# Patient Record
Sex: Male | Born: 1950 | Race: White | Hispanic: No | Marital: Married | Smoking: Never smoker
Health system: Southern US, Community
[De-identification: ages and names within clinical notes are randomized; demographics above are authoritative.]

## PROBLEM LIST (undated history)

## (undated) DIAGNOSIS — M1990 Unspecified osteoarthritis, unspecified site: Secondary | ICD-10-CM

## (undated) DIAGNOSIS — I10 Essential (primary) hypertension: Secondary | ICD-10-CM

## (undated) DIAGNOSIS — E119 Type 2 diabetes mellitus without complications: Secondary | ICD-10-CM

## (undated) DIAGNOSIS — K219 Gastro-esophageal reflux disease without esophagitis: Secondary | ICD-10-CM

## (undated) DIAGNOSIS — T508X5A Adverse effect of diagnostic agents, initial encounter: Secondary | ICD-10-CM

## (undated) DIAGNOSIS — I2510 Atherosclerotic heart disease of native coronary artery without angina pectoris: Secondary | ICD-10-CM

## (undated) DIAGNOSIS — H9311 Tinnitus, right ear: Secondary | ICD-10-CM

## (undated) DIAGNOSIS — E785 Hyperlipidemia, unspecified: Secondary | ICD-10-CM

## (undated) DIAGNOSIS — I219 Acute myocardial infarction, unspecified: Secondary | ICD-10-CM

## (undated) DIAGNOSIS — F319 Bipolar disorder, unspecified: Secondary | ICD-10-CM

## (undated) DIAGNOSIS — H9190 Unspecified hearing loss, unspecified ear: Secondary | ICD-10-CM

## (undated) HISTORY — DX: Unspecified osteoarthritis, unspecified site: M19.90

## (undated) HISTORY — DX: Essential (primary) hypertension: I10

## (undated) HISTORY — PX: TOTAL KNEE ARTHROPLASTY: SHX125

## (undated) HISTORY — DX: Atherosclerotic heart disease of native coronary artery without angina pectoris: I25.10

## (undated) HISTORY — PX: VASECTOMY: SHX75

## (undated) HISTORY — DX: Hyperlipidemia, unspecified: E78.5

## (undated) HISTORY — DX: Acute myocardial infarction, unspecified: I21.9

## (undated) HISTORY — DX: Adverse effect of diagnostic agents, initial encounter: T50.8X5A

## (undated) HISTORY — PX: JOINT REPLACEMENT: SHX530

---

## 2001-07-08 ENCOUNTER — Emergency Department (HOSPITAL_COMMUNITY): Admission: EM | Admit: 2001-07-08 | Discharge: 2001-07-08 | Payer: Self-pay | Admitting: Emergency Medicine

## 2001-07-08 ENCOUNTER — Encounter: Payer: Self-pay | Admitting: Emergency Medicine

## 2001-08-21 ENCOUNTER — Encounter: Payer: Self-pay | Admitting: Emergency Medicine

## 2001-08-22 ENCOUNTER — Inpatient Hospital Stay (HOSPITAL_COMMUNITY): Admission: EM | Admit: 2001-08-22 | Discharge: 2001-08-23 | Payer: Self-pay | Admitting: Emergency Medicine

## 2004-08-08 ENCOUNTER — Ambulatory Visit: Payer: Self-pay | Admitting: Internal Medicine

## 2005-08-17 ENCOUNTER — Ambulatory Visit (HOSPITAL_COMMUNITY): Admission: RE | Admit: 2005-08-17 | Discharge: 2005-08-17 | Payer: Self-pay | Admitting: Orthopaedic Surgery

## 2007-03-31 ENCOUNTER — Inpatient Hospital Stay (HOSPITAL_COMMUNITY): Admission: EM | Admit: 2007-03-31 | Discharge: 2007-04-03 | Payer: Self-pay | Admitting: Interventional Cardiology

## 2007-03-31 ENCOUNTER — Encounter: Payer: Self-pay | Admitting: Cardiology

## 2007-03-31 ENCOUNTER — Ambulatory Visit: Payer: Self-pay | Admitting: Cardiology

## 2007-04-03 ENCOUNTER — Encounter: Payer: Self-pay | Admitting: Cardiology

## 2008-03-16 ENCOUNTER — Inpatient Hospital Stay (HOSPITAL_COMMUNITY): Admission: RE | Admit: 2008-03-16 | Discharge: 2008-03-22 | Payer: Self-pay | Admitting: Orthopedic Surgery

## 2008-03-20 ENCOUNTER — Encounter (INDEPENDENT_AMBULATORY_CARE_PROVIDER_SITE_OTHER): Payer: Self-pay | Admitting: Orthopedic Surgery

## 2008-03-20 ENCOUNTER — Encounter: Payer: Self-pay | Admitting: Cardiology

## 2008-03-20 ENCOUNTER — Ambulatory Visit: Payer: Self-pay | Admitting: Vascular Surgery

## 2008-07-19 ENCOUNTER — Encounter: Payer: Self-pay | Admitting: Cardiology

## 2010-05-20 ENCOUNTER — Ambulatory Visit: Payer: Self-pay | Admitting: Internal Medicine

## 2010-05-29 ENCOUNTER — Encounter: Payer: Self-pay | Admitting: Cardiology

## 2010-05-29 ENCOUNTER — Emergency Department (HOSPITAL_COMMUNITY)
Admission: EM | Admit: 2010-05-29 | Discharge: 2010-05-29 | Payer: Self-pay | Source: Home / Self Care | Admitting: Emergency Medicine

## 2010-06-17 ENCOUNTER — Ambulatory Visit
Admission: RE | Admit: 2010-06-17 | Discharge: 2010-06-17 | Payer: Self-pay | Source: Home / Self Care | Attending: Internal Medicine | Admitting: Internal Medicine

## 2010-06-22 ENCOUNTER — Encounter: Payer: Self-pay | Admitting: Cardiology

## 2010-06-23 ENCOUNTER — Encounter: Payer: Self-pay | Admitting: Cardiology

## 2010-06-24 ENCOUNTER — Telehealth: Payer: Self-pay | Admitting: Cardiology

## 2010-07-03 ENCOUNTER — Telehealth (INDEPENDENT_AMBULATORY_CARE_PROVIDER_SITE_OTHER): Payer: Self-pay | Admitting: *Deleted

## 2010-07-03 NOTE — Cardiovascular Report (Signed)
Summary: Cardiac Catheterization  Cardiac Catheterization   Imported By: Dorise Hiss 06/24/2010 14:18:36  _____________________________________________________________________  External Attachment:    Type:   Image     Comment:   External Document

## 2010-07-03 NOTE — Letter (Signed)
Summary: [facility] H[facility]D/C DR. Beatrix Fetters [facility]  [facility]C DR. Beatrix Fetters [facility]   Imported By: Zachary George 06/24/2010 13:44:44  _____________________________________________________________________  External Attachment:    Type:   Image     Comment:   External Document

## 2010-07-03 NOTE — Letter (Signed)
Summary: Discharge Summary  Discharge Summary   Imported By: Dorise Hiss 06/24/2010 14:21:35  _____________________________________________________________________  External Attachment:    Type:   Image     Comment:   External Document

## 2010-07-03 NOTE — Consult Note (Signed)
Summary: Consultation Report/ CARDIOLOGY  Consultation Report/ CARDIOLOGY   Imported By: Dorise Hiss 06/24/2010 10:24:28  _____________________________________________________________________  External Attachment:    Type:   Image     Comment:   External Document

## 2010-07-03 NOTE — Progress Notes (Signed)
Summary: ASA question  Phone Note Call from Patient Call back at [facility] Phone [phone]   Summary of Call: Pt recently d/c'd from [facility]. He wanted to know if isosorbide was taking the place of Aspirin. Pt notified this would not take the place of Aspirin. He states he was on Aspirin 81mg  prior to going into the hospital. He states he was not told to continue this at d/c. He didn't know if he should take it or not since they don't know what's going on with him yet.   Records from [facility] will be scanned in for furthe review. Initial call taken by: Cyril Loosen, RN, BSN,  June 24, 2010 10:15 AM  Follow-up for Phone Call        Yes he should continue his prior dose of aspirin. He has known coronary artery disease. Follow-up by: Loreli Slot, MD, [facility],  June 24, 2010 10:29 AM     Appended Document: ASA question Pt notified and verbalized understanding.

## 2010-07-08 ENCOUNTER — Encounter: Payer: Self-pay | Admitting: Cardiology

## 2010-07-08 ENCOUNTER — Encounter (INDEPENDENT_AMBULATORY_CARE_PROVIDER_SITE_OTHER): Payer: Medicare Other | Admitting: Cardiology

## 2010-07-08 DIAGNOSIS — I10 Essential (primary) hypertension: Secondary | ICD-10-CM

## 2010-07-08 DIAGNOSIS — I2510 Atherosclerotic heart disease of native coronary artery without angina pectoris: Secondary | ICD-10-CM

## 2010-07-08 DIAGNOSIS — E782 Mixed hyperlipidemia: Secondary | ICD-10-CM

## 2010-07-09 NOTE — Progress Notes (Signed)
Summary: Med Question  Phone Note Call from Patient Call back at Home Phone [phone]   Summary of Call: Pt states he is going to have some [facility] and he would like to know if isosorbide is a blood thinner. Pt notified his Aspirin would be a blood thinner that the dentist may want him to hold. Pt verbalized understanding.  Initial call taken by: Cyril Loosen, RN, BSN,  July 03, 2010 2:43 PM   New Allergies: PCN * IVP DYE New/Updated Medications: TRAZODONE HCL 100 MG TABS (TRAZODONE HCL) Take 1 tablet by mouth at bedtime SINGULAIR 10 MG TABS (MONTELUKAST SODIUM) Take 1 tablet by mouth once a day. METOPROLOL TARTRATE 50 MG TABS (METOPROLOL TARTRATE) Take one tablet by mouth once daily. SIMVASTATIN 40 MG TABS (SIMVASTATIN) Take one tablet by mouth daily at bedtime XYZAL 5 MG TABS (LEVOCETIRIZINE DIHYDROCHLORIDE) Take 1 tab by mouth at bedtime ISOSORBIDE MONONITRATE CR 30 MG XR24H-TAB (ISOSORBIDE MONONITRATE) Take one tablet by mouth daily New Allergies: PCN * IVP DYE

## 2010-07-17 NOTE — Assessment & Plan Note (Signed)
Summary: eph d/c [facility] 1-24 chest pains   Visit Type:  Follow-up Primary Provider:  Dr. [name]   History of Present Illness: 60 year old male recently seen in consultation at [facility] in late January. He presented with chest pain and ruled out for myocardial infarction. Subsequent Cardiolite was performed, outlined below, and he was managed medically. He is establishing with our practice, previously followed by [facility] cardiology.  He is here with his wife today. He indicates no progressive chest pain. Reports compliance with his medications including Imdur. We reviewed his stress test results.  Today we discussed activity, diet and weight loss, also medication compliance.  Preventive Screening-Counseling & Management  Alcohol-Tobacco     Smoking Status: never  Current Medications (verified): 1)  Trazodone Hcl 100 Mg Tabs (Trazodone Hcl) .... Take 1 Tablet By Mouth At Bedtime 2)  Metoprolol Tartrate 50 Mg Tabs (Metoprolol Tartrate) .... Take One Tablet By Mouth Once Daily. 3)  Simvastatin 40 Mg Tabs (Simvastatin) .... Take One Tablet By Mouth Daily At Bedtime 4)  Xyzal 5 Mg Tabs (Levocetirizine Dihydrochloride) .... Take 1 Tab By Mouth At Bedtime 5)  Isosorbide Mononitrate Cr 30 Mg Xr24h-Tab (Isosorbide Mononitrate) .... Take One Tablet By Mouth Daily 6)  Aspir-Low 81 Mg Tbec (Aspirin) .... Take 1 Tablet By Mouth Once A Day 7)  Dexilant 60 Mg Cpdr (Dexlansoprazole) .... Take 1 Tablet By Mouth Once A Day 8)  Hydrocodone-Acetaminophen 5-325 Mg Tabs (Hydrocodone-Acetaminophen) .... Take 1 Tablet By Mouth Two Times A Day As Needed  Allergies (verified): 1)  Pcn 2)  * Ivp Dye  Comments:  Nurse/Medical Assistant: The patient's medication bottles and allergies were reviewed with the patient and were updated in the Medication and Allergy Lists.  Past History:  Past Medical History: Last updated: 06/24/2010 Hyperlipidemia Hypertension Osteoarthritis Myocardial Infarction - IMI  2008 CAD - BMS to RCA 2008, [facility], Dr. Katrinka Blazing Contrast dye allergy  Past Surgical History: Last updated: 06/24/2010 Vasectomy Left TKR  Family History: Last updated: 06/24/2010 Cardiovascular disease noted in both parents  Social History: Last updated: 06/24/2010 Disabled - arthritis Married  Tobacco Use - No.   Review of Systems       The patient complains of dyspnea on exertion.  The patient denies anorexia, fever, weight loss, chest pain, syncope, peripheral edema, prolonged cough, hemoptysis, melena, and hematochezia.         Reports some early morning "phlegm" production, no orthopnea or PND. Chronic problems with knee pain related to arthritis. Otherwise reviewed and negative except as outlined.  Vital Signs:  Patient profile:   60 year old male Height:      71 inches Weight:      254 pounds BMI:     35.55 Pulse rate:   83 / minute BP sitting:   133 / 79  (left arm) Cuff size:   large  Vitals Entered By: Carlye Grippe (July 08, 2010 11:03 AM)  Nutrition Counseling: Patient's BMI is greater than 25 and therefore counseled on weight management options.  Physical Exam  Additional Exam:  Morbidly obese male in no acute distress. HEENT: Conjunctiva and lids normal, oropharynx with poor dentition. Neck: Supple, no elevated JVP or bruits. Lungs: Clear to auscultation, diminished breath sounds, nonlabored. Cardiac: Regular rate aythm, soft S4, no significant murmur. Abdomen: Obese, nontender, bowel sounds present. Skin: Warm and dry. Extremities: Trace ankle edema, distal pulses one plus. Musculoskeletal: No kyphosis. Neuropsychiatric: Alert and oriented x3, affect appropriate.   Prior Report Reviewed for  Nuclear Study:  Findings: 06/23/2010 Lexiscan Cardiolite, no chest pain, no diagnostic ST segment changes, hypertensive response. Fixed inferobasal defect most consistent with diaphragmatic attenuation, LVEF 51%, t.i.d. ratio 1.1, no clear  evidence of ischemia.  Impression & Recommendations:  Problem # 1:  CORONARY ATHEROSCLEROSIS NATIVE CORONARY ARTERY (ICD-414.01)  Symptomatically stable at this point on medical therapy. Recent cardiac testing reviewed. Continue symptom observation, diet and weight loss recommended. Activity as tolerated with arthritic limitations. Followup in 6 months.  His updated medication list for this problem includes:    Metoprolol Tartrate 50 Mg Tabs (Metoprolol tartrate) .Marland Kitchen... Take one tablet by mouth once daily.    Isosorbide Mononitrate Cr 30 Mg Xr24h-tab (Isosorbide mononitrate) .Marland Kitchen... Take one tablet by mouth daily    Aspir-low 81 Mg Tbec (Aspirin) .Marland Kitchen... Take 1 tablet by mouth once a day  Problem # 2:  MORBID OBESITY (ICD-278.01)  Diet, exercise, and weight loss discussed.  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 Sherryll Burger. Recommend aggressive LDL control around 70.  His updated medication list for this problem includes:    Simvastatin 40 Mg Tabs (Simvastatin) .Marland Kitchen... Take one tablet by mouth daily at bedtime  Problem # 4:  ESSENTIAL HYPERTENSION, BENIGN (ICD-401.1)  No changes made in present medical regimen.  His updated medication list for this problem includes:    Metoprolol Tartrate 50 Mg Tabs (Metoprolol tartrate) .Marland Kitchen... Take one tablet by mouth once daily.    Aspir-low 81 Mg Tbec (Aspirin) .Marland Kitchen... Take 1 tablet by mouth once a day  Patient Instructions: 1)  Your physician recommends that you continue on your current medications as directed. Please refer to the Current Medication list given to you today. 2)  Follow up in  6 months

## 2010-08-11 LAB — COMPREHENSIVE METABOLIC PANEL
AST: 45 U/L — ABNORMAL HIGH (ref 0–37)
GFR calc Af Amer: >60 mL/min (ref >60)
Glucose, Bld: 104 mg/dL — ABNORMAL HIGH (ref 70–99)
Sodium: 141 mEq/L (ref 135–145)
Total Bilirubin: 0.6 mg/dL (ref 0.3–1.2)
Total Protein: 6.9 g/dL (ref 6.0–8.3)

## 2010-08-11 LAB — LIPASE, BLOOD: Lipase: 24 U/L (ref 11–59)

## 2010-08-11 LAB — CBC
HCT: 42.3 % (ref 39.0–52.0)
Hemoglobin: 14.8 g/dL (ref 13.0–17.0)
MCHC: 35 g/dL (ref 30.0–36.0)
MCV: 77.2 fL — ABNORMAL LOW (ref 78.0–100.0)
Platelets: 162 10*3/uL (ref 150–400)
RBC: 5.48 MIL/uL (ref 4.22–5.81)
RDW: 13.6 % (ref 11.5–15.5)

## 2010-08-11 LAB — DIFFERENTIAL
Eosinophils Absolute: 0 10*3/uL (ref 0.0–0.7)
Lymphs Abs: 1.7 10*3/uL (ref 0.7–4.0)
Monocytes Relative: 8 % (ref 3–12)

## 2010-10-14 NOTE — H&P (Signed)
NAME:  Michael Rose, Michael Rose NO.:  [id number]   MEDICAL RECORD NO.:  [id number]          PATIENT TYPE:  OBV   LOCATION:  2853                         FACILITY:  [facility]   PHYSICIAN:  Lyn Records, M.D.   DATE OF BIRTH:  May 03, 1951   DATE OF ADMISSION:  03/31/2007  DATE OF DISCHARGE:                              HISTORY & PHYSICAL   The patient is FULL CODE.   Patient was the historian.  He was a good historian.   CHIEF COMPLAINT:  He is being transferred from [facility] ER with chest  pain, possibly inferior MI.   HISTORY OF PRESENT ILLNESS:  Michael Rose is a 60 year old man with a  history of hypertension and gastroesophageal reflux disease with no  known coronary artery disease who noticed severe chest pain near 10/10  approximately 6:30 p.m. associated with shortness of breath, nausea and  vomiting, and radiation to the left arm, and he also had some  diaphoresis.  The patient thought it was reflux and did not seek medical  attention initially.  The chest pain continued, however, and his wife  took him to the ER in [place] apparently around 9 p.m.  On arrival,  patient had an EKG consistent possibly with an acute myocardial inferior  ST elevation and was given aspirin 325, Lopressor 5 IV x3, nitro drip,  and heparin bolus 5000 and a drip at 1000 units per hour.  The patient  had persistent chest pain in the ER with chest pain decreasing only to  the range of 6-7/10.  Patient was urgently transferred to [facility].  The patient notes persistent shortness of breath as well. He notes 7/10  chest pain at time of interview.   PAST MEDICAL HISTORY:  The patient has never had a prior cardiac  catheterization, unsure of the ejection fraction, never had a coronary  artery bypass grafting.  He has a history of:  1. Depression, not otherwise specified.  2. History of anxiety.  3. History of seasonal allergies.  4. History of hypertension.  5. A history of degenerative  joint disease with bilateral knee pain      resulting in disability.  6. A history of gastroesophageal reflux disease.  7. He is status post vasectomy.   MEDICATIONS:  He notes he is on pain medicines, but he is unsure of the  other medicines that he is on, and he is unsure of the pain medicine  type.   ALLERGIES:  He has  CONTRAST ALLERGY with a delayed rash.  PENICILLIN  causes a rash.   SOCIAL HISTORY:  The patient is married, currently disabled, a history  of tobacco abuse none currently.  He was employed at [employer].   FAMILY HISTORY:  Positive for coronary artery disease in the mother and  the father.   REVIEW OF SYSTEMS:  14-point review of systems negative unless it is  stated above.   PHYSICAL EXAMINATION:  VITAL SIGNS:  Blood pressure is 125/84 with a  pulse of 73, respiratory rate of 18, temperature 97.4 with saturations  100% on 2 L nasal  cannula.  GENERAL:  He is a male in mild acute distress.  HEENT:  Normocephalic, atraumatic.  His pupils are equal, round, and  reacted to light with extraocular movements intact.  His sclerae is  clear.  The oropharynx shows no posterior oropharyngeal lesions.  NECK:  Supple with no lymphadenopathy or thyromegaly.  No jugular venous  distention or bruits.  No anterior lymphadenopathy.  ABDOMEN:  Soft, nontender, nondistended with no hepatosplenomegaly.  LUNGS:  Clear to auscultation bilaterally with no wheezes or rhonchi.  There is poor inspiratory effort, however.  SKIN:  No rashes or lesions.  NEUROLOGIC EXAM:  He is alert and oriented x4.  Cranial nerves II  through XII grossly intact.  Strength and sensation grossly intact.  EXTREMITIES:  No clubbing, cyanosis, or edema.  Dorsalis pedis on  vascular exam are 2+ bilaterally.   LABORATORY DATA:  The outside hospital showed a white count of 12.9 with  a hemoglobin of 60,1, hematocrit of 46.7, platelets 262.  Chest x-ray  showed cephalization and poor inspiratory effort.   His EKG showed on 2  occasions, one on 2107 and 1922, heart rates of 65 and 74, respectively,  and normal sinus rhythm at normal axis, PR interval in the range of  140s, QRS in the 90s, and QT corrected in the 400s in the range of 410.  The patient had 3 to 4-mm ST elevations in II, III, and aVF with  reciprocal changes in I and aVL and also reciprocal changes in V1 to V2.  The patient's other labs are pending.   ASSESSMENT AND PLAN:  This is a patient with a history of tobacco abuse  and coronary artery disease family history who presented with an  inferior acute ST elevations with no signs of infarct as of yet with  persistent chest pain.  He is being currently set up for emergent  cardiac catheterization with possible coronary intervention.  He would  likely be on a 2b3a inhibitor, statin therapy, and beta blocker therapy,  and he will have an ejection fraction assessment with possible need for  an angiotensin-converting enzyme inhibitor.  The patient has  discontinued tobacco use and does have a history of tobacco use in the  past.  The patient will be monitored in the coronary care unit.      Darryl D. Prime, MD   Electronically Signed     ______________________________  Lyn Records, M.D.    DDP/MEDQ  D:  03/31/2007  T:  04/01/2007  Job:  [id number]

## 2010-10-14 NOTE — Cardiovascular Report (Signed)
NAME:  Michael Rose, Michael Rose NO.:  [id number]   MEDICAL RECORD NO.:  [id number]          PATIENT TYPE:  INP   LOCATION:  2910                         FACILITY:  [facility]   PHYSICIAN:  Lyn Records, M.D.   DATE OF BIRTH:  12-31-1950   DATE OF PROCEDURE:  03/31/2007  DATE OF DISCHARGE:                            CARDIAC CATHETERIZATION   This is a cardiac catheterization on emergency percutaneous intervention  procedure note.   INDICATIONS:  Acute inferior STEMI starting at 6:30 p.m. on the date of  admission.   PROCEDURES PERFORMED:  1. Left heart cath.  2. Selective coronary angio.  3. Left ventriculography.  4. PCI with a bare-metal stent mid right coronary.   DESCRIPTION:  After informed consent, the patient was brought directly  from the ambulance dock to the cath lab.  He had been transported from  [place].  He started having chest pain at 6:30 p.m.  In the cath lab, a  quick history was obtained.  It was identified that he had skin eruption  with contrast.  He was premedicated with IV Solu-Medrol 100 mg, 50 mg of  IV Benadryl, and 20 mg of IV Pepcid.   We then gave the patient instruction concern the procedure and he  consented to proceed.  After consent, we gave the patient some Versed  and fentanyl for pain control.   We then used 1% Xylocaine in the right femoral to obtain access in the  artery using the modified Seldinger technique.  A 6-French sheath was  placed.  A 6-French A2 multipurpose catheter was then used for  hemodynamic recordings, left ventriculography by hand injection,  selective left and right coronary angiography.  We identified the right  coronary as the acute culprit vessel.   Angiomax bolus and infusion was then begun.  A bolus was 0.75 mg/kg IV  over 2 minutes followed by the standard infusion of Angiomax.  The  patient was loaded with 600 mg of oral Plavix.  The patient already had  been given aspirin.   We then used a 6-French  right coronary guide catheter.  We crossed the  lesion with the wire quite easily.  We then used the Fetch catheter in  an attempt to perform mechanical thrombectomy.  Flow was established  after the thrombectomy catheter was advanced into the lesion and  removed.  One run was made.   We then ballooned the lesion with a 2.5 x 15 Maverick balloon after  documenting the ACT was greater than 200.  We then placed an 18 x 3.0  Vision chromium-cobalt stent and deployed to 15 atmospheres.  We  postdilated with a 3.5 x 15 Quantum to 14 atmospheres.  Two balloon  inflations were performed.  A nice angiographic result was obtained.  TIMI grade 3 flow was noted.   Immediately upon opening the vessel with the thrombectomy catheter, the  patient did develop Bezold-Jarisch reflex, for which 1 mg of IV atropine  was administered and IV fluid boluses were given to return this pressure  to around 100 systolic.   The case was  terminated.  Manual pressure will be used to achieve  hemostasis later this evening.  We will continue Angiomax for an  additional hour and half postprocedure while we await Plavix absorption  to give [place] reasonable antiplatelet activity.   RESULTS:  1. Hemodynamic data:      a.     The left ventricular pressure is 128/816.      b.     Aortic pressure 126/82.  2. Left ventriculography:  The left ventricular cavity is mildly      dilated.  Moderate inferior hypokinesis is noted.  EF is 55%.  3. Coronary angiography.      a.     Left main coronary:  Widely patent with minimal luminal       irregularities.      b.     Left anterior descending coronary:  This is a large vessel       that is ectatic and wraps around left ventricular apex.  It gives       origin to first diagonal that contains irregularities and a larger       second diagonal that supplies much of the anterolateral wall.       Minimal luminal irregularities are noted in the proximal and mid       LAD and diagonals  but no high-grade obstruction.      c.     Circumflex artery:  Gives origin to obtuse marginals.  The       second obtuse marginal is large and bifurcates.  Irregularities       are noted in the first and second marginals but no stenoses       greater than 50%.      d.     Right coronary:  The right coronary is large vessel that is       totally occluded in the mid vessel with TIMI grade 0 flow beyond       the total occlusion.  4. PCI report:  The right coronaries reduced from 100% to 0% with TIMI      grade 3 flow after placing a bare-metal Vision chromium-cobalt      stent and dilating it to 3.5 mm.  No complications occurred.  Post      procedure ACT was greater than 300 seconds.   CONCLUSION:  1. Acute inferior wall STEMI due to right coronary total occlusion.  2. Successful recanalization of the right coronary with reduction of      stenosis from 100% to 0%.  3. Luminal irregularities of the left coronary system.  4. Inferior wall moderate hypokinesis.   PLAN:  Aspirin, Plavix, statin, ACE inhibitor therapy will be continued,  potassium replacement will be given.      Lyn Records, M.D.  Electronically Signed     HWS/MEDQ  D:  03/31/2007  T:  04/01/2007  Job:  [id number]

## 2010-10-14 NOTE — Op Note (Signed)
NAME:  Michael Rose, Michael Rose NO.:  [id number]   MEDICAL RECORD NO.:  [id number]          PATIENT TYPE:  INP   LOCATION:  5029                         FACILITY:  [facility]   PHYSICIAN:  Dyke Brackett, M.D.    DATE OF BIRTH:  18-Jun-1950   DATE OF PROCEDURE:  03/16/2008  DATE OF DISCHARGE:                               OPERATIVE REPORT   INDICATIONS:  This is a 60 year old with severe varus arthrosis of the  left knee, thought to be amenable to total knee replacement.   PREOPERATIVE DIAGNOSIS:  Osteoarthritis, left knee.   POSTOPERATIVE DIAGNOSIS:  Osteoarthritis, left knee.   OPERATION:  Left total knee replacement (LCS large patella, large femur,  three-peg patella, size 4 tibia with 10-mm bearing.   SURGEON:  Dyke Brackett, MD   ASSISTANT:  Sharol Given, PA   TOURNIQUET TIME:  1 hour 30 minutes.   PROCEDURE:  Sterile prep and drape, exsanguination of leg and inflation  to 375, straight skin incision with medial parapatellar approach to the  knee.  Stripping of the medial compartment and medial side of the knee  was carried out due to a varus knee with a flexion contracture.  Tibia  was cut about 2 mm below the most diseased medial compartment followed  by an anterior-posterior femoral cut with flexion gap at 10 mm, distal  femoral 4-degree valgus cut followed by chamfer cuts in the keel hole  for the prosthesis.  Attention was next directed at the tibia.  The  flexion gap did match the extension gap at 10.  The tibia was sized to  be a size 4 tibia.  Small osteophytes were removed from the medial side  of the knee.  Carolin Guern[place] was cut for the tibia followed by placement of the  trial, tibia and femur.  The patella was cut leaving about 16 mm of  native patella with a three-peg trial.  Trial of the 10 tracked nicely.  There was extensive stripping of the medial side of the knee due to the  varus deformity, but with the medial sleeve intact with 2 towel clips.  There was  full extension, good stability, and flexion and no tendency  for bearings thin out.  Trial components were removed.  Final components  were inserted with exception of the bearing.  Trial bearing was placed  after the final components were inserted tibia femur and patella.  Excess cement was removed.  Trial bearing was removed.  Tourniquet was  released.  No excess bleeding was noted in the back of the knee.  No  excess cement was noted.  The final bearing was inserted 10 mm bearing.  Again, all parameters were deemed to be  acceptable was stability and range of motion, and the closure was  effected with #1 Ethilon 2-0 Vicryl and skin clips.  Hemovac drain was  placed exiting superolaterally, placed in lateral gutter and the patient  also had a preoperative femoral nerve block.      Dyke Brackett, M.D.  Electronically Signed     WDC/MEDQ  D:  03/16/2008  T:  03/16/2008  Job:  [id number]

## 2010-10-14 NOTE — Discharge Summary (Signed)
NAME:  Michael Rose, Michael Rose NO.:  [id number]   MEDICAL RECORD NO.:  [id number]          PATIENT TYPE:  INP   LOCATION:  5029                         FACILITY:  [facility]   PHYSICIAN:  Dyke Brackett, M.D.    DATE OF BIRTH:  12/17/1950   DATE OF ADMISSION:  03/16/2008  DATE OF DISCHARGE:                               DISCHARGE SUMMARY   ADMISSION DIAGNOSIS:  Left knee osteoarthritis.   FINAL DISCHARGE DIAGNOSES:  Left knee osteoarthritis status post left  total knee replacement.   PROCEDURE:  The patient was admitted to the hospital on March 16, 2008  with left knee osteoarthritis and taken to the operating room for a left  total knee replacement.  The procedure went well with no complications.  Estimated blood loss about 300 mL.  The patient was then transferred to  the PACU in stable condition and transferred up to the 5004.   HOSPITAL COURSE:  Postop day #1 was on March 17, 2008.  The patient's  pain controlled.  Temperature max of 100.  Vital signs were stable.  Patient doing well following left total knee replacement.  Postop day #2  was on March 18, 2008.  The patient's pain was still controlled.  Afebrile.  Vital signs were stable.  Hemoglobin was at 11.  Drain was  pulled.  The PCA pump was discharged.  The patient continued with  physical therapy.  Postoperative day #3 was on March 19, 2008.  The  patient's vital signs were stable and he was afebrile.  Hemoglobin 10.7,  hematocrit 31.2, white blood cell  8.3, platelets 150.  A BMP was within  normal limits.  The patient's plan is to go to skilled nursing facility,  working on that.  He is having some difficulty urinating, therefore we  ordered a urinalysis which did come back as negative.  Otherwise,  continued in CPM 0-90 at 6-8 hours per day.  Postoperative day #4 was on  March 20, 2008.  It was noted the patient had temperature max of 100.  Ordered a chest x-ray which came back negative for any signs  of a  pneumonia.  The patient was not having any cough. It is noted on exam  the patient having a fair amount of bruising and swelling of the left  lower extremity.  Therefore, we looked to get a left lower extremity  venous Doppler ultrasound to help out any other potential source of  fever.  Reviewed his knee incision on the left side and it was noted  there was some mild redness of a suture reaction.  No purulent drainage  and no signs of erythema or cellulitis.  Hemoglobin was 10.6, hematocrit  31.6, white count was 8.1 stable with 8.3 yesterday, platelets 182.  BMP  within normal limits, except for 122 for blood sugar.  Blood pressure  was 148/67.  Oxygen saturation 97% on room air.  Heart and lungs were  clear and regular rate and rhythm.  Lower extremity exam, neurovascular  intact.   Plan for discharge to skilled nursing facility pending negative Doppler  ultrasound.  Plan for discharge either March 20, 2008 if bed available  after negative Doppler ultrasound versus March 21, 2008.   ASSESSMENT/PLAN:  The patient is status post left total knee  replacement, postoperative day #4.  Plan for postoperative day #14  removal of staples.  The patient will stay at 50% weightbearing on that  left lower extremity at skilled nursing facility.  Plan to stay on  Lovenox for a total of 14 days postoperative.  He should be getting 40  mg subcutaneous at 8 a.m. each day.  The patient is in stable condition  on discharge.  The patient should have a follow up 2 weeks from now  outpatient in the office with Dyke Brackett, M.D.  The number is [phone] to make an appointment.   DISCHARGE MEDICATIONS AS FOLLOWS:  1. Percocet 5/325 mg one to two tablets every 4-6 hours p.r.n. pain.  2. Lovenox 40 mg subcutaneous at 8 a.m. each day with a plan for      needing another 11 doses from today's date, March 20, 2008.  3. Robaxin 500 mg one tab every 6-8 hours p.r.n. pain, muscle spasm.  4.  Metoprolol 50 mg one tab b.i.d.  5. Pravastatin 40 mg two tabs daily.  6. Trazodone 50 mg daily.  7. Aciphex 20 mg b.i.d.  8. Sublingual nitroglycerin p.r.n.   FINAL DIAGNOSES:  Left knee osteoarthritis status post left total knee  replacement.   Pending negative Doppler ultrasound plan discharge to skilled nursing  facility today of March 20, 2008.      Sharol Given, PA      Dyke Brackett, M.D.  Electronically Signed    JBS/MEDQ  D:  03/20/2008  T:  03/20/2008  Job:  [id number]

## 2010-10-17 NOTE — Consult Note (Signed)
[facility]  Patient:    Michael Rose, Michael Rose Visit Number: [id number] MRN: [id number]          Service Type: OBV Location: 3A [id number] 01 Attending Physician:  Michael Rose Dictated by:   Michael Rose, M.D. Proc. Date: 08/21/01 Admit Date:  08/21/2001   CC:         Michael Rose, Michael Rose, Michael Rose, Michael Rose.  Michael Rose, M.D.   Consultation Report  REASON FOR CONSULTATION:  Nausea, vomiting and diarrhea.  HISTORY OF PRESENT ILLNESS:  The patient is a pleasant 60 year old gentleman followed primarily by Michael Rose, [facility] Rose, Michael Rose, Michael Rose, who I have seen in consultation recently for gastroesophageal reflux disease. He was really doing very well until about 3 days prior to admission when he developed diffuse abdominal pain, nausea, vomiting, and diarrhea.  He has had numerous episodes of nonbloody, watery, diarrhea since August 19, 2001.  He has felt feverish but no frank chills or fever documented.  He says he felt terrible for the past 2 days.  He called me earlier this morning.  I recommended he go the [facility] emergency department for evaluation.  Because of his insurance status he felt it would be  better to go the [facility] emergency department.  He was evaluated by [facility], and called me for further evaluation.  In the emergency department earlier this morning he was noted to have diffuse abdominal pain and having multiple episodes of watery, nonbloody, diarrhea. Acute abdominal series demonstrated some dilated loops of small bowel with a nonspecific appearance.  [facility] was impressed with this diffuse abdominal pain and we decided to get an abdominal CT which revealed some thickening of the jejunal segment of small bowel, otherwise no significant abnormality.  The patient has not travelled anywhere recently.  No one in his family is ill. They went out to eat earlier in the week but he does not recall any  details, no unusual food intake.  [place] are on a city water [place].  He has never had a similar illness previously.  He generally moves his bowels daily or every other day.  LABORATORY EVALUATION:  Sodium 136, potassium 3.8, chloride 101, CO2 25, carbon dioxide 25, glucose 125, BUN 14, creatinine 0.9, calcium 9.0, total protein 6.9, albumin 3.5.  GGT and GGTP 18 and 19 respectively.  Alk. phos. 56, and total bilirubin 0.7.  Michael count 6.0, ______ 14.8 and 40.8.  MCV 76.5, platelet 233,000, 61% neutrophils, no eosinophils, 17% monocytes, slightly elevated.  Lipase 26.  Amylase 51.  In the emergency department he has been stable.  He received some Levsin sublingual 12.5 mg and Phenergan IV which was associated with marked improvement in his symptoms.  A fresh stool sample has been submitted to the lab for microbiology studies.  Again, the patient was seen by me earlier this year for "regurgitation" which was felt to be gastroesophageal reflux disease.  His symptoms dramatically improved on a course of aciphex 20 mg orally daily.  He underwent an EGD and screening colonoscopy on August 04, 2001.  Both of these examinations were normal.  Prior CT on July 25, 2001, demonstrated mild cardiomegaly, pulmonary vascular prominence, slight nodularity of the gallbladder which could be volume averaging (this study was done at [facility] on July 25, 2001).  PAST MEDICAL HISTORY 1. Significant for depression and anxiety and neurosis. 2. Seasonal allergies. 3. Hypertension.  PAST SURGICAL HISTORY:  Vasectomy.  CURRENT MEDICATIONS: 1. Aciphex 20  mg orally daily. 2. Paxil 20 mg 1/2 tablet daily. 3. Lisinopril 10 mg daily.  ALLERGIES: 1. PENICILLIN, rash. 2. ISO-B 300, HIVES/URTICARIA.  FAMILY HISTORY:  Father is in his 46s and he is hospitalized with heart problems, currently at [facility].  Mother has a history of heart disease.  No history of chronic GI illness, or  colorectal neoplasia.  SOCIAL HISTORY:  The patient has been married for 9 years. There are no children.  Employed with [employer].  No tobacco, no alcohol.  REVIEW OF SYSTEMS:  No chest pain, no dyspnea, no fever, no chills, no recent weight loss.  PHYSICAL EXAMINATION:  GENERAL:  Reveals a washed out appearing 60 year old gentleman somewhat disheveled, accompanied by his wife.  VITAL SIGNS:  Temperature 98.2, respiratory rate 20, blood pressure 107/73  SKIN:  Warm and dry and there is no jaundice.  Turgor is fair.  HEENT:  No scleral icterus.  Conjunctivae are pink.  Oral cavity:  No lesions. JVD is not prominent.  CHEST:  Lungs clear to auscultation.  CARDIAC:  Regular rate and rhythm without murmur, gallop, or rub.  ABDOMEN:  Full.  He has bowel sounds.  He has really minimal tenderness to palpation.  No appreciable mass or organomegaly.  EXTREMITIES:  No edema.  CVA TENDERNESS:  None.  IMPRESSION:  The patient is a pleasant 60 year old gentleman with an acute illness characterized by a 3 day history of nausea, vomiting, diarrhea, diffuse abdominal cramping pain.  His course thus far in the emergency department has been quite minimal and one of stability and improvement.  After receiving Levsin and Phenergan he is feeling much better.  However, I do feel that he is dehydrated and needs further Rose management of his nausea, vomiting and diarrhea.  RECOMMENDATIONS: 1. A 24-hour observation on my service. 2. IV fluid therapy. 3. IM antiemetics and sublingual Levsin as needed. 4. Clear liquid diet. 5. Follow up on stool studies. 6. Will get him IV protonix and recheck his electrolytes tomorrow morning.  The etiology of his symptoms are likely secondary to a food borne bacterial illness, although a viral illness is not excluded.   The scenario appears to be much less likely some other process such as mesenteric ischemia given his marked improvement with  sublingual Levsin and Phenergan.  Further recommendations to follow. Dictated by:   Michael Rose, M.D. Attending Physician:  Michael Rose DD:  08/21/01 TD:  08/22/01 Job: [id number]

## 2010-10-17 NOTE — Discharge Summary (Signed)
NAMEJAYVON, [name] NO.:  [id number]   MEDICAL RECORD NO.:  [id number]          PATIENT TYPE:  INP   LOCATION:  3701                         FACILITY:  [facility]   PHYSICIAN:  Lyn Records, M.D.   DATE OF BIRTH:  1950/12/01   DATE OF ADMISSION:  03/31/2007  DATE OF DISCHARGE:  04/03/2007                               DISCHARGE SUMMARY   DISCHARGE DIAGNOSES:  1. 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  2. Hypertension, treated.  3. Severe bilateral knee degenerative joint disease.  4. Gastroesophageal reflux disease.  5. History of depression.  6. History of anxiety.   HOSPITAL COURSE:  Michael Rose is a 60 year old male patient who was  transferred from [facility] emergency room with an acute inferior  myocardial infarction.  He developed chest pain at around 6:30 p.m. on  the evening of admission and did not go to the emergency room right  away.  He apparently waited 3 hours before he sought medical help.  He  was found to have ST-segment elevation in the inferior leads and was  brought to the cardiac catheterization lab emergently.  Through  documentation, it appears that he arrived at [facility] around 10:00  p.m. and was taken to the cath lab emergently.  He was found to have the  following:  Left main normal, LAD no obstructive disease, circumflex  nonobstructive, RCA with obstructed in the midvessel with no distal  flow.  Bare metal stent was placed to this lesion, resulting in TIMI III  flow.  He was watched in the hospital for the next several days and by  April 03, 2007, he was felt to be ready for discharge to home.  During  his hospitalization, lab work showed white count 12.1, which was felt to  be secondary to his myocardial infarction, hemoglobin 14.6, hematocrit  44.3, platelets 205, maximum CK 831 with an MB fraction of 143.2,  troponin of 11.70.  Total cholesterol 249,  triglycerides 120, HDL 54,  LDL 171.  The chart through my review does not show another 12-lead EKG,  but I do see the one from [facility] and this clearly shows ST-  segment elevation in the inferior leads, 3 mm.   DISCHARGE MEDICATIONS:  1. Plavix 75 mg a day.  2. Enteric-coated aspirin 325 mg a day.  3. Lisinopril 5 mg a day.  4. Metoprolol ER 25 mg a day.  5. Simvastatin 80 mg a day.  6. Sublingual nitroglycerin p.r.n. chest pain.   Remain on a low-sodium heart-healthy diet.  Increase activity slowly.  Cardiac rehabilitation instructions were provided to the patient.  The  patient is to follow up with Dr. Katrinka Blazing on April 15, 2007 at 10:00  a.m.  He is to call for any questions or concerns.      Michael Rose, P.A.      Lyn Records, M.D.  Electronically Signed    LB/MEDQ  D:  05/05/2007  T:  05/05/2007  Job:  [id number]   cc:  Francisca December, M.D.  Lyn Records, M.D.

## 2010-10-17 NOTE — Discharge Summary (Signed)
[facility]  Patient:    Michael Rose, Michael Rose Visit Number: [id number] MRN: [id number]          Service Type: MED Location: 3A A339 01 Attending Physician:  Jonathon Bellows Dictated by:   Tana Coast, P.A. Admit Date:  08/21/2001 Disc. Date: 08/23/01   CC:         Dr. Eldridge Dace, M.D.   Discharge Summary  ADMITTING DIAGNOSIS:  Nausea, vomiting and diarrhea.  DISCHARGE DIAGNOSES: 1. Acute gastroenteritis, possibly food born illness versus viral, resolved    with symptomatic therapy. 2. Low mean corpuscular volume in setting of normal hemoglobin and hematocrit.    Labs pending to rule out iron deficiency.  HISTORY OF PRESENT ILLNESS:  The patient is a 60 year old gentleman followed primarily by Dr. Clelia Croft at [facility] in [place], [place].  He was recently seen by Dr. [name] in the office for gastroesophageal reflux disease.  He was doing very well until three days prior to admission when he developed diffuse abdominal pain, nausea, vomiting and diarrhea.  He had numerous episodes of nonbloody, watery diarrhea for two days prior to admission.  He felt feverish, but there were no frank chills or fever documented.  He underwent an EGD and screening colonoscopy on August 04, 2001. Both these examinations were normal.  A CT on July 25, 2001, demonstrated mild cardiomegaly, pulmonary vascular prominence, slight nodularity of the gallbladder which could be volume averaging (this study was done at [facility]).  The patient was begun on Aciphex 20 mg daily which helped dramatically with his gastroesophageal reflux disease.  In the emergency department, he was noted to have diffuse abdominal pain and multiple episodes of water, nonbloody diarrhea.  Acute abdominal series demonstrated some dilated loops of small bowel with nonspecific appearance. Abdominal CT was also obtained which revealed some thickening of the  jejunal segment of the small bowel, but otherwise no significant abnormality.  The patient has not travelled anywhere recently.  No one in his family is ill.  He had eaten out earlier in the week, however, could not recall any details.  He is on city water system.  He has never had any similar illness previously.  He generally moves his bowels every other day.  HOSPITAL COURSE:  In the emergency department, he had acute abdominal series and CT as described above.  Laboratory data revealed a white count of 6.0, hemoglobin 14.8, hematocrit 40.8, MCV 76.5, platelets 233,00.  Sodium was 136, potassium 3.8, chloride 101, glucose 125, BUN 14, creatinine 0.9.  Total bilirubin 0.7, albumin 3.5, SGOT 18, SGPT 19, Alk phos 56, amylase 51, lipase 26.  Urinalysis was hazy and revealed trace ketones and trace protein with a few bacteria.  In the ED, he received some Levsin sublingual and Phenergan IV with marked improvement of his symptoms.  Stools were collected for studies. It was felt that he had some dehydration and needed further hospital management of his symptoms, therefore, he was initially placed on 24-hour observation.  He was maintained on Levsin sublingual, Phenergan 25 mg IM q.6h. p.r.n., IV normal saline, 20 mEq of Kay Ciel per liter at 150 cc per hour, Protonix 40 mg IV q.24h., Imodium 2 mg p.o. q.i.d. p.r.n., Claritin 10 mg p.o. q.d. and lisinopril 10 mg p.o. q.d.  The following morning, he had began to feel better, however had continued to have watery diarrhea through the early morning hours.  Nausea and vomiting had resolved.  He was  tolerating a diet, but felt that he needed to remain in the hospital at least for another day. He was therefore made a full admission.  He was suspected to have acute gastroenteritis secondary to food born versus viral illness.  His diet was advanced and his IV fluids were decreased.  On day #2 of hospitalization, the patient was feeling much better  except for mild morning nausea.  He was tolerating a full diet with no vomiting.  He had had no bowel movement for more than 36 hours.  He was feeling well and ready to go home.  Stool for C. difficile, O&P and WBC was negative.  Stool cultures were being reincubated for better growth.  Because of a low MCV, a fasting serum ferritin and transferrins were ordered, however, results were not available at the time of discharge.  DISCHARGE PHYSICAL EXAMINATION:  VITAL SIGNS:  Temperature 97.9, pulse 63, respiratory rate 20, blood pressure 112/74.  SKIN:  Warm and dry with no jaundice.  CHEST:  Lungs clear to auscultation.  CARDIAC:  Regular rate and rhythm with normal S1, S2 with no murmurs, rubs or gallops.  ABDOMEN:  Positive bowel sounds, soft, nontender, nondistended.  DISCHARGE LABORATORY DATA AND X-RAY FINDINGS:  As mentioned above, serum ferritin and transferrin are pending at the time of discharge.  CONDITION ON DISCHARGE:  Good.  DISPOSITION:  Discharged to home.  DISCHARGE MEDICATIONS: 1. Zyrtec 10 mg p.o. q.d. 2. Paxil 10 mg p.o. q.d. 3. Lisinopril 10 mg p.o. q.d. 4. Aciphex 20 mg p.o. 30 minutes before breakfast.  He has samples to hold him    until his next office visit. 5. Levsin sublingual q.a.c. and q.h.s. p.r.n. abdominal cramps.  Prescription    written for #120 with two refills.  DIET:  Resume low fat diet.  FOLLOWUP:  He will follow up with Dr. [name] as scheduled at the beginning of April.  SPECIAL INSTRUCTIONS:  He was advised to notify Dr. Clelia Croft or Dr. [name] if he has recurrent nausea, vomiting, diarrhea or abdominal pain. Dictated by:   Tana Coast, P.A. Attending Physician:  Jonathon Bellows DD:  08/23/01 TD:  08/24/01 Job: [id number]

## 2011-01-16 ENCOUNTER — Other Ambulatory Visit: Payer: Self-pay | Admitting: Physician Assistant

## 2011-01-29 ENCOUNTER — Encounter: Payer: Self-pay | Admitting: Cardiology

## 2011-01-30 ENCOUNTER — Telehealth: Payer: Self-pay | Admitting: *Deleted

## 2011-01-30 ENCOUNTER — Ambulatory Visit: Payer: Medicare Other | Admitting: Cardiology

## 2011-01-30 NOTE — Telephone Encounter (Signed)
Pt states he had an appt today but he didn't have any transportation.  He states he's been taking Imdur 30 mg daily. He states he has noticed his eyes are watering and he gets cold. He states this happens when he takes his Imdur and other medications at the same time. If he waits and takes the Imdur around noon, he doesn't have this problem.   Pt states he takes allergy shots. He states his allergist and pharmacist told him that they did not feel his medications were causing this. Pt feels it is caused by some combination of taking the Imdur w/his other medications.  Pt will try taking Imdur at 12 and other meds at 7 am to determine if he continues to have problems.   Today's appt was r/s to first available appt w/Dr. Diona Browner for Oct 26th.

## 2011-01-30 NOTE — Telephone Encounter (Signed)
Noted  

## 2011-02-20 NOTE — Telephone Encounter (Signed)
Pt states he feels like he needs to stop Imdur. He thinks this is causing him to have pain on his (R) side of his chest and neck and swelling in his legs. He states he didn't take it today or yesterday and feels like a totally different person today. Pt notified to stop Imdur if he chooses to do so. He is adamant that it is causing his symptoms. He stated several times during conversation that his allergy doctor told him his allergy shots were not causing his symptoms so he feels it has to be his Imdur. Pt states he will take NTG as needed and go to the ER if he has "real" chest pain.

## 2011-02-24 NOTE — Telephone Encounter (Signed)
Pt left message on voicemail asking for return call. Pt states he has been taking Imdur at night. He states he doesn't have pain at night but does in the mornings. He states he took the trash out this morning and had pain. He wants to know if he can be seen sooner than 10/26. He does state he did not use NTG for this episode b/c it only lasts about 15-20 mins and he doesn't feel it is bad enough to go to ER. Pt instructed to use NTG for chest pain. His 10/26 OV was r/s to 9/26 d/t a cancellation opening.

## 2011-02-25 ENCOUNTER — Encounter: Payer: Self-pay | Admitting: Cardiology

## 2011-02-25 ENCOUNTER — Ambulatory Visit (INDEPENDENT_AMBULATORY_CARE_PROVIDER_SITE_OTHER): Payer: Medicare Other | Admitting: Cardiology

## 2011-02-25 ENCOUNTER — Encounter: Payer: Self-pay | Admitting: *Deleted

## 2011-02-25 VITALS — BP 136/83 | HR 85 | Resp 18 | Ht 71.0 in | Wt 243.0 lb

## 2011-02-25 DIAGNOSIS — I2510 Atherosclerotic heart disease of native coronary artery without angina pectoris: Secondary | ICD-10-CM

## 2011-02-25 DIAGNOSIS — I10 Essential (primary) hypertension: Secondary | ICD-10-CM

## 2011-02-25 DIAGNOSIS — E782 Mixed hyperlipidemia: Secondary | ICD-10-CM

## 2011-02-25 DIAGNOSIS — Z79899 Other long term (current) drug therapy: Secondary | ICD-10-CM

## 2011-02-25 MED ORDER — ISOSORBIDE MONONITRATE ER 30 MG PO TB24: 30.0000 mg | ORAL_TABLET | Freq: Two times a day (BID) | ORAL | Status: DC

## 2011-02-25 NOTE — Assessment & Plan Note (Signed)
Continue present regimen with modifications noted above.

## 2011-02-25 NOTE — Assessment & Plan Note (Signed)
Patient is reporting chest pain symptoms that have typical and atypical features, almost seem more gastrointestinal in etiology based on his description. He is to continue on proton pump inhibitor. Concurrently I have asked him to take his Lopressor twice daily and will increase Imdur 30 mg p.o. B.i.d. Office follow up will be arranged over the next month, and if his symptoms do not abate, we may need to consider a cardiac catheterization. If symptoms worsen acutely, I have recommended that he present to the ER.

## 2011-02-25 NOTE — Assessment & Plan Note (Signed)
Due for followup fasting lipid profile and liver function tests. 

## 2011-02-25 NOTE — Patient Instructions (Signed)
Your physician recommends that you schedule a follow-up appointment on October 26th 2012 @2 :40pm with Dr. Diona Browner.  Your physician has recommended you make the following change in your medication: INCREASE ISOSORBIDE 30MG  TO TWICE DAILY. START TAKING YOUR METOPROLOL TWICE DAILY ALSO. A new prescription was sent to [facility] for your isosorbide.  Your physician recommends that you have FASTING BLOOD WORK AT THE [facility]. Make sure you don't eat or drink for 8 hours.

## 2011-02-25 NOTE — Progress Notes (Signed)
Clinical Summary Michael Rose is a 60 y.o.male presenting for followup. He was seen back in February, called in to be seen prior to his October visit. He reports recent episodes of recurrent chest pain, has not taken any nitroglycerin however. Symptoms have typical and atypical features, almost sound more gastrointestinal in etiology. He is however on a proton pump inhibitor, also Imdur for possible angina.  His Cardiolite from earlier in the year is noted below, overall low risk. He does have underlying CAD status post previous intervention as reviewed.  He has been taking his Lopressor only once daily. We discussed this as well as other medication adjustments with close office followup planned. We may need to pursue a cardiac catheterization if his symptoms do not abate.   Allergies  Allergen Reactions  . Penicillins     Medication list reviewed.  Past Medical History  Diagnosis Date  . Hyperlipidemia   . Hypertension   . Osteoarthritis   . Myocardial infarction     IMI 2008  . CAD (coronary artery disease)     BMS to RCA 2008, [facility] cardiology, Dr. Katrinka Blazing  . Allergic reaction to contrast dye     Past Surgical History  Procedure Date  . Vasectomy   . Total knee arthroplasty     Left    Family History  Problem Relation Age of Onset  . Heart disease Mother   . Heart disease Father     Social History [name] reports that he has never smoked. He does not have any smokeless tobacco history on file. [name] has no alcohol history on file.  Review of Systems As outlined above. Reports other symptoms including watery eyes, occasional sweats, sometimes feeling of increased sputum. Concerned about possible seasonal allergies. Otherwise reviewed and negative.  Physical Examination Filed Vitals:   02/25/11 1423  BP: 136/83  Pulse: 85  Resp: 18  Morbidly obese male in no acute distress.  HEENT: Conjunctiva and lids normal, oropharynx with poor dentition.  Neck:  Supple, no elevated JVP or bruits.  Lungs: Clear to auscultation, diminished breath sounds, nonlabored.  Cardiac: Regular rate aythm, soft S4, no significant murmur.  Abdomen: Obese, nontender, bowel sounds present.  Skin: Warm and dry.  Extremities: Trace ankle edema, distal pulses one plus.  Musculoskeletal: No kyphosis.  Neuropsychiatric: Alert and oriented x3, affect appropriate.    ECG Normal sinus rhythm with nonspecific T-wave changes.  Studies Lexiscan Cardiolite 06/23/2010: No chest pain, no diagnostic ST segment changes, hypertensive response. Fixed inferobasal defect consistent with diaphragmatic attenuation, LVEF 51%, TID ratio 1.1 without ischemia.   Problem List and Plan

## 2011-02-25 NOTE — Assessment & Plan Note (Signed)
Weight loss is indicated. 

## 2011-03-02 LAB — URINALYSIS, ROUTINE W REFLEX MICROSCOPIC
Bilirubin Urine: NEGATIVE
Bilirubin Urine: NEGATIVE
Glucose, UA: 100 — AB
Hgb urine dipstick: NEGATIVE
Hgb urine dipstick: NEGATIVE
Nitrite: NEGATIVE
Protein, ur: NEGATIVE
Specific Gravity, Urine: 1.015
Urobilinogen, UA: 1

## 2011-03-02 LAB — BASIC METABOLIC PANEL
BUN: 10
BUN: 17
CO2: 26
CO2: 27
CO2: 28
CO2: 28
Calcium: 8 — ABNORMAL LOW
Calcium: 8.1 — ABNORMAL LOW
Calcium: 8.3 — ABNORMAL LOW
Calcium: 8.3 — ABNORMAL LOW
Calcium: 8.4
Chloride: 104
Creatinine, Ser: 0.91
Creatinine, Ser: 0.97
Creatinine, Ser: 1.08
GFR calc Af Amer: >60
GFR calc Af Amer: >60
GFR calc Af Amer: >60
GFR calc Af Amer: >60
GFR calc Af Amer: >60
GFR calc non Af Amer: >60
GFR calc non Af Amer: >60
GFR calc non Af Amer: >60
GFR calc non Af Amer: >60
Glucose, Bld: 109 — ABNORMAL HIGH
Glucose, Bld: 110 — ABNORMAL HIGH
Glucose, Bld: 120 — ABNORMAL HIGH
Glucose, Bld: 122 — ABNORMAL HIGH
Glucose, Bld: 94
Potassium: 3.8
Potassium: 3.9
Sodium: 136
Sodium: 137
Sodium: 138
Sodium: 139

## 2011-03-02 LAB — DIFFERENTIAL
Basophils Absolute: 0
Basophils Relative: 1
Eosinophils Absolute: 0.1
Eosinophils Relative: 1
Lymphocytes Relative: 28
Lymphs Abs: 2
Monocytes Absolute: 0.7
Neutro Abs: 4.1
Neutrophils Relative %: 60

## 2011-03-02 LAB — CBC
HCT: 31.2 — ABNORMAL LOW
HCT: 31.6 — ABNORMAL LOW
Hemoglobin: 10.5 — ABNORMAL LOW
Hemoglobin: 10.6 — ABNORMAL LOW
Hemoglobin: 10.7 — ABNORMAL LOW
Hemoglobin: 10.8 — ABNORMAL LOW
Hemoglobin: 11.6 — ABNORMAL LOW
Hemoglobin: 15.2
MCHC: 33.3
MCHC: 33.6
MCHC: 33.6
MCHC: 34.3
MCV: 82.1
MCV: 82.2
Platelets: 171
Platelets: 213
RBC: 3.85 — ABNORMAL LOW
RBC: 3.85 — ABNORMAL LOW
RBC: 4.2 — ABNORMAL LOW
RBC: 4.3
RBC: 5.56
RDW: 14.4
RDW: 14.4
RDW: 14.5
RDW: 14.6
RDW: 14.7
RDW: 14.7
WBC: 7
WBC: 9.6

## 2011-03-02 LAB — PROTIME-INR
INR: 1
Prothrombin Time: 13.1

## 2011-03-02 LAB — URINE CULTURE

## 2011-03-02 LAB — COMPREHENSIVE METABOLIC PANEL
AST: 27
Albumin: 3.8
Alkaline Phosphatase: 60
Calcium: 9
GFR calc Af Amer: >60
Glucose, Bld: 110 — ABNORMAL HIGH
Potassium: 4
Total Bilirubin: 0.7

## 2011-03-02 LAB — URINE MICROSCOPIC-ADD ON

## 2011-03-02 LAB — APTT: aPTT: 27

## 2011-03-09 ENCOUNTER — Telehealth: Payer: Self-pay | Admitting: *Deleted

## 2011-03-09 ENCOUNTER — Other Ambulatory Visit: Payer: Self-pay | Admitting: *Deleted

## 2011-03-09 MED ORDER — ATORVASTATIN CALCIUM 40 MG PO TABS: 40.0000 mg | ORAL_TABLET | Freq: Every day | ORAL | Status: DC

## 2011-03-09 NOTE — Telephone Encounter (Signed)
RX sent in due to  Change from blood work. Pt notified.

## 2011-03-09 NOTE — Telephone Encounter (Signed)
Pt aware of lipid results RX sent into pharmacy.

## 2011-03-10 LAB — CBC
HCT: 44.3
Hemoglobin: 14.6
MCHC: 33
MCV: 82.7
Platelets: 205
RBC: 5.36
RDW: 13.5
WBC: 12.1 — ABNORMAL HIGH

## 2011-03-10 LAB — CK TOTAL AND CKMB (NOT AT ARMC)
CK, MB: 56.9 — ABNORMAL HIGH
Relative Index: 11.5 — ABNORMAL HIGH
Total CK: 494 — ABNORMAL HIGH

## 2011-03-10 LAB — BASIC METABOLIC PANEL
BUN: 19
CO2: 27
Calcium: 9
Chloride: 105
Creatinine, Ser: 1.02
GFR calc Af Amer: >60
GFR calc non Af Amer: >60
Glucose, Bld: 98
Potassium: 4
Sodium: 138

## 2011-03-11 LAB — CARDIAC PANEL(CRET KIN+CKTOT+MB+TROPI)
CK, MB: 143.2 — ABNORMAL HIGH
Relative Index: 17.2 — ABNORMAL HIGH
Total CK: 831 — ABNORMAL HIGH
Troponin I: 11.7

## 2011-03-11 LAB — CBC
HCT: 43.9
Hemoglobin: 14.6
MCHC: 33.3
MCV: 81.3
Platelets: 216
RBC: 5.4
RDW: 13.8
WBC: 7.7

## 2011-03-11 LAB — LIPID PANEL
Cholesterol: 249 — ABNORMAL HIGH
HDL: 54
LDL Cholesterol: 171 — ABNORMAL HIGH
Total CHOL/HDL Ratio: 4.6
Triglycerides: 120
VLDL: 24

## 2011-03-11 LAB — BASIC METABOLIC PANEL
BUN: 14
CO2: 20
Calcium: 8.8
Chloride: 107
Creatinine, Ser: 0.98
GFR calc Af Amer: >60
GFR calc non Af Amer: >60
Glucose, Bld: 146 — ABNORMAL HIGH
Potassium: 4.4
Sodium: 136

## 2011-03-11 LAB — CK TOTAL AND CKMB (NOT AT ARMC)
CK, MB: 101.8 — ABNORMAL HIGH
CK, MB: 91.4 — ABNORMAL HIGH
Relative Index: 13.6 — ABNORMAL HIGH
Relative Index: 16.3 — ABNORMAL HIGH
Total CK: 626 — ABNORMAL HIGH
Total CK: 671 — ABNORMAL HIGH

## 2011-03-20 ENCOUNTER — Encounter: Payer: Self-pay | Admitting: Cardiology

## 2011-03-27 ENCOUNTER — Ambulatory Visit: Payer: Medicare Other | Admitting: Cardiology

## 2011-03-27 ENCOUNTER — Encounter: Payer: Self-pay | Admitting: Cardiology

## 2011-03-27 ENCOUNTER — Ambulatory Visit (INDEPENDENT_AMBULATORY_CARE_PROVIDER_SITE_OTHER): Payer: Medicare Other | Admitting: Cardiology

## 2011-03-27 VITALS — BP 115/74 | HR 80 | Ht 71.0 in | Wt 239.0 lb

## 2011-03-27 DIAGNOSIS — I2510 Atherosclerotic heart disease of native coronary artery without angina pectoris: Secondary | ICD-10-CM

## 2011-03-27 DIAGNOSIS — E782 Mixed hyperlipidemia: Secondary | ICD-10-CM

## 2011-03-27 DIAGNOSIS — I10 Essential (primary) hypertension: Secondary | ICD-10-CM

## 2011-03-27 NOTE — Assessment & Plan Note (Signed)
Symptomatically stable, improved in fact, on medical therapy. Continue observation for now. Followup arranged.

## 2011-03-27 NOTE — Patient Instructions (Signed)
Your physician wants you to follow-up in: 3 months. You will receive a reminder letter in the mail one-two months in advance. If you don't receive a letter, please call our office to schedule the follow-up appointment. Your physician recommends that you continue on your current medications as directed. Please refer to the Current Medication list given to you today. Your physician recommends that you go to the [facility] for a FASTING lipid profile and liver function labs. Do not eat or drink after midnight. DO LABS IN 3 MONTHS BEFORE OFFICE VISIT.

## 2011-03-27 NOTE — Progress Notes (Signed)
Clinical Summary Michael Rose is a 60 y.o.male presenting for followup. He was seen in late September. Medication adjustments were made at that visit related to chest pain symptoms.  He reports significant improvement since that time. Overall he is pleased with his progress. Reports fewer problems with allergies, cough and congestion as well.  We discussed continuing medical therapy and observation at this time, reserving cardiac catheterization for progressive symptoms on optimal medical therapy.   Allergies  Allergen Reactions  . Iodinated Diagnostic Agents   . Penicillins     Medication list reviewed.  Past Medical History  Diagnosis Date  . Hyperlipidemia   . Hypertension   . Osteoarthritis   . Myocardial infarction     IMI 2008  . CAD (coronary artery disease)     BMS to RCA 2008, [facility] cardiology, Dr. Katrinka Blazing  . Allergic reaction to contrast dye     Past Surgical History  Procedure Date  . Vasectomy   . Total knee arthroplasty     Left    Family History  Problem Relation Age of Onset  . Heart disease Mother   . Heart disease Father     Social History Michael Rose reports that he has never smoked. He has never used smokeless tobacco. Michael Rose has no alcohol history on file.  Review of Systems Otherwise reviewed and negative.  Physical Examination Filed Vitals:   03/27/11 1430  BP: 115/74  Pulse: 80    Morbidly obese male in no acute distress.  HEENT: Conjunctiva and lids normal, oropharynx with poor dentition.  Neck: Supple, no elevated JVP or bruits.  Lungs: Clear to auscultation, diminished breath sounds, nonlabored.  Cardiac: Regular rate aythm, soft S4, no significant murmur.  Abdomen: Obese, nontender, bowel sounds present.  Skin: Warm and dry.  Extremities: Trace ankle edema, distal pulses one plus.  Musculoskeletal: No kyphosis.  Neuropsychiatric: Alert and oriented x3, affect appropriate.    Problem List and Plan

## 2011-03-27 NOTE — Assessment & Plan Note (Signed)
Blood pressure well controlled

## 2011-03-27 NOTE — Assessment & Plan Note (Signed)
Followup fasting lipid profile and liver function tests for next visit. 

## 2011-04-27 ENCOUNTER — Telehealth: Payer: Self-pay | Admitting: *Deleted

## 2011-04-27 NOTE — Telephone Encounter (Signed)
Isosorbide mononitrate was increased and patient has started having a lot of mucous and congestion in the mornings, patient wanted to know if this could be causing this problem. Nurse advised patient that increased mucous wasn't a usual side effect of imdur and MD would be informed.

## 2011-04-28 ENCOUNTER — Telehealth: Payer: Self-pay | Admitting: *Deleted

## 2011-04-28 NOTE — Telephone Encounter (Signed)
Patient denies having dizziness,sob or lightheadedness when BP is running lower. Patient stated that he takes his BP about 2 hours after taking his medications,in which he takes all at the same time in the mornings. Patient said it doesn't always run 90'/60's, that it happened 1-2 times and he didn't have any symptoms with it. Patient stated that yesterday at 4pm his BP was 107/66 left arm and 101/67 in right arm. Patient said this morning his BP was 124/85 before taking medications. Nurse advised patient that if he feels symptoms like dizziness,lightheadedness during those times, then he needed to contact our office.

## 2011-04-28 NOTE — Telephone Encounter (Signed)
Patient informed and nurse told him to see his allergist or PCP about his spitting up mucous problem. Patient verbalized understanding of plan.

## 2011-04-28 NOTE — Telephone Encounter (Signed)
Patient called to say that his BP sometimes run 90's/60's and wanted to know when to be concerned that it's too low. Nurse informed him that below these numbers are getting too low. Patient wanted to know if the metoprolol would cause this problem. Please advise.

## 2011-04-28 NOTE — Telephone Encounter (Signed)
This would not be a typical side effect of Imdur.

## 2011-04-28 NOTE — Telephone Encounter (Signed)
If he is asymptomatic with systolic blood pressure in the 90s, then generally this is not of major concern. The main question is how often this happens. Metoprolol can certainly lower blood pressure, and his could be reduced from 50 mg twice daily to 25 mg twice daily if needed. He is also on Imdur, which can cause some reduction in blood pressure as well. If he only notices systolic blood pressure in the 90s on occasion, then I would probably not make any major changes now, unless he is symptomatic.

## 2011-05-01 ENCOUNTER — Telehealth: Payer: Self-pay | Admitting: *Deleted

## 2011-05-01 NOTE — Telephone Encounter (Signed)
MD informed and say's its okay for patient to take mucinex with his medications. Patient informed.

## 2011-06-02 HISTORY — PX: CHOLECYSTECTOMY: SHX55

## 2011-06-05 ENCOUNTER — Telehealth: Payer: Self-pay | Admitting: *Deleted

## 2011-06-05 NOTE — Telephone Encounter (Signed)
Pt walked into the office stating that his Lipitor is causing stomach pains. He is seeing Dr. Sherryll Burger at 4 pm today.   Spoke w/pt's wife. He is having stomach pains since starting Lipitor. She wants to know if he can try something else or go back to what he was taking. He was previously on simvastatin that was changed to the Lipitor in Oct.

## 2011-06-05 NOTE — Telephone Encounter (Signed)
Pt states Dr. Sherryll Burger is admitting the pt to the hospital to determine why he is having the stomach pain. He is aware he can make change back to Zocor at d/c if he feels this is necessary depending on what they find out during hospitalization. Pt verbalized understanding.

## 2011-06-05 NOTE — Telephone Encounter (Signed)
Yes. Can discontinue Lipitor and go back to prior dose of Zocor.

## 2011-06-07 DIAGNOSIS — I252 Old myocardial infarction: Secondary | ICD-10-CM

## 2011-06-07 DIAGNOSIS — I2510 Atherosclerotic heart disease of native coronary artery without angina pectoris: Secondary | ICD-10-CM

## 2011-06-07 DIAGNOSIS — R079 Chest pain, unspecified: Secondary | ICD-10-CM

## 2011-06-22 ENCOUNTER — Encounter: Payer: Self-pay | Admitting: Cardiology

## 2011-06-22 ENCOUNTER — Ambulatory Visit (INDEPENDENT_AMBULATORY_CARE_PROVIDER_SITE_OTHER): Payer: Medicare Other | Admitting: Cardiology

## 2011-06-22 VITALS — BP 142/81 | HR 82 | Resp 16 | Ht 69.0 in | Wt 222.0 lb

## 2011-06-22 DIAGNOSIS — Z01818 Encounter for other preprocedural examination: Secondary | ICD-10-CM

## 2011-06-22 DIAGNOSIS — E782 Mixed hyperlipidemia: Secondary | ICD-10-CM

## 2011-06-22 DIAGNOSIS — I10 Essential (primary) hypertension: Secondary | ICD-10-CM

## 2011-06-22 DIAGNOSIS — I2510 Atherosclerotic heart disease of native coronary artery without angina pectoris: Secondary | ICD-10-CM

## 2011-06-22 NOTE — Progress Notes (Signed)
   Clinical Summary Michael Rose is a 61 y.o.male presenting for followup. He was seen in October 2012. He is here with his wife today, reports recent problems with abdominal and thoracic discomfort, mainly postprandial. He states he has been diagnosed with gallbladder disease following recent HIDA scan at [facility], and is scheduled to undergo a laparoscopic cholecystectomy with Dr. Cristy Folks on Wednesday.  He denies any predictable, exertional chest pain. Reports compliance with his medications which are outlined below. He underwent ischemic evaluation last January, noted below. No obvious CHF symptoms. No palpitations or syncope.   Allergies  Allergen Reactions  . Iodinated Diagnostic Agents   . Penicillins     Current Outpatient Prescriptions  Medication Sig Dispense Refill  . aspirin 81 MG tablet Take 81 mg by mouth daily.       Marland Kitchen atorvastatin (LIPITOR) 40 MG tablet Take 1 tablet (40 mg total) by mouth daily.  30 tablet  6  . dexlansoprazole (DEXILANT) 60 MG capsule Take 60 mg by mouth daily.        Marland Kitchen HYDROcodone-acetaminophen (NORCO) 5-325 MG per tablet Take 1 tablet by mouth 2 (two) times daily as needed.        . isosorbide mononitrate (IMDUR) 30 MG 24 hr tablet Take 30 mg by mouth daily. DOSE INCREASE      . metoprolol succinate (TOPROL-XL) 50 MG 24 hr tablet Take 50 mg by mouth daily. Take with or immediately following a meal.      . zolpidem (AMBIEN) 10 MG tablet Take 10 mg by mouth at bedtime as needed.          Past Medical History  Diagnosis Date  . Hyperlipidemia   . Hypertension   . Osteoarthritis   . Myocardial infarction     IMI 2008  . CAD (coronary artery disease)     BMS to RCA 2008, [facility] cardiology, Dr. Katrinka Blazing  . Allergic reaction to contrast dye     Past Surgical History  Procedure Date  . Vasectomy   . Total knee arthroplasty     Left    Family History  Problem Relation Age of Onset  . Heart disease Mother   . Heart disease Father     Social  History [name] reports that he has never smoked. He has never used smokeless tobacco. [name] has no alcohol history on file.  Review of Systems Loose stools recently. No fevers or chills. No orthopnea or PND. No leg swelling. Otherwise reviewed and negative.  Physical Examination Filed Vitals:   06/22/11 1512  BP: 142/81  Pulse: 82  Resp: 16   Morbidly obese male in no acute distress.  HEENT: Conjunctiva and lids normal, oropharynx with poor dentition.  Neck: Supple, no elevated JVP or bruits.  Lungs: Clear to auscultation, diminished breath sounds, nonlabored.  Cardiac: Regular rate aythm, soft S4, no significant murmur.  Abdomen: Obese, nontender, bowel sounds present.  Skin: Warm and dry.  Extremities: Trace ankle edema, distal pulses one plus.  Musculoskeletal: No kyphosis.  Neuropsychiatric: Alert and oriented x3, affect appropriate.  ECG Recent tracing from January reviewed showing sinus rhythm with nonspecific T-wave changes, small, nondiagnostic inferior Q waves.  Studies Lexiscan Cardiolite 06/23/2010:  No chest pain, no diagnostic ST segment changes, hypertensive response. Fixed inferobasal defect consistent with diaphragmatic attenuation, LVEF 51%, TID ratio 1.1 without ischemia.   Problem List and Plan

## 2011-06-22 NOTE — Patient Instructions (Signed)
Your physician recommends that you return for lab work in: in 2 months (we will mail lab orders to you).  Your physician recommends that you continue on your current medications as directed. Please refer to the Current Medication list given to you today.  Your physician recommends that you schedule a follow-up appointment in: 2 months.

## 2011-06-22 NOTE — Assessment & Plan Note (Signed)
Patient has been clinically stable from a cardiac perspective, with low risk Cardiolite from last January, on medical therapy. Recent ECG reviewed. At this point would anticipate that he can proceed with an elective laparoscopic cholecystectomy at an acceptable perioperative cardiac risk. He has in fact already held his aspirin in anticipation of surgery on Wednesday with Dr. Cristy Folks. We can certainly see him in consultation if the need arises, otherwise will schedule a followup visit in the office.

## 2011-06-22 NOTE — Assessment & Plan Note (Signed)
History outlined above. Followup arranged.

## 2011-06-22 NOTE — Assessment & Plan Note (Signed)
Blood pressure mildly elevated today. No changes to medical regimen as yet.

## 2011-06-22 NOTE — Assessment & Plan Note (Signed)
Will obtain fasting lipid profile and liver function tests prior to his next visit. He continues on Lipitor.

## 2011-06-23 ENCOUNTER — Telehealth: Payer: Self-pay | Admitting: Cardiology

## 2011-06-23 ENCOUNTER — Encounter: Payer: Self-pay | Admitting: Cardiology

## 2011-06-23 NOTE — Telephone Encounter (Signed)
Patient at [facility] for Gallbladder surgery, Michael Rose needs surgical clearance sent.  Please fax to [phone].

## 2011-07-01 ENCOUNTER — Other Ambulatory Visit: Payer: Self-pay | Admitting: *Deleted

## 2011-07-01 MED ORDER — METOPROLOL SUCCINATE ER 50 MG PO TB24: 50.0000 mg | ORAL_TABLET | Freq: Every day | ORAL | Status: DC

## 2011-07-16 ENCOUNTER — Telehealth: Payer: Self-pay | Admitting: *Deleted

## 2011-07-16 NOTE — Telephone Encounter (Signed)
Pt states he had his gallbladder taken off on the 23rd. On 2/6, Dr. Sherryll Burger stopped his Imdur. He states on 2/12 Dr. Sherryll Burger stopped ASA d/t ringing in his ears. He doesn't know why his Imdur was stopped.  He is having a little chest pain again. He wonders if he should resume the Imdur. Pt notified to d/w Dr. Sherryll Burger as he is the provider who d/c'd Imdur and we do not know why he made this decision. Pt verbalized understanding.

## 2011-07-16 NOTE — Telephone Encounter (Signed)
Patient walked into office c/o of active chest pain 2/3.  States he called Dr. Sherril Croon office & secretary told him to go to ED per Dr. Sherril Croon.  Again, advised him to go to ED for evaluation.

## 2011-07-16 NOTE — Telephone Encounter (Signed)
Pt left message on voicemail asking for return call.  

## 2011-07-23 ENCOUNTER — Other Ambulatory Visit: Payer: Self-pay | Admitting: *Deleted

## 2011-07-23 ENCOUNTER — Telehealth: Payer: Self-pay | Admitting: *Deleted

## 2011-07-23 DIAGNOSIS — E782 Mixed hyperlipidemia: Secondary | ICD-10-CM

## 2011-07-23 NOTE — Telephone Encounter (Signed)
Message left on nurse's voicemail to call r/e lipitor. Call returned and nurse left message on patient's voicemail to call our office back.

## 2011-07-29 ENCOUNTER — Other Ambulatory Visit: Payer: Self-pay | Admitting: *Deleted

## 2011-07-29 ENCOUNTER — Telehealth: Payer: Self-pay | Admitting: *Deleted

## 2011-07-29 DIAGNOSIS — Z79899 Other long term (current) drug therapy: Secondary | ICD-10-CM

## 2011-07-29 DIAGNOSIS — E785 Hyperlipidemia, unspecified: Secondary | ICD-10-CM

## 2011-07-29 NOTE — Telephone Encounter (Signed)
Patient can come by office to pick up orders.

## 2011-08-21 ENCOUNTER — Encounter: Payer: Self-pay | Admitting: Cardiology

## 2011-08-21 ENCOUNTER — Ambulatory Visit (INDEPENDENT_AMBULATORY_CARE_PROVIDER_SITE_OTHER): Payer: Medicare Other | Admitting: Cardiology

## 2011-08-21 VITALS — BP 130/75 | HR 82 | Ht 69.0 in | Wt 212.0 lb

## 2011-08-21 DIAGNOSIS — I2510 Atherosclerotic heart disease of native coronary artery without angina pectoris: Secondary | ICD-10-CM

## 2011-08-21 DIAGNOSIS — I10 Essential (primary) hypertension: Secondary | ICD-10-CM

## 2011-08-21 DIAGNOSIS — E782 Mixed hyperlipidemia: Secondary | ICD-10-CM

## 2011-08-21 MED ORDER — ISOSORBIDE MONONITRATE ER 30 MG PO TB24: 30.0000 mg | ORAL_TABLET | Freq: Every evening | ORAL | Status: DC

## 2011-08-21 MED ORDER — ASPIRIN EC 81 MG PO TBEC: 81.0000 mg | DELAYED_RELEASE_TABLET | Freq: Every day | ORAL | Status: AC

## 2011-08-21 NOTE — Assessment & Plan Note (Signed)
Patient is doing a nice job with diet and weight loss. Since last year he has come down from 254 pounds to 212 pounds.

## 2011-08-21 NOTE — Progress Notes (Signed)
   Clinical Summary Mr. Clasby is a 61 y.o.male presenting for followup. He was seen in January. He underwent laparoscopic cholecystectomy since last visit, did well perioperatively from a cardiac perspective.  He has been watching his diet, continues to lose weight, looks better in general. States that he has felt well with the exception of some recent abdominal pain after eating out, some loose stools. Also showed some recent elevation in his blood pressure on checks at home. He reports compliance with his medications.  Recent labwork showed AST 19, ALT 22, cholesterol 122, triglycerides 129, HDL 45, LDL 51. We discussed this today.  He continues to undergo evaluation for tinnitus, now on gabapentin, followed by ENT. He was taken off aspirin temporarily, although this did not improve his tinnitus. He was also also taken off Imdur due to low blood pressures, although trend has increased.   Allergies  Allergen Reactions  . Iodinated Diagnostic Agents   . Penicillins     Current Outpatient Prescriptions  Medication Sig Dispense Refill  . ALPRAZolam (XANAX) 0.5 MG tablet Take 0.25 mg by mouth Twice daily.       Marland Kitchen atorvastatin (LIPITOR) 40 MG tablet Take 1 tablet (40 mg total) by mouth daily.  30 tablet  6  . dexlansoprazole (DEXILANT) 60 MG capsule Take 60 mg by mouth daily.        Marland Kitchen gabapentin (NEURONTIN) 300 MG capsule Take 1 capsule by mouth Three times a day.      . metoprolol succinate (TOPROL-XL) 50 MG 24 hr tablet Take 1 tablet (50 mg total) by mouth daily. Take with or immediately following a meal.  30 tablet  6  . zolpidem (AMBIEN) 10 MG tablet Take 10 mg by mouth at bedtime.       Marland Kitchen aspirin EC 81 MG tablet Take 1 tablet (81 mg total) by mouth daily.      . isosorbide mononitrate (IMDUR) 30 MG 24 hr tablet Take 1 tablet (30 mg total) by mouth every evening.  30 tablet  6    Past Medical History  Diagnosis Date  . Hyperlipidemia   . Hypertension   . Osteoarthritis   .  Myocardial infarction     IMI 2008  . CAD (coronary artery disease)     BMS to RCA 2008, [facility] cardiology, Dr. Katrinka Blazing  . Allergic reaction to contrast dye     Social History Mr. Biss reports that he has never smoked. He has never used smokeless tobacco. Mr. Gripp has no alcohol history on file.  Review of Systems No palpitations. Some anxiety, uses low-dose Xanax. No orthopnea or PND. No reported bleeding problems. Otherwise negative.  Physical Examination Filed Vitals:   08/21/11 0822  BP: 130/75  Pulse: 82   Overweight male in no acute distress.  HEENT: Conjunctiva and lids normal, oropharynx with poor dentition.  Neck: Supple, no elevated JVP or bruits.  Lungs: Clear to auscultation, diminished breath sounds, nonlabored.  Cardiac: Regular rate aythm, soft S4, no significant murmur.  Abdomen: Well-healed keyhole and umbilical incisions. nontender, bowel sounds present.  Skin: Warm and dry.  Extremities: No edema, distal pulses one plus.  Musculoskeletal: No kyphosis.  Neuropsychiatric: Alert and oriented x3, affect appropriate.    Problem List and Plan

## 2011-08-21 NOTE — Assessment & Plan Note (Signed)
No other changes to present medical regimen except as outlined.

## 2011-08-21 NOTE — Assessment & Plan Note (Signed)
Recent lipids reviewed and looked good overall.

## 2011-08-21 NOTE — Patient Instructions (Signed)
Your physician wants you to follow-up in: 6 months. You will receive a reminder letter in the mail one-two months in advance. If you don't receive a letter, please call our office to schedule the follow-up appointment. Take Aspirin 81 mg every morning and Imdur (isosorbide) 30 mg every evening.

## 2011-08-21 NOTE — Assessment & Plan Note (Signed)
Has been relatively stable symptomatically on medical therapy. Would recommend resuming aspirin 81 mg daily in the morning, and also try go back on Imdur 30 mg in the evening as this was controlling prior angina fairly well. Continue to watch blood pressures. Followup arranged.

## 2012-02-23 ENCOUNTER — Ambulatory Visit: Payer: Medicare Other | Admitting: Cardiology

## 2012-03-31 ENCOUNTER — Ambulatory Visit (INDEPENDENT_AMBULATORY_CARE_PROVIDER_SITE_OTHER): Payer: Medicare Other | Admitting: Cardiology

## 2012-03-31 ENCOUNTER — Encounter: Payer: Self-pay | Admitting: Cardiology

## 2012-03-31 VITALS — BP 119/76 | HR 89 | Ht 70.0 in | Wt 234.4 lb

## 2012-03-31 DIAGNOSIS — I10 Essential (primary) hypertension: Secondary | ICD-10-CM

## 2012-03-31 DIAGNOSIS — E782 Mixed hyperlipidemia: Secondary | ICD-10-CM

## 2012-03-31 DIAGNOSIS — I2510 Atherosclerotic heart disease of native coronary artery without angina pectoris: Secondary | ICD-10-CM

## 2012-03-31 NOTE — Assessment & Plan Note (Signed)
Lipids have been well controlled over time, on statin therapy. Keep followup with Dr. Sherryll Burger.

## 2012-03-31 NOTE — Assessment & Plan Note (Signed)
Blood pressure is normal today. 

## 2012-03-31 NOTE — Assessment & Plan Note (Signed)
Continues to remain symptomatically stable on current medical regimen. No cardiac hospitalizations. Continue observation for now. I encouraged him to work on weight loss which he had been somewhat successful with at the last visit, also consider a basic walking regimen.

## 2012-03-31 NOTE — Progress Notes (Signed)
   Clinical Summary Michael Rose is a 61 y.o.male presenting for followup. He was seen back in March. He reports no angina symptoms or progressive shortness of breath. Has not been exercising as regularly, and his weight has increased.  He has had interval behavioral health evaluation, and is on the medications outlined below. He follows regularly at [facility].  He reports problems with erectile dysfunction. We reviewed his medications including long-acting nitrates. He did not want to change his current cardiac regimen since he has been doing so well, and I would have to take him off nitroglycerin if he were to be able to try something like Viagra.   Allergies  Allergen Reactions  . Iodinated Diagnostic Agents   . Penicillins     Current Outpatient Prescriptions  Medication Sig Dispense Refill  . ALPRAZolam (XANAX) 0.5 MG tablet Take 0.25 mg by mouth Twice daily.       Marland Kitchen aspirin EC 81 MG tablet Take 1 tablet (81 mg total) by mouth daily.      Marland Kitchen atorvastatin (LIPITOR) 40 MG tablet Take 1 tablet (40 mg total) by mouth daily.  30 tablet  6  . dexlansoprazole (DEXILANT) 60 MG capsule Take 60 mg by mouth daily.        . fluvoxaMINE (LUVOX) 100 MG tablet Take 100 mg by mouth daily.      . isosorbide mononitrate (IMDUR) 30 MG 24 hr tablet Take 1 tablet (30 mg total) by mouth every evening.  30 tablet  6  . metoprolol succinate (TOPROL-XL) 50 MG 24 hr tablet Take 1 tablet (50 mg total) by mouth daily. Take with or immediately following a meal.  30 tablet  6  . mirtazapine (REMERON) 30 MG tablet Take 30 mg by mouth at bedtime.        Past Medical History  Diagnosis Date  . Hyperlipidemia   . Hypertension   . Osteoarthritis   . Myocardial infarction     IMI 2008  . CAD (coronary artery disease)     BMS to RCA 2008, [facility] cardiology, Dr. Katrinka Blazing  . Allergic reaction to contrast dye     Social History Michael Rose reports that he has never smoked. He has never used smokeless tobacco. Mr.  Rose has no alcohol history on file.  Review of Systems No palpitations or syncope. Reports knee pain, arthritis. Insomnia. Appetite is stable. Otherwise negative.  Physical Examination Filed Vitals:   03/31/12 0841  BP: 119/76  Pulse: 89   Filed Weights   03/31/12 0841  Weight: 234 lb 6.4 oz (106.323 kg)   Overweight male in no acute distress.  HEENT: Conjunctiva and lids normal, oropharynx with poor dentition.  Neck: Supple, no elevated JVP or bruits.  Lungs: Clear to auscultation, diminished breath sounds, nonlabored.  Cardiac: Regular rate aythm, soft S4, no significant murmur.  Abdomen: Nontender, bowel sounds present.  Skin: Warm and dry.  Extremities: No edema, distal pulses one plus.    Problem List and Plan   CORONARY ATHEROSCLEROSIS NATIVE CORONARY ARTERY Continues to remain symptomatically stable on current medical regimen. No cardiac hospitalizations. Continue observation for now. I encouraged him to work on weight loss which he had been somewhat successful with at the last visit, also consider a basic walking regimen.  ESSENTIAL HYPERTENSION, BENIGN Blood pressure is normal today.  MIXED HYPERLIPIDEMIA Lipids have been well controlled over time, on statin therapy. Keep followup with Dr. Sherryll Burger.    Jonelle Sidle, M.D., F.A.C.C.

## 2012-03-31 NOTE — Patient Instructions (Addendum)

## 2012-09-01 ENCOUNTER — Encounter: Payer: Self-pay | Admitting: *Deleted

## 2012-09-01 ENCOUNTER — Encounter: Payer: Self-pay | Admitting: Physician Assistant

## 2012-09-01 ENCOUNTER — Ambulatory Visit (INDEPENDENT_AMBULATORY_CARE_PROVIDER_SITE_OTHER): Payer: Medicare Other | Admitting: Physician Assistant

## 2012-09-01 VITALS — BP 125/76 | HR 82 | Ht 71.0 in | Wt 255.4 lb

## 2012-09-01 DIAGNOSIS — I2510 Atherosclerotic heart disease of native coronary artery without angina pectoris: Secondary | ICD-10-CM

## 2012-09-01 DIAGNOSIS — E782 Mixed hyperlipidemia: Secondary | ICD-10-CM

## 2012-09-01 DIAGNOSIS — I10 Essential (primary) hypertension: Secondary | ICD-10-CM

## 2012-09-01 NOTE — Assessment & Plan Note (Signed)
Well-controlled on current medication regimen 

## 2012-09-01 NOTE — Progress Notes (Signed)
Primary Cardiologist: Simona Huh, MD   HPI: Patient presents for preoperative clearance, last seen here in October 2013, by Dr. Diona Browner.  He returns reporting no interim development of exertional chest pain. His mobility is, however, somewhat compromised by the pain in his right knee, for which he is awaiting clearance to undergo TKR. He is status post left TKR in 2010.   Recent 12-lead EKG, March 10, indicated NSR 89 bpm; question old IMI; nonspecific ST changes.  Allergies  Allergen Reactions  . Iodinated Diagnostic Agents   . Penicillins     Current Outpatient Prescriptions  Medication Sig Dispense Refill  . ALPRAZolam (XANAX) 0.5 MG tablet Take 0.25 mg by mouth Twice daily.       Marland Kitchen aspirin 81 MG tablet Take 81 mg by mouth daily.      Marland Kitchen atorvastatin (LIPITOR) 40 MG tablet Take 40 mg by mouth every evening.      Marland Kitchen dexlansoprazole (DEXILANT) 60 MG capsule Take 60 mg by mouth daily.        . fluvoxaMINE (LUVOX) 100 MG tablet Take 100 mg by mouth daily.      . isosorbide mononitrate (IMDUR) 30 MG 24 hr tablet Take 30 mg by mouth daily.      . metoprolol succinate (TOPROL-XL) 50 MG 24 hr tablet Take 1 tablet (50 mg total) by mouth daily. Take with or immediately following a meal.  30 tablet  6  . mirtazapine (REMERON) 30 MG tablet Take 30 mg by mouth at bedtime.      . isosorbide mononitrate (IMDUR) 30 MG 24 hr tablet Take 1 tablet (30 mg total) by mouth every evening.  30 tablet  6   No current facility-administered medications for this visit.    Past Medical History  Diagnosis Date  . Hyperlipidemia   . Hypertension   . Osteoarthritis   . Myocardial infarction     IMI 2008  . CAD (coronary artery disease)     BMS to RCA 2008, [facility] cardiology, Dr. Katrinka Blazing  . Allergic reaction to contrast dye     Past Surgical History  Procedure Laterality Date  . Vasectomy    . Total knee arthroplasty      Left    History   Social History  . Marital Status: Married    Spouse  Name: N/A    Number of Children: N/A  . Years of Education: N/A   Occupational History  . disabled due to arthritis    Social History Main Topics  . Smoking status: Never Smoker   . Smokeless tobacco: Never Used  . Alcohol Use: Not on file  . Drug Use: Not on file  . Sexually Active: Not on file   Other Topics Concern  . Not on file   Social History Narrative   Married    Family History  Problem Relation Age of Onset  . Heart disease Mother   . Heart disease Father     ROS: no nausea, vomiting; no fever, chills; no melena, hematochezia; no claudication  PHYSICAL EXAM: BP 125/76  Pulse 82  Ht 5\' 11"  (1.803 m)  Wt 255 lb 6.4 oz (115.849 kg)  BMI 35.64 kg/m2  SpO2 97% GENERAL: 62 year old male; NAD HEENT: NCAT, PERRLA, EOMI; sclera clear; no xanthelasma NECK: palpable bilateral carotid pulses, no bruits; no JVD; no TM LUNGS: CTA bilaterally CARDIAC: RRR (S1, S2); no significant murmurs; no rubs or gallops ABDOMEN: soft, protuberant EXTREMETIES: trace peripheral edema SKIN: warm/dry; no obvious rash/lesions MUSCULOSKELETAL:  no joint deformity NEURO: no focal deficit; NL affect   EKG: reviewed and available in Electronic Records   ASSESSMENT & PLAN:  CORONARY ATHEROSCLEROSIS NATIVE CORONARY ARTERY Recommendation is to schedule a pharmacologic nuclear imaging study for risk stratification. If negative for ischemia, patient is deemed to be at an acceptable risk to proceed with surgery. ASA to be held prior to surgery, as advised, and then resumed once he is cleared to do so. We also recommend that he remain on beta blocker, Imdur, and statin, post procedure.  Essential hypertension, benign Well-controlled on current medication regimen  Mixed hyperlipidemia Followed by primary M.D. Continue current dose statin.    Gene Idabelle Mcpeters, PAC

## 2012-09-01 NOTE — Assessment & Plan Note (Signed)
Recommendation is to schedule a pharmacologic nuclear imaging study for risk stratification. If negative for ischemia, patient is deemed to be at an acceptable risk to proceed with surgery. ASA to be held prior to surgery, as advised, and then resumed once he is cleared to do so. We also recommend that he remain on beta blocker, Imdur, and statin, post procedure.

## 2012-09-01 NOTE — Patient Instructions (Signed)
   Lexiscan cardiolite stress test  Office will contact with results Continue all current medications. Your physician wants you to follow up in: 6 months.  You will receive a reminder letter in the mail one-two months in advance.  If you don't receive a letter, please call our office to schedule the follow up appointment

## 2012-09-01 NOTE — Assessment & Plan Note (Signed)
Followed by primary M.D. Continue current dose statin.

## 2012-09-06 DIAGNOSIS — I2510 Atherosclerotic heart disease of native coronary artery without angina pectoris: Secondary | ICD-10-CM

## 2012-09-09 ENCOUNTER — Telehealth: Payer: Self-pay | Admitting: *Deleted

## 2012-09-09 NOTE — Telephone Encounter (Signed)
Message copied by Lesle Chris on Fri Sep 09, 2012 11:30 AM ------      Message from: Prescott Parma C      Created: Thu Sep 08, 2012 10:44 AM       Abnormal stress test with mild peri infarct ischemia. Recommend pt return to clinic to review results and discuss possible cath, prior to undergoing knee surgery. ------

## 2012-09-09 NOTE — Telephone Encounter (Signed)
Notes Recorded by Lesle Chris, LPN on 01/28/5620 at 11:30 AM Patient notified. OV scheduled for 4/17 with Gene. (SM here this day) ------  Notes Recorded by Lesle Chris, LPN on 08/06/6576 at 11:01 AM Left message to return call.

## 2012-09-15 ENCOUNTER — Ambulatory Visit (INDEPENDENT_AMBULATORY_CARE_PROVIDER_SITE_OTHER): Payer: Medicare Other | Admitting: Physician Assistant

## 2012-09-15 ENCOUNTER — Encounter: Payer: Self-pay | Admitting: Physician Assistant

## 2012-09-15 VITALS — BP 134/76 | HR 89 | Ht 71.0 in | Wt 252.0 lb

## 2012-09-15 DIAGNOSIS — I2510 Atherosclerotic heart disease of native coronary artery without angina pectoris: Secondary | ICD-10-CM

## 2012-09-15 MED ORDER — NITROGLYCERIN 0.4 MG SL SUBL: 0.4000 mg | SUBLINGUAL_TABLET | SUBLINGUAL | Status: DC | PRN

## 2012-09-15 NOTE — Patient Instructions (Signed)
   Nitroglycerin as needed for severe chest pain only Continue all other current medications. Your physician wants you to follow up in: 6 months.  You will receive a reminder letter in the mail one-two months in advance.  If you don't receive a letter, please call our office to schedule the follow up appointment

## 2012-09-15 NOTE — Progress Notes (Signed)
Primary Cardiologist: Simona Huh, MD   HPI: Patient returns for early followup and review of an abnormal pharmacologic nuclear imaging study, ordered during recent visit for preoperative clearance. Patient is awaiting clearance to undergo right TKR. He presented with no interim development of exertional CP; his mobility is compromised, however, by severe right knee pain.  Stress test was reviewed by Dr. Diona Browner, with results as follows:   - Lexiscan Cardiolite, April 8: Small inferolateral defect consistent with scar/minor peri-infarct ischemia; EF 52%  Patient denies any interim development of exertional CP, since recent OV. We reviewed the results of his recent stress test. He continues to be plagued by severe bilateral knee pain when walking, worse on the right.   12-lead EKG today, reviewed by me, indicates NSR 89 bpm; no ischemic changes  Allergies  Allergen Reactions  . Iodinated Diagnostic Agents   . Penicillins     Current Outpatient Prescriptions  Medication Sig Dispense Refill  . ALPRAZolam (XANAX) 0.5 MG tablet Take 0.25 mg by mouth at bedtime.       Marland Kitchen aspirin 81 MG tablet Take 81 mg by mouth daily.      Marland Kitchen atorvastatin (LIPITOR) 40 MG tablet Take 40 mg by mouth every evening.      Marland Kitchen dexlansoprazole (DEXILANT) 60 MG capsule Take 60 mg by mouth daily.        . fluvoxaMINE (LUVOX) 100 MG tablet Take 50 mg by mouth daily.       Marland Kitchen HYDROcodone-acetaminophen (NORCO/VICODIN) 5-325 MG per tablet Take 1 tablet by mouth every 8 (eight) hours as needed.       . isosorbide mononitrate (IMDUR) 30 MG 24 hr tablet Take 30 mg by mouth daily.      . metoprolol succinate (TOPROL-XL) 50 MG 24 hr tablet Take 1 tablet (50 mg total) by mouth daily. Take with or immediately following a meal.  30 tablet  6  . mirtazapine (REMERON) 30 MG tablet Take 30 mg by mouth at bedtime.       No current facility-administered medications for this visit.    Past Medical History  Diagnosis Date  .  Hyperlipidemia   . Hypertension   . Osteoarthritis   . Myocardial infarction     IMI 2008  . CAD (coronary artery disease)     BMS to RCA 2008, [facility] cardiology, Dr. Katrinka Blazing  . Allergic reaction to contrast dye     Past Surgical History  Procedure Laterality Date  . Vasectomy    . Total knee arthroplasty      Left    History   Social History  . Marital Status: Married    Spouse Name: N/A    Number of Children: N/A  . Years of Education: N/A   Occupational History  . disabled due to arthritis    Social History Main Topics  . Smoking status: Never Smoker   . Smokeless tobacco: Never Used  . Alcohol Use: Not on file  . Drug Use: Not on file  . Sexually Active: Not on file   Other Topics Concern  . Not on file   Social History Narrative   Married    Family History  Problem Relation Age of Onset  . Heart disease Mother   . Heart disease Father     ROS: no nausea, vomiting; no fever, chills; no melena, hematochezia; no claudication  PHYSICAL EXAM: BP 134/76  Pulse 89  Ht 5\' 11"  (1.803 m)  Wt 252 lb (114.306 kg)  BMI  35.16 kg/m2 GENERAL: 62 year old male; NAD  HEENT: NCAT, PERRLA, EOMI; sclera clear; no xanthelasma  NECK: palpable bilateral carotid pulses, no bruits; no JVD; no TM  LUNGS: CTA bilaterally  CARDIAC: RRR (S1, S2); no significant murmurs; no rubs or gallops  ABDOMEN: soft, protuberant  EXTREMETIES: trace peripheral edema  SKIN: warm/dry; no obvious rash/lesions  MUSCULOSKELETAL: no joint deformity  NEURO: no focal deficit; NL affect    EKG:    ASSESSMENT & PLAN:  CORONARY ATHEROSCLEROSIS NATIVE CORONARY ARTERY The results of the recent abnormal stress test were reviewed with both the patient and his wife, in addition with Dr. Diona Browner. In the absence of any exertional symptoms of CP, and given the relatively overall low risk assessment of his recent stress test, by Dr. Diona Browner, we recommend that he proceed with knee surgery, as planned.  He is deemed to be at an acceptable risk from a cardiac standpoint, and we recommend that he resume all of his cardiac medications, following surgery. Prescription for NTG will be renewed.    Gene Mirai Greenwood, PAC

## 2012-09-15 NOTE — Assessment & Plan Note (Signed)
The results of the recent abnormal stress test were reviewed with both the patient and his wife, in addition with Dr. Diona Browner. In the absence of any exertional symptoms of CP, and given the relatively overall low risk assessment of his recent stress test, by Dr. Diona Browner, we recommend that he proceed with knee surgery, as planned. He is deemed to be at an acceptable risk from a cardiac standpoint, and we recommend that he resume all of his cardiac medications, following surgery. Prescription for NTG will be renewed.

## 2012-09-16 ENCOUNTER — Other Ambulatory Visit: Payer: Self-pay | Admitting: Cardiology

## 2012-10-12 ENCOUNTER — Encounter (HOSPITAL_COMMUNITY): Payer: Self-pay | Admitting: Pharmacy Technician

## 2012-10-13 ENCOUNTER — Encounter (HOSPITAL_COMMUNITY)
Admission: RE | Admit: 2012-10-13 | Discharge: 2012-10-13 | Disposition: A | Discharge status: Home / Self Care | Payer: Medicare Other | Source: Ambulatory Visit | Attending: Orthopedic Surgery | Admitting: Orthopedic Surgery

## 2012-10-13 ENCOUNTER — Encounter (HOSPITAL_COMMUNITY): Payer: Self-pay

## 2012-10-13 ENCOUNTER — Ambulatory Visit (HOSPITAL_COMMUNITY)
Admission: RE | Admit: 2012-10-13 | Discharge: 2012-10-13 | Disposition: A | Discharge status: Home / Self Care | Payer: Medicare Other | Source: Ambulatory Visit | Attending: Physician Assistant | Admitting: Physician Assistant

## 2012-10-13 ENCOUNTER — Other Ambulatory Visit: Payer: Self-pay | Admitting: Physician Assistant

## 2012-10-13 DIAGNOSIS — J9819 Other pulmonary collapse: Secondary | ICD-10-CM | POA: Insufficient documentation

## 2012-10-13 DIAGNOSIS — Z01812 Encounter for preprocedural laboratory examination: Secondary | ICD-10-CM | POA: Insufficient documentation

## 2012-10-13 DIAGNOSIS — Z01818 Encounter for other preprocedural examination: Secondary | ICD-10-CM | POA: Insufficient documentation

## 2012-10-13 DIAGNOSIS — M1710 Unilateral primary osteoarthritis, unspecified knee: Secondary | ICD-10-CM | POA: Insufficient documentation

## 2012-10-13 HISTORY — DX: Tinnitus, right ear: H93.11

## 2012-10-13 HISTORY — DX: Gastro-esophageal reflux disease without esophagitis: K21.9

## 2012-10-13 HISTORY — DX: Bipolar disorder, unspecified: F31.9

## 2012-10-13 HISTORY — DX: Unspecified hearing loss, unspecified ear: H91.90

## 2012-10-13 LAB — PROTIME-INR: INR: 1.03 (ref 0.00–1.49)

## 2012-10-13 LAB — COMPREHENSIVE METABOLIC PANEL
AST: 22 U/L (ref 0–37)
Albumin: 3.9 g/dL (ref 3.5–5.2)
Calcium: 9.1 mg/dL (ref 8.4–10.5)
Creatinine, Ser: 1.11 mg/dL (ref 0.50–1.35)
Total Protein: 7 g/dL (ref 6.0–8.3)

## 2012-10-13 LAB — CBC WITH DIFFERENTIAL/PLATELET
Basophils Absolute: 0 10*3/uL (ref 0.0–0.1)
Basophils Relative: 0 % (ref 0–1)
Eosinophils Relative: 3 % (ref 0–5)
HCT: 44.2 % (ref 39.0–52.0)
MCHC: 33.7 g/dL (ref 30.0–36.0)
MCV: 78.1 fL (ref 78.0–100.0)
Monocytes Absolute: 0.8 10*3/uL (ref 0.1–1.0)
Platelets: 173 10*3/uL (ref 150–400)
RDW: 13.9 % (ref 11.5–15.5)

## 2012-10-13 LAB — TYPE AND SCREEN
ABO/RH(D): O POS
Antibody Screen: NEGATIVE

## 2012-10-13 LAB — SURGICAL PCR SCREEN
MRSA, PCR: NEGATIVE
Staphylococcus aureus: POSITIVE — AB

## 2012-10-13 LAB — URINALYSIS, ROUTINE W REFLEX MICROSCOPIC
Glucose, UA: NEGATIVE mg/dL
Ketones, ur: NEGATIVE mg/dL
Leukocytes, UA: NEGATIVE
Protein, ur: NEGATIVE mg/dL
Urobilinogen, UA: 1 mg/dL (ref 0.0–1.0)

## 2012-10-13 LAB — APTT: aPTT: 31 seconds (ref 24–37)

## 2012-10-13 NOTE — Progress Notes (Signed)
STOP-Bang score 6; note faxed to PCP, Dr Clelia Croft in [place], [place]

## 2012-10-13 NOTE — Pre-Procedure Instructions (Signed)
CULLY LUCKOW  10/13/2012   Your procedure is scheduled on:  Friday, May 23  Report to [name] at 0530 AM.  Call this number if you have problems the morning of surgery: [phone]   Remember:   Do not eat food or drink liquids after midnight.Thursday night   Take these medicines the morning of surgery with A SIP OF WATER: Dexilant, Isosorbide (Imdur),Metoprolol,Hydrocodone, If needed,Luvox   Do not wear jewelry, make-up or nail polish.  Do not wear lotions, powders, perfumes or deodorant.  Do not shave 48 hours prior to surgery. Men may shave face and neck.  Do not bring valuables to the hospital.  Contacts, dentures or bridgework may not be worn into surgery.  Leave suitcase in the car. After surgery it may be brought to your room.  For patients admitted to the hospital, checkout time is 11:00 AM the day of  discharge.      Special Instructions: Shower using CHG 2 nights before surgery and the night before surgery.  If you shower the day of surgery use CHG.  Use special wash - you have one bottle of CHG for all showers.  You should use approximately 1/3 of the bottle for each shower.   Please read over the following fact sheets that you were given: Pain Booklet, Coughing and Deep Breathing, Blood Transfusion Information, Total Joint Packet and Surgical Site Infection Prevention

## 2012-10-14 LAB — URINE CULTURE: Colony Count: NO GROWTH

## 2012-10-20 MED ORDER — ACETAMINOPHEN 10 MG/ML IV SOLN
1000.0000 mg | Freq: Once | INTRAVENOUS | Status: AC
  Administered 2012-10-21: 1000 mg via INTRAVENOUS
  Filled 2012-10-20: qty 100

## 2012-10-20 MED ORDER — CHLORHEXIDINE GLUCONATE 4 % EX LIQD: 60.0000 mL | Freq: Once | CUTANEOUS | Status: DC

## 2012-10-20 MED ORDER — SODIUM CHLORIDE 0.9 % IV SOLN: INTRAVENOUS | Status: DC

## 2012-10-20 MED ORDER — BUPIVACAINE-EPINEPHRINE PF 0.25-1:200000 % IJ SOLN
INTRAMUSCULAR | Status: AC
  Filled 2012-10-20: qty 30

## 2012-10-20 MED ORDER — CLINDAMYCIN PHOSPHATE 900 MG/50ML IV SOLN
900.0000 mg | INTRAVENOUS | Status: AC
  Administered 2012-10-21: 900 mg via INTRAVENOUS
  Filled 2012-10-20: qty 50

## 2012-10-20 NOTE — H&P (Signed)
TOTAL KNEE ADMISSION H&P  Patient is being admitted for right total knee arthroplasty.  Subjective:  Chief Complaint:right knee pain.  HPI: Michael Rose, 62 y.o. male, has a history of pain and functional disability in the right knee due to arthritis and has failed non-surgical conservative treatments for greater than 12 weeks to includeNSAID's and/or analgesics, corticosteriod injections, use of assistive devices and activity modification.  Onset of symptoms was gradual, starting 5 years ago with gradually worsening course since that time. The patient noted prior procedures on the knee to include  arthroscopy and menisectomy on the right knee(s).  Patient currently rates pain in the right knee(s) at 8 out of 10 with activity. Patient has night pain, worsening of pain with activity and weight bearing, pain that interferes with activities of daily living, pain with passive range of motion, crepitus and joint swelling.  Patient has evidence of periarticular osteophytes and joint space narrowing by imaging studies.There is no active infection.  Patient Active Problem List   Diagnosis Date Noted  . Mixed hyperlipidemia 07/08/2010  . MORBID OBESITY 07/08/2010  . Essential hypertension, benign 07/08/2010  . CORONARY ATHEROSCLEROSIS NATIVE CORONARY ARTERY 07/08/2010   Past Medical History  Diagnosis Date  . Hyperlipidemia   . Hypertension   . Osteoarthritis   . Myocardial infarction     IMI 2008  . CAD (coronary artery disease)     BMS to RCA 2008, [facility] cardiology, Dr. Katrinka Blazing  . Allergic reaction to contrast dye   . Bipolar affective disorder     on meds  . History of nuclear stress test 08/2012  . GERD (gastroesophageal reflux disease)   . Ringing in right ear   . Hard of hearing     right ear    Past Surgical History  Procedure Laterality Date  . Vasectomy    . Total knee arthroplasty      Left  . Joint replacement    . Coronary angioplasty Right 2008    Dr Verdis Prime  .  Cholecystectomy  2013     (Not in a hospital admission) Allergies  Allergen Reactions  . Iodinated Diagnostic Agents Hives and Itching  . Penicillins Other (See Comments)    chillhood allergy     History  Substance Use Topics  . Smoking status: Never Smoker   . Smokeless tobacco: Never Used  . Alcohol Use: No    Family History  Problem Relation Age of Onset  . Heart disease Mother   . Heart disease Father      Review of Systems  Constitutional: Negative.   HENT: Positive for hearing loss and tinnitus. Negative for ear pain, nosebleeds, sore throat and ear discharge.   Eyes: Negative.   Respiratory: Negative.   Cardiovascular: Positive for chest pain. Negative for palpitations, orthopnea, claudication and leg swelling.  Gastrointestinal: Negative.   Genitourinary: Negative.   Musculoskeletal: Positive for back pain and joint pain. Negative for falls.  Skin: Negative.   Neurological: Negative.  Negative for headaches.  Endo/Heme/Allergies: Negative.   Psychiatric/Behavioral: Negative for suicidal ideas and substance abuse.    Objective:  Physical Exam  Constitutional: He is oriented to person, place, and time. He appears well-developed and well-nourished. No distress.  HENT:  Head: Normocephalic and atraumatic.  Nose: Nose normal.  Eyes: Conjunctivae and EOM are normal. Pupils are equal, round, and reactive to light.  Neck: Normal range of motion. Neck supple.  Cardiovascular: Normal rate, regular rhythm, normal heart sounds and intact distal pulses.  Respiratory: Effort normal and breath sounds normal. No respiratory distress. He has no wheezes.  GI: Soft. Bowel sounds are normal. He exhibits no distension. There is no tenderness.  Musculoskeletal:       Right knee: He exhibits decreased range of motion and swelling. Tenderness found. Medial joint line and lateral joint line tenderness noted.  Lymphadenopathy:    He has no cervical adenopathy.  Neurological: He is  alert and oriented to person, place, and time. No cranial nerve deficit.  Skin: Skin is warm and dry. No rash noted. No erythema.  Psychiatric: He has a normal mood and affect. His behavior is normal.    Vital signs in last 24 hours: @VSRANGES @  Labs:   Estimated body mass index is 35.66 kg/(m^2) as calculated from the following:   Height as of 09/15/12: 5\' 11"  (1.803 m).   Weight as of an earlier encounter on 10/13/12: 115.939 kg (255 lb 9.6 oz).   Imaging Review Plain radiographs demonstrate severe degenerative joint disease of the right knee(s). The overall alignment ismild varus. The bone quality appears to be good for age and reported activity level.  Assessment/Plan:  End stage arthritis, right knee   The patient history, physical examination, clinical judgment of the provider and imaging studies are consistent with end stage degenerative joint disease of the right knee(s) and total knee arthroplasty is deemed medically necessary. The treatment options including medical management, injection therapy arthroscopy and arthroplasty were discussed at length. The risks and benefits of total knee arthroplasty were presented and reviewed. The risks due to aseptic loosening, infection, stiffness, patella tracking problems, thromboembolic complications and other imponderables were discussed. The patient acknowledged the explanation, agreed to proceed with the plan and consent was signed. Patient is being admitted for inpatient treatment for surgery, pain control, PT, OT, prophylactic antibiotics, VTE prophylaxis, progressive ambulation and ADL's and discharge planning. The patient is planning to be discharged to skilled nursing facility [facility].

## 2012-10-21 ENCOUNTER — Inpatient Hospital Stay (HOSPITAL_COMMUNITY): Payer: Medicare Other | Admitting: Certified Registered"

## 2012-10-21 ENCOUNTER — Encounter (HOSPITAL_COMMUNITY)
Admission: RE | Disposition: A | Discharge status: Skilled Nursing Facility | Payer: Self-pay | Source: Ambulatory Visit | Attending: Orthopedic Surgery

## 2012-10-21 ENCOUNTER — Encounter (HOSPITAL_COMMUNITY): Payer: Self-pay | Admitting: Certified Registered"

## 2012-10-21 ENCOUNTER — Inpatient Hospital Stay (HOSPITAL_COMMUNITY)
Admission: RE | Admit: 2012-10-21 | Discharge: 2012-10-25 | DRG: 470 | Disposition: A | Discharge status: Skilled Nursing Facility | Payer: Medicare Other | Source: Ambulatory Visit | Attending: Orthopedic Surgery | Admitting: Orthopedic Surgery

## 2012-10-21 ENCOUNTER — Encounter (HOSPITAL_COMMUNITY): Payer: Self-pay | Admitting: *Deleted

## 2012-10-21 DIAGNOSIS — Z8249 Family history of ischemic heart disease and other diseases of the circulatory system: Secondary | ICD-10-CM

## 2012-10-21 DIAGNOSIS — Z7901 Long term (current) use of anticoagulants: Secondary | ICD-10-CM

## 2012-10-21 DIAGNOSIS — H9190 Unspecified hearing loss, unspecified ear: Secondary | ICD-10-CM | POA: Diagnosis present

## 2012-10-21 DIAGNOSIS — Z9852 Vasectomy status: Secondary | ICD-10-CM

## 2012-10-21 DIAGNOSIS — Z79899 Other long term (current) drug therapy: Secondary | ICD-10-CM

## 2012-10-21 DIAGNOSIS — I10 Essential (primary) hypertension: Secondary | ICD-10-CM | POA: Diagnosis present

## 2012-10-21 DIAGNOSIS — M1710 Unilateral primary osteoarthritis, unspecified knee: Principal | ICD-10-CM | POA: Diagnosis present

## 2012-10-21 DIAGNOSIS — Z91041 Radiographic dye allergy status: Secondary | ICD-10-CM

## 2012-10-21 DIAGNOSIS — E782 Mixed hyperlipidemia: Secondary | ICD-10-CM | POA: Diagnosis present

## 2012-10-21 DIAGNOSIS — F319 Bipolar disorder, unspecified: Secondary | ICD-10-CM | POA: Diagnosis present

## 2012-10-21 DIAGNOSIS — Z9861 Coronary angioplasty status: Secondary | ICD-10-CM

## 2012-10-21 DIAGNOSIS — I252 Old myocardial infarction: Secondary | ICD-10-CM

## 2012-10-21 DIAGNOSIS — K219 Gastro-esophageal reflux disease without esophagitis: Secondary | ICD-10-CM | POA: Diagnosis present

## 2012-10-21 DIAGNOSIS — I2510 Atherosclerotic heart disease of native coronary artery without angina pectoris: Secondary | ICD-10-CM | POA: Diagnosis present

## 2012-10-21 DIAGNOSIS — Z96659 Presence of unspecified artificial knee joint: Secondary | ICD-10-CM

## 2012-10-21 DIAGNOSIS — Z6835 Body mass index (BMI) 35.0-35.9, adult: Secondary | ICD-10-CM

## 2012-10-21 HISTORY — PX: TOTAL KNEE ARTHROPLASTY: SHX125

## 2012-10-21 LAB — CREATININE, SERUM
Creatinine, Ser: 1.22 mg/dL (ref 0.50–1.35)
GFR calc non Af Amer: 62 mL/min — ABNORMAL LOW (ref >90)

## 2012-10-21 LAB — CBC
Hemoglobin: 13.4 g/dL (ref 13.0–17.0)
RBC: 5.01 MIL/uL (ref 4.22–5.81)

## 2012-10-21 SURGERY — ARTHROPLASTY, KNEE, TOTAL
Anesthesia: General | Site: Knee | Laterality: Right | Wound class: Clean

## 2012-10-21 MED ORDER — SENNOSIDES-DOCUSATE SODIUM 8.6-50 MG PO TABS: 1.0000 | ORAL_TABLET | Freq: Every evening | ORAL | Status: DC | PRN

## 2012-10-21 MED ORDER — ONDANSETRON HCL 4 MG/2ML IJ SOLN
4.0000 mg | Freq: Four times a day (QID) | INTRAMUSCULAR | Status: DC | PRN
  Administered 2012-10-21 – 2012-10-22 (×2): 4 mg via INTRAVENOUS
  Filled 2012-10-21 (×2): qty 2

## 2012-10-21 MED ORDER — HYDROMORPHONE HCL PF 1 MG/ML IJ SOLN: 1.0000 mg | INTRAMUSCULAR | Status: DC | PRN

## 2012-10-21 MED ORDER — MIRTAZAPINE 30 MG PO TABS
30.0000 mg | ORAL_TABLET | Freq: Every day | ORAL | Status: DC
  Administered 2012-10-21 – 2012-10-24 (×4): 30 mg via ORAL
  Filled 2012-10-21 (×5): qty 1

## 2012-10-21 MED ORDER — HYDROMORPHONE HCL PF 1 MG/ML IJ SOLN
INTRAMUSCULAR | Status: AC
  Filled 2012-10-21: qty 1

## 2012-10-21 MED ORDER — PANTOPRAZOLE SODIUM 40 MG PO TBEC
40.0000 mg | DELAYED_RELEASE_TABLET | Freq: Every day | ORAL | Status: DC
  Administered 2012-10-22 – 2012-10-25 (×4): 40 mg via ORAL
  Filled 2012-10-21 (×4): qty 1

## 2012-10-21 MED ORDER — HYDROMORPHONE HCL PF 1 MG/ML IJ SOLN
1.0000 mg | INTRAMUSCULAR | Status: DC | PRN
  Administered 2012-10-22: 1 mg via INTRAVENOUS
  Filled 2012-10-21: qty 1

## 2012-10-21 MED ORDER — CLINDAMYCIN PHOSPHATE 600 MG/50ML IV SOLN
600.0000 mg | Freq: Four times a day (QID) | INTRAVENOUS | Status: AC
  Administered 2012-10-21 (×2): 600 mg via INTRAVENOUS
  Filled 2012-10-21 (×2): qty 50

## 2012-10-21 MED ORDER — FLUVOXAMINE MALEATE 100 MG PO TABS
100.0000 mg | ORAL_TABLET | Freq: Every day | ORAL | Status: DC
  Administered 2012-10-22 – 2012-10-25 (×4): 100 mg via ORAL
  Filled 2012-10-21 (×6): qty 1

## 2012-10-21 MED ORDER — MIDAZOLAM HCL 5 MG/5ML IJ SOLN
INTRAMUSCULAR | Status: DC | PRN
  Administered 2012-10-21: 2 mg via INTRAVENOUS

## 2012-10-21 MED ORDER — ONDANSETRON HCL 4 MG PO TABS
4.0000 mg | ORAL_TABLET | Freq: Four times a day (QID) | ORAL | Status: DC | PRN
  Administered 2012-10-25: 4 mg via ORAL
  Filled 2012-10-21: qty 1

## 2012-10-21 MED ORDER — SODIUM CHLORIDE 0.9 % IV SOLN
INTRAVENOUS | Status: DC
  Administered 2012-10-21 – 2012-10-22 (×2): via INTRAVENOUS

## 2012-10-21 MED ORDER — ENOXAPARIN SODIUM 30 MG/0.3ML ~~LOC~~ SOLN
30.0000 mg | Freq: Two times a day (BID) | SUBCUTANEOUS | Status: DC
Start: 2012-10-22 — End: 2012-10-25
  Administered 2012-10-22 – 2012-10-25 (×7): 30 mg via SUBCUTANEOUS
  Filled 2012-10-21 (×9): qty 0.3

## 2012-10-21 MED ORDER — ACETAMINOPHEN 325 MG PO TABS
650.0000 mg | ORAL_TABLET | Freq: Four times a day (QID) | ORAL | Status: DC | PRN
  Administered 2012-10-23: 650 mg via ORAL
  Filled 2012-10-21: qty 2

## 2012-10-21 MED ORDER — OXYCODONE HCL 5 MG PO TABS
ORAL_TABLET | ORAL | Status: AC
  Administered 2012-10-21: 10 mg via ORAL
  Filled 2012-10-21: qty 2

## 2012-10-21 MED ORDER — METOCLOPRAMIDE HCL 10 MG PO TABS: 5.0000 mg | ORAL_TABLET | Freq: Three times a day (TID) | ORAL | Status: DC | PRN

## 2012-10-21 MED ORDER — OXYCODONE-ACETAMINOPHEN 5-325 MG PO TABS: ORAL_TABLET | ORAL | Status: DC

## 2012-10-21 MED ORDER — ROCURONIUM BROMIDE 100 MG/10ML IV SOLN
INTRAVENOUS | Status: DC | PRN
  Administered 2012-10-21: 50 mg via INTRAVENOUS

## 2012-10-21 MED ORDER — PROPOFOL 10 MG/ML IV BOLUS
INTRAVENOUS | Status: DC | PRN
  Administered 2012-10-21: 200 mg via INTRAVENOUS

## 2012-10-21 MED ORDER — SODIUM CHLORIDE 0.9 % IR SOLN
Status: DC | PRN
  Administered 2012-10-21: 3000 mL

## 2012-10-21 MED ORDER — LACTATED RINGERS IV SOLN
INTRAVENOUS | Status: DC | PRN
  Administered 2012-10-21 (×2): via INTRAVENOUS

## 2012-10-21 MED ORDER — PHENOL 1.4 % MT LIQD: 1.0000 | OROMUCOSAL | Status: DC | PRN

## 2012-10-21 MED ORDER — DOCUSATE SODIUM 100 MG PO CAPS
100.0000 mg | ORAL_CAPSULE | Freq: Two times a day (BID) | ORAL | Status: DC
  Administered 2012-10-21 – 2012-10-25 (×8): 100 mg via ORAL
  Filled 2012-10-21 (×8): qty 1

## 2012-10-21 MED ORDER — ACETAMINOPHEN 650 MG RE SUPP: 650.0000 mg | Freq: Four times a day (QID) | RECTAL | Status: DC | PRN

## 2012-10-21 MED ORDER — FLEET ENEMA 7-19 GM/118ML RE ENEM: 1.0000 | ENEMA | Freq: Once | RECTAL | Status: AC | PRN

## 2012-10-21 MED ORDER — ACETAMINOPHEN 10 MG/ML IV SOLN
INTRAVENOUS | Status: AC
  Filled 2012-10-21: qty 100

## 2012-10-21 MED ORDER — DIPHENHYDRAMINE HCL 12.5 MG/5ML PO ELIX
12.5000 mg | ORAL_SOLUTION | ORAL | Status: DC | PRN
  Administered 2012-10-23: 25 mg via ORAL
  Filled 2012-10-21: qty 10

## 2012-10-21 MED ORDER — HYDROMORPHONE HCL PF 1 MG/ML IJ SOLN: 0.2500 mg | INTRAMUSCULAR | Status: DC | PRN

## 2012-10-21 MED ORDER — BISACODYL 10 MG RE SUPP
10.0000 mg | Freq: Every day | RECTAL | Status: DC | PRN
  Administered 2012-10-23 – 2012-10-25 (×2): 10 mg via RECTAL
  Filled 2012-10-21 (×2): qty 1

## 2012-10-21 MED ORDER — PHENYLEPHRINE HCL 10 MG/ML IJ SOLN
INTRAMUSCULAR | Status: DC | PRN
  Administered 2012-10-21 (×2): 80 ug via INTRAVENOUS
  Administered 2012-10-21: 120 ug via INTRAVENOUS
  Administered 2012-10-21: 40 ug via INTRAVENOUS
  Administered 2012-10-21: 80 ug via INTRAVENOUS

## 2012-10-21 MED ORDER — METHOCARBAMOL 500 MG PO TABS
500.0000 mg | ORAL_TABLET | Freq: Four times a day (QID) | ORAL | Status: DC | PRN
  Administered 2012-10-22 – 2012-10-25 (×9): 500 mg via ORAL
  Filled 2012-10-21 (×9): qty 1

## 2012-10-21 MED ORDER — ALPRAZOLAM 0.25 MG PO TABS
0.2500 mg | ORAL_TABLET | Freq: Every day | ORAL | Status: DC
  Administered 2012-10-21 – 2012-10-24 (×4): 0.25 mg via ORAL
  Filled 2012-10-21 (×4): qty 1

## 2012-10-21 MED ORDER — SUFENTANIL CITRATE 50 MCG/ML IV SOLN
INTRAVENOUS | Status: DC | PRN
  Administered 2012-10-21: 20 ug via INTRAVENOUS
  Administered 2012-10-21: 10 ug via INTRAVENOUS

## 2012-10-21 MED ORDER — SODIUM CHLORIDE 0.9 % IV SOLN
10.0000 mg | INTRAVENOUS | Status: DC | PRN
  Administered 2012-10-21: 50 ug/min via INTRAVENOUS

## 2012-10-21 MED ORDER — METOPROLOL SUCCINATE ER 50 MG PO TB24
50.0000 mg | ORAL_TABLET | Freq: Every day | ORAL | Status: DC
  Administered 2012-10-22 – 2012-10-25 (×4): 50 mg via ORAL
  Filled 2012-10-21 (×4): qty 1

## 2012-10-21 MED ORDER — METHOCARBAMOL 500 MG PO TABS
ORAL_TABLET | ORAL | Status: AC
  Administered 2012-10-21: 500 mg via ORAL
  Filled 2012-10-21: qty 1

## 2012-10-21 MED ORDER — LIDOCAINE HCL (CARDIAC) 20 MG/ML IV SOLN
INTRAVENOUS | Status: DC | PRN
  Administered 2012-10-21: 70 mg via INTRAVENOUS

## 2012-10-21 MED ORDER — METHOCARBAMOL 100 MG/ML IJ SOLN
500.0000 mg | Freq: Four times a day (QID) | INTRAVENOUS | Status: DC | PRN
  Filled 2012-10-21: qty 5

## 2012-10-21 MED ORDER — ENOXAPARIN SODIUM 30 MG/0.3ML ~~LOC~~ SOLN: 30.0000 mg | Freq: Two times a day (BID) | SUBCUTANEOUS | Status: DC

## 2012-10-21 MED ORDER — METOCLOPRAMIDE HCL 5 MG/ML IJ SOLN
5.0000 mg | Freq: Three times a day (TID) | INTRAMUSCULAR | Status: DC | PRN
  Administered 2012-10-21 – 2012-10-23 (×2): 10 mg via INTRAVENOUS
  Filled 2012-10-21 (×2): qty 2

## 2012-10-21 MED ORDER — MENTHOL 3 MG MT LOZG: 1.0000 | LOZENGE | OROMUCOSAL | Status: DC | PRN

## 2012-10-21 MED ORDER — ATORVASTATIN CALCIUM 40 MG PO TABS
40.0000 mg | ORAL_TABLET | Freq: Every day | ORAL | Status: DC
  Administered 2012-10-22 – 2012-10-24 (×3): 40 mg via ORAL
  Filled 2012-10-21 (×5): qty 1

## 2012-10-21 MED ORDER — HYDROMORPHONE HCL PF 1 MG/ML IJ SOLN
1.0000 mg | INTRAMUSCULAR | Status: DC | PRN
  Administered 2012-10-21: 1 mg via INTRAVENOUS

## 2012-10-21 MED ORDER — NITROGLYCERIN 0.4 MG SL SUBL
0.4000 mg | SUBLINGUAL_TABLET | SUBLINGUAL | Status: DC | PRN
Start: 2012-10-21 — End: 2012-10-25

## 2012-10-21 MED ORDER — HYDROMORPHONE HCL PF 1 MG/ML IJ SOLN
0.2500 mg | INTRAMUSCULAR | Status: DC | PRN
  Administered 2012-10-21 (×2): 0.5 mg via INTRAVENOUS

## 2012-10-21 MED ORDER — OXYCODONE HCL 5 MG PO TABS
5.0000 mg | ORAL_TABLET | ORAL | Status: DC | PRN
  Administered 2012-10-22 – 2012-10-25 (×19): 10 mg via ORAL
  Filled 2012-10-21 (×19): qty 2

## 2012-10-21 MED ORDER — ISOSORBIDE MONONITRATE ER 30 MG PO TB24
30.0000 mg | ORAL_TABLET | Freq: Every day | ORAL | Status: DC
  Administered 2012-10-22 – 2012-10-25 (×4): 30 mg via ORAL
  Filled 2012-10-21 (×5): qty 1

## 2012-10-21 SURGICAL SUPPLY — 62 items
BANDAGE ELASTIC 4 VELCRO ST LF (GAUZE/BANDAGES/DRESSINGS) ×2 IMPLANT
BANDAGE ELASTIC 6 VELCRO ST LF (GAUZE/BANDAGES/DRESSINGS) ×2 IMPLANT
BANDAGE ESMARK 6X9 LF (GAUZE/BANDAGES/DRESSINGS) ×1 IMPLANT
BLADE SAGITTAL 25.0X1.19X90 (BLADE) ×2 IMPLANT
BLADE SAW SAG 90X13X1.27 (BLADE) ×2 IMPLANT
BNDG ESMARK 6X9 LF (GAUZE/BANDAGES/DRESSINGS) ×2
BOWL SMART MIX CTS (DISPOSABLE) ×2 IMPLANT
CEMENT HV SMART SET (Cement) ×4 IMPLANT
CLOTH BEACON ORANGE TIMEOUT ST (SAFETY) ×2 IMPLANT
COVER SURGICAL LIGHT HANDLE (MISCELLANEOUS) ×2 IMPLANT
CUFF TOURNIQUET SINGLE 34IN LL (TOURNIQUET CUFF) ×2 IMPLANT
CUFF TOURNIQUET SINGLE 44IN (TOURNIQUET CUFF) IMPLANT
DRAPE INCISE IOBAN 66X45 STRL (DRAPES) IMPLANT
DRAPE ORTHO SPLIT 77X108 STRL (DRAPES) ×2
DRAPE SURG ORHT 6 SPLT 77X108 (DRAPES) ×2 IMPLANT
DRAPE U-SHAPE 47X51 STRL (DRAPES) ×2 IMPLANT
DRSG ADAPTIC 3X8 NADH LF (GAUZE/BANDAGES/DRESSINGS) ×2 IMPLANT
DRSG PAD ABDOMINAL 8X10 ST (GAUZE/BANDAGES/DRESSINGS) ×2 IMPLANT
DURAPREP 26ML APPLICATOR (WOUND CARE) ×2 IMPLANT
ELECT REM PT RETURN 9FT ADLT (ELECTROSURGICAL) ×2
ELECTRODE REM PT RTRN 9FT ADLT (ELECTROSURGICAL) ×1 IMPLANT
EVACUATOR 1/8 PVC DRAIN (DRAIN) ×2 IMPLANT
FACESHIELD LNG OPTICON STERILE (SAFETY) ×4 IMPLANT
FLOSEAL 10ML (HEMOSTASIS) IMPLANT
GLOVE BIO SURGEON STRL SZ 6.5 (GLOVE) ×4 IMPLANT
GLOVE BIOGEL PI IND STRL 6.5 (GLOVE) ×5 IMPLANT
GLOVE BIOGEL PI IND STRL 8 (GLOVE) ×4 IMPLANT
GLOVE BIOGEL PI INDICATOR 6.5 (GLOVE) ×5
GLOVE BIOGEL PI INDICATOR 8 (GLOVE) ×4
GLOVE ORTHO TXT STRL SZ7.5 (GLOVE) ×6 IMPLANT
GLOVE SURG ORTHO 8.0 STRL STRW (GLOVE) ×12 IMPLANT
GLOVE SURG SS PI 6.5 STRL IVOR (GLOVE) ×10 IMPLANT
GOWN PREVENTION PLUS XLARGE (GOWN DISPOSABLE) ×2 IMPLANT
GOWN PREVENTION PLUS XXLARGE (GOWN DISPOSABLE) ×2 IMPLANT
GOWN STRL NON-REIN LRG LVL3 (GOWN DISPOSABLE) ×4 IMPLANT
HANDPIECE INTERPULSE COAX TIP (DISPOSABLE) ×1
HOOD PEEL AWAY FACE SHEILD DIS (HOOD) ×2 IMPLANT
IMMOBILIZER KNEE 22 UNIV (SOFTGOODS) ×2 IMPLANT
KIT BASIN OR (CUSTOM PROCEDURE TRAY) ×2 IMPLANT
KIT ROOM TURNOVER OR (KITS) ×2 IMPLANT
MANIFOLD NEPTUNE II (INSTRUMENTS) ×2 IMPLANT
NEEDLE 22X1 1/2 (OR ONLY) (NEEDLE) IMPLANT
NS IRRIG 1000ML POUR BTL (IV SOLUTION) ×2 IMPLANT
PACK TOTAL JOINT (CUSTOM PROCEDURE TRAY) ×2 IMPLANT
PAD ARMBOARD 7.5X6 YLW CONV (MISCELLANEOUS) ×4 IMPLANT
PAD CAST 4YDX4 CTTN HI CHSV (CAST SUPPLIES) ×1 IMPLANT
PADDING CAST COTTON 4X4 STRL (CAST SUPPLIES) ×1
PADDING CAST COTTON 6X4 STRL (CAST SUPPLIES) ×2 IMPLANT
SET HNDPC FAN SPRY TIP SCT (DISPOSABLE) ×1 IMPLANT
SPONGE GAUZE 4X4 12PLY (GAUZE/BANDAGES/DRESSINGS) ×2 IMPLANT
STAPLER VISISTAT 35W (STAPLE) ×2 IMPLANT
SUCTION FRAZIER TIP 10 FR DISP (SUCTIONS) ×2 IMPLANT
SUT ETHIBOND NAB CT1 #1 30IN (SUTURE) ×6 IMPLANT
SUT VIC AB 0 CT1 27 (SUTURE) ×1
SUT VIC AB 0 CT1 27XBRD ANBCTR (SUTURE) ×1 IMPLANT
SUT VIC AB 2-0 CT1 27 (SUTURE) ×2
SUT VIC AB 2-0 CT1 TAPERPNT 27 (SUTURE) ×2 IMPLANT
SYR CONTROL 10ML LL (SYRINGE) IMPLANT
TOWEL OR 17X24 6PK STRL BLUE (TOWEL DISPOSABLE) ×2 IMPLANT
TOWEL OR 17X26 10 PK STRL BLUE (TOWEL DISPOSABLE) ×2 IMPLANT
TRAY FOLEY CATH 14FR (SET/KITS/TRAYS/PACK) ×2 IMPLANT
WATER STERILE IRR 1000ML POUR (IV SOLUTION) ×4 IMPLANT

## 2012-10-21 NOTE — Progress Notes (Signed)
Utilization Review Completed.   Zoejane Gaulin, RN, BSN Nurse Case Manager  [phone]  

## 2012-10-21 NOTE — Anesthesia Preprocedure Evaluation (Addendum)
Anesthesia Evaluation  Patient identified by MRN, date of birth, ID band Patient awake    Reviewed: Allergy & Precautions, H&P , NPO status , Patient's Chart, lab work & pertinent test results  Airway Mallampati: II      Dental   Pulmonary neg pulmonary ROS,  breath sounds clear to auscultation        Cardiovascular hypertension, + CAD and + Past MI Rhythm:Regular Rate:Normal     Neuro/Psych Anxiety    GI/Hepatic Neg liver ROS, GERD-  ,  Endo/Other  negative endocrine ROS  Renal/GU negative Renal ROS     Musculoskeletal   Abdominal   Peds  Hematology   Anesthesia Other Findings   Reproductive/Obstetrics                         Anesthesia Physical Anesthesia Plan  ASA: III  Anesthesia Plan: General   Post-op Pain Management: MAC Combined w/ Regional for Post-op pain   Induction: Intravenous  Airway Management Planned: Oral ETT  Additional Equipment:   Intra-op Plan:   Post-operative Plan: Extubation in OR  Informed Consent:   Dental advisory given  Plan Discussed with: CRNA, Anesthesiologist and Surgeon  Anesthesia Plan Comments:        Anesthesia Quick Evaluation

## 2012-10-21 NOTE — Anesthesia Postprocedure Evaluation (Signed)
  Anesthesia Post-op Note  Patient: Michael Rose  Procedure(s) Performed: Procedure(s): TOTAL KNEE ARTHROPLASTY (Right)  Patient Location: PACU  Anesthesia Type:General  Level of Consciousness: awake  Airway and Oxygen Therapy: Patient Spontanous Breathing  Post-op Pain: mild  Post-op Assessment: Post-op Vital signs reviewed  Post-op Vital Signs: Reviewed  Complications: No apparent anesthesia complications

## 2012-10-21 NOTE — Interval H&P Note (Signed)
History and Physical Interval Note:  10/21/2012 7:34 AM  Michael Rose  has presented today for surgery, with the diagnosis of OA R knee  The various methods of treatment have been discussed with the patient and family. After consideration of risks, benefits and other options for treatment, the patient has consented to  Procedure(s): TOTAL KNEE ARTHROPLASTY (Right) as a surgical intervention .  The patient's history has been reviewed, patient examined, no change in status, stable for surgery.  I have reviewed the patient's chart and labs.  Questions were answered to the patient's satisfaction.     Shawntel Farnworth JR,W D

## 2012-10-21 NOTE — Preoperative (Signed)
Beta Blockers   Reason not to administer Beta Blockers:Metoprolol taken at 0415 hrs on 10/21/12

## 2012-10-21 NOTE — Anesthesia Procedure Notes (Addendum)
Anesthesia Regional Block:  Femoral nerve block  Pre-Anesthetic Checklist: ,, timeout performed, Correct Patient, Correct Site, Correct Laterality, Correct Procedure, Correct Position, site marked, Risks and benefits discussed,  Surgical consent,  Pre-op evaluation,  At surgeon's request and post-op pain management  Laterality: Right     Needles:   Needle Type: Stimulator Needle - 80          Additional Needles:  Procedures: Doppler guided and nerve stimulator Femoral nerve block Narrative:  Start time: 10/21/2012 7:05 AM End time: 10/21/2012 7:20 AM Injection made incrementally with aspirations every 5 mL.  Performed by: Personally  Anesthesiologist: Dr. Randa Evens   Procedure Name: Intubation Date/Time: 10/21/2012 7:47 AM Performed by: Charm Barges, Jaise Moser R Pre-anesthesia Checklist: Patient identified, Emergency Drugs available, Suction available, Patient being monitored and Timeout performed Patient Re-evaluated:Patient Re-evaluated prior to inductionOxygen Delivery Method: Circle system utilized Preoxygenation: Pre-oxygenation with 100% oxygen Intubation Type: IV induction Ventilation: Mask ventilation without difficulty Laryngoscope Size: Mac and 4 Grade View: Grade III Tube type: Oral Tube size: 8.0 mm Number of attempts: 1 Airway Equipment and Method: Stylet Placement Confirmation: ETT inserted through vocal cords under direct vision,  positive ETCO2 and breath sounds checked- equal and bilateral Secured at: 23 cm Tube secured with: Tape Dental Injury: Teeth and Oropharynx as per pre-operative assessment  Difficulty Due To: Difficulty was unanticipated, Difficult Airway- due to anterior larynx and Difficult Airway- due to large tongue Comments: Recommend use of video laryngoscope in the future.

## 2012-10-21 NOTE — Care Management Note (Signed)
CARE MANAGEMENT NOTE 10/21/2012  Patient:  Michael Rose, Michael Rose   Account Number:  [id number]  Date Initiated:  10/21/2012  Documentation initiated by:  Vance Peper  Subjective/Objective Assessment:   62 yr old male s/p right total knee arthroplasty.     Action/Plan:   CM spoke with patient and wife. She states he will be going to [facility] for shortterm rehab. Social Worker is aware.   Anticipated DC Date:  10/25/2012   Anticipated DC Plan:  SKILLED NURSING FACILITY  In-house referral  Clinical Social Worker      DC Planning Services  CM consult      Choice offered to / List presented to:             Status of service:  Completed, signed off Medicare Important Message given?   (If response is "NO", the following Medicare IM given date fields will be blank) Date Medicare IM given:   Date Additional Medicare IM given:    Discharge Disposition:  SKILLED NURSING FACILITY  Per UR Regulation:    If discussed at Long Length of Stay Meetings, dates discussed:    Comments:

## 2012-10-21 NOTE — Progress Notes (Signed)
Orthopedic Tech Progress Note Patient Details:  Michael Rose 03/13/1951 [id number]  CPM Right Knee CPM Right Knee: On Right Knee Flexion (Degrees): 60 Right Knee Extension (Degrees): 0   Cammer, Mickie Bail 10/21/2012, 12:37 PM

## 2012-10-21 NOTE — Brief Op Note (Signed)
10/21/2012  10:16 AM  PATIENT:  [name]  62 y.o. male  PRE-OPERATIVE DIAGNOSIS:  OA R knee  POST-OPERATIVE DIAGNOSIS:  OA R knee  PROCEDURE:  Procedure(s): TOTAL KNEE ARTHROPLASTY (Right)  SURGEON:  Surgeon(s) and Role:    * W D Carloyn Manner., MD - Primary  PHYSICIAN ASSISTANT:   ASSISTANTS: Margart Sickles, PA-C   ANESTHESIA:   regional and general  EBL:  Total I/O In: 1400 [I.V.:1400] Out: 325 [Urine:250; Blood:75]  BLOOD ADMINISTERED:none  DRAINS: hemovac drain right knee self suction  LOCAL MEDICATIONS USED:  NONE  SPECIMEN:  No Specimen  DISPOSITION OF SPECIMEN:  N/A  COUNTS:  YES  TOURNIQUET:   Total Tourniquet Time Documented: Thigh (Right) - 79 minutes Total: Thigh (Right) - 79 minutes   DICTATION: .Other Dictation: Dictation Number unknown  PLAN OF CARE: Admit to inpatient   PATIENT DISPOSITION:  PACU - hemodynamically stable.   Delay start of Pharmacological VTE agent (>24hrs) due to surgical blood loss or risk of bleeding: yes

## 2012-10-21 NOTE — Evaluation (Signed)
Physical Therapy Evaluation Patient Details Name: Michael Rose MRN: [id number] DOB: [birth year] Today's Date: 10/21/2012 Time: 4098-1191 PT Time Calculation (min): 22 min  PT Assessment / Plan / Recommendation Clinical Impression  Pt is a 62 y.o. male s/p R TKA POD#0. Limited in therapy today due to dizziness and nausea; unable to attempt amb. Presents with mobility, ROM and R knee strength deficits. Will benefit from skilled PT to maximize functional mobility. Plans to D/C to SNF.    PT Assessment  Patient needs continued PT services    Follow Up Recommendations  SNF    Does the patient have the potential to tolerate intense rehabilitation      Barriers to Discharge        Equipment Recommendations  None recommended by PT    Recommendations for Other Services     Frequency 7X/week    Precautions / Restrictions Precautions Precautions: Fall;Knee Precaution Booklet Issued: Yes (comment) Precaution Comments: had  L TKA  4 years ago Required Braces or Orthoses: Knee Immobilizer - Right Knee Immobilizer - Right: On when out of bed or walking Restrictions Weight Bearing Restrictions: Yes RLE Weight Bearing: Weight bearing as tolerated Educated to not have pillow under knee.   Pertinent Vitals/Pain 8/10; pt premedicated.        Mobility  Bed Mobility Bed Mobility: Supine to Sit;Sitting - Scoot to Edge of Bed;Sit to Supine Supine to Sit: 4: Min guard;HOB elevated;With rails Sitting - Scoot to Edge of Bed: 5: Supervision Sit to Supine: 4: Min guard;HOB elevated;With rail Details for Bed Mobility Assistance: required verbal and tactile cues to advance R LE; increased time needed due to pain and R LE feeling "heavy"; pt became nauseous and dizzy sitting on EOB; deferred attempting transfer or amb at this time  Transfers Transfers: Not assessed Ambulation/Gait Ambulation/Gait Assistance: Not tested (comment) Stairs: No Wheelchair Mobility Wheelchair Mobility: No     Exercises Total Joint Exercises Ankle Circles/Pumps: AROM;Both;10 reps;Supine   PT Diagnosis: Difficulty walking;Acute pain  PT Problem List: Decreased strength;Decreased range of motion;Decreased balance;Decreased mobility;Decreased knowledge of use of DME;Decreased safety awareness;Decreased skin integrity PT Treatment Interventions: DME instruction;Gait training;Stair training;Functional mobility training;Therapeutic activities;Therapeutic exercise;Balance training;Neuromuscular re-education;Patient/family education   PT Goals Acute Rehab PT Goals PT Goal Formulation: With patient Time For Goal Achievement: 10/28/12 Potential to Achieve Goals: Good Pt will go Supine/Side to Sit: with modified independence PT Goal: Supine/Side to Sit - Progress: Goal set today Pt will go Sit to Supine/Side: with modified independence PT Goal: Sit to Supine/Side - Progress: Goal set today Pt will go Sit to Stand: with modified independence PT Goal: Sit to Stand - Progress: Goal set today Pt will go Stand to Sit: with modified independence PT Goal: Stand to Sit - Progress: Goal set today Pt will Ambulate: >150 feet;with modified independence;with rolling walker PT Goal: Ambulate - Progress: Goal set today Pt will Go Up / Down Stairs: Flight;with supervision;with rail(s);with least restrictive assistive device PT Goal: Up/Down Stairs - Progress: Goal set today  Visit Information  Last PT Received On: 10/21/12 Assistance Needed: +1    Subjective Data  Subjective: Lying supine; with wife present; "I am pretty drugged up but ill try to do what"  Patient Stated Goal: to rehab then home   Prior Functioning  Home Living Lives With: Spouse Available Help at Discharge: Available PRN/intermittently;Family Type of Home: House (Townhouse) Home Access: Level entry Home Layout: Two level;Bed/bath upstairs Alternate Level Stairs-Number of Steps: 14 Alternate Level Stairs-Rails: Right Bathroom  Shower/Tub:  Associate Professor: Yes How Accessible: Accessible via walker Prior Function Level of Independence: Independent Able to Take Stairs?: Yes Driving: Yes Vocation: On disability Communication Communication: No difficulties    Cognition  Cognition Arousal/Alertness: Awake/alert (Drowsy but easy to arouse ) Behavior During Therapy: WFL for tasks assessed/performed Overall Cognitive Status: Within Functional Limits for tasks assessed    Extremity/Trunk Assessment Right Lower Extremity Assessment RLE ROM/Strength/Tone: Deficits;Unable to fully assess;Due to pain RLE ROM/Strength/Tone Deficits: DF/PF WFL; unable to assess due to pain; unable to perform SLR and LAQ RLE Sensation: WFL - Light Touch Left Lower Extremity Assessment LLE ROM/Strength/Tone: WFL for tasks assessed LLE Sensation: WFL - Light Touch Trunk Assessment Trunk Assessment: Normal   Balance Balance Balance Assessed: Yes Static Sitting Balance Static Sitting - Balance Support: No upper extremity supported;Feet unsupported Static Sitting - Level of Assistance: 5: Stand by assistance Static Sitting - Comment/# of Minutes: tolerated sitting EOB ~7 min; became nauseous and began dry heaving; c/o dizziness and "loopy" feeling  End of Session PT - End of Session Equipment Utilized During Treatment: Right knee immobilizer Activity Tolerance: Patient limited by fatigue;Other (comment);Treatment limited secondary to medication (c/o nausea and dizziniess with activity ) Patient left: in bed;with call bell/phone within reach;with bed alarm set;with family/visitor present Nurse Communication: Mobility status CPM Right Knee CPM Right Knee: Off Right Knee Flexion (Degrees): 60 Right Knee Extension (Degrees): 0  GP     Michael Rose, [place] [phone] 10/21/2012, 3:32 PM

## 2012-10-21 NOTE — H&P (View-Only) (Signed)
TOTAL KNEE ADMISSION H&P  Patient is being admitted for right total knee arthroplasty.  Subjective:  Chief Complaint:right knee pain.  HPI: Michael Rose, 62 y.o. male, has a history of pain and functional disability in the right knee due to arthritis and has failed non-surgical conservative treatments for greater than 12 weeks to includeNSAID's and/or analgesics, corticosteriod injections, use of assistive devices and activity modification.  Onset of symptoms was gradual, starting 5 years ago with gradually worsening course since that time. The patient noted prior procedures on the knee to include  arthroscopy and menisectomy on the right knee(s).  Patient currently rates pain in the right knee(s) at 8 out of 10 with activity. Patient has night pain, worsening of pain with activity and weight bearing, pain that interferes with activities of daily living, pain with passive range of motion, crepitus and joint swelling.  Patient has evidence of periarticular osteophytes and joint space narrowing by imaging studies.There is no active infection.  Patient Active Problem List   Diagnosis Date Noted  . Mixed hyperlipidemia 07/08/2010  . MORBID OBESITY 07/08/2010  . Essential hypertension, benign 07/08/2010  . CORONARY ATHEROSCLEROSIS NATIVE CORONARY ARTERY 07/08/2010   Past Medical History  Diagnosis Date  . Hyperlipidemia   . Hypertension   . Osteoarthritis   . Myocardial infarction     IMI 2008  . CAD (coronary artery disease)     BMS to RCA 2008, [facility], Dr. Smith  . Allergic reaction to contrast dye   . Bipolar affective disorder     on meds  . History of nuclear stress test 08/2012  . GERD (gastroesophageal reflux disease)   . Ringing in right ear   . Hard of hearing     right ear    Past Surgical History  Procedure Laterality Date  . Vasectomy    . Total knee arthroplasty      Left  . Joint replacement    . Coronary angioplasty Right 2008    Dr Henry Smith  .  Cholecystectomy  2013     (Not in a hospital admission) Allergies  Allergen Reactions  . Iodinated Diagnostic Agents Hives and Itching  . Penicillins Other (See Comments)    chillhood allergy     History  Substance Use Topics  . Smoking status: Never Smoker   . Smokeless tobacco: Never Used  . Alcohol Use: No    Family History  Problem Relation Age of Onset  . Heart disease Mother   . Heart disease Father      Review of Systems  Constitutional: Negative.   HENT: Positive for hearing loss and tinnitus. Negative for ear pain, nosebleeds, sore throat and ear discharge.   Eyes: Negative.   Respiratory: Negative.   Cardiovascular: Positive for chest pain. Negative for palpitations, orthopnea, claudication and leg swelling.  Gastrointestinal: Negative.   Genitourinary: Negative.   Musculoskeletal: Positive for back pain and joint pain. Negative for falls.  Skin: Negative.   Neurological: Negative.  Negative for headaches.  Endo/Heme/Allergies: Negative.   Psychiatric/Behavioral: Negative for suicidal ideas and substance abuse.    Objective:  Physical Exam  Constitutional: He is oriented to person, place, and time. He appears well-developed and well-nourished. No distress.  HENT:  Head: Normocephalic and atraumatic.  Nose: Nose normal.  Eyes: Conjunctivae and EOM are normal. Pupils are equal, round, and reactive to light.  Neck: Normal range of motion. Neck supple.  Cardiovascular: Normal rate, regular rhythm, normal heart sounds and intact distal pulses.     Respiratory: Effort normal and breath sounds normal. No respiratory distress. He has no wheezes.  GI: Soft. Bowel sounds are normal. He exhibits no distension. There is no tenderness.  Musculoskeletal:       Right knee: He exhibits decreased range of motion and swelling. Tenderness found. Medial joint line and lateral joint line tenderness noted.  Lymphadenopathy:    He has no cervical adenopathy.  Neurological: He is  alert and oriented to person, place, and time. No cranial nerve deficit.  Skin: Skin is warm and dry. No rash noted. No erythema.  Psychiatric: He has a normal mood and affect. His behavior is normal.    Vital signs in last 24 hours: @VSRANGES@  Labs:   Estimated body mass index is 35.66 kg/(m^2) as calculated from the following:   Height as of 09/15/12: 5' 11" (1.803 m).   Weight as of an earlier encounter on 10/13/12: 115.939 kg (255 lb 9.6 oz).   Imaging Review Plain radiographs demonstrate severe degenerative joint disease of the right knee(s). The overall alignment ismild varus. The bone quality appears to be good for age and reported activity level.  Assessment/Plan:  End stage arthritis, right knee   The patient history, physical examination, clinical judgment of the provider and imaging studies are consistent with end stage degenerative joint disease of the right knee(s) and total knee arthroplasty is deemed medically necessary. The treatment options including medical management, injection therapy arthroscopy and arthroplasty were discussed at length. The risks and benefits of total knee arthroplasty were presented and reviewed. The risks due to aseptic loosening, infection, stiffness, patella tracking problems, thromboembolic complications and other imponderables were discussed. The patient acknowledged the explanation, agreed to proceed with the plan and consent was signed. Patient is being admitted for inpatient treatment for surgery, pain control, PT, OT, prophylactic antibiotics, VTE prophylaxis, progressive ambulation and ADL's and discharge planning. The patient is planning to be discharged to skilled nursing facility [facility].     

## 2012-10-21 NOTE — Transfer of Care (Signed)
Immediate Anesthesia Transfer of Care Note  Patient: Michael Rose  Procedure(s) Performed: Procedure(s): TOTAL KNEE ARTHROPLASTY (Right)  Patient Location: PACU  Anesthesia Type:GA combined with regional for post-op pain  Level of Consciousness: awake, alert  and oriented  Airway & Oxygen Therapy: Patient Spontanous Breathing and Patient connected to nasal cannula oxygen  Post-op Assessment: Report given to PACU RN, Post -op Vital signs reviewed and stable and Patient moving all extremities  Post vital signs: Reviewed and stable  Complications: No apparent anesthesia complications

## 2012-10-22 LAB — BASIC METABOLIC PANEL
BUN: 11 mg/dL (ref 6–23)
Creatinine, Ser: 1.11 mg/dL (ref 0.50–1.35)
GFR calc Af Amer: 80 mL/min — ABNORMAL LOW (ref >90)
GFR calc non Af Amer: 69 mL/min — ABNORMAL LOW (ref >90)
Potassium: 4.4 mEq/L (ref 3.5–5.1)

## 2012-10-22 LAB — CBC
HCT: 36.6 % — ABNORMAL LOW (ref 39.0–52.0)
MCHC: 33.1 g/dL (ref 30.0–36.0)
Platelets: 155 10*3/uL (ref 150–400)
RDW: 14.2 % (ref 11.5–15.5)

## 2012-10-22 NOTE — Progress Notes (Signed)
Subjective: 1 Day Post-Op Procedure(s) (LRB): TOTAL KNEE ARTHROPLASTY (Right) Patient reports pain as 7 on 0-10 scale and well contolled ith pain meds.    Objective: Vital signs in last 24 hours: Temp:  [97.1 F (36.2 C)-98.6 F (37 C)] 97.1 F (36.2 C) (05/24 0655) Pulse Rate:  [59-76] 70 (05/24 0655) Resp:  [9-18] 18 (05/24 0655) BP: (118-143)/(55-89) 130/70 mmHg (05/24 0655) SpO2:  [93 %-100 %] 100 % (05/24 0655) Weight:  [115.667 kg (255 lb)] 115.667 kg (255 lb) (05/23 1245)  Intake/Output from previous day: 05/23 0701 - 05/24 0700 In: 2537.5 [P.O.:400; I.V.:2137.5] Out: 1825 [Urine:1050; Drains:700; Blood:75] Intake/Output this shift: Total I/O In: 480 [P.O.:480] Out: 1305 [Urine:1130; Drains:175]   Recent Labs  10/21/12 1420 10/22/12 0425  HGB 13.4 12.1*    Recent Labs  10/21/12 1420 10/22/12 0425  WBC 12.9* 9.5  RBC 5.01 4.62  HCT 40.1 36.6*  PLT 157 155    Recent Labs  10/21/12 1420 10/22/12 0425  NA  --  138  K  --  4.4  CL  --  102  CO2  --  27  BUN  --  11  CREATININE 1.22 1.11  GLUCOSE  --  130*  CALCIUM  --  8.5   No results found for this basename: LABPT, INR,  in the last 72 hours  ABD soft Neurovascular intact Dorsiflexion/Plantar flexion intact Compartment soft dressing dry  Assessment/Plan: 1 Day Post-Op Procedure(s) (LRB): TOTAL KNEE ARTHROPLASTY (Right) Up with therapy Discharge to SNF on Monday 10/24/12 if available  Sama Arauz B 10/22/2012, 10:23 AM

## 2012-10-22 NOTE — Progress Notes (Signed)
Clinical Social Work Department BRIEF PSYCHOSOCIAL ASSESSMENT 10/22/2012  Patient:  Michael Rose, Michael Rose     Account Number:  [id number]     Admit date:  10/21/2012  Clinical Social Worker:  Candie Chroman  Date/Time:  10/22/2012 11:02 AM  Referred by:  Physician  Date Referred:  10/21/2012 Referred for  SNF Placement   Other Referral:   Interview type:  Patient Other interview type:    PSYCHOSOCIAL DATA Living Status:  WIFE Admitted from facility:   Level of care:   Primary support name:  [facility] Primary support relationship to patient:  SPOUSE Degree of support available:   unclear    CURRENT CONCERNS Current Concerns  Post-Acute Placement   Other Concerns:    SOCIAL WORK ASSESSMENT / PLAN Pt is a 62 yr old gentleman living at home prior to hospitalization. CSW met with pt to assist with d/c planning. Pt would like to have ST Rehab at [facility] following hospital d/c. SNF search initiated and [facility] contacted . Message left for admissions to contact csw on Monday. CSW will continue to follow to assist with d/c planning to SNF.   Assessment/plan status:  Psychosocial Support/Ongoing Assessment of Needs Other assessment/ plan:   Information/referral to community resources:   SNF list provided.    PATIENT'S/FAMILY'S RESPONSE TO PLAN OF CARE: Pt feels ST Rehab is needed and would like to go to [place].    Cori Razor LCSW [phone]

## 2012-10-22 NOTE — Progress Notes (Signed)
Physical Therapy Treatment Patient Details Name: Michael Rose MRN: [id number] DOB: 1950-11-13 Today's Date: 10/22/2012 Time: 4098-1191 PT Time Calculation (min): 48 min  PT Assessment / Plan / Recommendation Comments on Treatment Session  Pt. progressing in mobility though is still limited greatly by pain, reporting his pain level at "20".  Needs ongoing PT at SNF once DC'd from acute.  P.t and wife had extensive questions regarding expected progress and course of therapy.  I answered all their questions.    Follow Up Recommendations  SNF     Does the patient have the potential to tolerate intense rehabilitation     Barriers to Discharge        Equipment Recommendations  None recommended by PT    Recommendations for Other Services    Frequency 7X/week   Plan Discharge plan remains appropriate;Frequency remains appropriate    Precautions / Restrictions Precautions Precautions: Fall;Knee Required Braces or Orthoses: Knee Immobilizer - Right Knee Immobilizer - Right: On when out of bed or walking Restrictions Weight Bearing Restrictions: Yes RLE Weight Bearing: Weight bearing as tolerated   Pertinent Vitals/Pain See vitals tab      Mobility  Bed Mobility Bed Mobility: Sit to Supine (pt. in recliner chair) Sit to Supine: 1: +2 Total assist Sit to Supine: Patient Percentage: 60% Details for Bed Mobility Assistance: assist primarily to control descent and to manage R LE Transfers Transfers: Sit to Stand;Stand to Sit Sit to Stand: 1: +2 Total assist;With upper extremity assist;With armrests;From chair/3-in-1 Sit to Stand: Patient Percentage: 70% Stand to Sit: 1: +2 Total assist;To chair/3-in-1;With upper extremity assist;With armrests Stand to Sit: Patient Percentage: 70% Details for Transfer Assistance: cues for hand and foot placement Ambulation/Gait Ambulation/Gait Assistance: 1: +2 Total assist Ambulation/Gait: Patient Percentage: 70% Ambulation Distance (Feet): 40  Feet Assistive device: Rolling walker Ambulation/Gait Assistance Details: pt. needed cues for RW distance and step length as well as sequencing.  Ambulated mostly as min assist level  with second person for recliner and IV.   Gait Pattern: Step-through pattern;Decreased step length - right;Decreased step length - left;Decreased stance time - left;Decreased stride length Gait velocity: decreased (needs increased time for short distances)    Exercises Total Joint Exercises Ankle Circles/Pumps: AROM;Both;10 reps;Supine Quad Sets: AROM;Right;15 reps Short Arc QuadBarbaraann Boys;Right;15 reps Knee Flexion: AAROM;10 reps;Seated Goniometric ROM: 0-65   PT Diagnosis:    PT Problem List:   PT Treatment Interventions:     PT Goals Acute Rehab PT Goals Pt will go Sit to Supine/Side: with modified independence PT Goal: Sit to Supine/Side - Progress: Progressing toward goal Pt will go Sit to Stand: with modified independence PT Goal: Sit to Stand - Progress: Progressing toward goal Pt will go Stand to Sit: with modified independence PT Goal: Stand to Sit - Progress: Progressing toward goal Pt will Ambulate: >150 feet;with modified independence;with rolling walker PT Goal: Ambulate - Progress: Progressing toward goal  Visit Information  Last PT Received On: 10/22/12 Assistance Needed: +2    Subjective Data  Subjective: Pt. reports pain is "20"   Cognition  Cognition Arousal/Alertness: Awake/alert Behavior During Therapy: WFL for tasks assessed/performed Overall Cognitive Status: Within Functional Limits for tasks assessed    Balance     End of Session PT - End of Session Equipment Utilized During Treatment: Right knee immobilizer Activity Tolerance: Patient limited by fatigue;Patient limited by pain;Other (comment) (no dizziness today) Patient left: in bed;in CPM;with call bell/phone within reach;with family/visitor present Nurse Communication: Mobility status  GP     Ferman Hamming 10/22/2012, 3:17 PM Weldon Picking PT Acute Rehab Services [phone] Bee[place] [phone]

## 2012-10-22 NOTE — Progress Notes (Signed)
Orthopedic Tech Progress Note Patient Details:  Michael Rose [birth year] [id number]  Patient ID: [name], male   DOB: 1950/06/08, 62 y.o.   MRN: [id number] Trapeze bar patient helper  [name] 10/22/2012, 6:56 PM

## 2012-10-22 NOTE — Progress Notes (Signed)
Contacted Teresa RN at [phone] in reference to IV restart for Mr. Laforge.

## 2012-10-22 NOTE — Progress Notes (Signed)
Orthopedic Tech Progress Note Patient Details:  Michael Rose 02-11-1951 [id number]  Patient ID: [name], male   DOB: Oct 01, 1950, 62 y.o.   MRN: [id number] Viewed order from doctor's order list   [name] 10/22/2012, 6:56 PM

## 2012-10-22 NOTE — Progress Notes (Addendum)
Clinical Social Work Department CLINICAL SOCIAL WORK PLACEMENT NOTE 10/22/2012  Patient:  SLATER, MCMANAMAN  Account Number:  [id number] Admit date:  10/21/2012  Clinical Social Worker:  Cori Razor, LCSW  Date/time:  10/22/2012 11:31 AM  Clinical Social Work is seeking post-discharge placement for this patient at the following level of care:   SKILLED NURSING   (*CSW will update this form in Epic as items are completed)   10/22/2012  Patient/family provided with [name] Department of Clinical Social Work's list of facilities offering this level of care within the geographic area requested by the patient (or if unable, by the patient's family).  10/22/2012  Patient/family informed of their freedom to choose among providers that offer the needed level of care, that participate in Medicare, Medicaid or managed care program needed by the patient, have an available bed and are willing to accept the patient.  10/22/2012  Patient/family informed of [facility]' ownership interest in [facility], as well as of the fact that they are under no obligation to receive care at this facility.  PASARR submitted to EDS on  PASARR number received from EDS on 10/24/2012- [phone] [id number]   FL2 transmitted to all facilities in geographic area requested by pt/family on  10/22/2012 FL2 transmitted to all facilities within larger geographic area on   Patient informed that his/her managed care company has contracts with or will negotiate with  certain facilities, including the following:     Patient/family informed of bed offers received:  10/25/12 Dorma Russell) Patient chooses bed at [facility]) Physician recommends and patient chooses bed at  SNF (JB)  Patient to be transferred to  on  10/25/12 (JB) Patient to be transferred to facility by Darnell Level)  The following physician request were entered in Epic:   Additional Comments:  Cori Razor LCSW [phone]

## 2012-10-22 NOTE — Op Note (Signed)
NAME:  Michael Rose, Michael Rose NO.:  [id number]  MEDICAL RECORD NO.:  [id number]  LOCATION:  [facility]                         FACILITY:  [facility]  PHYSICIAN:  Dyke Brackett, M.D.    DATE OF BIRTH:  1950/08/20  DATE OF PROCEDURE:  10/21/2012 DATE OF DISCHARGE:                              OPERATIVE REPORT   INDICATIONS:  This is a 62 year old male with severe varus deformity of knee, osteoarthritis of right knee, thought to be amenable to hospitalization for total knee replacement.  POSTOPERATIVE DIAGNOSIS:  Osteoarthritis, right knee.  OPERATION:  Right total knee replacement (Sigma size 5 femur, tibia 12.5 mm __________ all poly patella).  SURGEON:  Dyke Brackett, MD.  ASSISTANT:  Margart Sickles, PA-C.  BLOOD LOSS:  Minimal.  TOURNIQUET TIME:  1 hour 3 minutes.  DESCRIPTION OF PROCEDURE:  Sterile prep and drape, exsanguination of leg, placed tourniquet 350, straight skin incision medial parapatellar approach to the knee made, cut 11 mm off the distal femur in line with the flexion contracture and also cut about 2 mm below the most diseased medial compartment and actually even with this minimal cut, we cut slightly below the level of the fibula, extension gap was measured at 12.5 mm.  We then prepared the distal femur with 2 pins off the stem to set the external rotation for the anterior, posterior, and chamfer cuts. Guide to 12.62mm.  These cuts were made.  Attention was next directed at the tibia.  Keel hole was cut for the tibia, size 5 tibia was deemed to be appropriate for the patient.  The trial was placed on the tibia followed by box cut on the femur for the femoral trial, femoral trial was placed.  Obtained nearly full extension, good balancing medially and laterally was noted, patella was cut leaving about 17 mm of native patella and all poly patella with trial placed.  Again, good correction of the varus deformity was noted.  The closure was affected after  the final components were inserted, cement in a doughy state.  Prior to this, the trial bearing was removed.  The tourniquet was released prior to the placement of the final bearing, 2.5 mm bearing was needed to be acceptable.  Copious irrigation was carried out.  Hemovac drain was placed exiting superolaterally and the closure was affected with #1 Ethibond 2-0 Vicryl, and skin clips.  Lightly pressure sterile dress applied.  Taken to recovery room in stable condition.    Dyke Brackett, M.D.    WDC/MEDQ  D:  10/21/2012  T:  10/21/2012  Job:  [id number]

## 2012-10-22 NOTE — Progress Notes (Signed)
OT Cancellation Note  Patient Details Name: Michael Rose MRN: [id number] DOB: 06-22-1950   Cancelled Treatment:    Reason Eval/Treat Not Completed:  Pt to d/c to SNF.  Will defer OT to SNF.  Evern Bio 10/22/2012, 3:29 PM

## 2012-10-23 LAB — CBC
Hemoglobin: 10.4 g/dL — ABNORMAL LOW (ref 13.0–17.0)
MCHC: 33.3 g/dL (ref 30.0–36.0)
RDW: 14.1 % (ref 11.5–15.5)
WBC: 10.2 10*3/uL (ref 4.0–10.5)

## 2012-10-23 NOTE — Progress Notes (Signed)
Physical Therapy Treatment Patient Details Name: Michael Rose MRN: [id number] DOB: 12/26/1950 Today's Date: 10/23/2012 Time: 4098-1191 PT Time Calculation (min): 27 min  PT Assessment / Plan / Recommendation Comments on Treatment Session  pt slowly progressing. cont to be limited by dizziness and pain. pt cont to need PT to maximize functional mobility. Cont to recommend SNF upon acute D/C.     Follow Up Recommendations  SNF     Does the patient have the potential to tolerate intense rehabilitation     Barriers to Discharge        Equipment Recommendations  None recommended by PT    Recommendations for Other Services    Frequency 7X/week   Plan Discharge plan remains appropriate;Frequency remains appropriate    Precautions / Restrictions Precautions Precautions: Fall;Knee Required Braces or Orthoses: Knee Immobilizer - Right Knee Immobilizer - Right: On when out of bed or walking Restrictions Weight Bearing Restrictions: Yes RLE Weight Bearing: Weight bearing as tolerated   Pertinent Vitals/Pain 7/10; primarily c/o dizziness and "watery eyes" pt premedicate and RN present     Mobility  Bed Mobility Bed Mobility: Supine to Sit Supine to Sit: 4: Min assist;HOB elevated Details for Bed Mobility Assistance: (A) to advance R LE off EOB and control LE to ground Transfers Transfers: Sit to Stand;Stand to Sit Sit to Stand: 1: +2 Total assist;From elevated surface;From bed Sit to Stand: Patient Percentage: 70% Stand to Sit: To chair/3-in-1;With upper extremity assist;3: Mod assist;Other (comment) (2nd person for safety recommended) Details for Transfer Assistance: verbal cues for safety and hand placement with RW, tends to flop into chair for sitting due to pain; pt encouraged to control descent to chair with bil UEs Ambulation/Gait Ambulation/Gait Assistance: 3: Mod assist;Other (comment) (2nd person for safety and to follow with chair ) Ambulation Distance (Feet): 20  Feet Assistive device: Rolling walker Ambulation/Gait Assistance Details: vc's for gt sequencing and requires assistance at times to facilate movement of R LE due to pain; 2nd person is helpful for safety, pt c/o dizziniess with increased amb requires sitting immediately for safety Gait Pattern: Step-through pattern;Decreased step length - right;Decreased step length - left;Decreased stance time - left;Decreased stride length Gait velocity: decreased due to pain Stairs: No Wheelchair Mobility Wheelchair Mobility: No    Exercises Total Joint Exercises Ankle Circles/Pumps: AROM;Both;10 reps;Seated Quad Sets: AROM;Right;15 reps Heel Slides: AAROM;Right;10 reps;Seated Hip ABduction/ADduction: AAROM;Right;10 reps;Seated Knee Flexion: AAROM;10 reps;Seated Goniometric ROM: 2 to 60; AROM; in seated position lmitied by pain   PT Diagnosis:    PT Problem List:   PT Treatment Interventions:     PT Goals Acute Rehab PT Goals PT Goal Formulation: With patient Time For Goal Achievement: 10/28/12 Potential to Achieve Goals: Good PT Goal: Supine/Side to Sit - Progress: Progressing toward goal PT Goal: Sit to Stand - Progress: Progressing toward goal PT Goal: Stand to Sit - Progress: Progressing toward goal PT Goal: Ambulate - Progress: Progressing toward goal  Visit Information  Last PT Received On: 10/23/12 Assistance Needed: +2    Subjective Data  Subjective: Pt sleeping supine; agreeable to therapy; c/o eyes watering due to allergies; wishes to go to the bathroom Patient Stated Goal: to rehab then home   Cognition  Cognition Arousal/Alertness: Awake/alert Behavior During Therapy: WFL for tasks assessed/performed Overall Cognitive Status: Within Functional Limits for tasks assessed    Balance  Balance Balance Assessed: Yes Static Standing Balance Static Standing - Balance Support: Right upper extremity supported;During functional activity Static Standing - Comment/#  of Minutes: pt  stood for ~5 min to use bathroom; required min guard to maintain balance and single UE support; pt c/o dizziniess after bathroom and was unable to icnrease amb   End of Session PT - End of Session Equipment Utilized During Treatment: Right knee immobilizer Activity Tolerance: Patient limited by pain;Other (comment) (c/o dizziniess today) Patient left: in chair;with call bell/phone within reach;with nursing in room Nurse Communication: Mobility status CPM Right Knee CPM Right Knee: [address]   GP     [name] , [place] [phone]  10/23/2012, 10:42 AM

## 2012-10-23 NOTE — Progress Notes (Signed)
Subjective: 2 Days Post-Op Procedure(s) (LRB): TOTAL KNEE ARTHROPLASTY (Right) Patient reports pain as 4 on 0-10 scale. Voiding without difficulty     Objective: Vital signs in last 24 hours: Temp:  [99 F (37.2 C)-100 F (37.8 C)] 99.3 F (37.4 C) (05/25 0611) Pulse Rate:  [91-99] 98 (05/25 0611) Resp:  [15-20] 20 (05/25 0800) BP: (121-136)/(61-72) 136/61 mmHg (05/25 0611) SpO2:  [93 %-100 %] 93 % (05/25 0611)  Intake/Output from previous day: 05/24 0701 - 05/25 0700 In: 840 [P.O.:840] Out: 1530 [Urine:1355; Drains:175] Intake/Output this shift: Total I/O In: 600 [P.O.:600] Out: -    Recent Labs  10/21/12 1420 10/22/12 0425 10/23/12 0552  HGB 13.4 12.1* 10.4*    Recent Labs  10/22/12 0425 10/23/12 0552  WBC 9.5 10.2  RBC 4.62 3.94*  HCT 36.6* 31.2*  PLT 155 128*    Recent Labs  10/21/12 1420 10/22/12 0425  NA  --  138  K  --  4.4  CL  --  102  CO2  --  27  BUN  --  11  CREATININE 1.22 1.11  GLUCOSE  --  130*  CALCIUM  --  8.5   No results found for this basename: LABPT, INR,  in the last 72 hours  Neurovascular intact Dorsiflexion/Plantar flexion intact Compartment soft  Assessment/Plan: 2 Days Post-Op Procedure(s) (LRB): TOTAL KNEE ARTHROPLASTY (Right) Advance diet Up with therapy Discharge to SNF tomorrow if bed available  ROBERTS,JANE B 10/23/2012, 11:31 AM

## 2012-10-24 LAB — CBC
Platelets: 130 10*3/uL — ABNORMAL LOW (ref 150–400)
RBC: 3.97 MIL/uL — ABNORMAL LOW (ref 4.22–5.81)
WBC: 10.1 10*3/uL (ref 4.0–10.5)

## 2012-10-24 NOTE — Progress Notes (Signed)
Physical Therapy Treatment Patient Details Name: Michael Rose MRN: [id number] DOB: [birth year] Today's Date: 10/24/2012 Time: 9629-5284 PT Time Calculation (min): 38 min  PT Assessment / Plan / Recommendation Comments on Treatment Session  Pt progressing slowly with therapy. Can become very frustrated with himself. Requires max encouragement. Pt will benefit from ST-SNF upon d/c to increase indepdence and reduce burden of care on significant other. Pt and significant other were very upset and worried about D/C plan. Discussed with pt transfer techniques. If pt chooses to transport to SNF with wife; they will need assistance for car transfer. Social worker notified.     Follow Up Recommendations  SNF     Does the patient have the potential to tolerate intense rehabilitation     Barriers to Discharge        Equipment Recommendations  None recommended by PT    Recommendations for Other Services    Frequency 7X/week   Plan Discharge plan remains appropriate;Frequency remains appropriate    Precautions / Restrictions Precautions Precautions: Fall;Knee Required Braces or Orthoses: Knee Immobilizer - Right Knee Immobilizer - Right: On when out of bed or walking Restrictions Weight Bearing Restrictions: Yes RLE Weight Bearing: Weight bearing as tolerated   Pertinent Vitals/Pain 2/10 at rest; 8/10 with activity; pt repositioned in bed.     Mobility  Bed Mobility Bed Mobility: Sit to Supine Sit to Supine: 4: Min assist Details for Bed Mobility Assistance: (A) to advance R LE onto bed Transfers Transfers: Sit to Stand;Stand to Sit Sit to Stand: From chair/3-in-1;3: Mod assist Stand to Sit: 4: Min assist;To bed;To elevated surface;With upper extremity assist Details for Transfer Assistance: required assistance to shift weight fwd for sit to stand and steady due to pain weight WB on R LE;  requires cues for hand placement and safety with transfers Ambulation/Gait Ambulation/Gait  Assistance: 4: Min guard Ambulation Distance (Feet): 60 Feet Assistive device: Rolling walker Ambulation/Gait Assistance Details: verbal cues for gt sequencing; demo increased ability to WB on R LE; required 2 standing rest breaks due to fatigue in arms Gait Pattern: Step-through pattern;Decreased step length - left;Decreased stance time - right Gait velocity: decreased due to pain Stairs: No Wheelchair Mobility Wheelchair Mobility: No    Exercises Total Joint Exercises Ankle Circles/Pumps: AROM;Both;10 reps;Seated Quad Sets: AROM;Right;10 reps;Seated Straight Leg Raises: AAROM;Right;5 reps;Supine Long Arc Quad: AAROM;Right;10 reps;Seated Knee Flexion: AAROM;10 reps;Seated Goniometric ROM: 2 to 80 degrees; AROM in sitting   PT Diagnosis:    PT Problem List:   PT Treatment Interventions:     PT Goals Acute Rehab PT Goals PT Goal Formulation: With patient Time For Goal Achievement: 10/28/12 Potential to Achieve Goals: Good PT Goal: Sit to Supine/Side - Progress: Progressing toward goal PT Goal: Sit to Stand - Progress: Progressing toward goal PT Goal: Stand to Sit - Progress: Progressing toward goal PT Goal: Ambulate - Progress: Progressing toward goal  Visit Information  Last PT Received On: 10/24/12 Assistance Needed: +1    Subjective Data  Subjective: pt sitting in chair with wife present; both very upset with lack of communication and stiffness in R LE  Patient Stated Goal: rehab    Cognition  Cognition Arousal/Alertness: Awake/alert Behavior During Therapy: WFL for tasks assessed/performed Overall Cognitive Status: Within Functional Limits for tasks assessed    Balance  Balance Balance Assessed: No  End of Session PT - End of Session Equipment Utilized During Treatment: Right knee immobilizer Activity Tolerance: Patient tolerated treatment well Patient left: in bed;with call  bell/phone within reach;in CPM;Other (comment) (CPM set -2 to 70degrees; tolerable  ) Nurse Communication: Mobility status   GP     Donell Sievert, [place] [phone] 10/24/2012, 2:28 PM

## 2012-10-24 NOTE — Progress Notes (Signed)
Subjective: 3 Days Post-Op Procedure(s) (LRB): TOTAL KNEE ARTHROPLASTY (Right) Patient reports pain as 5 on 0-10 scale. No sob or cp  Objective: Vital signs in last 24 hours: Temp:  [99.1 F (37.3 C)-102.7 F (39.3 C)] 99.1 F (37.3 C) (05/26 0538) Pulse Rate:  [95-103] 95 (05/26 0538) Resp:  [18-20] 18 (05/26 0800) BP: (133-142)/(70-82) 142/82 mmHg (05/26 0538) SpO2:  [94 %-98 %] 94 % (05/26 0538)  Intake/Output from previous day: 05/25 0701 - 05/26 0700 In: 1560 [P.O.:1560] Out: 600 [Urine:600] Intake/Output this shift:     Recent Labs  10/21/12 1420 10/22/12 0425 10/23/12 0552 10/24/12 0455  HGB 13.4 12.1* 10.4* 10.5*    Recent Labs  10/23/12 0552 10/24/12 0455  WBC 10.2 10.1  RBC 3.94* 3.97*  HCT 31.2* 31.5*  PLT 128* 130*    Recent Labs  10/21/12 1420 10/22/12 0425  NA  --  138  K  --  4.4  CL  --  102  CO2  --  27  BUN  --  11  CREATININE 1.22 1.11  GLUCOSE  --  130*  CALCIUM  --  8.5   No results found for this basename: LABPT, INR,  in the last 72 hours  Neurovascular intact Incision: dressing C/D/I and scant drainage Small reddened area of skin inner right thigh non tender, along area of ace wrap Calf swollen but non tender, distal pulses strong, no pain to compression Assessment/Plan: 3 Days Post-Op Procedure(s) (LRB): TOTAL KNEE ARTHROPLASTY (Right) Up with therapy Plan for discharge tomorrow, if bed available Monitor red area on inner thigh Encourage incentive spirometer  ROBERTS,JANE B 10/24/2012, 8:46 AM

## 2012-10-25 ENCOUNTER — Encounter (HOSPITAL_COMMUNITY): Payer: Self-pay | Admitting: Orthopedic Surgery

## 2012-10-25 MED ORDER — LORATADINE 10 MG PO TABS
10.0000 mg | ORAL_TABLET | Freq: Every day | ORAL | Status: DC
  Administered 2012-10-25: 10 mg via ORAL
  Filled 2012-10-25: qty 1

## 2012-10-25 MED ORDER — DSS 100 MG PO CAPS: 100.0000 mg | ORAL_CAPSULE | Freq: Two times a day (BID) | ORAL | Status: DC

## 2012-10-25 MED ORDER — ACETAMINOPHEN 325 MG PO TABS: 650.0000 mg | ORAL_TABLET | Freq: Four times a day (QID) | ORAL | Status: DC | PRN

## 2012-10-25 NOTE — Clinical Social Work Note (Signed)
Pt for transfer to [facility] today via [facility]. Pt, pt's wife, and SNF aware of d/c. D/C packet complete with chart copy and signed FL2. CSW signing off as no other CSW needs identified at this time.  Dellie Burns, MSW, [place] [phone] (coverage)

## 2012-10-25 NOTE — Discharge Summary (Signed)
PATIENT ID: Michael Rose        MRN:  [id number]          DOB/AGE: 1950/10/30 / 62 y.o.    DISCHARGE SUMMARY  ADMISSION DATE:    10/21/2012 DISCHARGE DATE:   10/25/2012   ADMISSION DIAGNOSIS: OA R knee    DISCHARGE DIAGNOSIS:  OA R knee    ADDITIONAL DIAGNOSIS: Active Problems:   * No active hospital problems. *  Past Medical History  Diagnosis Date  . Hyperlipidemia   . Hypertension   . Osteoarthritis   . Myocardial infarction     IMI 2008  . CAD (coronary artery disease)     BMS to RCA 2008, [facility] cardiology, Dr. Katrinka Blazing  . Allergic reaction to contrast dye   . Bipolar affective disorder     on meds  . History of nuclear stress test 08/2012  . GERD (gastroesophageal reflux disease)   . Ringing in right ear   . Hard of hearing     right ear  . Complication of anesthesia     difficult waking  . Anginal pain     PROCEDURE: Procedure(s): TOTAL KNEE ARTHROPLASTY Righton 10/21/2012  CONSULTS:PT/OT  HISTORY:  See H&P in chart  HOSPITAL COURSE:  Michael Rose is a 62 y.o. admitted on 10/21/2012 and found to have a diagnosis of OA R knee.  After appropriate laboratory studies were obtained  they were taken to the operating room on 10/21/2012 and underwent  Procedure(s): TOTAL KNEE ARTHROPLASTY Right.   They were given perioperative antibiotics:  Anti-infectives   Start     Dose/Rate Route Frequency Ordered Stop   10/21/12 1400  clindamycin (CLEOCIN) IVPB 600 mg     600 mg 100 mL/hr over 30 Minutes Intravenous Every 6 hours 10/21/12 1257 10/21/12 2014   10/21/12 0600  clindamycin (CLEOCIN) IVPB 900 mg     900 mg 100 mL/hr over 30 Minutes Intravenous On call to O.R. 10/20/12 1408 10/21/12 0734    .  Tolerated the procedure well.  Placed with a foley intraoperatively.  Given Ofirmev at induction   POD #1, allowed out of bed to a chair.  PT for ambulation and exercise program.  Foley D/C'd in morning.  IV saline locked.  O2 discontionued.  POD #2, continued PT and  ambulation.  Hemovac pulled.  POD#3-4 awaiting SNF bed, continued PT  The remainder of the hospital course was dedicated to ambulation and strengthening.   The patient was discharged on 4 Days Post-Op in  Stable condition.  Blood products given:none  DIAGNOSTIC STUDIES: Recent vital signs: Patient Vitals for the past 24 hrs:  BP Temp Pulse Resp SpO2  10/25/12 0731 - - - 18 -  10/25/12 0558 145/75 mmHg 99.2 F (37.3 C) 101 18 95 %  10/24/12 2107 128/75 mmHg 99.1 F (37.3 C) 97 18 94 %  10/24/12 1600 - - - 18 -  10/24/12 1313 158/63 mmHg 99.5 F (37.5 C) 96 18 98 %  10/24/12 1200 - - - 18 -       Recent laboratory studies:  Recent Labs  10/21/12 1420 10/22/12 0425 10/23/12 0552 10/24/12 0455  WBC 12.9* 9.5 10.2 10.1  HGB 13.4 12.1* 10.4* 10.5*  HCT 40.1 36.6* 31.2* 31.5*  PLT 157 155 128* 130*    Recent Labs  10/21/12 1420 10/22/12 0425  NA  --  138  K  --  4.4  CL  --  102  CO2  --  27  BUN  --  11  CREATININE 1.22 1.11  GLUCOSE  --  130*  CALCIUM  --  8.5   Lab Results  Component Value Date   INR 1.03 10/13/2012   INR 1.0 03/12/2008     Recent Radiographic Studies :  Dg Chest 2 View  10/13/2012   *RADIOLOGY REPORT*  Clinical Data: Preop for right knee osteoarthritis  CHEST - 2 VIEW  Comparison: 09/11/2011  Findings: Cardiomediastinal silhouette is stable.  Degenerative changes thoracic spine again noted.  No acute infiltrate or pulmonary edema.  Mild basilar atelectasis.  IMPRESSION: No active disease.  No significant change.   Original Report Authenticated By: Natasha Mead, M.D.    DISCHARGE INSTRUCTIONS:   DISCHARGE MEDICATIONS:     Medication List    STOP taking these medications       aspirin 81 MG tablet     HYDROcodone-acetaminophen 5-325 MG per tablet  Commonly known as:  NORCO/VICODIN      TAKE these medications       acetaminophen 325 MG tablet  Commonly known as:  TYLENOL  Take 2 tablets (650 mg total) by mouth every 6 (six) hours as  needed.     ALPRAZolam 0.5 MG tablet  Commonly known as:  XANAX  Take 0.25 mg by mouth at bedtime.     atorvastatin 40 MG tablet  Commonly known as:  LIPITOR  Take 40 mg by mouth every evening.     DEXILANT 60 MG capsule  Generic drug:  dexlansoprazole  Take 60 mg by mouth daily with breakfast.     DSS 100 MG Caps  Take 100 mg by mouth 2 (two) times daily.     enoxaparin 30 MG/0.3ML injection  Commonly known as:  LOVENOX  Inject 0.3 mLs (30 mg total) into the skin every 12 (twelve) hours.     fluvoxaMINE 100 MG tablet  Commonly known as:  LUVOX  Take 100 mg by mouth daily with breakfast.     isosorbide mononitrate 30 MG 24 hr tablet  Commonly known as:  IMDUR  TAKE ONE TABLET BY MOUTH IN THE EVENING     isosorbide mononitrate 30 MG 24 hr tablet  Commonly known as:  IMDUR  Take 30 mg by mouth daily with breakfast.     metoprolol succinate 50 MG 24 hr tablet  Commonly known as:  TOPROL-XL  Take 1 tablet (50 mg total) by mouth daily. Take with or immediately following a meal.     mirtazapine 30 MG tablet  Commonly known as:  REMERON  Take 30 mg by mouth at bedtime.     nitroGLYCERIN 0.4 MG SL tablet  Commonly known as:  NITROSTAT  Place 1 tablet (0.4 mg total) under the tongue every 5 (five) minutes as needed for chest pain.     oxyCODONE-acetaminophen 5-325 MG per tablet  Commonly known as:  ROXICET  1-2 tabs po q4-6hrs prn pain     ZYRTEC ALLERGY PO  Take 1 tablet by mouth every other day as needed (allergies.).        FOLLOW UP VISIT:       Follow-up Information   Follow up with CAFFREY JR,W D, MD. Schedule an appointment as soon as possible for a visit in 2 weeks.   Contact information:   [address] [place] [address] [phone]       DISPOSITION:   Skilled Nursing Facility/Rehab  CONDITION:  Stable   Margart Sickles 10/25/2012,  8:22 AM

## 2012-10-25 NOTE — Progress Notes (Signed)
Physical Therapy Treatment Patient Details Name: Michael Rose MRN: [id number] DOB: 1950/07/03 Today's Date: 10/25/2012 Time: 4098-1191 PT Time Calculation (min): 38 min  PT Assessment / Plan / Recommendation Comments on Treatment Session  Pt able to increase ambulation distance however continues to be frustrated with himself on the slow progress and needs encouragement.  Pt continues to agree with SNF at d/c to inrease independence prior to d/c home.    Follow Up Recommendations  SNF     Equipment Recommendations  None recommended by PT    Frequency 7X/week   Plan Discharge plan remains appropriate;Frequency remains appropriate    Precautions / Restrictions Precautions Precautions: Fall;Knee Precaution Booklet Issued: Yes (comment) Precaution Comments: had  L TKA  4 years ago Required Braces or Orthoses: Knee Immobilizer - Right Knee Immobilizer - Right: On when out of bed or walking Restrictions Weight Bearing Restrictions: Yes RLE Weight Bearing: Weight bearing as tolerated   Pertinent Vitals/Pain 5/10 right LE     Mobility  Bed Mobility Bed Mobility: Not assessed (pt stated "I can't lay in that bed.") Transfers Transfers: Sit to Stand;Stand to Sit Sit to Stand: 4: Min assist;With armrests;From chair/3-in-1 Stand to Sit: 4: Min assist;With upper extremity assist;To chair/3-in-1 Details for Transfer Assistance: (A) to initaite transfer and slowly descend to recliner with cues for hand placement.  pt continues to have difficulty weight bearing through right LE Ambulation/Gait Ambulation/Gait Assistance: 4: Min guard;4: Min Environmental consultant (Feet): 80 Feet Assistive device: Rolling walker Ambulation/Gait Assistance Details: (A) to manage RW and prevent walker going too close to pt with ambulation. Cues for step sequence. Gait Pattern: Step-through pattern;Decreased step length - left;Decreased stance time - right Gait velocity: decreased due to pain Stairs:  No Wheelchair Mobility Wheelchair Mobility: No    Exercises Total Joint Exercises Ankle Circles/Pumps: AROM;Both;10 reps;Seated Quad Sets: AROM;Right;10 reps;Seated Short Arc Quad: AAROM;Right;15 reps Heel Slides: AAROM;Right;10 reps;Seated Straight Leg Raises: AAROM;Right;5 reps;Supine   PT Diagnosis:    PT Problem List:   PT Treatment Interventions:     PT Goals Acute Rehab PT Goals PT Goal Formulation: With patient Time For Goal Achievement: 10/28/12 Potential to Achieve Goals: Good Pt will go Sit to Stand: with modified independence PT Goal: Sit to Stand - Progress: Progressing toward goal Pt will go Stand to Sit: with modified independence PT Goal: Stand to Sit - Progress: Progressing toward goal Pt will Ambulate: >150 feet;with modified independence;with rolling walker PT Goal: Ambulate - Progress: Progressing toward goal  Visit Information  Last PT Received On: 10/25/12 Assistance Needed: +1    Subjective Data  Subjective: I just get easily frustrated not being able to lift my leg and move it. Patient Stated Goal: rehab    Cognition  Cognition Arousal/Alertness: Awake/alert Behavior During Therapy: WFL for tasks assessed/performed Overall Cognitive Status: Within Functional Limits for tasks assessed    Balance     End of Session PT - End of Session Equipment Utilized During Treatment: Gait belt;Right knee immobilizer Activity Tolerance: Patient tolerated treatment well Patient left: in chair;with call bell/phone within reach Nurse Communication: Mobility status   GP     Michael Rose 10/25/2012, 12:25 PM Michael Rose, PT DPT [phone]

## 2013-02-15 ENCOUNTER — Telehealth: Payer: Self-pay | Admitting: Cardiology

## 2013-02-15 MED ORDER — ATORVASTATIN CALCIUM 40 MG PO TABS: 40.0000 mg | ORAL_TABLET | Freq: Every evening | ORAL | Status: DC

## 2013-02-15 NOTE — Telephone Encounter (Signed)
atorvastatin (LIPITOR) 40 MG tablet reqt refill

## 2013-04-13 ENCOUNTER — Encounter: Payer: Self-pay | Admitting: Cardiology

## 2013-04-13 ENCOUNTER — Ambulatory Visit (INDEPENDENT_AMBULATORY_CARE_PROVIDER_SITE_OTHER): Payer: Medicare Other | Admitting: Cardiology

## 2013-04-13 VITALS — BP 147/77 | HR 98 | Ht 71.0 in | Wt 256.0 lb

## 2013-04-13 DIAGNOSIS — E782 Mixed hyperlipidemia: Secondary | ICD-10-CM

## 2013-04-13 DIAGNOSIS — I10 Essential (primary) hypertension: Secondary | ICD-10-CM

## 2013-04-13 DIAGNOSIS — I2510 Atherosclerotic heart disease of native coronary artery without angina pectoris: Secondary | ICD-10-CM

## 2013-04-13 NOTE — Assessment & Plan Note (Signed)
Symptomatically stable on medical therapy. Ischemic testing reviewed from earlier in the year, overall low risk. Continue observation.

## 2013-04-13 NOTE — Progress Notes (Signed)
Clinical Summary Michael Rose is a 62 y.o.male last seen in April by Michael Rose. He has been stable from a cardiac perspective, no angina symptoms. He is undergoing continued rehabilitation after his knee surgery.  Lexiscan Cardiolite in April showed a small inferolateral defect consistent with scar/minor peri-infarct ischemia; EF 52%. He has been managed medically. He did undergo right knee replacement in May in thsi year without cardiac complication.  No recent lipid panel. He reports compliance with his medications.   Allergies  Allergen Reactions  . Iodinated Diagnostic Agents Hives and Itching  . Penicillins Other (See Comments)    chillhood allergy     Current Outpatient Prescriptions  Medication Sig Dispense Refill  . acetaminophen (TYLENOL) 325 MG tablet Take 2 tablets (650 mg total) by mouth every 6 (six) hours as needed.  60 tablet  0  . ALPRAZolam (XANAX) 0.5 MG tablet Take 0.25 mg by mouth at bedtime.       Marland Kitchen aspirin 81 MG tablet Take 81 mg by mouth daily.      Marland Kitchen atorvastatin (LIPITOR) 40 MG tablet Take 1 tablet (40 mg total) by mouth every evening.  30 tablet  6  . dexlansoprazole (DEXILANT) 60 MG capsule Take 60 mg by mouth daily with breakfast.       . Docusate Sodium (DSS) 100 MG CAPS Take 200 mg by mouth at bedtime.      . fluvoxaMINE (LUVOX) 100 MG tablet Take 100 mg by mouth daily with breakfast.       . HYDROcodone-acetaminophen (NORCO/VICODIN) 5-325 MG per tablet Take 1 tablet by mouth 3 (three) times daily.      Marland Kitchen ibuprofen (ADVIL,MOTRIN) 800 MG tablet Take 800 mg by mouth every 8 (eight) hours as needed.       . isosorbide mononitrate (IMDUR) 30 MG 24 hr tablet TAKE ONE TABLET BY MOUTH IN THE EVENING  30 tablet  6  . metoprolol succinate (TOPROL-XL) 50 MG 24 hr tablet Take 1 tablet (50 mg total) by mouth daily. Take with or immediately following a meal.  30 tablet  6  . mirtazapine (REMERON) 30 MG tablet Take 30 mg by mouth at bedtime.      . nitroGLYCERIN  (NITROSTAT) 0.4 MG SL tablet Place 1 tablet (0.4 mg total) under the tongue every 5 (five) minutes as needed for chest pain.  25 tablet  3   No current facility-administered medications for this visit.    Past Medical History  Diagnosis Date  . Hyperlipidemia   . Hypertension   . Osteoarthritis   . Myocardial infarction     IMI 2008  . Coronary atherosclerosis of native coronary artery     BMS to RCA 2008, [facility] cardiology, Michael Rose  . Allergic reaction to contrast dye   . Bipolar affective disorder   . GERD (gastroesophageal reflux disease)   . Ringing in right ear   . Hard of hearing     Right ear    Social History Michael Rose reports that he has never smoked. He has never used smokeless tobacco. Michael Rose reports that he does not drink alcohol.  Review of Systems Head no palpitations, dizziness, syncope. Improving range of motion in his right knee. Occasional leg cramps. Otherwise negative.  Physical Examination Filed Vitals:   04/13/13 1257  BP: 147/77  Pulse: 98   Filed Weights   04/13/13 1257  Weight: 256 lb (116.121 kg)    Overweight male in no acute distress.  HEENT: Conjunctiva and lids normal, oropharynx with poor dentition.  Neck: Supple, no elevated JVP or bruits.  Lungs: Clear to auscultation, diminished breath sounds, nonlabored.  Cardiac: Regular rate aythm, soft S4, no significant murmur.  Abdomen: Nontender, bowel sounds present.  Skin: Warm and dry.  Extremities: No edema, distal pulses one plus.    Problem List and Plan   CORONARY ATHEROSCLEROSIS NATIVE CORONARY ARTERY Symptomatically stable on medical therapy. Ischemic testing reviewed from earlier in the year, overall low risk. Continue observation.  Mixed hyperlipidemia Followup FLP and LFT, on Lipitor.  Essential hypertension, benign Blood pressure mildly elevated today. Continue present regimen, keep followup with Michael Rose.    Michael Rose, M.D., F.A.C.C.

## 2013-04-13 NOTE — Assessment & Plan Note (Signed)
Blood pressure mildly elevated today. Continue present regimen, keep followup with Dr. Sherryll Burger.

## 2013-04-13 NOTE — Patient Instructions (Signed)
   Labs for FLP/LFT - Reminder:  Nothing to eat or drink after 12 midnight prior to labs. Office will contact with results via phone or letter.   Continue all current medications. Your physician wants you to follow up in: 6 months.  You will receive a reminder letter in the mail one-two months in advance.  If you don't receive a letter, please call our office to schedule the follow up appointment

## 2013-04-13 NOTE — Assessment & Plan Note (Signed)
Followup FLP and LFT, on Lipitor.

## 2013-04-19 ENCOUNTER — Telehealth: Payer: Self-pay | Admitting: *Deleted

## 2013-04-19 NOTE — Telephone Encounter (Signed)
Message copied by Eustace Moore on Wed Apr 19, 2013  1:40 PM ------      Message from: Jonelle Sidle      Created: Tue Apr 18, 2013  9:53 AM       Reviewed. LFTs normal, LDL 63. Continue same treatment. ------

## 2013-04-19 NOTE — Telephone Encounter (Signed)
Patient informed. 

## 2013-06-12 ENCOUNTER — Telehealth: Payer: Self-pay

## 2013-06-12 MED ORDER — ISOSORBIDE MONONITRATE ER 30 MG PO TB24: 30.0000 mg | ORAL_TABLET | Freq: Every day | ORAL | Status: DC

## 2013-06-12 NOTE — Telephone Encounter (Signed)
Refill request

## 2013-08-29 ENCOUNTER — Telehealth: Payer: Self-pay | Admitting: Cardiology

## 2013-08-29 NOTE — Telephone Encounter (Signed)
metoprolol succinate (TOPROL-XL) 50 MG 24 hr tablet DR [facility] changed to Metroprolol 25 mg take 2 times a day  Has questions about this medication. Pharmacy sent his refill request to Manuella Ghazi instead of Domenic Polite and this is what they changed too

## 2013-08-29 NOTE — Telephone Encounter (Addendum)
Per Vicente Males, patient was seen on 05/22/13 at Dr. Trena Platt office and medication profile had metoprolol 25 mg twice daily and rx sent. Per Vicente Males, on August 23, 2013 a new prescription was sent to the pharmacy for metoprolol tartrate 25 mg twice daily.

## 2013-08-29 NOTE — Telephone Encounter (Signed)
Spoke with patient in regards to the medication change. Patient said the request went to Dr. Trena Platt office instead of coming here. Nurse advised patient that this dose of lopressor 25 mg bid was equivalent to the toprol xl 50 mg daily. Patient verbalized understanding.

## 2013-10-16 ENCOUNTER — Encounter: Payer: Self-pay | Admitting: Cardiology

## 2013-10-16 ENCOUNTER — Ambulatory Visit (INDEPENDENT_AMBULATORY_CARE_PROVIDER_SITE_OTHER): Payer: Medicare Other | Admitting: Cardiology

## 2013-10-16 VITALS — BP 131/84 | HR 101 | Ht 71.0 in | Wt 262.8 lb

## 2013-10-16 DIAGNOSIS — E782 Mixed hyperlipidemia: Secondary | ICD-10-CM

## 2013-10-16 DIAGNOSIS — I10 Essential (primary) hypertension: Secondary | ICD-10-CM

## 2013-10-16 DIAGNOSIS — I2510 Atherosclerotic heart disease of native coronary artery without angina pectoris: Secondary | ICD-10-CM

## 2013-10-16 NOTE — Progress Notes (Signed)
Clinical Summary [name] is a 63 y.o.male last seen in November 2014. He reports no angina symptoms, mainly limited by chronic knee pain. We reviewed his medications.  Lab work from November 2014 showed cholesterol 144, triglycerides 139, HDL 53, LDL 63, normal LFTs.  Lexiscan Cardiolite in April 2014 showed a small inferolateral defect consistent with scar/minor peri-infarct ischemia; EF 52%. He has been managed medically. ECG today showed sinus tachycardia with no acute ST segment changes.  He did have an echocardiogram done through [facility] in December 2014 that reported normal LVEF of 58%, diastolic dysfunction, no major valvular abnormalities.   Allergies  Allergen Reactions  . Iodinated Diagnostic Agents Hives and Itching  . Penicillins Other (See Comments)    chillhood allergy     Current Outpatient Prescriptions  Medication Sig Dispense Refill  . acetaminophen (TYLENOL) 325 MG tablet Take 2 tablets (650 mg total) by mouth every 6 (six) hours as needed.  60 tablet  0  . ALPRAZolam (XANAX) 0.5 MG tablet Take 0.25 mg by mouth at bedtime.       Marland Kitchen aspirin 81 MG tablet Take 81 mg by mouth daily.      Marland Kitchen atorvastatin (LIPITOR) 40 MG tablet Take 1 tablet (40 mg total) by mouth every evening.  30 tablet  6  . dexlansoprazole (DEXILANT) 60 MG capsule Take 60 mg by mouth daily with breakfast.       . Docusate Sodium (DSS) 100 MG CAPS Take 200 mg by mouth at bedtime.      . fluvoxaMINE (LUVOX) 100 MG tablet Take 100 mg by mouth daily with breakfast.       . HYDROcodone-acetaminophen (NORCO/VICODIN) 5-325 MG per tablet Take 1 tablet by mouth 3 (three) times daily.      Marland Kitchen ibuprofen (ADVIL,MOTRIN) 800 MG tablet Take 800 mg by mouth every 8 (eight) hours as needed.       . isosorbide mononitrate (IMDUR) 30 MG 24 hr tablet Take 1 tablet (30 mg total) by mouth daily.  30 tablet  6  . magnesium oxide (MAG-OX) 400 MG tablet Take 400 mg by mouth 2 (two) times daily.      .  mirtazapine (REMERON) 30 MG tablet Take 30 mg by mouth at bedtime.      . nitroGLYCERIN (NITROSTAT) 0.4 MG SL tablet Place 1 tablet (0.4 mg total) under the tongue every 5 (five) minutes as needed for chest pain.  25 tablet  3  . metoprolol tartrate (LOPRESSOR) 25 MG tablet Take 1 tablet by mouth 2 (two) times daily.       No current facility-administered medications for this visit.    Past Medical History  Diagnosis Date  . Hyperlipidemia   . Hypertension   . Osteoarthritis   . Myocardial infarction     IMI 2008  . Coronary atherosclerosis of native coronary artery     BMS to RCA 2008, [facility] cardiology, Dr. Tamala Julian  . Allergic reaction to contrast dye   . Bipolar affective disorder   . GERD (gastroesophageal reflux disease)   . Ringing in right ear   . Hard of hearing     Right ear    Social History Michael Rose reports that he has never smoked. He has never used smokeless tobacco. Michael Rose reports that he does not drink alcohol.  Review of Systems Negative except as outlined.  Physical Examination Filed Vitals:   10/16/13 1430  BP: 131/84  Pulse: 101   Filed Weights  10/16/13 1430  Weight: 262 lb 12.8 oz (119.205 kg)    Overweight male in no acute distress.  HEENT: Conjunctiva and lids normal, oropharynx with poor dentition.  Neck: Supple, no elevated JVP or bruits.  Lungs: Clear to auscultation, diminished breath sounds, nonlabored.  Cardiac: Regular rate aythm, soft S4, no significant murmur.  Abdomen: Nontender, bowel sounds present.  Skin: Warm and dry.  Extremities: No edema, distal pulses one plus.    Problem List and Plan   CORONARY ATHEROSCLEROSIS NATIVE CORONARY ARTERY Symptomatically stable on medical therapy. ECG reviewed. Continue observation.  Mixed hyperlipidemia Lipid numbers reviewed, LDL has been at goal on statin therapy.  Essential hypertension, benign No change to current regimen.    Michael Rose, M.D., F.A.C.C.

## 2013-10-16 NOTE — Assessment & Plan Note (Signed)
No change to current regimen. 

## 2013-10-16 NOTE — Patient Instructions (Signed)

## 2013-10-16 NOTE — Assessment & Plan Note (Signed)
Lipid numbers reviewed, LDL has been at goal on statin therapy.

## 2013-10-16 NOTE — Assessment & Plan Note (Signed)
Symptomatically stable on medical therapy. ECG reviewed. Continue observation. 

## 2013-11-27 ENCOUNTER — Other Ambulatory Visit: Payer: Self-pay | Admitting: *Deleted

## 2013-11-27 MED ORDER — ATORVASTATIN CALCIUM 40 MG PO TABS: 40.0000 mg | ORAL_TABLET | Freq: Every evening | ORAL | Status: DC

## 2013-12-14 ENCOUNTER — Telehealth: Payer: Self-pay | Admitting: *Deleted

## 2013-12-14 MED ORDER — METOPROLOL SUCCINATE ER 50 MG PO TB24: 50.0000 mg | ORAL_TABLET | Freq: Every day | ORAL | Status: DC

## 2013-12-14 NOTE — Telephone Encounter (Signed)
Patient is requesting toprol xl 50 mg daily instead of lopressor. Nurse advised patient that his prescription would be sent.

## 2013-12-25 ENCOUNTER — Other Ambulatory Visit: Payer: Self-pay | Admitting: Cardiology

## 2014-01-01 IMAGING — CR DG CHEST 2V
2 series · 2 of 2 positions shown · non-contrast
Comparison: 09/11/2011

CLINICAL DATA: Preop for right knee osteoarthritis

CHEST - 2 VIEW

[view not recorded (1 of 2)]
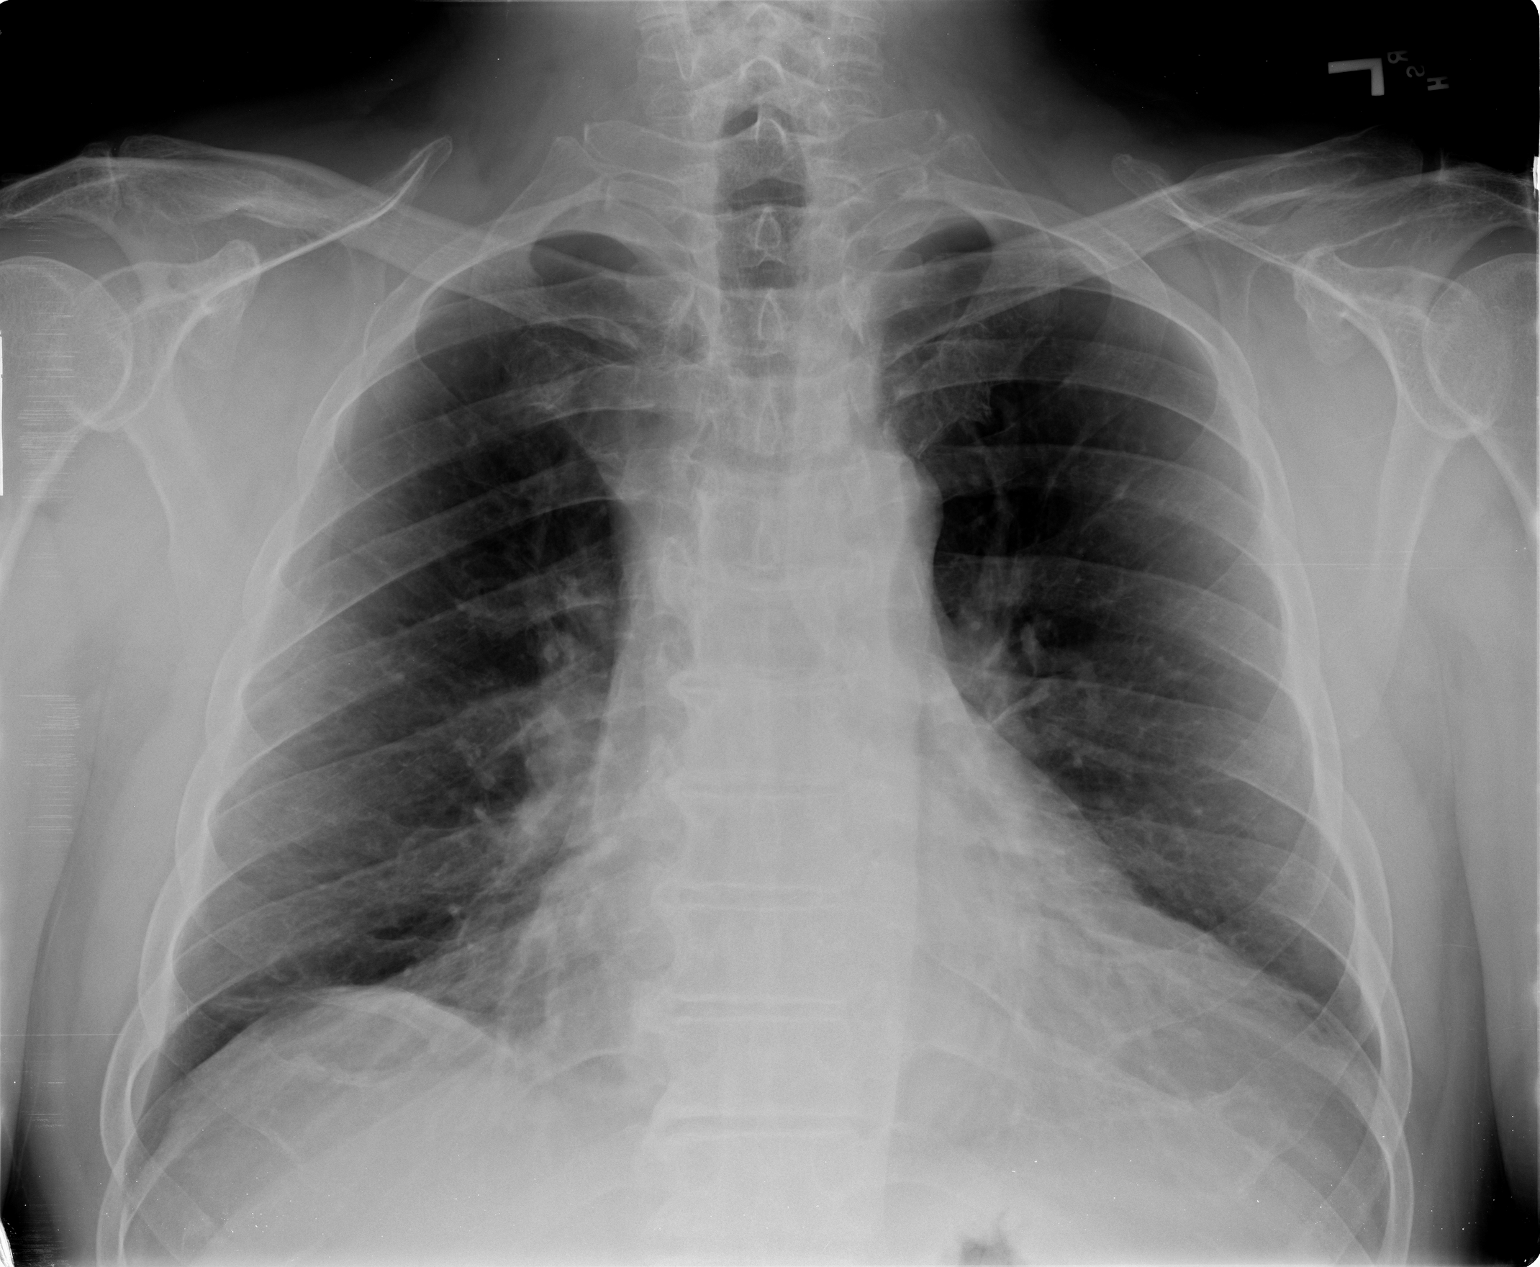

[view not recorded (2 of 2)]
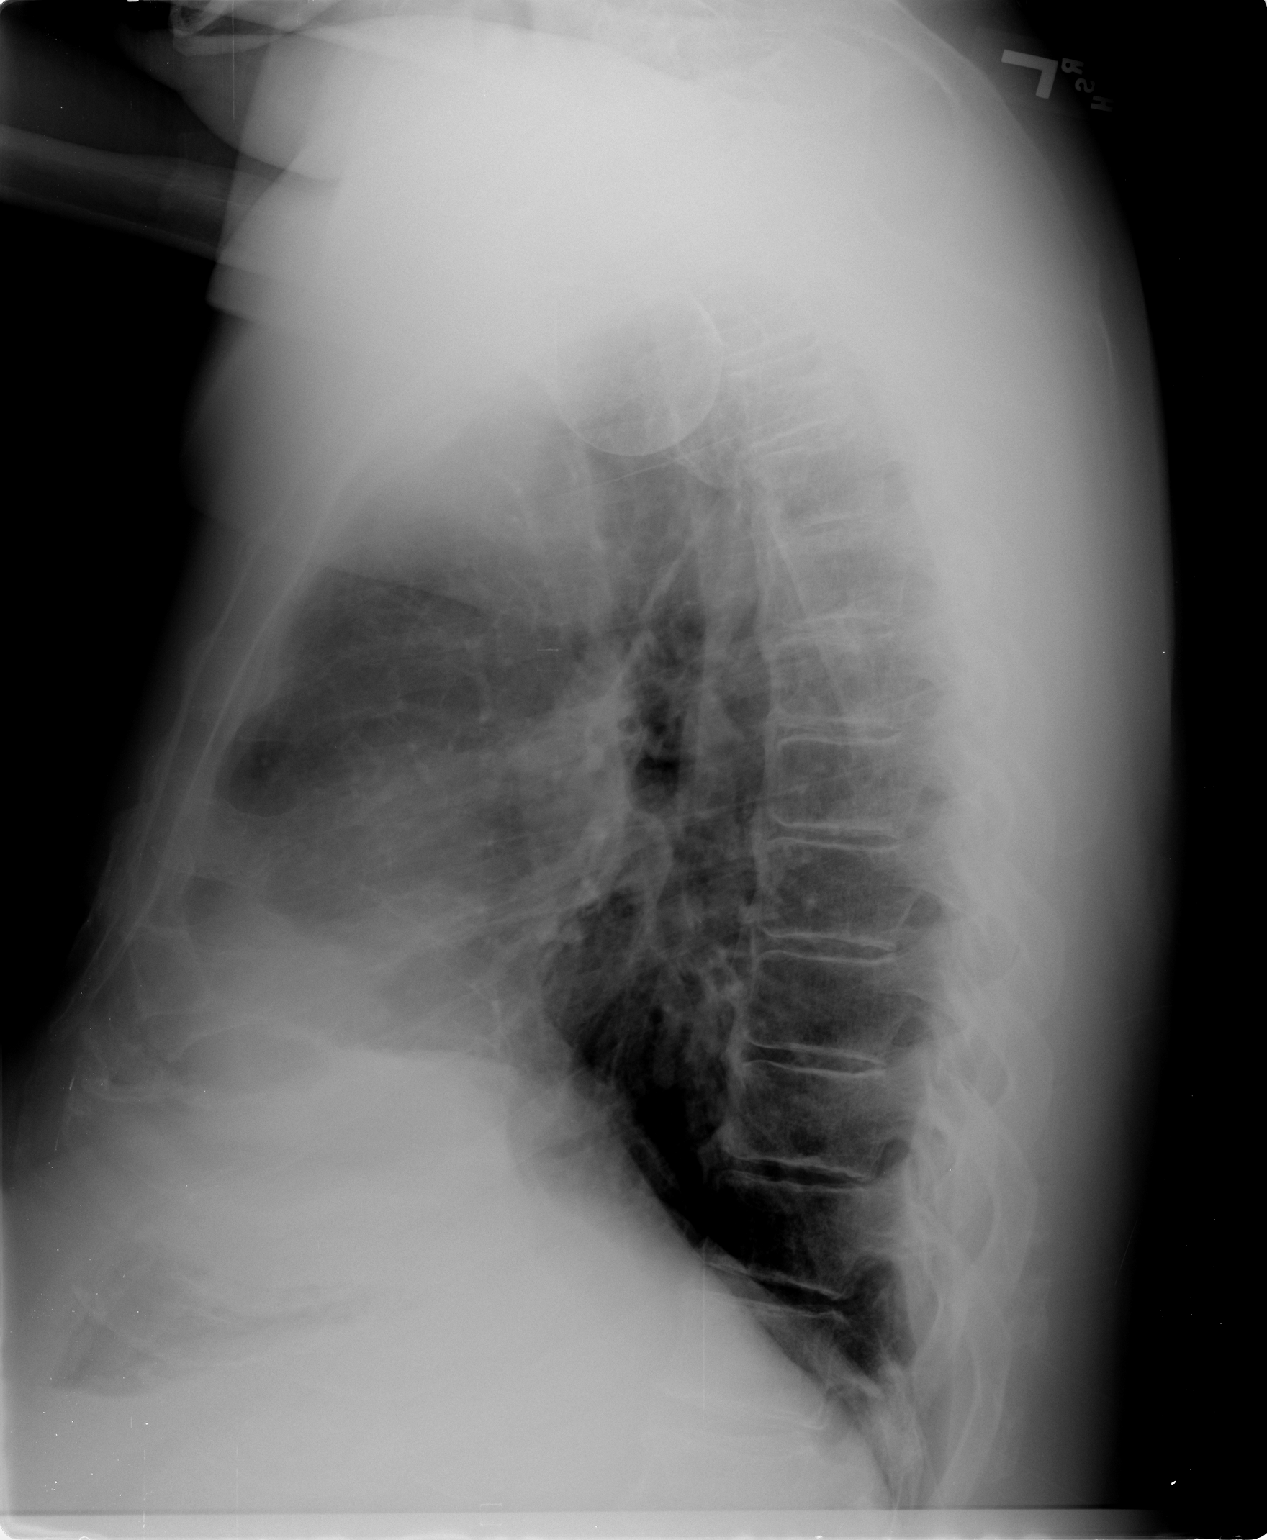

[2 of 2 positions shown; findings below may reference images not displayed]

FINDINGS: Cardiomediastinal silhouette is stable.  Degenerative
changes thoracic spine again noted.  No acute infiltrate or
pulmonary edema.  Mild basilar atelectasis.
IMPRESSION: No active disease.  No significant change.

## 2014-01-23 ENCOUNTER — Telehealth: Payer: Self-pay | Admitting: Cardiology

## 2014-01-23 NOTE — Telephone Encounter (Signed)
[name] is coming into office on Thursday for appointment with Dr. Domenic Polite.

## 2014-01-23 NOTE — Telephone Encounter (Signed)
[name] calls stating that he is having spams in his left side around the rib cage and his legs (calf) .States been going on for awhile but now it is really bad today.  [name] states that a pharmacy put him on CoQ-10 50 mg and Magnesium 400 mg approximately a month.

## 2014-01-24 NOTE — Telephone Encounter (Signed)
Was having same problems in May when he saw Domenic Polite last.  Seems to be more noticeable.  Did see Dr. Manuella Ghazi about this in the past, was put on Robaxin.  Also, states BP & hear rates go up and down.  Advised to bring readings to OV tomorrow.  Can discuss in greater detail with Dr. Domenic Polite.  Patient verbalized understanding.

## 2014-01-25 ENCOUNTER — Ambulatory Visit (INDEPENDENT_AMBULATORY_CARE_PROVIDER_SITE_OTHER): Payer: Medicare Other | Admitting: Cardiology

## 2014-01-25 ENCOUNTER — Encounter: Payer: Self-pay | Admitting: Cardiology

## 2014-01-25 VITALS — BP 115/80 | HR 97 | Wt 258.0 lb

## 2014-01-25 DIAGNOSIS — I2510 Atherosclerotic heart disease of native coronary artery without angina pectoris: Secondary | ICD-10-CM

## 2014-01-25 DIAGNOSIS — I10 Essential (primary) hypertension: Secondary | ICD-10-CM

## 2014-01-25 DIAGNOSIS — R079 Chest pain, unspecified: Secondary | ICD-10-CM

## 2014-01-25 MED ORDER — METOPROLOL SUCCINATE ER 50 MG PO TB24: ORAL_TABLET | ORAL | Status: DC

## 2014-01-25 MED ORDER — MAGNESIUM OXIDE 400 MG PO TABS: 800.0000 mg | ORAL_TABLET | Freq: Every day | ORAL | Status: DC

## 2014-01-25 NOTE — Assessment & Plan Note (Signed)
Thoracic discomfort is very atypical and consistent with cramps/musculoskeletal etiology. ECG shows nonspecific ST-T changes. No ischemic workup being pursued now with reassuring evaluation noted from last year. Plan to continue medical therapy, we will also advance his Toprol XL dose to try and get heart rate down a bit more.

## 2014-01-25 NOTE — Patient Instructions (Signed)
Your physician recommends that you schedule a follow-up appointment in: 6 months. You will receive a reminder letter in the mail in about 4 months reminding you to call and schedule your appointment. If you don't receive this letter, please contact our office. Your physician has recommended you make the following change in your medication:  Increase toprol xl to 75 mg daily. Please take 1&1/2 tablets of your 50 mg tablet daily. Increase your magnesium to 800 mg daily. Continue all other medications the same.

## 2014-01-25 NOTE — Progress Notes (Signed)
Clinical Summary Mr. Mofield is a 63 y.o.male last seen in May. He presents for any early followup visit. States that he has had intense "cramps" in his left and right thoracic region, precipitated by bending over or reaching. This has not been exertional. He also has leg cramps. He has started taking potassium and magnesium supplements. He does not endorse claudication symptoms however.  Lexiscan Cardiolite in April 2014 showed a small inferolateral defect consistent with scar/minor peri-infarct ischemia; EF 52%. He has been managed medically. ECG today shows sinus rhythm with nonspecific ST-T changes. We did go over his home blood pressure and heart rate recordings, blood pressure mildly elevated and heart rates are generally in the 80s to 100s.  He did have an echocardiogram done through [facility] in December 2014 that reported normal LVEF of 46%, diastolic dysfunction, no major valvular abnormalities.   Allergies  Allergen Reactions  . Iodinated Diagnostic Agents Hives and Itching  . Penicillins Other (See Comments)    chillhood allergy     Current Outpatient Prescriptions  Medication Sig Dispense Refill  . aspirin 81 MG tablet Take 81 mg by mouth daily.      Marland Kitchen atorvastatin (LIPITOR) 40 MG tablet Take 1 tablet (40 mg total) by mouth every evening.  30 tablet  6  . cetirizine (ZYRTEC) 10 MG tablet Take 10 mg by mouth daily.      Marland Kitchen co-enzyme Q-10 50 MG capsule Take 50 mg by mouth at bedtime.      Marland Kitchen dexlansoprazole (DEXILANT) 60 MG capsule Take 60 mg by mouth daily with breakfast.       . fluvoxaMINE (LUVOX) 100 MG tablet Take 100 mg by mouth daily with breakfast.       . HYDROcodone-acetaminophen (NORCO/VICODIN) 5-325 MG per tablet Take 1 tablet by mouth as needed.       . isosorbide mononitrate (IMDUR) 30 MG 24 hr tablet TAKE 1 TABLET BY MOUTH EVERY DAY  30 tablet  6  . magnesium oxide (MAG-OX) 400 MG tablet Take 2 tablets (800 mg total) by mouth daily.  60 tablet  6    . metoprolol succinate (TOPROL XL) 50 MG 24 hr tablet Take 1&1/2 tablets by mouth daily. Take with or immediately following a meal.  45 tablet  6  . mirtazapine (REMERON) 30 MG tablet Take 30 mg by mouth at bedtime.      . nitroGLYCERIN (NITROSTAT) 0.4 MG SL tablet Place 1 tablet (0.4 mg total) under the tongue every 5 (five) minutes as needed for chest pain.  25 tablet  3  . Potassium 99 MG TABS Take 99 mg by mouth daily.      . traZODone (DESYREL) 50 MG tablet Take 50 mg by mouth at bedtime.       No current facility-administered medications for this visit.    Past Medical History  Diagnosis Date  . Hyperlipidemia   . Hypertension   . Osteoarthritis   . Myocardial infarction     IMI 2008  . Coronary atherosclerosis of native coronary artery     BMS to RCA 2008, [facility] cardiology, Dr. Tamala Julian  . Allergic reaction to contrast dye   . Bipolar affective disorder   . GERD (gastroesophageal reflux disease)   . Ringing in right ear   . Hard of hearing     Right ear    Social History Mr. Belmontes reports that he has never smoked. He has never used smokeless tobacco. Mr. Cardell reports that  he does not drink alcohol.  Review of Systems Other systems reviewed and negative.  Physical Examination Filed Vitals:   01/25/14 1509  BP: 115/80  Pulse: 97   Filed Weights   01/25/14 1509  Weight: 258 lb (117.028 kg)    Overweight male in no acute distress.  HEENT: Conjunctiva and lids normal, oropharynx with poor dentition.  Neck: Supple, no elevated JVP or bruits.  Lungs: Clear to auscultation, diminished breath sounds, nonlabored.  Cardiac: Regular rate aythm, soft S4, no significant murmur.  Abdomen: Nontender, bowel sounds present.  Skin: Warm and dry.  Extremities: No edema, distal pulses one plus.    Problem List and Plan   CORONARY ATHEROSCLEROSIS NATIVE CORONARY ARTERY Thoracic discomfort is very atypical and consistent with cramps/musculoskeletal etiology. ECG shows  nonspecific ST-T changes. No ischemic workup being pursued now with reassuring evaluation noted from last year. Plan to continue medical therapy, we will also advance his Toprol XL dose to try and get heart rate down a bit more.  Essential hypertension, benign Blood pressure looks good today.    Satira Sark, M.D., F.A.C.C.

## 2014-01-25 NOTE — Assessment & Plan Note (Signed)
Blood pressure looks good today. 

## 2014-04-24 ENCOUNTER — Ambulatory Visit: Payer: Medicare Other | Admitting: Cardiology

## 2014-04-30 ENCOUNTER — Telehealth: Payer: Self-pay | Admitting: Cardiology

## 2014-04-30 MED ORDER — NITROGLYCERIN 0.4 MG SL SUBL: 0.4000 mg | SUBLINGUAL_TABLET | SUBLINGUAL | Status: DC | PRN

## 2014-04-30 NOTE — Telephone Encounter (Signed)
Michael Rose requesting Nitroglycerin -requested to have it called to [facility]

## 2014-05-04 ENCOUNTER — Telehealth: Payer: Self-pay | Admitting: Cardiology

## 2014-05-04 NOTE — Telephone Encounter (Signed)
Question about medication that was called in for cramping.  He was told by [facility] that cholestol medication was changed [id number]

## 2014-05-08 MED ORDER — ATORVASTATIN CALCIUM 40 MG PO TABS: 20.0000 mg | ORAL_TABLET | Freq: Every evening | ORAL | Status: DC

## 2014-05-08 NOTE — Telephone Encounter (Signed)
If he is feeling better having cut Lipitor back to 20 mg daily, we might be able to continue that dose. Alternatively could even go down to Lipitor 10 mg daily before switching to something else.

## 2014-05-08 NOTE — Telephone Encounter (Signed)
Patient informed and verbalized understanding of plan. 

## 2014-05-08 NOTE — Telephone Encounter (Signed)
Called wanting to know what he needs to do about his medication. Michela Pitcher that he has not gotten a reply yet from his message from last week.

## 2014-05-08 NOTE — Addendum Note (Signed)
Addended by: Merlene Laughter on: 05/08/2014 04:31 PM   Modules accepted: Orders

## 2014-05-08 NOTE — Telephone Encounter (Signed)
Stated that he is on Lipitor 40mg , but has been unable to tolerate due to muscle spasms, aching in legs & back.  Has since cut dose in half & is feeling better.  Patient is questioning if MD would change to a totally different medication.  Advised patient to continue as is for now & will send message to provider for advice.

## 2014-07-17 ENCOUNTER — Other Ambulatory Visit: Payer: Self-pay | Admitting: Cardiology

## 2014-07-19 ENCOUNTER — Ambulatory Visit (INDEPENDENT_AMBULATORY_CARE_PROVIDER_SITE_OTHER): Payer: Medicare Other | Admitting: Cardiology

## 2014-07-19 ENCOUNTER — Encounter: Payer: Self-pay | Admitting: Cardiology

## 2014-07-19 VITALS — BP 119/77 | HR 94 | Ht 71.0 in | Wt 246.0 lb

## 2014-07-19 DIAGNOSIS — I2510 Atherosclerotic heart disease of native coronary artery without angina pectoris: Secondary | ICD-10-CM

## 2014-07-19 DIAGNOSIS — E782 Mixed hyperlipidemia: Secondary | ICD-10-CM

## 2014-07-19 DIAGNOSIS — E1159 Type 2 diabetes mellitus with other circulatory complications: Secondary | ICD-10-CM

## 2014-07-19 MED ORDER — ATORVASTATIN CALCIUM 10 MG PO TABS: 10.0000 mg | ORAL_TABLET | Freq: Every day | ORAL | Status: DC

## 2014-07-19 NOTE — Progress Notes (Signed)
Cardiology Office Note  Date: 07/19/2014   ID: DUVALL COMES, DOB 1950-06-24, MRN [id number]  PCP: Monico Blitz, MD  Primary Cardiologist: Rozann Lesches, MD   Chief Complaint  Patient presents with  . Coronary Artery Disease  . Hypertension  . Hyperlipidemia    History of Present Illness: Michael Rose is a 64 y.o. male last seen in August 2015. He presents for a routine visit today. From a cardiac perspective he denies any angina symptoms or increasing shortness of breath. He does tell me that he has been diagnosed with diabetes mellitus since I last saw him, now on oral hypoglycemic agents. He does seem to be more focused on his diet, trying to lose some weight, and increasing his physical activity.  Since prior visit Lipitor dose has been cut back. He was reporting problems with spasms and cramps that seemed to be better on lower dose. We discussed reducing Lipitor down to 10 mg daily at this point.  Last ischemic testing from 2014 is noted below.   Past Medical History  Diagnosis Date  . Hyperlipidemia   . Hypertension   . Osteoarthritis   . Myocardial infarction     IMI 2008  . Coronary atherosclerosis of native coronary artery     BMS to RCA 2008, [facility] cardiology, Dr. Tamala Julian  . Allergic reaction to contrast dye   . Bipolar affective disorder   . GERD (gastroesophageal reflux disease)   . Ringing in right ear   . Hard of hearing     Right ear     Current Outpatient Prescriptions  Medication Sig Dispense Refill  . aspirin 81 MG tablet Take 81 mg by mouth daily.    Marland Kitchen co-enzyme Q-10 50 MG capsule Take 50 mg by mouth at bedtime.    Marland Kitchen dexlansoprazole (DEXILANT) 60 MG capsule Take 60 mg by mouth daily with breakfast.     . fluvoxaMINE (LUVOX) 100 MG tablet Take 100 mg by mouth daily with breakfast.     . isosorbide mononitrate (IMDUR) 30 MG 24 hr tablet TAKE 1 TABLET BY MOUTH EVERY DAY 30 tablet 6  . magnesium oxide (MAG-OX) 400 MG tablet Take 2 tablets (800 mg  total) by mouth daily. 60 tablet 6  . metFORMIN (GLUCOPHAGE) 500 MG tablet Take by mouth every morning.    . metoprolol succinate (TOPROL XL) 50 MG 24 hr tablet Take 1&1/2 tablets by mouth daily. Take with or immediately following a meal. (Patient taking differently: Take one tablet (50mg ) by mouth every morning & 1/2 tab (25mg ) every evening) 45 tablet 6  . mirtazapine (REMERON) 30 MG tablet Take 30 mg by mouth at bedtime.    . nitroGLYCERIN (NITROSTAT) 0.4 MG SL tablet Place 1 tablet (0.4 mg total) under the tongue every 5 (five) minutes as needed for chest pain. 25 tablet 3  . traZODone (DESYREL) 50 MG tablet Take 50 mg by mouth at bedtime.    Marland Kitchen atorvastatin (LIPITOR) 10 MG tablet Take 1 tablet (10 mg total) by mouth daily. 30 tablet 6   No current facility-administered medications for this visit.    Allergies:  Iodinated diagnostic agents and Penicillins   Social History: The patient  reports that he has never smoked. He has never used smokeless tobacco. He reports that he does not drink alcohol or use illicit drugs.   ROS:  Please see the history of present illness. Otherwise, complete review of systems is positive for intermittent leg cramps.  All other systems are  reviewed and negative.    Physical Exam: VS:  BP 119/77 mmHg  Pulse 94  Ht 5\' 11"  (1.803 m)  Wt 246 lb (111.585 kg)  BMI 34.33 kg/m2  SpO2 96%, BMI Body mass index is 34.33 kg/(m^2).  Wt Readings from Last 3 Encounters:  07/19/14 246 lb (111.585 kg)  01/25/14 258 lb (117.028 kg)  10/16/13 262 lb 12.8 oz (119.205 kg)     Overweight male in no acute distress.  HEENT: Conjunctiva and lids normal, oropharynx with poor dentition.  Neck: Supple, no elevated JVP or bruits.  Lungs: Clear to auscultation, diminished breath sounds, nonlabored.  Cardiac: Regular rate aythm, soft S4, no significant murmur.  Abdomen: Nontender, bowel sounds present.  Skin: Warm and dry.  Extremities: No edema, distal pulses one plus.     ECG: ECG is not ordered today.  Other Studies Reviewed Today:  1. Lexiscan Cardiolite in April 2014 showed a small inferolateral defect consistent with scar/minor peri-infarct ischemia; EF 52%.   2. Echocardiogram done through [facility] in December 2014 reported normal LVEF of 70%, diastolic dysfunction, no major valvular abnormalities.  Assessment and Plan:  1. CAD status post BMS to the RCA in 2008. Low risk Cardiolite documented from April 2014. In the absence of significant angina symptoms we will plan to continue medical therapy and observation for now.  2. Reported recent diagnosis of type 2 diabetes mellitus, now on oral hypoglycemic agents. I reinforced is planned for diet with weight loss and exercise plan. Keep follow-up with primary care.  3. Hyperlipidemia, we will reduce Lipitor 10 mg daily to see if he tolerates it better.  Current medicines are reviewed at length with the patient today.  The patient mentioned concerns regarding Lipitor as discussed above, and will be reducing the dose.  Disposition: FU with me in 6 months.   Signed, Satira Sark, MD, [facility] 07/19/2014 2:59 PM    [place] [facility] [place], [place], [place] [address] Phone: [phone]; Fax: [phone]

## 2014-07-19 NOTE — Patient Instructions (Signed)
Your physician recommends that you schedule a follow-up appointment in: 6 months. You will receive a reminder letter in the mail in about 4 months reminding you to call and schedule your appointment. If you don't receive this letter, please contact our office. Your physician has recommended you make the following change in your medication:  Change atorvastatin to 10 mg daily. New prescription sent today. Continue all other medications the same.

## 2014-08-15 ENCOUNTER — Other Ambulatory Visit: Payer: Self-pay | Admitting: *Deleted

## 2014-08-15 MED ORDER — METOPROLOL SUCCINATE ER 50 MG PO TB24: ORAL_TABLET | ORAL | Status: DC

## 2015-01-15 ENCOUNTER — Encounter: Payer: Self-pay | Admitting: Cardiology

## 2015-01-15 ENCOUNTER — Ambulatory Visit (INDEPENDENT_AMBULATORY_CARE_PROVIDER_SITE_OTHER): Payer: Medicare Other | Admitting: Cardiology

## 2015-01-15 VITALS — BP 130/92 | HR 98 | Ht 71.0 in | Wt 243.0 lb

## 2015-01-15 DIAGNOSIS — I10 Essential (primary) hypertension: Secondary | ICD-10-CM | POA: Diagnosis not present

## 2015-01-15 DIAGNOSIS — I2510 Atherosclerotic heart disease of native coronary artery without angina pectoris: Secondary | ICD-10-CM

## 2015-01-15 DIAGNOSIS — E782 Mixed hyperlipidemia: Secondary | ICD-10-CM | POA: Diagnosis not present

## 2015-01-15 NOTE — Progress Notes (Signed)
Cardiology Office Note  Date: 01/15/2015   ID: Michael Rose, DOB Apr 16, 1951, MRN [id number]  PCP: Michael Blitz, MD  Primary Cardiologist: Michael Lesches, MD   Chief Complaint  Patient presents with  . Coronary Artery Disease  . Hyperlipidemia    History of Present Illness: Michael Rose is a 63 y.o. male last seen in February. He presents for a routine follow-up visit. From a cardiac perspective, he reports no problems with angina or increasing shortness of breath. He has been swimming at a local pool this summer. Has not been walking much in the high heat and humidity.  We reviewed his medications which are outlined below. He will be having a physical with his primary care provider within the next 6 months. Follow-up ECG today is normal.  He continues on Lipitor at 10 mg daily without obvious side effects. He will be having lab work with his physical. He does tell me that his blood sugars have been better controlled.   Past Medical History  Diagnosis Date  . Hyperlipidemia   . Hypertension   . Osteoarthritis   . Myocardial infarction     IMI 2008  . Coronary atherosclerosis of native coronary artery     BMS to RCA 2008, [facility] cardiology, Dr. Tamala Rose  . Allergic reaction to contrast dye   . Bipolar affective disorder   . GERD (gastroesophageal reflux disease)   . Ringing in right ear   . Hard of hearing     Right ear    Current Outpatient Prescriptions  Medication Sig Dispense Refill  . aspirin 81 MG tablet Take 81 mg by mouth daily.    Marland Kitchen atorvastatin (LIPITOR) 10 MG tablet Take 1 tablet (10 mg total) by mouth daily. 30 tablet 6  . co-enzyme Q-10 50 MG capsule Take 50 mg by mouth at bedtime.    Marland Kitchen dexlansoprazole (DEXILANT) 60 MG capsule Take 60 mg by mouth daily with breakfast.     . fluvoxaMINE (LUVOX) 100 MG tablet Take 100 mg by mouth daily with breakfast.     . isosorbide mononitrate (IMDUR) 30 MG 24 hr tablet TAKE 1 TABLET BY MOUTH EVERY DAY 30 tablet 6  .  magnesium oxide (MAG-OX) 400 MG tablet Take 2 tablets (800 mg total) by mouth daily. 60 tablet 6  . metFORMIN (GLUCOPHAGE) 500 MG tablet Take by mouth every morning.    . metoprolol succinate (TOPROL XL) 50 MG 24 hr tablet Take one tablet (50mg ) by mouth every morning & 1/2 tab (25mg ) every evening 45 tablet 6  . mirtazapine (REMERON) 30 MG tablet Take 30 mg by mouth at bedtime.    . nitroGLYCERIN (NITROSTAT) 0.4 MG SL tablet Place 1 tablet (0.4 mg total) under the tongue every 5 (five) minutes as needed for chest pain. 25 tablet 3  . traZODone (DESYREL) 50 MG tablet Take 50 mg by mouth at bedtime.     No current facility-administered medications for this visit.    Allergies:  Iodinated diagnostic agents and Penicillins   Social History: The patient  reports that he has never smoked. He has never used smokeless tobacco. He reports that he does not drink alcohol or use illicit drugs.   ROS:  Please see the history of present illness. Otherwise, complete review of systems is positive for none.  All other systems are reviewed and negative.   Physical Exam: VS:  BP 130/92 mmHg  Pulse 98  Ht 5\' 11"  (1.803 m)  Wt 243 lb (110.224  kg)  BMI 33.91 kg/m2  SpO2 95%, BMI Body mass index is 33.91 kg/(m^2).  Wt Readings from Last 3 Encounters:  01/15/15 243 lb (110.224 kg)  07/19/14 246 lb (111.585 kg)  01/25/14 258 lb (117.028 kg)     Overweight male in no acute distress.  HEENT: Conjunctiva and lids normal, oropharynx with poor dentition.  Neck: Supple, no elevated JVP or bruits.  Lungs: Clear to auscultation, diminished breath sounds, nonlabored.  Cardiac: Regular rate aythm, soft S4, no significant murmur.  Abdomen: Nontender, bowel sounds present.  Skin: Warm and dry.  Extremities: No edema, distal pulses one plus.    ECG: ECG is ordered today and shows normal sinus rhythm.  Other Studies Reviewed Today:  1. Lexiscan Cardiolite in April 2014 showed a small inferolateral  defect consistent with scar/minor peri-infarct ischemia; EF 52%.   2. Echocardiogram done through [facility] in December 2014 reported normal LVEF of 56%, diastolic dysfunction, no major valvular abnormalities.  Assessment and Plan:  1. Symptomatically stable CAD status post BMS to the RCA in 2008. Plan is to continue medical therapy and observation. I encouraged regular exercise plan and weight loss.  2. Hyperlipidemia, continues on Lipitor. Keep follow-up with primary care provider for lab work.  Current medicines were reviewed with the patient today.   Orders Placed This Encounter  Procedures  . EKG 12-Lead    Disposition: FU with me in 6 months.   Signed, Michael Sark, MD, [facility] 01/15/2015 1:27 PM    [facility] at [address], [place], [place] [address] Phone: [phone]; Fax: [phone]

## 2015-01-15 NOTE — Patient Instructions (Signed)
Your physician recommends that you continue on your current medications as directed. Please refer to the Current Medication list given to you today. Your physician recommends that you schedule a follow-up appointment in: 6 months. You will receive a reminder letter in the mail in about 4 months reminding you to call and schedule your appointment. If you don't receive this letter, please contact our office. 

## 2015-03-27 ENCOUNTER — Other Ambulatory Visit: Payer: Self-pay | Admitting: Cardiology

## 2015-04-12 ENCOUNTER — Other Ambulatory Visit: Payer: Self-pay | Admitting: Cardiology

## 2015-06-19 DIAGNOSIS — Z6836 Body mass index (BMI) 36.0-36.9, adult: Secondary | ICD-10-CM | POA: Diagnosis not present

## 2015-06-19 DIAGNOSIS — R197 Diarrhea, unspecified: Secondary | ICD-10-CM | POA: Diagnosis not present

## 2015-06-19 DIAGNOSIS — E1165 Type 2 diabetes mellitus with hyperglycemia: Secondary | ICD-10-CM | POA: Diagnosis not present

## 2015-06-19 DIAGNOSIS — Z789 Other specified health status: Secondary | ICD-10-CM | POA: Diagnosis not present

## 2015-06-28 DIAGNOSIS — E1165 Type 2 diabetes mellitus with hyperglycemia: Secondary | ICD-10-CM | POA: Diagnosis not present

## 2015-06-28 DIAGNOSIS — A045 Campylobacter enteritis: Secondary | ICD-10-CM | POA: Diagnosis not present

## 2015-07-03 DIAGNOSIS — H4312 Vitreous hemorrhage, left eye: Secondary | ICD-10-CM | POA: Diagnosis not present

## 2015-07-03 DIAGNOSIS — H3342 Traction detachment of retina, left eye: Secondary | ICD-10-CM | POA: Diagnosis not present

## 2015-07-03 DIAGNOSIS — H43812 Vitreous degeneration, left eye: Secondary | ICD-10-CM | POA: Diagnosis not present

## 2015-07-03 DIAGNOSIS — H33032 Retinal detachment with giant retinal tear, left eye: Secondary | ICD-10-CM | POA: Diagnosis not present

## 2015-07-03 DIAGNOSIS — H33103 Unspecified retinoschisis, bilateral: Secondary | ICD-10-CM | POA: Diagnosis not present

## 2015-07-03 DIAGNOSIS — H33322 Round hole, left eye: Secondary | ICD-10-CM | POA: Diagnosis not present

## 2015-07-16 DIAGNOSIS — H33312 Horseshoe tear of retina without detachment, left eye: Secondary | ICD-10-CM | POA: Diagnosis not present

## 2015-07-18 ENCOUNTER — Ambulatory Visit: Payer: Medicare Other | Admitting: Cardiology

## 2015-07-26 ENCOUNTER — Other Ambulatory Visit: Payer: Self-pay | Admitting: *Deleted

## 2015-07-26 MED ORDER — ATORVASTATIN CALCIUM 10 MG PO TABS: 10.0000 mg | ORAL_TABLET | Freq: Every day | ORAL | Status: DC

## 2015-08-09 ENCOUNTER — Ambulatory Visit (INDEPENDENT_AMBULATORY_CARE_PROVIDER_SITE_OTHER): Payer: Medicare Other | Admitting: Cardiology

## 2015-08-09 ENCOUNTER — Encounter: Payer: Self-pay | Admitting: Cardiology

## 2015-08-09 VITALS — BP 123/73 | HR 90 | Ht 71.0 in | Wt 245.6 lb

## 2015-08-09 DIAGNOSIS — I10 Essential (primary) hypertension: Secondary | ICD-10-CM | POA: Diagnosis not present

## 2015-08-09 DIAGNOSIS — E782 Mixed hyperlipidemia: Secondary | ICD-10-CM | POA: Diagnosis not present

## 2015-08-09 DIAGNOSIS — I2510 Atherosclerotic heart disease of native coronary artery without angina pectoris: Secondary | ICD-10-CM

## 2015-08-09 NOTE — Patient Instructions (Signed)
Your physician recommends that you continue on your current medications as directed. Please refer to the Current Medication list given to you today. Your physician recommends that you schedule a follow-up appointment in: 6 months. You will receive a reminder letter in the mail in about 4 months reminding you to call and schedule your appointment. If you don't receive this letter, please contact our office. 

## 2015-08-09 NOTE — Progress Notes (Signed)
Cardiology Office Note  Date: 08/09/2015   ID: Michael Rose, DOB May 11, 1951, MRN [id number]  PCP: Monico Blitz, MD  Primary Cardiologist: Rozann Lesches, MD   Chief Complaint  Patient presents with  . Coronary Artery Disease    History of Present Illness: Michael Rose is a 65 y.o. male last seen in August 2016. He presents for a routine follow-up visit. No angina symptoms on present medical therapy and NYHA class II dyspnea. He states that he has had some other health concerns over the last several months, had a "stomach infection" treated with antibiotics, and more recently emergency left eye surgery with a "torn retina."  He continues to follow with Dr. Manuella Ghazi, is due for a follow-up physical with lab work.  I reviewed his most recent cardiac testing is outlined below. We continue with strategy of observation at this point.  Past Medical History  Diagnosis Date  . Hyperlipidemia   . Hypertension   . Osteoarthritis   . Myocardial infarction ([place])     IMI 2008  . Coronary atherosclerosis of native coronary artery     BMS to RCA 2008, [facility] cardiology, Dr. Tamala Julian  . Allergic reaction to contrast dye   . Bipolar affective disorder ([place])   . GERD (gastroesophageal reflux disease)   . Ringing in right ear   . Hard of hearing     Right ear    Current Outpatient Prescriptions  Medication Sig Dispense Refill  . aspirin 81 MG tablet Take 81 mg by mouth daily.    Marland Kitchen atorvastatin (LIPITOR) 10 MG tablet Take 1 tablet (10 mg total) by mouth daily. 90 tablet 3  . dexlansoprazole (DEXILANT) 60 MG capsule Take 60 mg by mouth daily with breakfast.     . empagliflozin (JARDIANCE) 25 MG TABS tablet Take 25 mg by mouth daily.    . fluvoxaMINE (LUVOX) 100 MG tablet Take 100 mg by mouth daily with breakfast.     . isosorbide mononitrate (IMDUR) 30 MG 24 hr tablet TAKE 1 TABLET BY MOUTH EVERY DAY 30 tablet 6  . metoprolol succinate (TOPROL-XL) 50 MG 24 hr tablet TAKE ONE TABLET BY MOUTH  EVERY MORNING & 1/2 TABLET BY MOUTH EVERY EVENING 45 tablet 6  . mirtazapine (REMERON) 30 MG tablet Take 30 mg by mouth at bedtime.    . nitroGLYCERIN (NITROSTAT) 0.4 MG SL tablet Place 1 tablet (0.4 mg total) under the tongue every 5 (five) minutes as needed for chest pain. 25 tablet 3  . traZODone (DESYREL) 50 MG tablet Take 50 mg by mouth at bedtime.     No current facility-administered medications for this visit.   Allergies:  Iodinated diagnostic agents and Penicillins   Social History: The patient  reports that he has never smoked. He has never used smokeless tobacco. He reports that he does not drink alcohol or use illicit drugs.   ROS:  Please see the history of present illness. Otherwise, complete review of systems is positive for recent left eye visual changes related to surgery. Peripheral neuropathy symptoms. All other systems are reviewed and negative.   Physical Exam: VS:  BP 123/73 mmHg  Pulse 90  Ht 5\' 11"  (1.803 m)  Wt 245 lb 9.6 oz (111.403 kg)  BMI 34.27 kg/m2  SpO2 95%, BMI Body mass index is 34.27 kg/(m^2).  Wt Readings from Last 3 Encounters:  08/09/15 245 lb 9.6 oz (111.403 kg)  01/15/15 243 lb (110.224 kg)  07/19/14 246 lb (111.585 kg)  Overweight male in no acute distress.  HEENT: Conjunctiva and lids normal, oropharynx with poor dentition.  Neck: Supple, no elevated JVP or bruits.  Lungs: Clear to auscultation, diminished breath sounds, nonlabored.  Cardiac: Regular rate aythm, soft S4, no significant murmur.  Abdomen: Nontender, bowel sounds present.  Skin: Warm and dry.  Extremities: No edema, distal pulses one plus.   ECG: I personally reviewed the prior tracing from 01/15/2015 which showed normal sinus rhythm.  Recent Labwork:  November 2014: Cholesterol 144, triglycerides 139, HDL 53, LDL 63, AST 26, ALT 24  Other Studies Reviewed Today:  Lexiscan Cardiolite April 2014: Small inferolateral defect consistent with scar/minor  peri-infarct ischemia; EF 52%.   Echocardiogram [facility]) December 2014: Normal LVEF of [id number], diastolic dysfunction, no major valvular abnormalities.  Assessment and Plan:  1. Symptomatically stable CAD status post BMS the RCA in 2008. Cardiolite from 2014 is outlined above. He does not report any significant angina on medical therapy and we will continue observation for now.  2. Essential hypertension, blood pressure control is good today.  3. Hyperlipidemia, on Lipitor. Due for follow-up lab work with Dr. Manuella Ghazi.  Current medicines were reviewed with the patient today.  Disposition: FU with me in 6 months.   Signed, Satira Sark, MD, [facility] 08/09/2015 10:55 AM    [place] at [place], [place],  [address] Phone: [phone]; Fax: [phone]

## 2015-08-21 DIAGNOSIS — I10 Essential (primary) hypertension: Secondary | ICD-10-CM | POA: Diagnosis not present

## 2015-08-21 DIAGNOSIS — I2510 Atherosclerotic heart disease of native coronary artery without angina pectoris: Secondary | ICD-10-CM | POA: Diagnosis not present

## 2015-08-21 DIAGNOSIS — E119 Type 2 diabetes mellitus without complications: Secondary | ICD-10-CM | POA: Diagnosis not present

## 2015-08-21 DIAGNOSIS — F329 Major depressive disorder, single episode, unspecified: Secondary | ICD-10-CM | POA: Diagnosis not present

## 2015-10-01 DIAGNOSIS — F428 Other obsessive-compulsive disorder: Secondary | ICD-10-CM | POA: Diagnosis not present

## 2015-11-07 ENCOUNTER — Other Ambulatory Visit: Payer: Self-pay | Admitting: Cardiology

## 2015-11-07 MED ORDER — METOPROLOL SUCCINATE ER 50 MG PO TB24
ORAL_TABLET | ORAL | Status: DC
Start: 2015-11-07 — End: 2016-11-25

## 2015-11-07 NOTE — Telephone Encounter (Signed)
1. Which medications need to be refilled? (please list name of each medication and dose if known) metoprolol succinate (TOPROL-XL) 50 MG 24 hr tablet [id number]   2. Which pharmacy/location (including street and city if local pharmacy) is medication to be sent to? [facility]  3. Do they need a 30 day or 90 day supply? Would like to do 90 if he can

## 2015-11-19 DIAGNOSIS — F329 Major depressive disorder, single episode, unspecified: Secondary | ICD-10-CM | POA: Diagnosis not present

## 2015-11-19 DIAGNOSIS — I10 Essential (primary) hypertension: Secondary | ICD-10-CM | POA: Diagnosis not present

## 2015-11-19 DIAGNOSIS — I2510 Atherosclerotic heart disease of native coronary artery without angina pectoris: Secondary | ICD-10-CM | POA: Diagnosis not present

## 2015-11-19 DIAGNOSIS — E119 Type 2 diabetes mellitus without complications: Secondary | ICD-10-CM | POA: Diagnosis not present

## 2015-11-27 DIAGNOSIS — K529 Noninfective gastroenteritis and colitis, unspecified: Secondary | ICD-10-CM | POA: Diagnosis not present

## 2015-11-27 DIAGNOSIS — E1165 Type 2 diabetes mellitus with hyperglycemia: Secondary | ICD-10-CM | POA: Diagnosis not present

## 2015-11-27 DIAGNOSIS — N9989 Other postprocedural complications and disorders of genitourinary system: Secondary | ICD-10-CM | POA: Diagnosis not present

## 2016-02-17 ENCOUNTER — Encounter: Payer: Self-pay | Admitting: *Deleted

## 2016-02-18 ENCOUNTER — Encounter: Payer: Self-pay | Admitting: Cardiology

## 2016-02-18 ENCOUNTER — Ambulatory Visit (INDEPENDENT_AMBULATORY_CARE_PROVIDER_SITE_OTHER): Payer: Medicare Other | Admitting: Cardiology

## 2016-02-18 VITALS — BP 120/77 | HR 88 | Ht 71.0 in | Wt 243.4 lb

## 2016-02-18 DIAGNOSIS — I10 Essential (primary) hypertension: Secondary | ICD-10-CM

## 2016-02-18 DIAGNOSIS — E782 Mixed hyperlipidemia: Secondary | ICD-10-CM

## 2016-02-18 DIAGNOSIS — I2510 Atherosclerotic heart disease of native coronary artery without angina pectoris: Secondary | ICD-10-CM

## 2016-02-18 NOTE — Progress Notes (Signed)
Cardiology Office Note  Date: 02/18/2016   ID: Michael Rose, DOB 1950-06-28, MRN [id number]  PCP: Michael Blitz, MD  Primary Cardiologist: Michael Lesches, MD   Chief Complaint  Patient presents with  . Coronary Artery Disease    History of Present Illness: Michael Rose is a 65 y.o. male last seen in March. He presents for a routine follow-up visit. Since last evaluation he has not had any significant angina symptoms or required nitroglycerin. 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 He does have limitations related to arthritis pains in his legs, tries to do some walking for exercise.  I reviewed his medications which are outlined below. Cardiac regimen has been stable. He will be having follow-up lab work with physical soon.  ECG today shows sinus rhythm with nonspecific T-wave changes. Last ischemic testing was in 2014.  Past Medical History:  Diagnosis Date  . Allergic reaction to contrast dye   . Bipolar affective disorder (Michael Rose)   . Coronary atherosclerosis of native coronary artery    BMS to RCA 2008, [facility] cardiology, Dr. Tamala Julian  . GERD (gastroesophageal reflux disease)   . Hard of hearing    Right ear  . Hyperlipidemia   . Hypertension   . Myocardial infarction (Michael Rose)    IMI 2008  . Osteoarthritis   . Ringing in right ear     Past Surgical History:  Procedure Laterality Date  . CHOLECYSTECTOMY  2013  . JOINT REPLACEMENT    . TOTAL KNEE ARTHROPLASTY     Left  . TOTAL KNEE ARTHROPLASTY Right 10/21/2012   Dr French Ana  . TOTAL KNEE ARTHROPLASTY Right 10/21/2012   Procedure: TOTAL KNEE ARTHROPLASTY;  Surgeon: Michael Rose., MD;  Location: [facility];  Service: Orthopedics;  Laterality: Right;  Michael Rose VASECTOMY      Current Outpatient Prescriptions  Medication Sig Dispense Refill  . acetaminophen (TYLENOL) 500 MG tablet Take 500 mg by mouth 2 (two) times daily.    Michael Rose aspirin 81 MG tablet Take 81 mg by mouth daily.    Michael Rose atorvastatin  (LIPITOR) 10 MG tablet Take 1 tablet (10 mg total) by mouth daily. 90 tablet 3  . dexlansoprazole (DEXILANT) 60 MG capsule Take 60 mg by mouth daily with breakfast.     . empagliflozin (JARDIANCE) 25 MG TABS tablet Take 25 mg by mouth daily.    . fluvoxaMINE (LUVOX) 100 MG tablet Take 100 mg by mouth daily with breakfast.     . isosorbide mononitrate (IMDUR) 30 MG 24 hr tablet TAKE 1 TABLET BY MOUTH EVERY DAY 30 tablet 6  . metoprolol succinate (TOPROL-XL) 50 MG 24 hr tablet TAKE ONE TABLET BY MOUTH EVERY MORNING & 1/2 TABLET BY MOUTH EVERY EVENING 135 tablet 3  . mirtazapine (REMERON) 30 MG tablet Take 30 mg by mouth at bedtime.    Michael Rose MISC NATURAL PRODUCTS PO Take 1 tablet by mouth every morning. Cholesterol Rx Support    . nitroGLYCERIN (NITROSTAT) 0.4 MG SL tablet Place 1 tablet (0.4 mg total) under the tongue every 5 (five) minutes as needed for chest pain. 25 tablet 3  . traZODone (DESYREL) 50 MG tablet Take 50 mg by mouth at bedtime.     No current facility-administered medications for this visit.    Allergies:  Iodinated diagnostic agents and Penicillins   Social History: The patient  reports that he has never smoked. He has never used smokeless tobacco. He reports that he does  not drink alcohol or use drugs.   ROS:  Please see the history of present illness. Otherwise, complete review of systems is positive for arthritis pains and stiffness.  All other systems are reviewed and negative.   Physical Exam: VS:  BP 120/77   Pulse 88   Ht 5\' 11"  (1.803 m)   Wt 243 lb 6.4 oz (110.4 kg)   SpO2 96%   BMI 33.95 kg/m , BMI Body mass index is 33.95 kg/m.  Wt Readings from Last 3 Encounters:  02/18/16 243 lb 6.4 oz (110.4 kg)  08/09/15 245 lb 9.6 oz (111.4 kg)  01/15/15 243 lb (110.2 kg)    Overweight male in no acute distress.  HEENT: Conjunctiva and lids normal, oropharynx with poor dentition.  Neck: Supple, no elevated JVP or bruits.  Lungs: Clear to auscultation, diminished  breath sounds, nonlabored.  Cardiac: Regular rate aythm, soft S4, no significant murmur.  Abdomen: Nontender, bowel sounds present.  Skin: Warm and dry.  Extremities: No edema, distal pulses one plus.  Neuropsychiatric: Alert and oriented 3, affect appropriate.   ECG: I personally reviewed the tracing from 01/15/2015 which showed normal sinus rhythm.  Other Studies Reviewed Today:  Lexiscan Cardiolite April 2014: Small inferolateral defect consistent with scar/minor peri-infarct ischemia; EF 52%.   Echocardiogram [facility]) December 2014: Normal LVEF of [id number], diastolic dysfunction, no major valvular abnormalities.  Assessment and Plan:  1. CAD status post BMS to the RCA in 2008. He is doing well without active angina symptoms on medical therapy. Last Cardiolite study was in 2014. I reviewed his ECG and we will continue with observation for now.  2. Essential hypertension, blood pressure control is good today.  3. Hyperlipidemia, continues on low-dose Lipitor. Due for follow-up lab work with Dr. Manuella Rose.  Current medicines were reviewed with the patient today.  Disposition: Follow-up with me in 6 months.  Signed, Satira Sark, MD, [facility] 02/18/2016 3:36 PM    [place] at [place], [place], [place] [address] Phone: [phone]; Fax: [phone]

## 2016-02-18 NOTE — Patient Instructions (Signed)
Your physician wants you to follow-up in: 6 months with Dr. McDowell You will receive a reminder letter in the mail two months in advance. If you don't receive a letter, please call our office to schedule the follow-up appointment.  Your physician recommends that you continue on your current medications as directed. Please refer to the Current Medication list given to you today.  Thank you for choosing [facility]!!    

## 2016-03-04 DIAGNOSIS — Z23 Encounter for immunization: Secondary | ICD-10-CM | POA: Diagnosis not present

## 2016-03-09 DIAGNOSIS — E1165 Type 2 diabetes mellitus with hyperglycemia: Secondary | ICD-10-CM | POA: Diagnosis not present

## 2016-03-09 DIAGNOSIS — Z1211 Encounter for screening for malignant neoplasm of colon: Secondary | ICD-10-CM | POA: Diagnosis not present

## 2016-03-09 DIAGNOSIS — Z1389 Encounter for screening for other disorder: Secondary | ICD-10-CM | POA: Diagnosis not present

## 2016-03-09 DIAGNOSIS — Z299 Encounter for prophylactic measures, unspecified: Secondary | ICD-10-CM | POA: Diagnosis not present

## 2016-03-09 DIAGNOSIS — Z7189 Other specified counseling: Secondary | ICD-10-CM | POA: Diagnosis not present

## 2016-03-09 DIAGNOSIS — Z0000 Encounter for general adult medical examination without abnormal findings: Secondary | ICD-10-CM | POA: Diagnosis not present

## 2016-03-10 DIAGNOSIS — F428 Other obsessive-compulsive disorder: Secondary | ICD-10-CM | POA: Diagnosis not present

## 2016-03-11 ENCOUNTER — Encounter (INDEPENDENT_AMBULATORY_CARE_PROVIDER_SITE_OTHER): Payer: Self-pay | Admitting: *Deleted

## 2016-03-11 DIAGNOSIS — Z79899 Other long term (current) drug therapy: Secondary | ICD-10-CM | POA: Diagnosis not present

## 2016-03-11 DIAGNOSIS — Z0000 Encounter for general adult medical examination without abnormal findings: Secondary | ICD-10-CM | POA: Diagnosis not present

## 2016-03-11 DIAGNOSIS — R5383 Other fatigue: Secondary | ICD-10-CM | POA: Diagnosis not present

## 2016-03-11 DIAGNOSIS — Z125 Encounter for screening for malignant neoplasm of prostate: Secondary | ICD-10-CM | POA: Diagnosis not present

## 2016-03-11 DIAGNOSIS — E7800 Pure hypercholesterolemia, unspecified: Secondary | ICD-10-CM | POA: Diagnosis not present

## 2016-03-24 DIAGNOSIS — E1165 Type 2 diabetes mellitus with hyperglycemia: Secondary | ICD-10-CM | POA: Diagnosis not present

## 2016-03-24 DIAGNOSIS — Z299 Encounter for prophylactic measures, unspecified: Secondary | ICD-10-CM | POA: Diagnosis not present

## 2016-03-24 DIAGNOSIS — Z6834 Body mass index (BMI) 34.0-34.9, adult: Secondary | ICD-10-CM | POA: Diagnosis not present

## 2016-03-24 DIAGNOSIS — F319 Bipolar disorder, unspecified: Secondary | ICD-10-CM | POA: Diagnosis not present

## 2016-03-24 DIAGNOSIS — I2510 Atherosclerotic heart disease of native coronary artery without angina pectoris: Secondary | ICD-10-CM | POA: Diagnosis not present

## 2016-03-26 DIAGNOSIS — I10 Essential (primary) hypertension: Secondary | ICD-10-CM | POA: Diagnosis not present

## 2016-03-26 DIAGNOSIS — F329 Major depressive disorder, single episode, unspecified: Secondary | ICD-10-CM | POA: Diagnosis not present

## 2016-03-26 DIAGNOSIS — I2510 Atherosclerotic heart disease of native coronary artery without angina pectoris: Secondary | ICD-10-CM | POA: Diagnosis not present

## 2016-03-26 DIAGNOSIS — E119 Type 2 diabetes mellitus without complications: Secondary | ICD-10-CM | POA: Diagnosis not present

## 2016-04-28 DIAGNOSIS — E1165 Type 2 diabetes mellitus with hyperglycemia: Secondary | ICD-10-CM | POA: Diagnosis not present

## 2016-04-28 DIAGNOSIS — Z299 Encounter for prophylactic measures, unspecified: Secondary | ICD-10-CM | POA: Diagnosis not present

## 2016-04-28 DIAGNOSIS — F319 Bipolar disorder, unspecified: Secondary | ICD-10-CM | POA: Diagnosis not present

## 2016-04-28 DIAGNOSIS — Z6835 Body mass index (BMI) 35.0-35.9, adult: Secondary | ICD-10-CM | POA: Diagnosis not present

## 2016-04-28 DIAGNOSIS — Z713 Dietary counseling and surveillance: Secondary | ICD-10-CM | POA: Diagnosis not present

## 2016-05-28 DIAGNOSIS — I10 Essential (primary) hypertension: Secondary | ICD-10-CM | POA: Diagnosis not present

## 2016-05-28 DIAGNOSIS — Z299 Encounter for prophylactic measures, unspecified: Secondary | ICD-10-CM | POA: Diagnosis not present

## 2016-05-28 DIAGNOSIS — Z789 Other specified health status: Secondary | ICD-10-CM | POA: Diagnosis not present

## 2016-05-28 DIAGNOSIS — Z6835 Body mass index (BMI) 35.0-35.9, adult: Secondary | ICD-10-CM | POA: Diagnosis not present

## 2016-05-28 DIAGNOSIS — E1165 Type 2 diabetes mellitus with hyperglycemia: Secondary | ICD-10-CM | POA: Diagnosis not present

## 2016-05-28 DIAGNOSIS — J069 Acute upper respiratory infection, unspecified: Secondary | ICD-10-CM | POA: Diagnosis not present

## 2016-06-15 DIAGNOSIS — Z789 Other specified health status: Secondary | ICD-10-CM | POA: Diagnosis not present

## 2016-06-15 DIAGNOSIS — I10 Essential (primary) hypertension: Secondary | ICD-10-CM | POA: Diagnosis not present

## 2016-06-15 DIAGNOSIS — Z299 Encounter for prophylactic measures, unspecified: Secondary | ICD-10-CM | POA: Diagnosis not present

## 2016-06-15 DIAGNOSIS — E1165 Type 2 diabetes mellitus with hyperglycemia: Secondary | ICD-10-CM | POA: Diagnosis not present

## 2016-06-15 DIAGNOSIS — R569 Unspecified convulsions: Secondary | ICD-10-CM | POA: Diagnosis not present

## 2016-06-15 DIAGNOSIS — Z713 Dietary counseling and surveillance: Secondary | ICD-10-CM | POA: Diagnosis not present

## 2016-06-15 DIAGNOSIS — F319 Bipolar disorder, unspecified: Secondary | ICD-10-CM | POA: Diagnosis not present

## 2016-06-15 DIAGNOSIS — Z6841 Body Mass Index (BMI) 40.0 and over, adult: Secondary | ICD-10-CM | POA: Diagnosis not present

## 2016-07-01 DIAGNOSIS — F428 Other obsessive-compulsive disorder: Secondary | ICD-10-CM | POA: Diagnosis not present

## 2016-07-18 DIAGNOSIS — R0789 Other chest pain: Secondary | ICD-10-CM | POA: Diagnosis not present

## 2016-07-18 DIAGNOSIS — R079 Chest pain, unspecified: Secondary | ICD-10-CM | POA: Diagnosis not present

## 2016-07-18 DIAGNOSIS — R000 Tachycardia, unspecified: Secondary | ICD-10-CM | POA: Diagnosis not present

## 2016-07-18 DIAGNOSIS — I252 Old myocardial infarction: Secondary | ICD-10-CM | POA: Diagnosis not present

## 2016-07-18 DIAGNOSIS — I10 Essential (primary) hypertension: Secondary | ICD-10-CM | POA: Diagnosis not present

## 2016-07-18 DIAGNOSIS — R1013 Epigastric pain: Secondary | ICD-10-CM | POA: Diagnosis not present

## 2016-07-18 DIAGNOSIS — Z79899 Other long term (current) drug therapy: Secondary | ICD-10-CM | POA: Diagnosis not present

## 2016-07-18 DIAGNOSIS — K219 Gastro-esophageal reflux disease without esophagitis: Secondary | ICD-10-CM | POA: Diagnosis not present

## 2016-07-18 DIAGNOSIS — Z7982 Long term (current) use of aspirin: Secondary | ICD-10-CM | POA: Diagnosis not present

## 2016-07-18 DIAGNOSIS — R110 Nausea: Secondary | ICD-10-CM | POA: Diagnosis not present

## 2016-07-18 DIAGNOSIS — R112 Nausea with vomiting, unspecified: Secondary | ICD-10-CM | POA: Diagnosis not present

## 2016-07-18 DIAGNOSIS — E860 Dehydration: Secondary | ICD-10-CM | POA: Diagnosis not present

## 2016-07-18 DIAGNOSIS — R197 Diarrhea, unspecified: Secondary | ICD-10-CM | POA: Diagnosis not present

## 2016-09-21 DIAGNOSIS — I10 Essential (primary) hypertension: Secondary | ICD-10-CM | POA: Diagnosis not present

## 2016-09-21 DIAGNOSIS — R569 Unspecified convulsions: Secondary | ICD-10-CM | POA: Diagnosis not present

## 2016-09-21 DIAGNOSIS — Z6841 Body Mass Index (BMI) 40.0 and over, adult: Secondary | ICD-10-CM | POA: Diagnosis not present

## 2016-09-21 DIAGNOSIS — F319 Bipolar disorder, unspecified: Secondary | ICD-10-CM | POA: Diagnosis not present

## 2016-09-21 DIAGNOSIS — E1165 Type 2 diabetes mellitus with hyperglycemia: Secondary | ICD-10-CM | POA: Diagnosis not present

## 2016-09-21 DIAGNOSIS — Z299 Encounter for prophylactic measures, unspecified: Secondary | ICD-10-CM | POA: Diagnosis not present

## 2016-09-28 NOTE — Progress Notes (Signed)
Cardiology Office Note  Date: 09/29/2016   ID: ILDEFONSO KEANEY, DOB 10-15-1950, MRN [id number]  PCP: Monico Blitz, MD  Primary Cardiologist: Rozann Lesches, MD   Chief Complaint  Patient presents with  . Coronary Artery Disease    History of Present Illness: Michael Rose is a 66 y.o. male last seen in September 2017. He presents for a routine follow-up visit. Reports no angina symptoms and stable NYHA class II dyspnea. He is having some trouble with the pollen season. Continues to follow with PCP, recent adjustments in diabetes regimen noted.  I reviewed his medications which are outlined below. Current cardiac regimen includes aspirin, Lipitor, Imdur, Toprol-XL, and as needed nitroglycerin. His most recent lab work is being requested from [facility].  Last ischemic testing was in 2014 as outlined below. We have discussed continuing with observation for now.  Past Medical History:  Diagnosis Date  . Allergic reaction to contrast dye   . Bipolar affective disorder ([place])   . Coronary atherosclerosis of native coronary artery    BMS to RCA 2008, [facility] cardiology, Dr. Tamala Julian  . GERD (gastroesophageal reflux disease)   . Hard of hearing    Right ear  . Hyperlipidemia   . Hypertension   . Myocardial infarction ([place])    IMI 2008  . Osteoarthritis   . Ringing in right ear     Past Surgical History:  Procedure Laterality Date  . CHOLECYSTECTOMY  2013  . JOINT REPLACEMENT    . TOTAL KNEE ARTHROPLASTY     Left  . TOTAL KNEE ARTHROPLASTY Right 10/21/2012   Dr French Ana  . TOTAL KNEE ARTHROPLASTY Right 10/21/2012   Procedure: TOTAL KNEE ARTHROPLASTY;  Surgeon: Yvette Rack., MD;  Location: [facility];  Service: Orthopedics;  Laterality: Right;  Marland Kitchen VASECTOMY      Current Outpatient Prescriptions  Medication Sig Dispense Refill  . acetaminophen (TYLENOL) 500 MG tablet Take 500 mg by mouth 2 (two) times daily.    Marland Kitchen aspirin 81 MG tablet Take 81 mg by mouth daily.    Marland Kitchen  atorvastatin (LIPITOR) 10 MG tablet Take 1 tablet (10 mg total) by mouth daily. 90 tablet 3  . dexlansoprazole (DEXILANT) 60 MG capsule Take 60 mg by mouth daily with breakfast.     . empagliflozin (JARDIANCE) 25 MG TABS tablet Take 25 mg by mouth daily.    . fluvoxaMINE (LUVOX) 100 MG tablet Take 100 mg by mouth daily with breakfast.     . glimepiride (AMARYL) 2 MG tablet Take 2 mg by mouth daily with breakfast.    . isosorbide mononitrate (IMDUR) 30 MG 24 hr tablet TAKE 1 TABLET BY MOUTH EVERY DAY 30 tablet 6  . metoprolol succinate (TOPROL-XL) 50 MG 24 hr tablet TAKE ONE TABLET BY MOUTH EVERY MORNING & 1/2 TABLET BY MOUTH EVERY EVENING 135 tablet 3  . mirtazapine (REMERON) 30 MG tablet Take 30 mg by mouth at bedtime.    Marland Kitchen MISC NATURAL PRODUCTS PO Take 1 tablet by mouth every morning. Cholesterol Rx Support    . nitroGLYCERIN (NITROSTAT) 0.4 MG SL tablet Place 1 tablet (0.4 mg total) under the tongue every 5 (five) minutes as needed for chest pain. 25 tablet 3  . traZODone (DESYREL) 50 MG tablet Take 50 mg by mouth at bedtime.     No current facility-administered medications for this visit.    Allergies:  Iodinated diagnostic agents and Penicillins   Social History: The patient  reports that he  has never smoked. He has never used smokeless tobacco. He reports that he does not drink alcohol or use drugs.   ROS:  Please see the history of present illness. Otherwise, complete review of systems is positive for hearing loss.  All other systems are reviewed and negative.   Physical Exam: VS:  BP 130/90   Pulse 89   Ht 5\' 11"  (1.803 m)   Wt 240 lb (108.9 kg)   SpO2 98%   BMI 33.47 kg/m , BMI Body mass index is 33.47 kg/m.  Wt Readings from Last 3 Encounters:  09/29/16 240 lb (108.9 kg)  02/18/16 243 lb 6.4 oz (110.4 kg)  08/09/15 245 lb 9.6 oz (111.4 kg)    Overweight male in no acute distress.  HEENT: Conjunctiva and lids normal, oropharynx with poor dentition.  Neck: Supple, no  elevated JVP or bruits.  Lungs: Clear to auscultation, diminished breath sounds, nonlabored.  Cardiac: Regular rate aythm, soft S4, no significant murmur.  Abdomen: Nontender, bowel sounds present.  Skin: Warm and dry.  Extremities: No edema, distal pulses one plus.  Neuropsychiatric: Alert and oriented 3, affect appropriate.   ECG: I personally reviewed the tracing from 02/18/2016 which showed normal sinus rhythm with nonspecific T-wave changes.  Other Studies Reviewed Today:  Lexiscan Cardiolite April 2014: Small inferolateral defect consistent with scar/minor peri-infarct ischemia; EF 52%.   Echocardiogram [facility]) December 2014: Normal LVEF of 69%, diastolic dysfunction, no major valvular abnormalities.  Assessment and Plan:  1. Symptomatically stable CAD status post BMS to the RCA in 2008. Ischemic testing from 2014 was overall low risk. He does not report any significant angina on medical therapy and we will continue with observation for now.  2. Essential hypertension, systolic blood pressure in the 130s. Continue Toprol-XL.  3. Hyperlipidemia, on Lipitor. Requesting most recent lab work from PCP.  4. Type 2 diabetes mellitus, on Jardiance and Amaryl. Patient states undergoing medication adjustments with suboptimal glucose control. Has follow-up with PCP within the month.  Current medicines were reviewed with the patient today.  Disposition: Follow-up in 6 months.  Signed, Satira Sark, MD, [facility] 09/29/2016 9:52 AM    [place] at [place], [place], [place] [address] Phone: [phone]; Fax: [phone]

## 2016-09-29 ENCOUNTER — Encounter: Payer: Self-pay | Admitting: Cardiology

## 2016-09-29 ENCOUNTER — Ambulatory Visit (INDEPENDENT_AMBULATORY_CARE_PROVIDER_SITE_OTHER): Payer: Medicare Other | Admitting: Cardiology

## 2016-09-29 VITALS — BP 130/90 | HR 89 | Ht 71.0 in | Wt 240.0 lb

## 2016-09-29 DIAGNOSIS — I2510 Atherosclerotic heart disease of native coronary artery without angina pectoris: Secondary | ICD-10-CM | POA: Diagnosis not present

## 2016-09-29 DIAGNOSIS — I10 Essential (primary) hypertension: Secondary | ICD-10-CM | POA: Diagnosis not present

## 2016-09-29 DIAGNOSIS — E782 Mixed hyperlipidemia: Secondary | ICD-10-CM

## 2016-09-29 DIAGNOSIS — E118 Type 2 diabetes mellitus with unspecified complications: Secondary | ICD-10-CM

## 2016-09-29 NOTE — Patient Instructions (Signed)
Medication Instructions:  Your physician recommends that you continue on your current medications as directed. Please refer to the Current Medication list given to you today.  Labwork: WILL REQUEST LABS FROM PRIMARY DOCTOR  Testing/Procedures: NONE  Follow-Up: Your physician wants you to follow-up in: [place]. [place]. You will receive a reminder letter in the mail two months in advance. If you don't receive a letter, please call our office to schedule the follow-up appointment.  Any Other Special Instructions Will Be Listed Below (If Applicable).  If you need a refill on your cardiac medications before your next appointment, please call your pharmacy.

## 2016-10-19 DIAGNOSIS — F428 Other obsessive-compulsive disorder: Secondary | ICD-10-CM | POA: Diagnosis not present

## 2016-10-19 DIAGNOSIS — R569 Unspecified convulsions: Secondary | ICD-10-CM | POA: Diagnosis not present

## 2016-10-19 DIAGNOSIS — I2510 Atherosclerotic heart disease of native coronary artery without angina pectoris: Secondary | ICD-10-CM | POA: Diagnosis not present

## 2016-10-19 DIAGNOSIS — F411 Generalized anxiety disorder: Secondary | ICD-10-CM | POA: Diagnosis not present

## 2016-10-19 DIAGNOSIS — F329 Major depressive disorder, single episode, unspecified: Secondary | ICD-10-CM | POA: Diagnosis not present

## 2016-10-19 DIAGNOSIS — Z299 Encounter for prophylactic measures, unspecified: Secondary | ICD-10-CM | POA: Diagnosis not present

## 2016-10-19 DIAGNOSIS — F319 Bipolar disorder, unspecified: Secondary | ICD-10-CM | POA: Diagnosis not present

## 2016-10-19 DIAGNOSIS — I10 Essential (primary) hypertension: Secondary | ICD-10-CM | POA: Diagnosis not present

## 2016-10-19 DIAGNOSIS — E1165 Type 2 diabetes mellitus with hyperglycemia: Secondary | ICD-10-CM | POA: Diagnosis not present

## 2016-10-19 DIAGNOSIS — E7800 Pure hypercholesterolemia, unspecified: Secondary | ICD-10-CM | POA: Diagnosis not present

## 2016-10-19 DIAGNOSIS — Z6841 Body Mass Index (BMI) 40.0 and over, adult: Secondary | ICD-10-CM | POA: Diagnosis not present

## 2016-11-25 ENCOUNTER — Other Ambulatory Visit: Payer: Self-pay | Admitting: *Deleted

## 2016-11-25 MED ORDER — METOPROLOL SUCCINATE ER 50 MG PO TB24: ORAL_TABLET | ORAL | 1 refills | Status: DC

## 2016-12-29 DIAGNOSIS — I2510 Atherosclerotic heart disease of native coronary artery without angina pectoris: Secondary | ICD-10-CM | POA: Diagnosis not present

## 2016-12-29 DIAGNOSIS — Z6841 Body Mass Index (BMI) 40.0 and over, adult: Secondary | ICD-10-CM | POA: Diagnosis not present

## 2016-12-29 DIAGNOSIS — F329 Major depressive disorder, single episode, unspecified: Secondary | ICD-10-CM | POA: Diagnosis not present

## 2016-12-29 DIAGNOSIS — I10 Essential (primary) hypertension: Secondary | ICD-10-CM | POA: Diagnosis not present

## 2016-12-29 DIAGNOSIS — R569 Unspecified convulsions: Secondary | ICD-10-CM | POA: Diagnosis not present

## 2016-12-29 DIAGNOSIS — F411 Generalized anxiety disorder: Secondary | ICD-10-CM | POA: Diagnosis not present

## 2016-12-29 DIAGNOSIS — E1165 Type 2 diabetes mellitus with hyperglycemia: Secondary | ICD-10-CM | POA: Diagnosis not present

## 2016-12-29 DIAGNOSIS — Z299 Encounter for prophylactic measures, unspecified: Secondary | ICD-10-CM | POA: Diagnosis not present

## 2016-12-29 DIAGNOSIS — E7800 Pure hypercholesterolemia, unspecified: Secondary | ICD-10-CM | POA: Diagnosis not present

## 2016-12-29 DIAGNOSIS — F319 Bipolar disorder, unspecified: Secondary | ICD-10-CM | POA: Diagnosis not present

## 2017-02-25 DIAGNOSIS — Z23 Encounter for immunization: Secondary | ICD-10-CM | POA: Diagnosis not present

## 2017-04-01 ENCOUNTER — Ambulatory Visit (INDEPENDENT_AMBULATORY_CARE_PROVIDER_SITE_OTHER): Payer: Medicare Other | Admitting: Cardiology

## 2017-04-01 ENCOUNTER — Encounter: Payer: Self-pay | Admitting: Cardiology

## 2017-04-01 VITALS — BP 132/72 | HR 87 | Ht 71.0 in | Wt 241.2 lb

## 2017-04-01 DIAGNOSIS — I209 Angina pectoris, unspecified: Secondary | ICD-10-CM

## 2017-04-01 DIAGNOSIS — I25119 Atherosclerotic heart disease of native coronary artery with unspecified angina pectoris: Secondary | ICD-10-CM

## 2017-04-01 DIAGNOSIS — E118 Type 2 diabetes mellitus with unspecified complications: Secondary | ICD-10-CM

## 2017-04-01 DIAGNOSIS — E782 Mixed hyperlipidemia: Secondary | ICD-10-CM

## 2017-04-01 DIAGNOSIS — I10 Essential (primary) hypertension: Secondary | ICD-10-CM

## 2017-04-01 NOTE — Patient Instructions (Signed)

## 2017-04-01 NOTE — Progress Notes (Signed)
Cardiology Office Note  Date: 04/01/2017   ID: LOGON UTTECH, DOB 12-Mar-1951, MRN [id number]  PCP: Monico Blitz, MD  Primary Cardiologist: Rozann Lesches, MD   Chief Complaint  Patient presents with  . Coronary Artery Disease    History of Present Illness: Michael Rose is a 66 y.o. male last seen in May.  He presents for a routine follow-up visit.  He reports no angina symptoms or increasing nitroglycerin requirement.  He has stable NYHA class II dyspnea.  No palpitations or syncope.  I reviewed his current medications.  Cardiac regimen includes aspirin, Lipitor, Imdur, Toprol-XL, and as needed nitroglycerin.  Last ischemic testing was in 2014 as outlined below.  Follow-up ECG today is normal.  He remains comfortable with observation in the absence of recurring angina symptoms.  Past Medical History:  Diagnosis Date  . Allergic reaction to contrast dye   . Bipolar affective disorder ([place])   . Coronary atherosclerosis of native coronary artery    BMS to RCA 2008, [facility] cardiology, Dr. Tamala Julian  . GERD (gastroesophageal reflux disease)   . Hard of hearing    Right ear  . Hyperlipidemia   . Hypertension   . Myocardial infarction ([place])    IMI 2008  . Osteoarthritis   . Ringing in right ear     Past Surgical History:  Procedure Laterality Date  . CHOLECYSTECTOMY  2013  . JOINT REPLACEMENT    . TOTAL KNEE ARTHROPLASTY     Left  . TOTAL KNEE ARTHROPLASTY Right 10/21/2012   Dr French Ana  . TOTAL KNEE ARTHROPLASTY Right 10/21/2012   Procedure: TOTAL KNEE ARTHROPLASTY;  Surgeon: Yvette Rack., MD;  Location: [facility];  Service: Orthopedics;  Laterality: Right;  Marland Kitchen VASECTOMY      Current Outpatient Prescriptions  Medication Sig Dispense Refill  . acetaminophen (TYLENOL) 325 MG tablet Take 650 mg by mouth as needed.    Marland Kitchen aspirin 81 MG tablet Take 81 mg by mouth daily.    Marland Kitchen atorvastatin (LIPITOR) 20 MG tablet Take 10 mg by mouth daily.    Marland Kitchen dexlansoprazole (DEXILANT) 60 MG  capsule Take 60 mg by mouth daily with breakfast.     . empagliflozin (JARDIANCE) 25 MG TABS tablet Take 25 mg by mouth daily.    . fluvoxaMINE (LUVOX) 100 MG tablet Take 100 mg by mouth daily with breakfast.     . glimepiride (AMARYL) 2 MG tablet Take 2 mg by mouth daily with breakfast.    . isosorbide mononitrate (IMDUR) 30 MG 24 hr tablet TAKE 1 TABLET BY MOUTH EVERY DAY 30 tablet 6  . loratadine (CLARITIN) 10 MG tablet Take 10 mg by mouth daily.    . metoprolol succinate (TOPROL-XL) 50 MG 24 hr tablet TAKE ONE TABLET BY MOUTH EVERY MORNING & 1/2 TABLET BY MOUTH EVERY EVENING 135 tablet 1  . mirtazapine (REMERON) 30 MG tablet Take 30 mg by mouth at bedtime.    . nitroGLYCERIN (NITROSTAT) 0.4 MG SL tablet Place 1 tablet (0.4 mg total) under the tongue every 5 (five) minutes as needed for chest pain. 25 tablet 3  . traZODone (DESYREL) 50 MG tablet Take 50 mg by mouth at bedtime.     No current facility-administered medications for this visit.    Allergies:  Iodinated diagnostic agents and Penicillins   Social History: The patient  reports that he has never smoked. He has never used smokeless tobacco. He reports that he does not drink alcohol or use  drugs.   ROS:  Please see the history of present illness. Otherwise, complete review of systems is positive for hearing loss.  All other systems are reviewed and negative.   Physical Exam: VS:  BP 132/72   Pulse 87   Ht 5\' 11"  (1.803 m)   Wt 241 lb 3.2 oz (109.4 kg)   SpO2 95%   BMI 33.64 kg/m , BMI Body mass index is 33.64 kg/m.  Wt Readings from Last 3 Encounters:  04/01/17 241 lb 3.2 oz (109.4 kg)  09/29/16 240 lb (108.9 kg)  02/18/16 243 lb 6.4 oz (110.4 kg)    General: Obese male, appears comfortable at rest. HEENT: Conjunctiva and lids normal, oropharynx clear. Neck: Supple, no elevated JVP or carotid bruits, no thyromegaly. Lungs: Clear to auscultation, nonlabored breathing at rest. Cardiac: Regular rate and rhythm, S4, no  significant systolic murmur, no pericardial rub. Abdomen: Obese, nontender, bowel sounds present, no guarding or rebound. Extremities: No pitting edema, distal pulses 2+. Skin: Warm and dry. Musculoskeletal: No kyphosis. Neuropsychiatric: Alert and oriented x3, affect grossly appropriate.  ECG: I personally reviewed the tracing from 02/18/2016 which showed sinus rhythm with nonspecific T wave changes.  Recent Labwork:  April 2018: Hemoglobin A1c 8.5  Other Studies Reviewed Today:  Lexiscan Cardiolite April 2014: Small inferolateral defect consistent with scar/minor peri-infarct ischemia; EF 52%.   Echocardiogram [facility]) December 2014: Normal LVEF of 14%, diastolic dysfunction, no major valvular abnormalities.  Assessment and Plan:  1.  CAD status post BMS to the RCA in 2000.  He remains stable without angina symptoms on medical therapy and we will continue with observation.  ECG today is normal.  2.  Essential hypertension, blood pressure is reasonably well controlled today.  3.  Type 2 diabetes mellitus, on Jardiance and Amaryl.  Hemoglobin A1c 8.5 in April.  He is due for follow-up with PCP next week.  4.  Hyperlipidemia, continues on Lipitor.  Current medicines were reviewed with the patient today.   Orders Placed This Encounter  Procedures  . EKG 12-Lead    Disposition: Follow-up in 6 months.  Signed, Satira Sark, MD, [facility] 04/01/2017 1:22 PM    [facility], [place], [place] [address] Phone: [phone]; Fax: [phone]

## 2017-04-06 DIAGNOSIS — F319 Bipolar disorder, unspecified: Secondary | ICD-10-CM | POA: Diagnosis not present

## 2017-04-06 DIAGNOSIS — Z299 Encounter for prophylactic measures, unspecified: Secondary | ICD-10-CM | POA: Diagnosis not present

## 2017-04-06 DIAGNOSIS — R569 Unspecified convulsions: Secondary | ICD-10-CM | POA: Diagnosis not present

## 2017-04-06 DIAGNOSIS — E1165 Type 2 diabetes mellitus with hyperglycemia: Secondary | ICD-10-CM | POA: Diagnosis not present

## 2017-04-06 DIAGNOSIS — Z6841 Body Mass Index (BMI) 40.0 and over, adult: Secondary | ICD-10-CM | POA: Diagnosis not present

## 2017-05-05 DIAGNOSIS — F329 Major depressive disorder, single episode, unspecified: Secondary | ICD-10-CM | POA: Diagnosis not present

## 2017-05-05 DIAGNOSIS — I2510 Atherosclerotic heart disease of native coronary artery without angina pectoris: Secondary | ICD-10-CM | POA: Diagnosis not present

## 2017-05-05 DIAGNOSIS — I10 Essential (primary) hypertension: Secondary | ICD-10-CM | POA: Diagnosis not present

## 2017-05-05 DIAGNOSIS — E119 Type 2 diabetes mellitus without complications: Secondary | ICD-10-CM | POA: Diagnosis not present

## 2017-05-10 ENCOUNTER — Other Ambulatory Visit: Payer: Self-pay | Admitting: Cardiology

## 2017-06-04 DIAGNOSIS — E119 Type 2 diabetes mellitus without complications: Secondary | ICD-10-CM | POA: Diagnosis not present

## 2017-06-04 DIAGNOSIS — I10 Essential (primary) hypertension: Secondary | ICD-10-CM | POA: Diagnosis not present

## 2017-06-04 DIAGNOSIS — F329 Major depressive disorder, single episode, unspecified: Secondary | ICD-10-CM | POA: Diagnosis not present

## 2017-06-04 DIAGNOSIS — I2510 Atherosclerotic heart disease of native coronary artery without angina pectoris: Secondary | ICD-10-CM | POA: Diagnosis not present

## 2017-06-18 DIAGNOSIS — F428 Other obsessive-compulsive disorder: Secondary | ICD-10-CM | POA: Diagnosis not present

## 2017-07-15 DIAGNOSIS — I2510 Atherosclerotic heart disease of native coronary artery without angina pectoris: Secondary | ICD-10-CM | POA: Diagnosis not present

## 2017-07-15 DIAGNOSIS — F329 Major depressive disorder, single episode, unspecified: Secondary | ICD-10-CM | POA: Diagnosis not present

## 2017-07-15 DIAGNOSIS — I10 Essential (primary) hypertension: Secondary | ICD-10-CM | POA: Diagnosis not present

## 2017-07-15 DIAGNOSIS — E119 Type 2 diabetes mellitus without complications: Secondary | ICD-10-CM | POA: Diagnosis not present

## 2017-08-13 DIAGNOSIS — Z6841 Body Mass Index (BMI) 40.0 and over, adult: Secondary | ICD-10-CM | POA: Diagnosis not present

## 2017-08-13 DIAGNOSIS — I10 Essential (primary) hypertension: Secondary | ICD-10-CM | POA: Diagnosis not present

## 2017-08-13 DIAGNOSIS — Z299 Encounter for prophylactic measures, unspecified: Secondary | ICD-10-CM | POA: Diagnosis not present

## 2017-08-13 DIAGNOSIS — R569 Unspecified convulsions: Secondary | ICD-10-CM | POA: Diagnosis not present

## 2017-08-13 DIAGNOSIS — E1165 Type 2 diabetes mellitus with hyperglycemia: Secondary | ICD-10-CM | POA: Diagnosis not present

## 2017-08-13 DIAGNOSIS — F319 Bipolar disorder, unspecified: Secondary | ICD-10-CM | POA: Diagnosis not present

## 2017-08-25 DIAGNOSIS — E119 Type 2 diabetes mellitus without complications: Secondary | ICD-10-CM | POA: Diagnosis not present

## 2017-11-19 DIAGNOSIS — Z6841 Body Mass Index (BMI) 40.0 and over, adult: Secondary | ICD-10-CM | POA: Diagnosis not present

## 2017-11-19 DIAGNOSIS — Z299 Encounter for prophylactic measures, unspecified: Secondary | ICD-10-CM | POA: Diagnosis not present

## 2017-11-19 DIAGNOSIS — I10 Essential (primary) hypertension: Secondary | ICD-10-CM | POA: Diagnosis not present

## 2017-11-19 DIAGNOSIS — F319 Bipolar disorder, unspecified: Secondary | ICD-10-CM | POA: Diagnosis not present

## 2017-11-19 DIAGNOSIS — E1165 Type 2 diabetes mellitus with hyperglycemia: Secondary | ICD-10-CM | POA: Diagnosis not present

## 2017-11-19 DIAGNOSIS — R569 Unspecified convulsions: Secondary | ICD-10-CM | POA: Diagnosis not present

## 2017-11-26 DIAGNOSIS — I10 Essential (primary) hypertension: Secondary | ICD-10-CM | POA: Diagnosis not present

## 2017-11-26 DIAGNOSIS — F329 Major depressive disorder, single episode, unspecified: Secondary | ICD-10-CM | POA: Diagnosis not present

## 2017-11-26 DIAGNOSIS — E119 Type 2 diabetes mellitus without complications: Secondary | ICD-10-CM | POA: Diagnosis not present

## 2017-11-26 DIAGNOSIS — I2510 Atherosclerotic heart disease of native coronary artery without angina pectoris: Secondary | ICD-10-CM | POA: Diagnosis not present

## 2018-02-15 DIAGNOSIS — Z789 Other specified health status: Secondary | ICD-10-CM | POA: Diagnosis not present

## 2018-02-15 DIAGNOSIS — Z6841 Body Mass Index (BMI) 40.0 and over, adult: Secondary | ICD-10-CM | POA: Diagnosis not present

## 2018-02-15 DIAGNOSIS — J069 Acute upper respiratory infection, unspecified: Secondary | ICD-10-CM | POA: Diagnosis not present

## 2018-02-15 DIAGNOSIS — M549 Dorsalgia, unspecified: Secondary | ICD-10-CM | POA: Diagnosis not present

## 2018-02-15 DIAGNOSIS — Z299 Encounter for prophylactic measures, unspecified: Secondary | ICD-10-CM | POA: Diagnosis not present

## 2018-02-15 DIAGNOSIS — I10 Essential (primary) hypertension: Secondary | ICD-10-CM | POA: Diagnosis not present

## 2018-02-15 DIAGNOSIS — Z23 Encounter for immunization: Secondary | ICD-10-CM | POA: Diagnosis not present

## 2018-02-20 ENCOUNTER — Other Ambulatory Visit: Payer: Self-pay | Admitting: Cardiology

## 2018-02-24 DIAGNOSIS — M9903 Segmental and somatic dysfunction of lumbar region: Secondary | ICD-10-CM | POA: Diagnosis not present

## 2018-02-24 DIAGNOSIS — M9901 Segmental and somatic dysfunction of cervical region: Secondary | ICD-10-CM | POA: Diagnosis not present

## 2018-02-24 DIAGNOSIS — S233XXA Sprain of ligaments of thoracic spine, initial encounter: Secondary | ICD-10-CM | POA: Diagnosis not present

## 2018-02-24 DIAGNOSIS — S134XXA Sprain of ligaments of cervical spine, initial encounter: Secondary | ICD-10-CM | POA: Diagnosis not present

## 2018-02-24 DIAGNOSIS — M9902 Segmental and somatic dysfunction of thoracic region: Secondary | ICD-10-CM | POA: Diagnosis not present

## 2018-02-24 DIAGNOSIS — S338XXA Sprain of other parts of lumbar spine and pelvis, initial encounter: Secondary | ICD-10-CM | POA: Diagnosis not present

## 2018-02-25 DIAGNOSIS — S233XXA Sprain of ligaments of thoracic spine, initial encounter: Secondary | ICD-10-CM | POA: Diagnosis not present

## 2018-02-25 DIAGNOSIS — M9901 Segmental and somatic dysfunction of cervical region: Secondary | ICD-10-CM | POA: Diagnosis not present

## 2018-02-25 DIAGNOSIS — S338XXA Sprain of other parts of lumbar spine and pelvis, initial encounter: Secondary | ICD-10-CM | POA: Diagnosis not present

## 2018-02-25 DIAGNOSIS — M9903 Segmental and somatic dysfunction of lumbar region: Secondary | ICD-10-CM | POA: Diagnosis not present

## 2018-02-25 DIAGNOSIS — M9902 Segmental and somatic dysfunction of thoracic region: Secondary | ICD-10-CM | POA: Diagnosis not present

## 2018-02-25 DIAGNOSIS — S134XXA Sprain of ligaments of cervical spine, initial encounter: Secondary | ICD-10-CM | POA: Diagnosis not present

## 2018-02-28 DIAGNOSIS — M9902 Segmental and somatic dysfunction of thoracic region: Secondary | ICD-10-CM | POA: Diagnosis not present

## 2018-02-28 DIAGNOSIS — M9901 Segmental and somatic dysfunction of cervical region: Secondary | ICD-10-CM | POA: Diagnosis not present

## 2018-02-28 DIAGNOSIS — S338XXA Sprain of other parts of lumbar spine and pelvis, initial encounter: Secondary | ICD-10-CM | POA: Diagnosis not present

## 2018-02-28 DIAGNOSIS — S134XXA Sprain of ligaments of cervical spine, initial encounter: Secondary | ICD-10-CM | POA: Diagnosis not present

## 2018-02-28 DIAGNOSIS — M9903 Segmental and somatic dysfunction of lumbar region: Secondary | ICD-10-CM | POA: Diagnosis not present

## 2018-02-28 DIAGNOSIS — S233XXA Sprain of ligaments of thoracic spine, initial encounter: Secondary | ICD-10-CM | POA: Diagnosis not present

## 2018-03-24 DIAGNOSIS — E119 Type 2 diabetes mellitus without complications: Secondary | ICD-10-CM | POA: Diagnosis not present

## 2018-03-24 DIAGNOSIS — E1165 Type 2 diabetes mellitus with hyperglycemia: Secondary | ICD-10-CM | POA: Diagnosis not present

## 2018-03-24 DIAGNOSIS — H25813 Combined forms of age-related cataract, bilateral: Secondary | ICD-10-CM | POA: Diagnosis not present

## 2018-03-24 DIAGNOSIS — Z7984 Long term (current) use of oral hypoglycemic drugs: Secondary | ICD-10-CM | POA: Diagnosis not present

## 2018-04-07 DIAGNOSIS — H2513 Age-related nuclear cataract, bilateral: Secondary | ICD-10-CM | POA: Diagnosis not present

## 2018-04-07 DIAGNOSIS — H35352 Cystoid macular degeneration, left eye: Secondary | ICD-10-CM | POA: Diagnosis not present

## 2018-04-15 DIAGNOSIS — H2512 Age-related nuclear cataract, left eye: Secondary | ICD-10-CM | POA: Diagnosis not present

## 2018-04-15 DIAGNOSIS — Z01818 Encounter for other preprocedural examination: Secondary | ICD-10-CM | POA: Diagnosis not present

## 2018-04-15 DIAGNOSIS — H25812 Combined forms of age-related cataract, left eye: Secondary | ICD-10-CM | POA: Diagnosis not present

## 2018-05-30 DIAGNOSIS — I2510 Atherosclerotic heart disease of native coronary artery without angina pectoris: Secondary | ICD-10-CM | POA: Diagnosis not present

## 2018-05-30 DIAGNOSIS — F329 Major depressive disorder, single episode, unspecified: Secondary | ICD-10-CM | POA: Diagnosis not present

## 2018-05-30 DIAGNOSIS — E119 Type 2 diabetes mellitus without complications: Secondary | ICD-10-CM | POA: Diagnosis not present

## 2018-05-30 DIAGNOSIS — I10 Essential (primary) hypertension: Secondary | ICD-10-CM | POA: Diagnosis not present

## 2018-06-09 ENCOUNTER — Other Ambulatory Visit: Payer: Self-pay | Admitting: Cardiology

## 2018-06-15 ENCOUNTER — Telehealth: Payer: Self-pay | Admitting: Cardiology

## 2018-06-15 NOTE — Telephone Encounter (Signed)
Numerous attempts to contact patient with recall letters. Unable to reach by telephone. with no success.   User: Time: Status:    Chanda Busing [[id number]] 04/01/2017 2:50 PM New [10]   [System] 06/01/2017 11:04 PM Notification Sent [20]   Chanda Busing [[id number]] 03/16/2018 8:30 AM Notification Sent [20]   Chanda Busing [[id number]] 04/25/2018 1:01 PM Notification Sent [20]   Chanda Busing [[id number]] 06/15/2018 12:55 PM Notification Sent [20]

## 2018-10-20 ENCOUNTER — Other Ambulatory Visit: Payer: Self-pay | Admitting: Cardiology

## 2019-03-06 ENCOUNTER — Other Ambulatory Visit: Payer: Self-pay | Admitting: Cardiology

## 2019-06-21 DIAGNOSIS — F428 Other obsessive-compulsive disorder: Secondary | ICD-10-CM | POA: Diagnosis not present

## 2019-06-23 DIAGNOSIS — Z299 Encounter for prophylactic measures, unspecified: Secondary | ICD-10-CM | POA: Diagnosis not present

## 2019-06-23 DIAGNOSIS — F319 Bipolar disorder, unspecified: Secondary | ICD-10-CM | POA: Diagnosis not present

## 2019-06-23 DIAGNOSIS — Z6833 Body mass index (BMI) 33.0-33.9, adult: Secondary | ICD-10-CM | POA: Diagnosis not present

## 2019-06-23 DIAGNOSIS — E1165 Type 2 diabetes mellitus with hyperglycemia: Secondary | ICD-10-CM | POA: Diagnosis not present

## 2019-06-23 DIAGNOSIS — I10 Essential (primary) hypertension: Secondary | ICD-10-CM | POA: Diagnosis not present

## 2019-06-27 DIAGNOSIS — E119 Type 2 diabetes mellitus without complications: Secondary | ICD-10-CM | POA: Diagnosis not present

## 2019-07-30 DIAGNOSIS — E119 Type 2 diabetes mellitus without complications: Secondary | ICD-10-CM | POA: Diagnosis not present

## 2019-08-23 DIAGNOSIS — K5900 Constipation, unspecified: Secondary | ICD-10-CM | POA: Diagnosis not present

## 2019-08-23 DIAGNOSIS — R5383 Other fatigue: Secondary | ICD-10-CM | POA: Diagnosis not present

## 2019-08-23 DIAGNOSIS — Z79899 Other long term (current) drug therapy: Secondary | ICD-10-CM | POA: Diagnosis not present

## 2019-08-23 DIAGNOSIS — Z1339 Encounter for screening examination for other mental health and behavioral disorders: Secondary | ICD-10-CM | POA: Diagnosis not present

## 2019-08-23 DIAGNOSIS — Z0000 Encounter for general adult medical examination without abnormal findings: Secondary | ICD-10-CM | POA: Diagnosis not present

## 2019-08-23 DIAGNOSIS — Z1331 Encounter for screening for depression: Secondary | ICD-10-CM | POA: Diagnosis not present

## 2019-08-23 DIAGNOSIS — Z6833 Body mass index (BMI) 33.0-33.9, adult: Secondary | ICD-10-CM | POA: Diagnosis not present

## 2019-08-23 DIAGNOSIS — Z1389 Encounter for screening for other disorder: Secondary | ICD-10-CM | POA: Diagnosis not present

## 2019-08-23 DIAGNOSIS — E1165 Type 2 diabetes mellitus with hyperglycemia: Secondary | ICD-10-CM | POA: Diagnosis not present

## 2019-08-23 DIAGNOSIS — Z125 Encounter for screening for malignant neoplasm of prostate: Secondary | ICD-10-CM | POA: Diagnosis not present

## 2019-08-23 DIAGNOSIS — E7800 Pure hypercholesterolemia, unspecified: Secondary | ICD-10-CM | POA: Diagnosis not present

## 2019-08-23 DIAGNOSIS — Z6831 Body mass index (BMI) 31.0-31.9, adult: Secondary | ICD-10-CM | POA: Diagnosis not present

## 2019-08-23 DIAGNOSIS — Z7189 Other specified counseling: Secondary | ICD-10-CM | POA: Diagnosis not present

## 2019-08-29 DIAGNOSIS — E119 Type 2 diabetes mellitus without complications: Secondary | ICD-10-CM | POA: Diagnosis not present

## 2019-09-29 DIAGNOSIS — E119 Type 2 diabetes mellitus without complications: Secondary | ICD-10-CM | POA: Diagnosis not present

## 2019-10-09 DIAGNOSIS — Z1211 Encounter for screening for malignant neoplasm of colon: Secondary | ICD-10-CM | POA: Diagnosis not present

## 2019-10-29 DIAGNOSIS — E119 Type 2 diabetes mellitus without complications: Secondary | ICD-10-CM | POA: Diagnosis not present

## 2019-11-07 DIAGNOSIS — Z01818 Encounter for other preprocedural examination: Secondary | ICD-10-CM | POA: Diagnosis not present

## 2019-11-09 DIAGNOSIS — Z7984 Long term (current) use of oral hypoglycemic drugs: Secondary | ICD-10-CM | POA: Diagnosis not present

## 2019-11-09 DIAGNOSIS — Z7982 Long term (current) use of aspirin: Secondary | ICD-10-CM | POA: Diagnosis not present

## 2019-11-09 DIAGNOSIS — E119 Type 2 diabetes mellitus without complications: Secondary | ICD-10-CM | POA: Diagnosis not present

## 2019-11-09 DIAGNOSIS — K641 Second degree hemorrhoids: Secondary | ICD-10-CM | POA: Diagnosis not present

## 2019-11-09 DIAGNOSIS — K621 Rectal polyp: Secondary | ICD-10-CM | POA: Diagnosis not present

## 2019-11-09 DIAGNOSIS — I10 Essential (primary) hypertension: Secondary | ICD-10-CM | POA: Diagnosis not present

## 2019-11-09 DIAGNOSIS — K635 Polyp of colon: Secondary | ICD-10-CM | POA: Diagnosis not present

## 2019-11-09 DIAGNOSIS — Z955 Presence of coronary angioplasty implant and graft: Secondary | ICD-10-CM | POA: Diagnosis not present

## 2019-11-09 DIAGNOSIS — D126 Benign neoplasm of colon, unspecified: Secondary | ICD-10-CM | POA: Diagnosis not present

## 2019-11-09 DIAGNOSIS — K648 Other hemorrhoids: Secondary | ICD-10-CM | POA: Diagnosis not present

## 2019-11-09 DIAGNOSIS — I2510 Atherosclerotic heart disease of native coronary artery without angina pectoris: Secondary | ICD-10-CM | POA: Diagnosis not present

## 2019-11-09 DIAGNOSIS — Z1211 Encounter for screening for malignant neoplasm of colon: Secondary | ICD-10-CM | POA: Diagnosis not present

## 2019-11-29 DIAGNOSIS — E119 Type 2 diabetes mellitus without complications: Secondary | ICD-10-CM | POA: Diagnosis not present

## 2019-12-06 DIAGNOSIS — F428 Other obsessive-compulsive disorder: Secondary | ICD-10-CM | POA: Diagnosis not present

## 2019-12-08 DIAGNOSIS — Z6833 Body mass index (BMI) 33.0-33.9, adult: Secondary | ICD-10-CM | POA: Diagnosis not present

## 2019-12-08 DIAGNOSIS — I2510 Atherosclerotic heart disease of native coronary artery without angina pectoris: Secondary | ICD-10-CM | POA: Diagnosis not present

## 2019-12-08 DIAGNOSIS — I10 Essential (primary) hypertension: Secondary | ICD-10-CM | POA: Diagnosis not present

## 2019-12-08 DIAGNOSIS — F319 Bipolar disorder, unspecified: Secondary | ICD-10-CM | POA: Diagnosis not present

## 2019-12-08 DIAGNOSIS — E1165 Type 2 diabetes mellitus with hyperglycemia: Secondary | ICD-10-CM | POA: Diagnosis not present

## 2019-12-08 DIAGNOSIS — Z299 Encounter for prophylactic measures, unspecified: Secondary | ICD-10-CM | POA: Diagnosis not present

## 2019-12-27 DIAGNOSIS — Z1211 Encounter for screening for malignant neoplasm of colon: Secondary | ICD-10-CM | POA: Diagnosis not present

## 2019-12-29 DIAGNOSIS — E119 Type 2 diabetes mellitus without complications: Secondary | ICD-10-CM | POA: Diagnosis not present

## 2020-01-03 DIAGNOSIS — F428 Other obsessive-compulsive disorder: Secondary | ICD-10-CM | POA: Diagnosis not present

## 2020-01-09 DIAGNOSIS — Z6833 Body mass index (BMI) 33.0-33.9, adult: Secondary | ICD-10-CM | POA: Diagnosis not present

## 2020-01-09 DIAGNOSIS — Z299 Encounter for prophylactic measures, unspecified: Secondary | ICD-10-CM | POA: Diagnosis not present

## 2020-01-09 DIAGNOSIS — E1165 Type 2 diabetes mellitus with hyperglycemia: Secondary | ICD-10-CM | POA: Diagnosis not present

## 2020-01-09 DIAGNOSIS — F319 Bipolar disorder, unspecified: Secondary | ICD-10-CM | POA: Diagnosis not present

## 2020-01-09 DIAGNOSIS — I10 Essential (primary) hypertension: Secondary | ICD-10-CM | POA: Diagnosis not present

## 2020-01-09 DIAGNOSIS — I2510 Atherosclerotic heart disease of native coronary artery without angina pectoris: Secondary | ICD-10-CM | POA: Diagnosis not present

## 2020-01-30 DIAGNOSIS — I10 Essential (primary) hypertension: Secondary | ICD-10-CM | POA: Diagnosis not present

## 2020-01-30 DIAGNOSIS — E7849 Other hyperlipidemia: Secondary | ICD-10-CM | POA: Diagnosis not present

## 2020-01-30 DIAGNOSIS — E119 Type 2 diabetes mellitus without complications: Secondary | ICD-10-CM | POA: Diagnosis not present

## 2020-01-30 DIAGNOSIS — E1165 Type 2 diabetes mellitus with hyperglycemia: Secondary | ICD-10-CM | POA: Diagnosis not present

## 2020-01-30 DIAGNOSIS — K219 Gastro-esophageal reflux disease without esophagitis: Secondary | ICD-10-CM | POA: Diagnosis not present

## 2020-02-13 DIAGNOSIS — Z20828 Contact with and (suspected) exposure to other viral communicable diseases: Secondary | ICD-10-CM | POA: Diagnosis not present

## 2020-02-13 DIAGNOSIS — Z20822 Contact with and (suspected) exposure to covid-19: Secondary | ICD-10-CM | POA: Diagnosis not present

## 2020-02-13 DIAGNOSIS — Z299 Encounter for prophylactic measures, unspecified: Secondary | ICD-10-CM | POA: Diagnosis not present

## 2020-02-13 DIAGNOSIS — I10 Essential (primary) hypertension: Secondary | ICD-10-CM | POA: Diagnosis not present

## 2020-02-13 DIAGNOSIS — Z713 Dietary counseling and surveillance: Secondary | ICD-10-CM | POA: Diagnosis not present

## 2020-02-13 DIAGNOSIS — Z6833 Body mass index (BMI) 33.0-33.9, adult: Secondary | ICD-10-CM | POA: Diagnosis not present

## 2020-02-19 DIAGNOSIS — Z6833 Body mass index (BMI) 33.0-33.9, adult: Secondary | ICD-10-CM | POA: Diagnosis not present

## 2020-02-19 DIAGNOSIS — I10 Essential (primary) hypertension: Secondary | ICD-10-CM | POA: Diagnosis not present

## 2020-02-19 DIAGNOSIS — Z299 Encounter for prophylactic measures, unspecified: Secondary | ICD-10-CM | POA: Diagnosis not present

## 2020-02-19 DIAGNOSIS — E1165 Type 2 diabetes mellitus with hyperglycemia: Secondary | ICD-10-CM | POA: Diagnosis not present

## 2020-02-19 DIAGNOSIS — F319 Bipolar disorder, unspecified: Secondary | ICD-10-CM | POA: Diagnosis not present

## 2020-02-29 DIAGNOSIS — E119 Type 2 diabetes mellitus without complications: Secondary | ICD-10-CM | POA: Diagnosis not present

## 2020-03-20 DIAGNOSIS — F428 Other obsessive-compulsive disorder: Secondary | ICD-10-CM | POA: Diagnosis not present

## 2020-03-29 DIAGNOSIS — E7849 Other hyperlipidemia: Secondary | ICD-10-CM | POA: Diagnosis not present

## 2020-03-29 DIAGNOSIS — K219 Gastro-esophageal reflux disease without esophagitis: Secondary | ICD-10-CM | POA: Diagnosis not present

## 2020-03-29 DIAGNOSIS — E1165 Type 2 diabetes mellitus with hyperglycemia: Secondary | ICD-10-CM | POA: Diagnosis not present

## 2020-03-29 DIAGNOSIS — I10 Essential (primary) hypertension: Secondary | ICD-10-CM | POA: Diagnosis not present

## 2020-03-30 DIAGNOSIS — E119 Type 2 diabetes mellitus without complications: Secondary | ICD-10-CM | POA: Diagnosis not present

## 2020-04-30 DIAGNOSIS — E119 Type 2 diabetes mellitus without complications: Secondary | ICD-10-CM | POA: Diagnosis not present

## 2020-05-30 DIAGNOSIS — E119 Type 2 diabetes mellitus without complications: Secondary | ICD-10-CM | POA: Diagnosis not present

## 2020-05-31 DIAGNOSIS — E1165 Type 2 diabetes mellitus with hyperglycemia: Secondary | ICD-10-CM | POA: Diagnosis not present

## 2020-05-31 DIAGNOSIS — E7849 Other hyperlipidemia: Secondary | ICD-10-CM | POA: Diagnosis not present

## 2020-05-31 DIAGNOSIS — I10 Essential (primary) hypertension: Secondary | ICD-10-CM | POA: Diagnosis not present

## 2020-07-01 DIAGNOSIS — I10 Essential (primary) hypertension: Secondary | ICD-10-CM | POA: Diagnosis not present

## 2020-07-01 DIAGNOSIS — E7849 Other hyperlipidemia: Secondary | ICD-10-CM | POA: Diagnosis not present

## 2020-07-01 DIAGNOSIS — E1165 Type 2 diabetes mellitus with hyperglycemia: Secondary | ICD-10-CM | POA: Diagnosis not present

## 2020-07-01 DIAGNOSIS — E119 Type 2 diabetes mellitus without complications: Secondary | ICD-10-CM | POA: Diagnosis not present

## 2020-07-11 DIAGNOSIS — Z789 Other specified health status: Secondary | ICD-10-CM | POA: Diagnosis not present

## 2020-07-11 DIAGNOSIS — I10 Essential (primary) hypertension: Secondary | ICD-10-CM | POA: Diagnosis not present

## 2020-07-11 DIAGNOSIS — E1165 Type 2 diabetes mellitus with hyperglycemia: Secondary | ICD-10-CM | POA: Diagnosis not present

## 2020-07-11 DIAGNOSIS — F319 Bipolar disorder, unspecified: Secondary | ICD-10-CM | POA: Diagnosis not present

## 2020-07-11 DIAGNOSIS — Z6833 Body mass index (BMI) 33.0-33.9, adult: Secondary | ICD-10-CM | POA: Diagnosis not present

## 2020-07-11 DIAGNOSIS — R030 Elevated blood-pressure reading, without diagnosis of hypertension: Secondary | ICD-10-CM | POA: Diagnosis not present

## 2020-07-11 DIAGNOSIS — Z299 Encounter for prophylactic measures, unspecified: Secondary | ICD-10-CM | POA: Diagnosis not present

## 2020-07-11 DIAGNOSIS — E119 Type 2 diabetes mellitus without complications: Secondary | ICD-10-CM | POA: Diagnosis not present

## 2020-07-15 DIAGNOSIS — R0602 Shortness of breath: Secondary | ICD-10-CM | POA: Diagnosis not present

## 2020-07-29 DIAGNOSIS — E119 Type 2 diabetes mellitus without complications: Secondary | ICD-10-CM | POA: Diagnosis not present

## 2020-08-08 DIAGNOSIS — Z6833 Body mass index (BMI) 33.0-33.9, adult: Secondary | ICD-10-CM | POA: Diagnosis not present

## 2020-08-08 DIAGNOSIS — F319 Bipolar disorder, unspecified: Secondary | ICD-10-CM | POA: Diagnosis not present

## 2020-08-08 DIAGNOSIS — Z789 Other specified health status: Secondary | ICD-10-CM | POA: Diagnosis not present

## 2020-08-08 DIAGNOSIS — E1165 Type 2 diabetes mellitus with hyperglycemia: Secondary | ICD-10-CM | POA: Diagnosis not present

## 2020-08-08 DIAGNOSIS — Z299 Encounter for prophylactic measures, unspecified: Secondary | ICD-10-CM | POA: Diagnosis not present

## 2020-08-08 DIAGNOSIS — I10 Essential (primary) hypertension: Secondary | ICD-10-CM | POA: Diagnosis not present

## 2020-08-27 DIAGNOSIS — Z7189 Other specified counseling: Secondary | ICD-10-CM | POA: Diagnosis not present

## 2020-08-27 DIAGNOSIS — Z299 Encounter for prophylactic measures, unspecified: Secondary | ICD-10-CM | POA: Diagnosis not present

## 2020-08-27 DIAGNOSIS — Z0000 Encounter for general adult medical examination without abnormal findings: Secondary | ICD-10-CM | POA: Diagnosis not present

## 2020-08-27 DIAGNOSIS — Z1339 Encounter for screening examination for other mental health and behavioral disorders: Secondary | ICD-10-CM | POA: Diagnosis not present

## 2020-08-27 DIAGNOSIS — Z6833 Body mass index (BMI) 33.0-33.9, adult: Secondary | ICD-10-CM | POA: Diagnosis not present

## 2020-08-27 DIAGNOSIS — E7800 Pure hypercholesterolemia, unspecified: Secondary | ICD-10-CM | POA: Diagnosis not present

## 2020-08-27 DIAGNOSIS — R5383 Other fatigue: Secondary | ICD-10-CM | POA: Diagnosis not present

## 2020-08-27 DIAGNOSIS — Z1331 Encounter for screening for depression: Secondary | ICD-10-CM | POA: Diagnosis not present

## 2020-08-27 DIAGNOSIS — E6609 Other obesity due to excess calories: Secondary | ICD-10-CM | POA: Diagnosis not present

## 2020-08-28 DIAGNOSIS — Z125 Encounter for screening for malignant neoplasm of prostate: Secondary | ICD-10-CM | POA: Diagnosis not present

## 2020-08-28 DIAGNOSIS — E7849 Other hyperlipidemia: Secondary | ICD-10-CM | POA: Diagnosis not present

## 2020-08-28 DIAGNOSIS — I10 Essential (primary) hypertension: Secondary | ICD-10-CM | POA: Diagnosis not present

## 2020-08-28 DIAGNOSIS — M8580 Other specified disorders of bone density and structure, unspecified site: Secondary | ICD-10-CM | POA: Diagnosis not present

## 2020-08-28 DIAGNOSIS — R5383 Other fatigue: Secondary | ICD-10-CM | POA: Diagnosis not present

## 2020-08-28 DIAGNOSIS — Z79899 Other long term (current) drug therapy: Secondary | ICD-10-CM | POA: Diagnosis not present

## 2020-08-28 DIAGNOSIS — E7800 Pure hypercholesterolemia, unspecified: Secondary | ICD-10-CM | POA: Diagnosis not present

## 2020-08-29 DIAGNOSIS — E119 Type 2 diabetes mellitus without complications: Secondary | ICD-10-CM | POA: Diagnosis not present

## 2020-09-10 DIAGNOSIS — E1165 Type 2 diabetes mellitus with hyperglycemia: Secondary | ICD-10-CM | POA: Diagnosis not present

## 2020-09-10 DIAGNOSIS — Z299 Encounter for prophylactic measures, unspecified: Secondary | ICD-10-CM | POA: Diagnosis not present

## 2020-09-10 DIAGNOSIS — R69 Illness, unspecified: Secondary | ICD-10-CM | POA: Diagnosis not present

## 2020-09-10 DIAGNOSIS — Z6833 Body mass index (BMI) 33.0-33.9, adult: Secondary | ICD-10-CM | POA: Diagnosis not present

## 2020-09-10 DIAGNOSIS — I10 Essential (primary) hypertension: Secondary | ICD-10-CM | POA: Diagnosis not present

## 2020-09-28 DIAGNOSIS — E119 Type 2 diabetes mellitus without complications: Secondary | ICD-10-CM | POA: Diagnosis not present

## 2020-10-04 DIAGNOSIS — Z6833 Body mass index (BMI) 33.0-33.9, adult: Secondary | ICD-10-CM | POA: Diagnosis not present

## 2020-10-04 DIAGNOSIS — E1165 Type 2 diabetes mellitus with hyperglycemia: Secondary | ICD-10-CM | POA: Diagnosis not present

## 2020-10-04 DIAGNOSIS — I10 Essential (primary) hypertension: Secondary | ICD-10-CM | POA: Diagnosis not present

## 2020-10-04 DIAGNOSIS — Z299 Encounter for prophylactic measures, unspecified: Secondary | ICD-10-CM | POA: Diagnosis not present

## 2020-10-04 DIAGNOSIS — R69 Illness, unspecified: Secondary | ICD-10-CM | POA: Diagnosis not present

## 2020-10-04 DIAGNOSIS — I2510 Atherosclerotic heart disease of native coronary artery without angina pectoris: Secondary | ICD-10-CM | POA: Diagnosis not present

## 2020-10-15 DIAGNOSIS — R69 Illness, unspecified: Secondary | ICD-10-CM | POA: Diagnosis not present

## 2020-10-16 DIAGNOSIS — R69 Illness, unspecified: Secondary | ICD-10-CM | POA: Diagnosis not present

## 2020-10-18 DIAGNOSIS — Z6833 Body mass index (BMI) 33.0-33.9, adult: Secondary | ICD-10-CM | POA: Diagnosis not present

## 2020-10-18 DIAGNOSIS — Z299 Encounter for prophylactic measures, unspecified: Secondary | ICD-10-CM | POA: Diagnosis not present

## 2020-10-18 DIAGNOSIS — I10 Essential (primary) hypertension: Secondary | ICD-10-CM | POA: Diagnosis not present

## 2020-10-18 DIAGNOSIS — E1165 Type 2 diabetes mellitus with hyperglycemia: Secondary | ICD-10-CM | POA: Diagnosis not present

## 2020-10-18 DIAGNOSIS — M549 Dorsalgia, unspecified: Secondary | ICD-10-CM | POA: Diagnosis not present

## 2020-10-18 DIAGNOSIS — R69 Illness, unspecified: Secondary | ICD-10-CM | POA: Diagnosis not present

## 2020-10-29 DIAGNOSIS — E119 Type 2 diabetes mellitus without complications: Secondary | ICD-10-CM | POA: Diagnosis not present

## 2020-11-22 DIAGNOSIS — J320 Chronic maxillary sinusitis: Secondary | ICD-10-CM | POA: Diagnosis not present

## 2020-11-22 DIAGNOSIS — Z299 Encounter for prophylactic measures, unspecified: Secondary | ICD-10-CM | POA: Diagnosis not present

## 2020-11-25 DIAGNOSIS — E1165 Type 2 diabetes mellitus with hyperglycemia: Secondary | ICD-10-CM | POA: Diagnosis not present

## 2020-11-25 DIAGNOSIS — Z299 Encounter for prophylactic measures, unspecified: Secondary | ICD-10-CM | POA: Diagnosis not present

## 2020-11-25 DIAGNOSIS — Z6833 Body mass index (BMI) 33.0-33.9, adult: Secondary | ICD-10-CM | POA: Diagnosis not present

## 2020-11-25 DIAGNOSIS — Z713 Dietary counseling and surveillance: Secondary | ICD-10-CM | POA: Diagnosis not present

## 2020-11-25 DIAGNOSIS — Z711 Person with feared health complaint in whom no diagnosis is made: Secondary | ICD-10-CM | POA: Diagnosis not present

## 2020-11-27 DIAGNOSIS — R69 Illness, unspecified: Secondary | ICD-10-CM | POA: Diagnosis not present

## 2020-11-28 DIAGNOSIS — E119 Type 2 diabetes mellitus without complications: Secondary | ICD-10-CM | POA: Diagnosis not present

## 2020-12-27 DIAGNOSIS — E119 Type 2 diabetes mellitus without complications: Secondary | ICD-10-CM | POA: Diagnosis not present

## 2020-12-29 DIAGNOSIS — E119 Type 2 diabetes mellitus without complications: Secondary | ICD-10-CM | POA: Diagnosis not present

## 2020-12-31 DIAGNOSIS — H35372 Puckering of macula, left eye: Secondary | ICD-10-CM | POA: Diagnosis not present

## 2020-12-31 DIAGNOSIS — H26492 Other secondary cataract, left eye: Secondary | ICD-10-CM | POA: Diagnosis not present

## 2020-12-31 DIAGNOSIS — H2511 Age-related nuclear cataract, right eye: Secondary | ICD-10-CM | POA: Diagnosis not present

## 2021-01-23 DIAGNOSIS — Z299 Encounter for prophylactic measures, unspecified: Secondary | ICD-10-CM | POA: Diagnosis not present

## 2021-01-23 DIAGNOSIS — I10 Essential (primary) hypertension: Secondary | ICD-10-CM | POA: Diagnosis not present

## 2021-01-23 DIAGNOSIS — Z6834 Body mass index (BMI) 34.0-34.9, adult: Secondary | ICD-10-CM | POA: Diagnosis not present

## 2021-01-23 DIAGNOSIS — E1165 Type 2 diabetes mellitus with hyperglycemia: Secondary | ICD-10-CM | POA: Diagnosis not present

## 2021-01-23 DIAGNOSIS — M1710 Unilateral primary osteoarthritis, unspecified knee: Secondary | ICD-10-CM | POA: Diagnosis not present

## 2021-01-29 DIAGNOSIS — E119 Type 2 diabetes mellitus without complications: Secondary | ICD-10-CM | POA: Diagnosis not present

## 2021-01-29 DIAGNOSIS — I10 Essential (primary) hypertension: Secondary | ICD-10-CM | POA: Diagnosis not present

## 2021-01-29 DIAGNOSIS — K219 Gastro-esophageal reflux disease without esophagitis: Secondary | ICD-10-CM | POA: Diagnosis not present

## 2021-02-27 DIAGNOSIS — Z23 Encounter for immunization: Secondary | ICD-10-CM | POA: Diagnosis not present

## 2021-02-27 DIAGNOSIS — I10 Essential (primary) hypertension: Secondary | ICD-10-CM | POA: Diagnosis not present

## 2021-02-27 DIAGNOSIS — R69 Illness, unspecified: Secondary | ICD-10-CM | POA: Diagnosis not present

## 2021-02-27 DIAGNOSIS — Z299 Encounter for prophylactic measures, unspecified: Secondary | ICD-10-CM | POA: Diagnosis not present

## 2021-02-27 DIAGNOSIS — Z6834 Body mass index (BMI) 34.0-34.9, adult: Secondary | ICD-10-CM | POA: Diagnosis not present

## 2021-02-27 DIAGNOSIS — E1165 Type 2 diabetes mellitus with hyperglycemia: Secondary | ICD-10-CM | POA: Diagnosis not present

## 2021-02-28 DIAGNOSIS — E119 Type 2 diabetes mellitus without complications: Secondary | ICD-10-CM | POA: Diagnosis not present

## 2021-03-20 DIAGNOSIS — Z23 Encounter for immunization: Secondary | ICD-10-CM | POA: Diagnosis not present

## 2021-03-28 DIAGNOSIS — Z6833 Body mass index (BMI) 33.0-33.9, adult: Secondary | ICD-10-CM | POA: Diagnosis not present

## 2021-03-28 DIAGNOSIS — R059 Cough, unspecified: Secondary | ICD-10-CM | POA: Diagnosis not present

## 2021-03-28 DIAGNOSIS — Z299 Encounter for prophylactic measures, unspecified: Secondary | ICD-10-CM | POA: Diagnosis not present

## 2021-03-28 DIAGNOSIS — R062 Wheezing: Secondary | ICD-10-CM | POA: Diagnosis not present

## 2021-03-28 DIAGNOSIS — M1710 Unilateral primary osteoarthritis, unspecified knee: Secondary | ICD-10-CM | POA: Diagnosis not present

## 2021-03-28 DIAGNOSIS — I10 Essential (primary) hypertension: Secondary | ICD-10-CM | POA: Diagnosis not present

## 2021-03-31 DIAGNOSIS — E119 Type 2 diabetes mellitus without complications: Secondary | ICD-10-CM | POA: Diagnosis not present

## 2021-04-30 DIAGNOSIS — E119 Type 2 diabetes mellitus without complications: Secondary | ICD-10-CM | POA: Diagnosis not present

## 2021-05-30 DIAGNOSIS — E119 Type 2 diabetes mellitus without complications: Secondary | ICD-10-CM | POA: Diagnosis not present

## 2021-06-10 DIAGNOSIS — Z0100 Encounter for examination of eyes and vision without abnormal findings: Secondary | ICD-10-CM | POA: Diagnosis not present

## 2021-06-10 DIAGNOSIS — H2511 Age-related nuclear cataract, right eye: Secondary | ICD-10-CM | POA: Diagnosis not present

## 2021-06-10 DIAGNOSIS — H5200 Hypermetropia, unspecified eye: Secondary | ICD-10-CM | POA: Diagnosis not present

## 2021-06-10 DIAGNOSIS — E119 Type 2 diabetes mellitus without complications: Secondary | ICD-10-CM | POA: Diagnosis not present

## 2021-06-29 DIAGNOSIS — E119 Type 2 diabetes mellitus without complications: Secondary | ICD-10-CM | POA: Diagnosis not present

## 2021-07-04 DIAGNOSIS — E1165 Type 2 diabetes mellitus with hyperglycemia: Secondary | ICD-10-CM | POA: Diagnosis not present

## 2021-07-04 DIAGNOSIS — Z6833 Body mass index (BMI) 33.0-33.9, adult: Secondary | ICD-10-CM | POA: Diagnosis not present

## 2021-07-04 DIAGNOSIS — L89309 Pressure ulcer of unspecified buttock, unspecified stage: Secondary | ICD-10-CM | POA: Diagnosis not present

## 2021-07-04 DIAGNOSIS — R69 Illness, unspecified: Secondary | ICD-10-CM | POA: Diagnosis not present

## 2021-07-04 DIAGNOSIS — Z789 Other specified health status: Secondary | ICD-10-CM | POA: Diagnosis not present

## 2021-07-04 DIAGNOSIS — Z299 Encounter for prophylactic measures, unspecified: Secondary | ICD-10-CM | POA: Diagnosis not present

## 2021-07-04 DIAGNOSIS — I10 Essential (primary) hypertension: Secondary | ICD-10-CM | POA: Diagnosis not present

## 2021-07-29 DIAGNOSIS — E119 Type 2 diabetes mellitus without complications: Secondary | ICD-10-CM | POA: Diagnosis not present

## 2021-08-22 DIAGNOSIS — Z6831 Body mass index (BMI) 31.0-31.9, adult: Secondary | ICD-10-CM | POA: Diagnosis not present

## 2021-08-22 DIAGNOSIS — E1165 Type 2 diabetes mellitus with hyperglycemia: Secondary | ICD-10-CM | POA: Diagnosis not present

## 2021-08-22 DIAGNOSIS — Z299 Encounter for prophylactic measures, unspecified: Secondary | ICD-10-CM | POA: Diagnosis not present

## 2021-08-22 DIAGNOSIS — Z789 Other specified health status: Secondary | ICD-10-CM | POA: Diagnosis not present

## 2021-08-22 DIAGNOSIS — I10 Essential (primary) hypertension: Secondary | ICD-10-CM | POA: Diagnosis not present

## 2021-08-28 DIAGNOSIS — Z789 Other specified health status: Secondary | ICD-10-CM | POA: Diagnosis not present

## 2021-08-28 DIAGNOSIS — E119 Type 2 diabetes mellitus without complications: Secondary | ICD-10-CM | POA: Diagnosis not present

## 2021-08-28 DIAGNOSIS — K529 Noninfective gastroenteritis and colitis, unspecified: Secondary | ICD-10-CM | POA: Diagnosis not present

## 2021-08-28 DIAGNOSIS — R6889 Other general symptoms and signs: Secondary | ICD-10-CM | POA: Diagnosis not present

## 2021-08-28 DIAGNOSIS — Z6831 Body mass index (BMI) 31.0-31.9, adult: Secondary | ICD-10-CM | POA: Diagnosis not present

## 2021-08-28 DIAGNOSIS — I10 Essential (primary) hypertension: Secondary | ICD-10-CM | POA: Diagnosis not present

## 2021-08-28 DIAGNOSIS — Z299 Encounter for prophylactic measures, unspecified: Secondary | ICD-10-CM | POA: Diagnosis not present

## 2021-09-26 DIAGNOSIS — E1165 Type 2 diabetes mellitus with hyperglycemia: Secondary | ICD-10-CM | POA: Diagnosis not present

## 2021-09-26 DIAGNOSIS — Z713 Dietary counseling and surveillance: Secondary | ICD-10-CM | POA: Diagnosis not present

## 2021-09-26 DIAGNOSIS — I10 Essential (primary) hypertension: Secondary | ICD-10-CM | POA: Diagnosis not present

## 2021-09-26 DIAGNOSIS — Z299 Encounter for prophylactic measures, unspecified: Secondary | ICD-10-CM | POA: Diagnosis not present

## 2021-09-26 DIAGNOSIS — Z6831 Body mass index (BMI) 31.0-31.9, adult: Secondary | ICD-10-CM | POA: Diagnosis not present

## 2021-09-28 DIAGNOSIS — E119 Type 2 diabetes mellitus without complications: Secondary | ICD-10-CM | POA: Diagnosis not present

## 2021-09-29 DIAGNOSIS — I10 Essential (primary) hypertension: Secondary | ICD-10-CM | POA: Diagnosis not present

## 2021-09-29 DIAGNOSIS — Z794 Long term (current) use of insulin: Secondary | ICD-10-CM | POA: Diagnosis not present

## 2021-09-29 DIAGNOSIS — E669 Obesity, unspecified: Secondary | ICD-10-CM | POA: Diagnosis not present

## 2021-09-29 DIAGNOSIS — K219 Gastro-esophageal reflux disease without esophagitis: Secondary | ICD-10-CM | POA: Diagnosis not present

## 2021-09-29 DIAGNOSIS — E119 Type 2 diabetes mellitus without complications: Secondary | ICD-10-CM | POA: Diagnosis not present

## 2021-09-29 DIAGNOSIS — M1990 Unspecified osteoarthritis, unspecified site: Secondary | ICD-10-CM | POA: Diagnosis not present

## 2021-09-29 DIAGNOSIS — Z6832 Body mass index (BMI) 32.0-32.9, adult: Secondary | ICD-10-CM | POA: Diagnosis not present

## 2021-09-29 DIAGNOSIS — Z7984 Long term (current) use of oral hypoglycemic drugs: Secondary | ICD-10-CM | POA: Diagnosis not present

## 2021-09-29 DIAGNOSIS — R69 Illness, unspecified: Secondary | ICD-10-CM | POA: Diagnosis not present

## 2021-09-29 DIAGNOSIS — Z008 Encounter for other general examination: Secondary | ICD-10-CM | POA: Diagnosis not present

## 2021-09-29 DIAGNOSIS — I2510 Atherosclerotic heart disease of native coronary artery without angina pectoris: Secondary | ICD-10-CM | POA: Diagnosis not present

## 2021-09-29 DIAGNOSIS — E785 Hyperlipidemia, unspecified: Secondary | ICD-10-CM | POA: Diagnosis not present

## 2021-09-29 DIAGNOSIS — G4700 Insomnia, unspecified: Secondary | ICD-10-CM | POA: Diagnosis not present

## 2021-10-28 DIAGNOSIS — E119 Type 2 diabetes mellitus without complications: Secondary | ICD-10-CM | POA: Diagnosis not present

## 2021-10-29 DIAGNOSIS — Z299 Encounter for prophylactic measures, unspecified: Secondary | ICD-10-CM | POA: Diagnosis not present

## 2021-10-29 DIAGNOSIS — R69 Illness, unspecified: Secondary | ICD-10-CM | POA: Diagnosis not present

## 2021-10-29 DIAGNOSIS — I10 Essential (primary) hypertension: Secondary | ICD-10-CM | POA: Diagnosis not present

## 2021-10-29 DIAGNOSIS — K219 Gastro-esophageal reflux disease without esophagitis: Secondary | ICD-10-CM | POA: Diagnosis not present

## 2021-10-29 DIAGNOSIS — E1165 Type 2 diabetes mellitus with hyperglycemia: Secondary | ICD-10-CM | POA: Diagnosis not present

## 2021-10-29 DIAGNOSIS — Z6831 Body mass index (BMI) 31.0-31.9, adult: Secondary | ICD-10-CM | POA: Diagnosis not present

## 2021-10-29 DIAGNOSIS — Z789 Other specified health status: Secondary | ICD-10-CM | POA: Diagnosis not present

## 2021-11-21 DIAGNOSIS — R69 Illness, unspecified: Secondary | ICD-10-CM | POA: Diagnosis not present

## 2021-11-21 DIAGNOSIS — I10 Essential (primary) hypertension: Secondary | ICD-10-CM | POA: Diagnosis not present

## 2021-11-21 DIAGNOSIS — Z299 Encounter for prophylactic measures, unspecified: Secondary | ICD-10-CM | POA: Diagnosis not present

## 2021-11-21 DIAGNOSIS — G4700 Insomnia, unspecified: Secondary | ICD-10-CM | POA: Diagnosis not present

## 2021-11-21 DIAGNOSIS — J302 Other seasonal allergic rhinitis: Secondary | ICD-10-CM | POA: Diagnosis not present

## 2021-11-27 DIAGNOSIS — E119 Type 2 diabetes mellitus without complications: Secondary | ICD-10-CM | POA: Diagnosis not present

## 2021-12-29 DIAGNOSIS — E119 Type 2 diabetes mellitus without complications: Secondary | ICD-10-CM | POA: Diagnosis not present

## 2022-01-02 DIAGNOSIS — M25552 Pain in left hip: Secondary | ICD-10-CM | POA: Diagnosis not present

## 2022-01-02 DIAGNOSIS — Z789 Other specified health status: Secondary | ICD-10-CM | POA: Diagnosis not present

## 2022-01-02 DIAGNOSIS — Z299 Encounter for prophylactic measures, unspecified: Secondary | ICD-10-CM | POA: Diagnosis not present

## 2022-01-02 DIAGNOSIS — E1165 Type 2 diabetes mellitus with hyperglycemia: Secondary | ICD-10-CM | POA: Diagnosis not present

## 2022-01-02 DIAGNOSIS — I10 Essential (primary) hypertension: Secondary | ICD-10-CM | POA: Diagnosis not present

## 2022-01-02 DIAGNOSIS — F319 Bipolar disorder, unspecified: Secondary | ICD-10-CM | POA: Diagnosis not present

## 2022-01-19 ENCOUNTER — Other Ambulatory Visit: Payer: Self-pay | Admitting: *Deleted

## 2022-01-19 ENCOUNTER — Encounter: Payer: Self-pay | Admitting: *Deleted

## 2022-01-19 ENCOUNTER — Telehealth: Payer: Self-pay

## 2022-01-19 DIAGNOSIS — E1169 Type 2 diabetes mellitus with other specified complication: Secondary | ICD-10-CM

## 2022-01-19 NOTE — Patient Outreach (Signed)
  Care Coordination   Initial Visit Note   01/19/2022 Name: Michael Rose MRN: [id number] DOB: 14-Dec-1950  Michael Rose is a 71 y.o. year old male who sees Michael Blitz, MD for primary care. I spoke with  Michael Rose and wife Michael Rose by phone today  What matters to the patients health and wellness today?  They are aware of member's A1C being slightly elevated at 7.4, verbalizes understanding of DM management but unable to afford food.  State they receive $23 in food stamps monthly but this does not help.  Report they don't have anything in the house and has not had anything to eat today.  Inquired if there was family they could call to bring food, wife's brother but she is reluctant to call as they will owe him back next month, making funds short.  Aware that this RNCM will contact community resource care guide as well as CSW to collaborate on options.    Member unsure if he has had his AWV this year yet, encouraged to call to schedule.     Goals Addressed               This Visit's Progress     Ability to pay for food and medications (pt-stated)        Care Coordination Interventions: Reviewed medications with patient and discussed importance of medication adherence Reviewed scheduled/upcoming provider appointments including: need for AWV Referral made to pharmacy team for assistance with cost of Ozempic and Novolog Referral made to community resources care guide team for assistance with food resources Assessed social determinant of health barriers         SDOH assessments and interventions completed:  Yes  SDOH Interventions Today    Flowsheet Row Most Recent Value  SDOH Interventions   Food Insecurity Interventions Other (Comment)  [Referral to care guide]  Financial Strain Interventions Other (Comment)  [Referrals placed to care guide and pharmacy team]  Housing Interventions Intervention Not Indicated  Transportation Interventions Intervention Not Indicated         Care Coordination Interventions Activated:  Yes  Care Coordination Interventions:  Yes, provided   Follow up plan: Referral made to Pharmacy team and community resource guide.      Encounter Outcome:  Pt. Visit Completed   Michael David, RN, MSN, [facility] Care Coordinator [phone]

## 2022-01-19 NOTE — Telephone Encounter (Signed)
   Telephone encounter was:  Successful.  01/19/2022 Name: SHAWNN BOUILLON MRN: [id number] DOB: [birth year]  XZAYVION VAETH is a 71 y.o. year old male who is a primary care patient of Monico Blitz, MD . The community resource team was consulted for assistance with [place] guide performed the following interventions: Spoke to patient's wife [name], spoke with Harle Stanford at [facility] in [place] placed referral.  Shon Hough will contact the patient today and the earliest they can provide an emergency food box will be tomorrow morning.  Verified Betty's email address [email or web address], sent food pantry list. [name] stated that they have transportation to pick it up. Gave my name and number to call if she does not receive the email. Letter is saved in Epic.  Follow Up Plan:  No further follow up planned at this time. The patient has been provided with needed resources.  Phillips Goulette, AAS Paralegal, [facility] [employer]  [address], [place] [address] ??[email or web address]  ?? [phone]   [email or web address]

## 2022-01-28 DIAGNOSIS — E119 Type 2 diabetes mellitus without complications: Secondary | ICD-10-CM | POA: Diagnosis not present

## 2022-02-03 DIAGNOSIS — F319 Bipolar disorder, unspecified: Secondary | ICD-10-CM | POA: Diagnosis not present

## 2022-02-03 DIAGNOSIS — Z6830 Body mass index (BMI) 30.0-30.9, adult: Secondary | ICD-10-CM | POA: Diagnosis not present

## 2022-02-03 DIAGNOSIS — M25562 Pain in left knee: Secondary | ICD-10-CM | POA: Diagnosis not present

## 2022-02-03 DIAGNOSIS — Z299 Encounter for prophylactic measures, unspecified: Secondary | ICD-10-CM | POA: Diagnosis not present

## 2022-02-03 DIAGNOSIS — E1165 Type 2 diabetes mellitus with hyperglycemia: Secondary | ICD-10-CM | POA: Diagnosis not present

## 2022-02-03 DIAGNOSIS — I10 Essential (primary) hypertension: Secondary | ICD-10-CM | POA: Diagnosis not present

## 2022-02-17 DIAGNOSIS — Z6830 Body mass index (BMI) 30.0-30.9, adult: Secondary | ICD-10-CM | POA: Diagnosis not present

## 2022-02-17 DIAGNOSIS — Z789 Other specified health status: Secondary | ICD-10-CM | POA: Diagnosis not present

## 2022-02-17 DIAGNOSIS — E1165 Type 2 diabetes mellitus with hyperglycemia: Secondary | ICD-10-CM | POA: Diagnosis not present

## 2022-02-17 DIAGNOSIS — F319 Bipolar disorder, unspecified: Secondary | ICD-10-CM | POA: Diagnosis not present

## 2022-02-17 DIAGNOSIS — M25562 Pain in left knee: Secondary | ICD-10-CM | POA: Diagnosis not present

## 2022-02-17 DIAGNOSIS — E7800 Pure hypercholesterolemia, unspecified: Secondary | ICD-10-CM | POA: Diagnosis not present

## 2022-02-17 DIAGNOSIS — I10 Essential (primary) hypertension: Secondary | ICD-10-CM | POA: Diagnosis not present

## 2022-02-17 DIAGNOSIS — Z299 Encounter for prophylactic measures, unspecified: Secondary | ICD-10-CM | POA: Diagnosis not present

## 2022-02-27 DIAGNOSIS — E119 Type 2 diabetes mellitus without complications: Secondary | ICD-10-CM | POA: Diagnosis not present

## 2022-03-30 DIAGNOSIS — E119 Type 2 diabetes mellitus without complications: Secondary | ICD-10-CM | POA: Diagnosis not present

## 2022-04-07 DIAGNOSIS — K5900 Constipation, unspecified: Secondary | ICD-10-CM | POA: Diagnosis not present

## 2022-04-07 DIAGNOSIS — R32 Unspecified urinary incontinence: Secondary | ICD-10-CM | POA: Diagnosis not present

## 2022-04-07 DIAGNOSIS — I10 Essential (primary) hypertension: Secondary | ICD-10-CM | POA: Diagnosis not present

## 2022-04-07 DIAGNOSIS — Z299 Encounter for prophylactic measures, unspecified: Secondary | ICD-10-CM | POA: Diagnosis not present

## 2022-04-20 ENCOUNTER — Ambulatory Visit (INDEPENDENT_AMBULATORY_CARE_PROVIDER_SITE_OTHER): Payer: Medicare PPO | Admitting: Urology

## 2022-04-20 VITALS — BP 148/97 | HR 134

## 2022-04-20 DIAGNOSIS — R32 Unspecified urinary incontinence: Secondary | ICD-10-CM

## 2022-04-20 DIAGNOSIS — N401 Enlarged prostate with lower urinary tract symptoms: Secondary | ICD-10-CM | POA: Diagnosis not present

## 2022-04-20 DIAGNOSIS — N138 Other obstructive and reflux uropathy: Secondary | ICD-10-CM | POA: Diagnosis not present

## 2022-04-20 LAB — BLADDER SCAN AMB NON-IMAGING: Scan Result: 34

## 2022-04-20 MED ORDER — TAMSULOSIN HCL 0.4 MG PO CAPS: 0.4000 mg | ORAL_CAPSULE | Freq: Every day | ORAL | 11 refills | Status: DC

## 2022-04-20 NOTE — Progress Notes (Unsigned)
04/20/2022 3:03 PM   Michael Rose Dec 19, 1950 [id number]  Referring provider: Monico Blitz, MD [address] [place],  [place] [address]  No chief complaint on file.   HPI:  New pt -   1) BPH - LUTS- . No meds or surgery. He has urinary hesitancy and straining. He has a weak stream. He has urgency and can have UUI.   AUASS = 8. No NG Risk. PVR = 34 ml. He had vasectomy.    Today, seen for the above.   He retired from Architect work.       PMH: Past Medical History:  Diagnosis Date   Allergic reaction to contrast dye    Bipolar affective disorder ([place])    Coronary atherosclerosis of native coronary artery    BMS to RCA 2008, [facility] cardiology, Dr. Tamala Julian   GERD (gastroesophageal reflux disease)    Hard of hearing    Right ear   Hyperlipidemia    Hypertension    Myocardial infarction [facility])    IMI 2008   Osteoarthritis    Ringing in right ear     Surgical History: Past Surgical History:  Procedure Laterality Date   CHOLECYSTECTOMY  2013   JOINT REPLACEMENT     TOTAL KNEE ARTHROPLASTY     Left   TOTAL KNEE ARTHROPLASTY Right 10/21/2012   Dr French Ana   TOTAL KNEE ARTHROPLASTY Right 10/21/2012   Procedure: TOTAL KNEE ARTHROPLASTY;  Surgeon: Yvette Rack., MD;  Location: [facility];  Service: Orthopedics;  Laterality: Right;   VASECTOMY      Home Medications:  Allergies as of 04/20/2022       Reactions   Iodinated Contrast Media Hives, Itching   Penicillins Other (See Comments)   chillhood allergy         Medication List        Accurate as of April 20, 2022  3:03 PM. If you have any questions, ask your nurse or doctor.          acetaminophen 325 MG tablet Commonly known as: TYLENOL Take 650 mg by mouth as needed.   aspirin 81 MG tablet Take 81 mg by mouth daily.   atorvastatin 20 MG tablet Commonly known as: LIPITOR Take 10 mg by mouth daily.   Dexilant 60 MG capsule Generic drug: dexlansoprazole Take 60 mg by mouth daily with  breakfast.   fluvoxaMINE 100 MG tablet Commonly known as: LUVOX Take 100 mg by mouth daily with breakfast.   glimepiride 2 MG tablet Commonly known as: AMARYL Take 2 mg by mouth daily with breakfast.   isosorbide mononitrate 30 MG 24 hr tablet Commonly known as: IMDUR TAKE 1 TABLET BY MOUTH EVERY DAY   Jardiance 25 MG Tabs tablet Generic drug: empagliflozin Take 25 mg by mouth daily.   loratadine 10 MG tablet Commonly known as: CLARITIN Take 10 mg by mouth daily.   metoprolol succinate 50 MG 24 hr tablet Commonly known as: TOPROL-XL TAKE 1 TABLET BY MOUTH EVERY MORNING AND TAKE 1/2 TABLET EVERY EVENING - NEEDS APPOINTMENT   mirtazapine 30 MG tablet Commonly known as: REMERON Take 30 mg by mouth at bedtime.   nitroGLYCERIN 0.4 MG SL tablet Commonly known as: NITROSTAT Place 1 tablet (0.4 mg total) under the tongue every 5 (five) minutes as needed for chest pain.   traZODone 50 MG tablet Commonly known as: DESYREL Take 50 mg by mouth at bedtime.        Allergies:  Allergies  Allergen  Reactions   Iodinated Contrast Media Hives and Itching   Penicillins Other (See Comments)    chillhood allergy     Family History: Family History  Problem Relation Age of Onset   Heart disease Mother    Heart disease Father     Social History:  reports that he has never smoked. He has never used smokeless tobacco. He reports that he does not drink alcohol and does not use drugs.   Physical Exam: BP (!) 148/97   Pulse (!) 134   Constitutional:  Alert and oriented, No acute distress. HEENT: [place] AT, moist mucus membranes.  Trachea midline, no masses. Cardiovascular: No clubbing, cyanosis, or edema. Respiratory: Normal respiratory effort, no increased work of breathing. GI: Abdomen is soft, nontender, nondistended, no abdominal masses GU: No CVA tenderness Lymph: No cervical or inguinal lymphadenopathy. Skin: No rashes, bruises or suspicious lesions. Neurologic: Grossly  intact, no focal deficits, moving all 4 extremities. Psychiatric: Normal mood and affect. GU: Penis circumcised, normal foreskin, testicles descended bilaterally and palpably normal, bilateral epididymis palpably normal, scrotum normal DRE: Prostate 30 g, smooth without hard area or nodule   Laboratory Data: Lab Results  Component Value Date   WBC 10.1 10/24/2012   HGB 10.5 (L) 10/24/2012   HCT 31.5 (L) 10/24/2012   MCV 79.3 10/24/2012   PLT 130 (L) 10/24/2012    Lab Results  Component Value Date   CREATININE 1.11 10/22/2012    No results found for: "PSA"  No results found for: "TESTOSTERONE"  No results found for: "HGBA1C"  Urinalysis    Component Value Date/Time   COLORURINE YELLOW 10/13/2012 [place] 10/13/2012 1209   LABSPEC 1.017 10/13/2012 1209   PHURINE 6.5 10/13/2012 1209   [place] 10/13/2012 1209   [place] 10/13/2012 [place] 10/13/2012 [place] 10/13/2012 [place] 10/13/2012 1209   UROBILINOGEN 1.0 10/13/2012 1209   NITRITE NEGATIVE 10/13/2012 1209   LEUKOCYTESUR NEGATIVE 10/13/2012 1209    No results found for: "LABMICR", "WBCUA", "RBCUA", "LABEPIT", "MUCUS", "BACTERIA"   Assessment & Plan:    1. Urinary incontinence, unspecified type Disc nature r/b/a to trial of tamsulosin and he will start.  - Urinalysis, Routine w reflex microscopic - BLADDER SCAN AMB NON-IMAGING  2. BPH - as above. PSA was sent. DRE was normal. PVR normal.   No follow-ups on file.  Festus Aloe, MD  [facility]  [address] [place], [place] [address] [phone]

## 2022-04-20 NOTE — Progress Notes (Unsigned)
post void residual=34 ?

## 2022-04-21 LAB — URINALYSIS, ROUTINE W REFLEX MICROSCOPIC
Bilirubin, UA: NEGATIVE
Ketones, UA: NEGATIVE
Leukocytes,UA: NEGATIVE
Nitrite, UA: NEGATIVE
Protein,UA: NEGATIVE
RBC, UA: NEGATIVE
Specific Gravity, UA: 1.015 (ref 1.005–1.030)
Urobilinogen, Ur: 0.2 mg/dL (ref 0.2–1.0)
pH, UA: 5 (ref 5.0–7.5)

## 2022-04-21 LAB — PSA: Prostate Specific Ag, Serum: 3 ng/mL (ref 0.0–4.0)

## 2022-04-29 DIAGNOSIS — E119 Type 2 diabetes mellitus without complications: Secondary | ICD-10-CM | POA: Diagnosis not present

## 2022-05-01 ENCOUNTER — Telehealth: Payer: Self-pay

## 2022-05-01 NOTE — Telephone Encounter (Signed)
Patient called to f/u on his lab results.  Informed pt's wife MD has not reviewed results at this time to call back Monday if he does not hear from our office.

## 2022-05-04 ENCOUNTER — Telehealth: Payer: Self-pay

## 2022-05-04 NOTE — Telephone Encounter (Signed)
Patient called and made aware.

## 2022-05-04 NOTE — Telephone Encounter (Signed)
Patient needing lab results.   Call back:  [phone]  Thanks, Helene Kelp

## 2022-05-08 DIAGNOSIS — Z1339 Encounter for screening examination for other mental health and behavioral disorders: Secondary | ICD-10-CM | POA: Diagnosis not present

## 2022-05-08 DIAGNOSIS — Z6829 Body mass index (BMI) 29.0-29.9, adult: Secondary | ICD-10-CM | POA: Diagnosis not present

## 2022-05-08 DIAGNOSIS — E1165 Type 2 diabetes mellitus with hyperglycemia: Secondary | ICD-10-CM | POA: Diagnosis not present

## 2022-05-08 DIAGNOSIS — I10 Essential (primary) hypertension: Secondary | ICD-10-CM | POA: Diagnosis not present

## 2022-05-08 DIAGNOSIS — Z1331 Encounter for screening for depression: Secondary | ICD-10-CM | POA: Diagnosis not present

## 2022-05-08 DIAGNOSIS — Z299 Encounter for prophylactic measures, unspecified: Secondary | ICD-10-CM | POA: Diagnosis not present

## 2022-05-08 DIAGNOSIS — Z0000 Encounter for general adult medical examination without abnormal findings: Secondary | ICD-10-CM | POA: Diagnosis not present

## 2022-05-08 DIAGNOSIS — E7800 Pure hypercholesterolemia, unspecified: Secondary | ICD-10-CM | POA: Diagnosis not present

## 2022-05-08 DIAGNOSIS — Z7189 Other specified counseling: Secondary | ICD-10-CM | POA: Diagnosis not present

## 2022-05-11 DIAGNOSIS — Z79899 Other long term (current) drug therapy: Secondary | ICD-10-CM | POA: Diagnosis not present

## 2022-05-11 DIAGNOSIS — R5383 Other fatigue: Secondary | ICD-10-CM | POA: Diagnosis not present

## 2022-05-11 DIAGNOSIS — E7800 Pure hypercholesterolemia, unspecified: Secondary | ICD-10-CM | POA: Diagnosis not present

## 2022-05-21 DIAGNOSIS — I10 Essential (primary) hypertension: Secondary | ICD-10-CM | POA: Diagnosis not present

## 2022-05-21 DIAGNOSIS — Z299 Encounter for prophylactic measures, unspecified: Secondary | ICD-10-CM | POA: Diagnosis not present

## 2022-05-21 DIAGNOSIS — E1165 Type 2 diabetes mellitus with hyperglycemia: Secondary | ICD-10-CM | POA: Diagnosis not present

## 2022-05-21 DIAGNOSIS — M25562 Pain in left knee: Secondary | ICD-10-CM | POA: Diagnosis not present

## 2022-05-29 DIAGNOSIS — E119 Type 2 diabetes mellitus without complications: Secondary | ICD-10-CM | POA: Diagnosis not present

## 2022-06-04 DIAGNOSIS — M545 Low back pain, unspecified: Secondary | ICD-10-CM | POA: Diagnosis not present

## 2022-06-04 DIAGNOSIS — Z299 Encounter for prophylactic measures, unspecified: Secondary | ICD-10-CM | POA: Diagnosis not present

## 2022-06-04 DIAGNOSIS — R1031 Right lower quadrant pain: Secondary | ICD-10-CM | POA: Diagnosis not present

## 2022-06-04 DIAGNOSIS — E1165 Type 2 diabetes mellitus with hyperglycemia: Secondary | ICD-10-CM | POA: Diagnosis not present

## 2022-06-04 DIAGNOSIS — R339 Retention of urine, unspecified: Secondary | ICD-10-CM | POA: Diagnosis not present

## 2022-06-11 DIAGNOSIS — Z299 Encounter for prophylactic measures, unspecified: Secondary | ICD-10-CM | POA: Diagnosis not present

## 2022-06-11 DIAGNOSIS — I10 Essential (primary) hypertension: Secondary | ICD-10-CM | POA: Diagnosis not present

## 2022-06-11 DIAGNOSIS — R339 Retention of urine, unspecified: Secondary | ICD-10-CM | POA: Diagnosis not present

## 2022-06-15 ENCOUNTER — Ambulatory Visit: Payer: Medicare Other | Admitting: Urology

## 2022-06-25 DIAGNOSIS — M9902 Segmental and somatic dysfunction of thoracic region: Secondary | ICD-10-CM | POA: Diagnosis not present

## 2022-06-25 DIAGNOSIS — M9901 Segmental and somatic dysfunction of cervical region: Secondary | ICD-10-CM | POA: Diagnosis not present

## 2022-06-25 DIAGNOSIS — S134XXA Sprain of ligaments of cervical spine, initial encounter: Secondary | ICD-10-CM | POA: Diagnosis not present

## 2022-06-25 DIAGNOSIS — M9903 Segmental and somatic dysfunction of lumbar region: Secondary | ICD-10-CM | POA: Diagnosis not present

## 2022-06-25 DIAGNOSIS — S338XXA Sprain of other parts of lumbar spine and pelvis, initial encounter: Secondary | ICD-10-CM | POA: Diagnosis not present

## 2022-06-25 DIAGNOSIS — S233XXA Sprain of ligaments of thoracic spine, initial encounter: Secondary | ICD-10-CM | POA: Diagnosis not present

## 2022-06-29 DIAGNOSIS — M9902 Segmental and somatic dysfunction of thoracic region: Secondary | ICD-10-CM | POA: Diagnosis not present

## 2022-06-29 DIAGNOSIS — E119 Type 2 diabetes mellitus without complications: Secondary | ICD-10-CM | POA: Diagnosis not present

## 2022-06-29 DIAGNOSIS — S233XXA Sprain of ligaments of thoracic spine, initial encounter: Secondary | ICD-10-CM | POA: Diagnosis not present

## 2022-06-29 DIAGNOSIS — S134XXA Sprain of ligaments of cervical spine, initial encounter: Secondary | ICD-10-CM | POA: Diagnosis not present

## 2022-06-29 DIAGNOSIS — M9901 Segmental and somatic dysfunction of cervical region: Secondary | ICD-10-CM | POA: Diagnosis not present

## 2022-06-29 DIAGNOSIS — S338XXA Sprain of other parts of lumbar spine and pelvis, initial encounter: Secondary | ICD-10-CM | POA: Diagnosis not present

## 2022-06-29 DIAGNOSIS — M9903 Segmental and somatic dysfunction of lumbar region: Secondary | ICD-10-CM | POA: Diagnosis not present

## 2022-07-02 DIAGNOSIS — R109 Unspecified abdominal pain: Secondary | ICD-10-CM | POA: Diagnosis not present

## 2022-07-02 DIAGNOSIS — F319 Bipolar disorder, unspecified: Secondary | ICD-10-CM | POA: Diagnosis not present

## 2022-07-02 DIAGNOSIS — M9903 Segmental and somatic dysfunction of lumbar region: Secondary | ICD-10-CM | POA: Diagnosis not present

## 2022-07-02 DIAGNOSIS — S233XXA Sprain of ligaments of thoracic spine, initial encounter: Secondary | ICD-10-CM | POA: Diagnosis not present

## 2022-07-02 DIAGNOSIS — E1165 Type 2 diabetes mellitus with hyperglycemia: Secondary | ICD-10-CM | POA: Diagnosis not present

## 2022-07-02 DIAGNOSIS — Z299 Encounter for prophylactic measures, unspecified: Secondary | ICD-10-CM | POA: Diagnosis not present

## 2022-07-02 DIAGNOSIS — Z6830 Body mass index (BMI) 30.0-30.9, adult: Secondary | ICD-10-CM | POA: Diagnosis not present

## 2022-07-02 DIAGNOSIS — M9901 Segmental and somatic dysfunction of cervical region: Secondary | ICD-10-CM | POA: Diagnosis not present

## 2022-07-02 DIAGNOSIS — M9902 Segmental and somatic dysfunction of thoracic region: Secondary | ICD-10-CM | POA: Diagnosis not present

## 2022-07-02 DIAGNOSIS — S338XXA Sprain of other parts of lumbar spine and pelvis, initial encounter: Secondary | ICD-10-CM | POA: Diagnosis not present

## 2022-07-02 DIAGNOSIS — S134XXA Sprain of ligaments of cervical spine, initial encounter: Secondary | ICD-10-CM | POA: Diagnosis not present

## 2022-07-02 DIAGNOSIS — I10 Essential (primary) hypertension: Secondary | ICD-10-CM | POA: Diagnosis not present

## 2022-07-06 DIAGNOSIS — M9901 Segmental and somatic dysfunction of cervical region: Secondary | ICD-10-CM | POA: Diagnosis not present

## 2022-07-06 DIAGNOSIS — S134XXA Sprain of ligaments of cervical spine, initial encounter: Secondary | ICD-10-CM | POA: Diagnosis not present

## 2022-07-06 DIAGNOSIS — S338XXA Sprain of other parts of lumbar spine and pelvis, initial encounter: Secondary | ICD-10-CM | POA: Diagnosis not present

## 2022-07-06 DIAGNOSIS — M9903 Segmental and somatic dysfunction of lumbar region: Secondary | ICD-10-CM | POA: Diagnosis not present

## 2022-07-06 DIAGNOSIS — M9902 Segmental and somatic dysfunction of thoracic region: Secondary | ICD-10-CM | POA: Diagnosis not present

## 2022-07-06 DIAGNOSIS — S233XXA Sprain of ligaments of thoracic spine, initial encounter: Secondary | ICD-10-CM | POA: Diagnosis not present

## 2022-07-08 DIAGNOSIS — S134XXA Sprain of ligaments of cervical spine, initial encounter: Secondary | ICD-10-CM | POA: Diagnosis not present

## 2022-07-08 DIAGNOSIS — S233XXA Sprain of ligaments of thoracic spine, initial encounter: Secondary | ICD-10-CM | POA: Diagnosis not present

## 2022-07-08 DIAGNOSIS — M9902 Segmental and somatic dysfunction of thoracic region: Secondary | ICD-10-CM | POA: Diagnosis not present

## 2022-07-08 DIAGNOSIS — M9903 Segmental and somatic dysfunction of lumbar region: Secondary | ICD-10-CM | POA: Diagnosis not present

## 2022-07-08 DIAGNOSIS — S338XXA Sprain of other parts of lumbar spine and pelvis, initial encounter: Secondary | ICD-10-CM | POA: Diagnosis not present

## 2022-07-08 DIAGNOSIS — M9901 Segmental and somatic dysfunction of cervical region: Secondary | ICD-10-CM | POA: Diagnosis not present

## 2022-07-10 DIAGNOSIS — M9903 Segmental and somatic dysfunction of lumbar region: Secondary | ICD-10-CM | POA: Diagnosis not present

## 2022-07-10 DIAGNOSIS — S134XXA Sprain of ligaments of cervical spine, initial encounter: Secondary | ICD-10-CM | POA: Diagnosis not present

## 2022-07-10 DIAGNOSIS — M9901 Segmental and somatic dysfunction of cervical region: Secondary | ICD-10-CM | POA: Diagnosis not present

## 2022-07-10 DIAGNOSIS — M9902 Segmental and somatic dysfunction of thoracic region: Secondary | ICD-10-CM | POA: Diagnosis not present

## 2022-07-10 DIAGNOSIS — S338XXA Sprain of other parts of lumbar spine and pelvis, initial encounter: Secondary | ICD-10-CM | POA: Diagnosis not present

## 2022-07-10 DIAGNOSIS — S233XXA Sprain of ligaments of thoracic spine, initial encounter: Secondary | ICD-10-CM | POA: Diagnosis not present

## 2022-07-13 DIAGNOSIS — S134XXA Sprain of ligaments of cervical spine, initial encounter: Secondary | ICD-10-CM | POA: Diagnosis not present

## 2022-07-13 DIAGNOSIS — M9903 Segmental and somatic dysfunction of lumbar region: Secondary | ICD-10-CM | POA: Diagnosis not present

## 2022-07-13 DIAGNOSIS — M9902 Segmental and somatic dysfunction of thoracic region: Secondary | ICD-10-CM | POA: Diagnosis not present

## 2022-07-13 DIAGNOSIS — S233XXA Sprain of ligaments of thoracic spine, initial encounter: Secondary | ICD-10-CM | POA: Diagnosis not present

## 2022-07-13 DIAGNOSIS — M9901 Segmental and somatic dysfunction of cervical region: Secondary | ICD-10-CM | POA: Diagnosis not present

## 2022-07-13 DIAGNOSIS — S338XXA Sprain of other parts of lumbar spine and pelvis, initial encounter: Secondary | ICD-10-CM | POA: Diagnosis not present

## 2022-07-14 DIAGNOSIS — I10 Essential (primary) hypertension: Secondary | ICD-10-CM | POA: Diagnosis not present

## 2022-07-14 DIAGNOSIS — Z299 Encounter for prophylactic measures, unspecified: Secondary | ICD-10-CM | POA: Diagnosis not present

## 2022-07-14 DIAGNOSIS — E1165 Type 2 diabetes mellitus with hyperglycemia: Secondary | ICD-10-CM | POA: Diagnosis not present

## 2022-07-14 DIAGNOSIS — M549 Dorsalgia, unspecified: Secondary | ICD-10-CM | POA: Diagnosis not present

## 2022-07-14 DIAGNOSIS — K529 Noninfective gastroenteritis and colitis, unspecified: Secondary | ICD-10-CM | POA: Diagnosis not present

## 2022-07-15 DIAGNOSIS — S134XXA Sprain of ligaments of cervical spine, initial encounter: Secondary | ICD-10-CM | POA: Diagnosis not present

## 2022-07-15 DIAGNOSIS — S233XXA Sprain of ligaments of thoracic spine, initial encounter: Secondary | ICD-10-CM | POA: Diagnosis not present

## 2022-07-15 DIAGNOSIS — S338XXA Sprain of other parts of lumbar spine and pelvis, initial encounter: Secondary | ICD-10-CM | POA: Diagnosis not present

## 2022-07-15 DIAGNOSIS — M9903 Segmental and somatic dysfunction of lumbar region: Secondary | ICD-10-CM | POA: Diagnosis not present

## 2022-07-15 DIAGNOSIS — M9901 Segmental and somatic dysfunction of cervical region: Secondary | ICD-10-CM | POA: Diagnosis not present

## 2022-07-15 DIAGNOSIS — M9902 Segmental and somatic dysfunction of thoracic region: Secondary | ICD-10-CM | POA: Diagnosis not present

## 2022-07-20 ENCOUNTER — Ambulatory Visit: Payer: Medicare PPO | Admitting: Urology

## 2022-07-20 DIAGNOSIS — I639 Cerebral infarction, unspecified: Secondary | ICD-10-CM | POA: Diagnosis not present

## 2022-07-20 DIAGNOSIS — Z7982 Long term (current) use of aspirin: Secondary | ICD-10-CM | POA: Diagnosis not present

## 2022-07-20 DIAGNOSIS — K219 Gastro-esophageal reflux disease without esophagitis: Secondary | ICD-10-CM | POA: Diagnosis not present

## 2022-07-20 DIAGNOSIS — Z880 Allergy status to penicillin: Secondary | ICD-10-CM | POA: Diagnosis not present

## 2022-07-20 DIAGNOSIS — R29810 Facial weakness: Secondary | ICD-10-CM | POA: Diagnosis not present

## 2022-07-20 DIAGNOSIS — E119 Type 2 diabetes mellitus without complications: Secondary | ICD-10-CM | POA: Diagnosis not present

## 2022-07-20 DIAGNOSIS — R4781 Slurred speech: Secondary | ICD-10-CM | POA: Diagnosis not present

## 2022-07-20 DIAGNOSIS — G510 Bell's palsy: Secondary | ICD-10-CM | POA: Diagnosis not present

## 2022-07-20 DIAGNOSIS — I10 Essential (primary) hypertension: Secondary | ICD-10-CM | POA: Diagnosis not present

## 2022-07-20 DIAGNOSIS — G540 Brachial plexus disorders: Secondary | ICD-10-CM | POA: Diagnosis not present

## 2022-07-20 DIAGNOSIS — Z955 Presence of coronary angioplasty implant and graft: Secondary | ICD-10-CM | POA: Diagnosis not present

## 2022-07-20 DIAGNOSIS — I2510 Atherosclerotic heart disease of native coronary artery without angina pectoris: Secondary | ICD-10-CM | POA: Diagnosis not present

## 2022-07-20 DIAGNOSIS — F319 Bipolar disorder, unspecified: Secondary | ICD-10-CM | POA: Diagnosis not present

## 2022-07-20 DIAGNOSIS — Z8673 Personal history of transient ischemic attack (TIA), and cerebral infarction without residual deficits: Secondary | ICD-10-CM | POA: Diagnosis not present

## 2022-07-21 DIAGNOSIS — E119 Type 2 diabetes mellitus without complications: Secondary | ICD-10-CM | POA: Diagnosis not present

## 2022-07-21 DIAGNOSIS — Z955 Presence of coronary angioplasty implant and graft: Secondary | ICD-10-CM | POA: Diagnosis not present

## 2022-07-21 DIAGNOSIS — G510 Bell's palsy: Secondary | ICD-10-CM | POA: Diagnosis not present

## 2022-07-21 DIAGNOSIS — E782 Mixed hyperlipidemia: Secondary | ICD-10-CM | POA: Diagnosis not present

## 2022-07-21 DIAGNOSIS — I2510 Atherosclerotic heart disease of native coronary artery without angina pectoris: Secondary | ICD-10-CM | POA: Diagnosis not present

## 2022-07-21 DIAGNOSIS — Z7982 Long term (current) use of aspirin: Secondary | ICD-10-CM | POA: Diagnosis not present

## 2022-07-21 DIAGNOSIS — E785 Hyperlipidemia, unspecified: Secondary | ICD-10-CM | POA: Diagnosis not present

## 2022-07-21 DIAGNOSIS — Z7984 Long term (current) use of oral hypoglycemic drugs: Secondary | ICD-10-CM | POA: Diagnosis not present

## 2022-07-21 DIAGNOSIS — Z79899 Other long term (current) drug therapy: Secondary | ICD-10-CM | POA: Diagnosis not present

## 2022-07-21 DIAGNOSIS — I10 Essential (primary) hypertension: Secondary | ICD-10-CM | POA: Diagnosis not present

## 2022-07-21 DIAGNOSIS — F419 Anxiety disorder, unspecified: Secondary | ICD-10-CM | POA: Diagnosis not present

## 2022-07-21 DIAGNOSIS — G8101 Flaccid hemiplegia affecting right dominant side: Secondary | ICD-10-CM | POA: Diagnosis not present

## 2022-07-22 DIAGNOSIS — Z955 Presence of coronary angioplasty implant and graft: Secondary | ICD-10-CM | POA: Diagnosis not present

## 2022-07-22 DIAGNOSIS — I10 Essential (primary) hypertension: Secondary | ICD-10-CM | POA: Diagnosis not present

## 2022-07-22 DIAGNOSIS — E119 Type 2 diabetes mellitus without complications: Secondary | ICD-10-CM | POA: Diagnosis not present

## 2022-07-22 DIAGNOSIS — I2510 Atherosclerotic heart disease of native coronary artery without angina pectoris: Secondary | ICD-10-CM | POA: Diagnosis not present

## 2022-07-22 DIAGNOSIS — Z79899 Other long term (current) drug therapy: Secondary | ICD-10-CM | POA: Diagnosis not present

## 2022-07-22 DIAGNOSIS — E782 Mixed hyperlipidemia: Secondary | ICD-10-CM | POA: Diagnosis not present

## 2022-07-22 DIAGNOSIS — Z7984 Long term (current) use of oral hypoglycemic drugs: Secondary | ICD-10-CM | POA: Diagnosis not present

## 2022-07-22 DIAGNOSIS — F419 Anxiety disorder, unspecified: Secondary | ICD-10-CM | POA: Diagnosis not present

## 2022-07-22 DIAGNOSIS — G8101 Flaccid hemiplegia affecting right dominant side: Secondary | ICD-10-CM | POA: Diagnosis not present

## 2022-07-22 DIAGNOSIS — G510 Bell's palsy: Secondary | ICD-10-CM | POA: Diagnosis not present

## 2022-07-22 DIAGNOSIS — Z794 Long term (current) use of insulin: Secondary | ICD-10-CM | POA: Diagnosis not present

## 2022-07-22 DIAGNOSIS — R0602 Shortness of breath: Secondary | ICD-10-CM | POA: Diagnosis not present

## 2022-07-24 DIAGNOSIS — G510 Bell's palsy: Secondary | ICD-10-CM | POA: Diagnosis not present

## 2022-07-27 DIAGNOSIS — Z299 Encounter for prophylactic measures, unspecified: Secondary | ICD-10-CM | POA: Diagnosis not present

## 2022-07-27 DIAGNOSIS — G510 Bell's palsy: Secondary | ICD-10-CM | POA: Diagnosis not present

## 2022-07-27 DIAGNOSIS — F319 Bipolar disorder, unspecified: Secondary | ICD-10-CM | POA: Diagnosis not present

## 2022-07-27 DIAGNOSIS — I10 Essential (primary) hypertension: Secondary | ICD-10-CM | POA: Diagnosis not present

## 2022-07-28 DIAGNOSIS — Z96653 Presence of artificial knee joint, bilateral: Secondary | ICD-10-CM | POA: Diagnosis not present

## 2022-07-28 DIAGNOSIS — I10 Essential (primary) hypertension: Secondary | ICD-10-CM | POA: Diagnosis not present

## 2022-07-28 DIAGNOSIS — K219 Gastro-esophageal reflux disease without esophagitis: Secondary | ICD-10-CM | POA: Diagnosis not present

## 2022-07-28 DIAGNOSIS — I2510 Atherosclerotic heart disease of native coronary artery without angina pectoris: Secondary | ICD-10-CM | POA: Diagnosis not present

## 2022-07-28 DIAGNOSIS — E119 Type 2 diabetes mellitus without complications: Secondary | ICD-10-CM | POA: Diagnosis not present

## 2022-07-28 DIAGNOSIS — G510 Bell's palsy: Secondary | ICD-10-CM | POA: Diagnosis not present

## 2022-07-28 DIAGNOSIS — F319 Bipolar disorder, unspecified: Secondary | ICD-10-CM | POA: Diagnosis not present

## 2022-07-28 DIAGNOSIS — E782 Mixed hyperlipidemia: Secondary | ICD-10-CM | POA: Diagnosis not present

## 2022-07-28 DIAGNOSIS — I252 Old myocardial infarction: Secondary | ICD-10-CM | POA: Diagnosis not present

## 2022-07-29 DIAGNOSIS — E119 Type 2 diabetes mellitus without complications: Secondary | ICD-10-CM | POA: Diagnosis not present

## 2022-07-31 DIAGNOSIS — I2510 Atherosclerotic heart disease of native coronary artery without angina pectoris: Secondary | ICD-10-CM | POA: Diagnosis not present

## 2022-07-31 DIAGNOSIS — F319 Bipolar disorder, unspecified: Secondary | ICD-10-CM | POA: Diagnosis not present

## 2022-07-31 DIAGNOSIS — Z96653 Presence of artificial knee joint, bilateral: Secondary | ICD-10-CM | POA: Diagnosis not present

## 2022-07-31 DIAGNOSIS — E119 Type 2 diabetes mellitus without complications: Secondary | ICD-10-CM | POA: Diagnosis not present

## 2022-07-31 DIAGNOSIS — K219 Gastro-esophageal reflux disease without esophagitis: Secondary | ICD-10-CM | POA: Diagnosis not present

## 2022-07-31 DIAGNOSIS — I10 Essential (primary) hypertension: Secondary | ICD-10-CM | POA: Diagnosis not present

## 2022-07-31 DIAGNOSIS — E782 Mixed hyperlipidemia: Secondary | ICD-10-CM | POA: Diagnosis not present

## 2022-07-31 DIAGNOSIS — G510 Bell's palsy: Secondary | ICD-10-CM | POA: Diagnosis not present

## 2022-07-31 DIAGNOSIS — I252 Old myocardial infarction: Secondary | ICD-10-CM | POA: Diagnosis not present

## 2022-08-04 DIAGNOSIS — I252 Old myocardial infarction: Secondary | ICD-10-CM | POA: Diagnosis not present

## 2022-08-04 DIAGNOSIS — G510 Bell's palsy: Secondary | ICD-10-CM | POA: Diagnosis not present

## 2022-08-04 DIAGNOSIS — E782 Mixed hyperlipidemia: Secondary | ICD-10-CM | POA: Diagnosis not present

## 2022-08-04 DIAGNOSIS — Z96653 Presence of artificial knee joint, bilateral: Secondary | ICD-10-CM | POA: Diagnosis not present

## 2022-08-04 DIAGNOSIS — K219 Gastro-esophageal reflux disease without esophagitis: Secondary | ICD-10-CM | POA: Diagnosis not present

## 2022-08-04 DIAGNOSIS — I2510 Atherosclerotic heart disease of native coronary artery without angina pectoris: Secondary | ICD-10-CM | POA: Diagnosis not present

## 2022-08-04 DIAGNOSIS — F319 Bipolar disorder, unspecified: Secondary | ICD-10-CM | POA: Diagnosis not present

## 2022-08-04 DIAGNOSIS — I10 Essential (primary) hypertension: Secondary | ICD-10-CM | POA: Diagnosis not present

## 2022-08-04 DIAGNOSIS — E119 Type 2 diabetes mellitus without complications: Secondary | ICD-10-CM | POA: Diagnosis not present

## 2022-08-06 DIAGNOSIS — E782 Mixed hyperlipidemia: Secondary | ICD-10-CM | POA: Diagnosis not present

## 2022-08-06 DIAGNOSIS — F319 Bipolar disorder, unspecified: Secondary | ICD-10-CM | POA: Diagnosis not present

## 2022-08-06 DIAGNOSIS — Z96653 Presence of artificial knee joint, bilateral: Secondary | ICD-10-CM | POA: Diagnosis not present

## 2022-08-06 DIAGNOSIS — K219 Gastro-esophageal reflux disease without esophagitis: Secondary | ICD-10-CM | POA: Diagnosis not present

## 2022-08-06 DIAGNOSIS — I2510 Atherosclerotic heart disease of native coronary artery without angina pectoris: Secondary | ICD-10-CM | POA: Diagnosis not present

## 2022-08-06 DIAGNOSIS — E119 Type 2 diabetes mellitus without complications: Secondary | ICD-10-CM | POA: Diagnosis not present

## 2022-08-06 DIAGNOSIS — G510 Bell's palsy: Secondary | ICD-10-CM | POA: Diagnosis not present

## 2022-08-06 DIAGNOSIS — I252 Old myocardial infarction: Secondary | ICD-10-CM | POA: Diagnosis not present

## 2022-08-06 DIAGNOSIS — I10 Essential (primary) hypertension: Secondary | ICD-10-CM | POA: Diagnosis not present

## 2022-08-10 DIAGNOSIS — Z6829 Body mass index (BMI) 29.0-29.9, adult: Secondary | ICD-10-CM | POA: Diagnosis not present

## 2022-08-10 DIAGNOSIS — G510 Bell's palsy: Secondary | ICD-10-CM | POA: Diagnosis not present

## 2022-08-10 DIAGNOSIS — I10 Essential (primary) hypertension: Secondary | ICD-10-CM | POA: Diagnosis not present

## 2022-08-10 DIAGNOSIS — E1165 Type 2 diabetes mellitus with hyperglycemia: Secondary | ICD-10-CM | POA: Diagnosis not present

## 2022-08-10 DIAGNOSIS — Z299 Encounter for prophylactic measures, unspecified: Secondary | ICD-10-CM | POA: Diagnosis not present

## 2022-08-11 DIAGNOSIS — I252 Old myocardial infarction: Secondary | ICD-10-CM | POA: Diagnosis not present

## 2022-08-11 DIAGNOSIS — G510 Bell's palsy: Secondary | ICD-10-CM | POA: Diagnosis not present

## 2022-08-11 DIAGNOSIS — F319 Bipolar disorder, unspecified: Secondary | ICD-10-CM | POA: Diagnosis not present

## 2022-08-11 DIAGNOSIS — I10 Essential (primary) hypertension: Secondary | ICD-10-CM | POA: Diagnosis not present

## 2022-08-11 DIAGNOSIS — E119 Type 2 diabetes mellitus without complications: Secondary | ICD-10-CM | POA: Diagnosis not present

## 2022-08-11 DIAGNOSIS — I2510 Atherosclerotic heart disease of native coronary artery without angina pectoris: Secondary | ICD-10-CM | POA: Diagnosis not present

## 2022-08-11 DIAGNOSIS — Z96653 Presence of artificial knee joint, bilateral: Secondary | ICD-10-CM | POA: Diagnosis not present

## 2022-08-11 DIAGNOSIS — E782 Mixed hyperlipidemia: Secondary | ICD-10-CM | POA: Diagnosis not present

## 2022-08-11 DIAGNOSIS — K219 Gastro-esophageal reflux disease without esophagitis: Secondary | ICD-10-CM | POA: Diagnosis not present

## 2022-08-12 DIAGNOSIS — E782 Mixed hyperlipidemia: Secondary | ICD-10-CM | POA: Diagnosis not present

## 2022-08-12 DIAGNOSIS — G510 Bell's palsy: Secondary | ICD-10-CM | POA: Diagnosis not present

## 2022-08-12 DIAGNOSIS — Z96653 Presence of artificial knee joint, bilateral: Secondary | ICD-10-CM | POA: Diagnosis not present

## 2022-08-12 DIAGNOSIS — K219 Gastro-esophageal reflux disease without esophagitis: Secondary | ICD-10-CM | POA: Diagnosis not present

## 2022-08-12 DIAGNOSIS — I10 Essential (primary) hypertension: Secondary | ICD-10-CM | POA: Diagnosis not present

## 2022-08-12 DIAGNOSIS — E119 Type 2 diabetes mellitus without complications: Secondary | ICD-10-CM | POA: Diagnosis not present

## 2022-08-12 DIAGNOSIS — I2510 Atherosclerotic heart disease of native coronary artery without angina pectoris: Secondary | ICD-10-CM | POA: Diagnosis not present

## 2022-08-12 DIAGNOSIS — F319 Bipolar disorder, unspecified: Secondary | ICD-10-CM | POA: Diagnosis not present

## 2022-08-12 DIAGNOSIS — I252 Old myocardial infarction: Secondary | ICD-10-CM | POA: Diagnosis not present

## 2022-08-13 DIAGNOSIS — I252 Old myocardial infarction: Secondary | ICD-10-CM | POA: Diagnosis not present

## 2022-08-13 DIAGNOSIS — K219 Gastro-esophageal reflux disease without esophagitis: Secondary | ICD-10-CM | POA: Diagnosis not present

## 2022-08-13 DIAGNOSIS — I10 Essential (primary) hypertension: Secondary | ICD-10-CM | POA: Diagnosis not present

## 2022-08-13 DIAGNOSIS — I2510 Atherosclerotic heart disease of native coronary artery without angina pectoris: Secondary | ICD-10-CM | POA: Diagnosis not present

## 2022-08-13 DIAGNOSIS — F319 Bipolar disorder, unspecified: Secondary | ICD-10-CM | POA: Diagnosis not present

## 2022-08-13 DIAGNOSIS — E782 Mixed hyperlipidemia: Secondary | ICD-10-CM | POA: Diagnosis not present

## 2022-08-13 DIAGNOSIS — E119 Type 2 diabetes mellitus without complications: Secondary | ICD-10-CM | POA: Diagnosis not present

## 2022-08-13 DIAGNOSIS — G510 Bell's palsy: Secondary | ICD-10-CM | POA: Diagnosis not present

## 2022-08-13 DIAGNOSIS — Z96653 Presence of artificial knee joint, bilateral: Secondary | ICD-10-CM | POA: Diagnosis not present

## 2022-08-17 DIAGNOSIS — I252 Old myocardial infarction: Secondary | ICD-10-CM | POA: Diagnosis not present

## 2022-08-17 DIAGNOSIS — F319 Bipolar disorder, unspecified: Secondary | ICD-10-CM | POA: Diagnosis not present

## 2022-08-17 DIAGNOSIS — I2510 Atherosclerotic heart disease of native coronary artery without angina pectoris: Secondary | ICD-10-CM | POA: Diagnosis not present

## 2022-08-17 DIAGNOSIS — E119 Type 2 diabetes mellitus without complications: Secondary | ICD-10-CM | POA: Diagnosis not present

## 2022-08-17 DIAGNOSIS — I10 Essential (primary) hypertension: Secondary | ICD-10-CM | POA: Diagnosis not present

## 2022-08-17 DIAGNOSIS — Z96653 Presence of artificial knee joint, bilateral: Secondary | ICD-10-CM | POA: Diagnosis not present

## 2022-08-17 DIAGNOSIS — E782 Mixed hyperlipidemia: Secondary | ICD-10-CM | POA: Diagnosis not present

## 2022-08-17 DIAGNOSIS — K219 Gastro-esophageal reflux disease without esophagitis: Secondary | ICD-10-CM | POA: Diagnosis not present

## 2022-08-17 DIAGNOSIS — G510 Bell's palsy: Secondary | ICD-10-CM | POA: Diagnosis not present

## 2022-08-19 DIAGNOSIS — I2510 Atherosclerotic heart disease of native coronary artery without angina pectoris: Secondary | ICD-10-CM | POA: Diagnosis not present

## 2022-08-19 DIAGNOSIS — E119 Type 2 diabetes mellitus without complications: Secondary | ICD-10-CM | POA: Diagnosis not present

## 2022-08-19 DIAGNOSIS — G510 Bell's palsy: Secondary | ICD-10-CM | POA: Diagnosis not present

## 2022-08-19 DIAGNOSIS — I10 Essential (primary) hypertension: Secondary | ICD-10-CM | POA: Diagnosis not present

## 2022-08-20 DIAGNOSIS — I2510 Atherosclerotic heart disease of native coronary artery without angina pectoris: Secondary | ICD-10-CM | POA: Diagnosis not present

## 2022-08-20 DIAGNOSIS — F319 Bipolar disorder, unspecified: Secondary | ICD-10-CM | POA: Diagnosis not present

## 2022-08-20 DIAGNOSIS — Z96653 Presence of artificial knee joint, bilateral: Secondary | ICD-10-CM | POA: Diagnosis not present

## 2022-08-20 DIAGNOSIS — K219 Gastro-esophageal reflux disease without esophagitis: Secondary | ICD-10-CM | POA: Diagnosis not present

## 2022-08-20 DIAGNOSIS — E119 Type 2 diabetes mellitus without complications: Secondary | ICD-10-CM | POA: Diagnosis not present

## 2022-08-20 DIAGNOSIS — G510 Bell's palsy: Secondary | ICD-10-CM | POA: Diagnosis not present

## 2022-08-20 DIAGNOSIS — I252 Old myocardial infarction: Secondary | ICD-10-CM | POA: Diagnosis not present

## 2022-08-20 DIAGNOSIS — E782 Mixed hyperlipidemia: Secondary | ICD-10-CM | POA: Diagnosis not present

## 2022-08-20 DIAGNOSIS — I10 Essential (primary) hypertension: Secondary | ICD-10-CM | POA: Diagnosis not present

## 2022-08-26 DIAGNOSIS — E1165 Type 2 diabetes mellitus with hyperglycemia: Secondary | ICD-10-CM | POA: Diagnosis not present

## 2022-08-26 DIAGNOSIS — Z299 Encounter for prophylactic measures, unspecified: Secondary | ICD-10-CM | POA: Diagnosis not present

## 2022-08-26 DIAGNOSIS — I10 Essential (primary) hypertension: Secondary | ICD-10-CM | POA: Diagnosis not present

## 2022-08-27 DIAGNOSIS — I2510 Atherosclerotic heart disease of native coronary artery without angina pectoris: Secondary | ICD-10-CM | POA: Diagnosis not present

## 2022-08-27 DIAGNOSIS — K219 Gastro-esophageal reflux disease without esophagitis: Secondary | ICD-10-CM | POA: Diagnosis not present

## 2022-08-27 DIAGNOSIS — I10 Essential (primary) hypertension: Secondary | ICD-10-CM | POA: Diagnosis not present

## 2022-08-27 DIAGNOSIS — E782 Mixed hyperlipidemia: Secondary | ICD-10-CM | POA: Diagnosis not present

## 2022-08-27 DIAGNOSIS — G510 Bell's palsy: Secondary | ICD-10-CM | POA: Diagnosis not present

## 2022-08-27 DIAGNOSIS — F319 Bipolar disorder, unspecified: Secondary | ICD-10-CM | POA: Diagnosis not present

## 2022-08-27 DIAGNOSIS — Z96653 Presence of artificial knee joint, bilateral: Secondary | ICD-10-CM | POA: Diagnosis not present

## 2022-08-27 DIAGNOSIS — E119 Type 2 diabetes mellitus without complications: Secondary | ICD-10-CM | POA: Diagnosis not present

## 2022-08-27 DIAGNOSIS — I252 Old myocardial infarction: Secondary | ICD-10-CM | POA: Diagnosis not present

## 2022-08-28 DIAGNOSIS — I10 Essential (primary) hypertension: Secondary | ICD-10-CM | POA: Diagnosis not present

## 2022-08-28 DIAGNOSIS — K219 Gastro-esophageal reflux disease without esophagitis: Secondary | ICD-10-CM | POA: Diagnosis not present

## 2022-08-28 DIAGNOSIS — I252 Old myocardial infarction: Secondary | ICD-10-CM | POA: Diagnosis not present

## 2022-08-28 DIAGNOSIS — Z96653 Presence of artificial knee joint, bilateral: Secondary | ICD-10-CM | POA: Diagnosis not present

## 2022-08-28 DIAGNOSIS — G510 Bell's palsy: Secondary | ICD-10-CM | POA: Diagnosis not present

## 2022-08-28 DIAGNOSIS — I2510 Atherosclerotic heart disease of native coronary artery without angina pectoris: Secondary | ICD-10-CM | POA: Diagnosis not present

## 2022-08-28 DIAGNOSIS — F319 Bipolar disorder, unspecified: Secondary | ICD-10-CM | POA: Diagnosis not present

## 2022-08-28 DIAGNOSIS — E119 Type 2 diabetes mellitus without complications: Secondary | ICD-10-CM | POA: Diagnosis not present

## 2022-08-28 DIAGNOSIS — E782 Mixed hyperlipidemia: Secondary | ICD-10-CM | POA: Diagnosis not present

## 2022-08-29 DIAGNOSIS — E119 Type 2 diabetes mellitus without complications: Secondary | ICD-10-CM | POA: Diagnosis not present

## 2022-09-04 DIAGNOSIS — Z96653 Presence of artificial knee joint, bilateral: Secondary | ICD-10-CM | POA: Diagnosis not present

## 2022-09-04 DIAGNOSIS — E782 Mixed hyperlipidemia: Secondary | ICD-10-CM | POA: Diagnosis not present

## 2022-09-04 DIAGNOSIS — M25551 Pain in right hip: Secondary | ICD-10-CM | POA: Diagnosis not present

## 2022-09-04 DIAGNOSIS — I252 Old myocardial infarction: Secondary | ICD-10-CM | POA: Diagnosis not present

## 2022-09-04 DIAGNOSIS — L89309 Pressure ulcer of unspecified buttock, unspecified stage: Secondary | ICD-10-CM | POA: Diagnosis not present

## 2022-09-04 DIAGNOSIS — Z299 Encounter for prophylactic measures, unspecified: Secondary | ICD-10-CM | POA: Diagnosis not present

## 2022-09-04 DIAGNOSIS — F319 Bipolar disorder, unspecified: Secondary | ICD-10-CM | POA: Diagnosis not present

## 2022-09-04 DIAGNOSIS — I10 Essential (primary) hypertension: Secondary | ICD-10-CM | POA: Diagnosis not present

## 2022-09-04 DIAGNOSIS — E119 Type 2 diabetes mellitus without complications: Secondary | ICD-10-CM | POA: Diagnosis not present

## 2022-09-04 DIAGNOSIS — G510 Bell's palsy: Secondary | ICD-10-CM | POA: Diagnosis not present

## 2022-09-04 DIAGNOSIS — K219 Gastro-esophageal reflux disease without esophagitis: Secondary | ICD-10-CM | POA: Diagnosis not present

## 2022-09-04 DIAGNOSIS — I2510 Atherosclerotic heart disease of native coronary artery without angina pectoris: Secondary | ICD-10-CM | POA: Diagnosis not present

## 2022-09-09 DIAGNOSIS — E119 Type 2 diabetes mellitus without complications: Secondary | ICD-10-CM | POA: Diagnosis not present

## 2022-09-09 DIAGNOSIS — G510 Bell's palsy: Secondary | ICD-10-CM | POA: Diagnosis not present

## 2022-09-09 DIAGNOSIS — F319 Bipolar disorder, unspecified: Secondary | ICD-10-CM | POA: Diagnosis not present

## 2022-09-09 DIAGNOSIS — E782 Mixed hyperlipidemia: Secondary | ICD-10-CM | POA: Diagnosis not present

## 2022-09-09 DIAGNOSIS — K219 Gastro-esophageal reflux disease without esophagitis: Secondary | ICD-10-CM | POA: Diagnosis not present

## 2022-09-09 DIAGNOSIS — I2510 Atherosclerotic heart disease of native coronary artery without angina pectoris: Secondary | ICD-10-CM | POA: Diagnosis not present

## 2022-09-09 DIAGNOSIS — Z96653 Presence of artificial knee joint, bilateral: Secondary | ICD-10-CM | POA: Diagnosis not present

## 2022-09-09 DIAGNOSIS — I10 Essential (primary) hypertension: Secondary | ICD-10-CM | POA: Diagnosis not present

## 2022-09-09 DIAGNOSIS — I252 Old myocardial infarction: Secondary | ICD-10-CM | POA: Diagnosis not present

## 2022-09-14 DIAGNOSIS — Z7189 Other specified counseling: Secondary | ICD-10-CM | POA: Diagnosis not present

## 2022-09-14 DIAGNOSIS — I10 Essential (primary) hypertension: Secondary | ICD-10-CM | POA: Diagnosis not present

## 2022-09-14 DIAGNOSIS — Z0000 Encounter for general adult medical examination without abnormal findings: Secondary | ICD-10-CM | POA: Diagnosis not present

## 2022-09-14 DIAGNOSIS — F319 Bipolar disorder, unspecified: Secondary | ICD-10-CM | POA: Diagnosis not present

## 2022-09-14 DIAGNOSIS — Z299 Encounter for prophylactic measures, unspecified: Secondary | ICD-10-CM | POA: Diagnosis not present

## 2022-09-14 DIAGNOSIS — Z1331 Encounter for screening for depression: Secondary | ICD-10-CM | POA: Diagnosis not present

## 2022-09-14 DIAGNOSIS — Z1339 Encounter for screening examination for other mental health and behavioral disorders: Secondary | ICD-10-CM | POA: Diagnosis not present

## 2022-09-14 DIAGNOSIS — F339 Major depressive disorder, recurrent, unspecified: Secondary | ICD-10-CM | POA: Diagnosis not present

## 2022-09-15 DIAGNOSIS — M25561 Pain in right knee: Secondary | ICD-10-CM | POA: Diagnosis not present

## 2022-09-16 DIAGNOSIS — I252 Old myocardial infarction: Secondary | ICD-10-CM | POA: Diagnosis not present

## 2022-09-16 DIAGNOSIS — E782 Mixed hyperlipidemia: Secondary | ICD-10-CM | POA: Diagnosis not present

## 2022-09-16 DIAGNOSIS — E119 Type 2 diabetes mellitus without complications: Secondary | ICD-10-CM | POA: Diagnosis not present

## 2022-09-16 DIAGNOSIS — I2510 Atherosclerotic heart disease of native coronary artery without angina pectoris: Secondary | ICD-10-CM | POA: Diagnosis not present

## 2022-09-16 DIAGNOSIS — G510 Bell's palsy: Secondary | ICD-10-CM | POA: Diagnosis not present

## 2022-09-16 DIAGNOSIS — K219 Gastro-esophageal reflux disease without esophagitis: Secondary | ICD-10-CM | POA: Diagnosis not present

## 2022-09-16 DIAGNOSIS — I10 Essential (primary) hypertension: Secondary | ICD-10-CM | POA: Diagnosis not present

## 2022-09-16 DIAGNOSIS — Z96653 Presence of artificial knee joint, bilateral: Secondary | ICD-10-CM | POA: Diagnosis not present

## 2022-09-16 DIAGNOSIS — F319 Bipolar disorder, unspecified: Secondary | ICD-10-CM | POA: Diagnosis not present

## 2022-09-22 DIAGNOSIS — Z96653 Presence of artificial knee joint, bilateral: Secondary | ICD-10-CM | POA: Diagnosis not present

## 2022-09-22 DIAGNOSIS — I10 Essential (primary) hypertension: Secondary | ICD-10-CM | POA: Diagnosis not present

## 2022-09-22 DIAGNOSIS — E119 Type 2 diabetes mellitus without complications: Secondary | ICD-10-CM | POA: Diagnosis not present

## 2022-09-22 DIAGNOSIS — G510 Bell's palsy: Secondary | ICD-10-CM | POA: Diagnosis not present

## 2022-09-22 DIAGNOSIS — I252 Old myocardial infarction: Secondary | ICD-10-CM | POA: Diagnosis not present

## 2022-09-22 DIAGNOSIS — I2510 Atherosclerotic heart disease of native coronary artery without angina pectoris: Secondary | ICD-10-CM | POA: Diagnosis not present

## 2022-09-22 DIAGNOSIS — E782 Mixed hyperlipidemia: Secondary | ICD-10-CM | POA: Diagnosis not present

## 2022-09-22 DIAGNOSIS — F319 Bipolar disorder, unspecified: Secondary | ICD-10-CM | POA: Diagnosis not present

## 2022-09-22 DIAGNOSIS — K219 Gastro-esophageal reflux disease without esophagitis: Secondary | ICD-10-CM | POA: Diagnosis not present

## 2022-09-29 DIAGNOSIS — E119 Type 2 diabetes mellitus without complications: Secondary | ICD-10-CM | POA: Diagnosis not present

## 2022-10-30 DIAGNOSIS — E119 Type 2 diabetes mellitus without complications: Secondary | ICD-10-CM | POA: Diagnosis not present

## 2022-11-06 DIAGNOSIS — I10 Essential (primary) hypertension: Secondary | ICD-10-CM | POA: Diagnosis not present

## 2022-11-06 DIAGNOSIS — W19XXXA Unspecified fall, initial encounter: Secondary | ICD-10-CM | POA: Diagnosis not present

## 2022-11-06 DIAGNOSIS — M25551 Pain in right hip: Secondary | ICD-10-CM | POA: Diagnosis not present

## 2022-11-06 DIAGNOSIS — Z299 Encounter for prophylactic measures, unspecified: Secondary | ICD-10-CM | POA: Diagnosis not present

## 2022-11-06 DIAGNOSIS — M25561 Pain in right knee: Secondary | ICD-10-CM | POA: Diagnosis not present

## 2022-11-06 DIAGNOSIS — R52 Pain, unspecified: Secondary | ICD-10-CM | POA: Diagnosis not present

## 2022-11-19 ENCOUNTER — Emergency Department (HOSPITAL_COMMUNITY)
Admission: EM | Admit: 2022-11-19 | Discharge: 2022-11-19 | Disposition: A | Discharge status: Home / Self Care | Payer: Medicare PPO | Source: Home / Self Care | Attending: Emergency Medicine | Admitting: Emergency Medicine

## 2022-11-19 ENCOUNTER — Inpatient Hospital Stay (HOSPITAL_COMMUNITY): Payer: Medicare PPO

## 2022-11-19 ENCOUNTER — Emergency Department (HOSPITAL_COMMUNITY): Payer: Medicare PPO

## 2022-11-19 ENCOUNTER — Other Ambulatory Visit: Payer: Self-pay

## 2022-11-19 ENCOUNTER — Encounter (HOSPITAL_COMMUNITY): Payer: Self-pay

## 2022-11-19 ENCOUNTER — Inpatient Hospital Stay (HOSPITAL_COMMUNITY)
Admission: EM | Admit: 2022-11-19 | Discharge: 2022-11-24 | DRG: 481 | Disposition: A | Discharge status: Skilled Nursing Facility | Payer: Medicare PPO | Attending: Internal Medicine | Admitting: Internal Medicine

## 2022-11-19 DIAGNOSIS — F319 Bipolar disorder, unspecified: Secondary | ICD-10-CM | POA: Diagnosis not present

## 2022-11-19 DIAGNOSIS — I110 Hypertensive heart disease with heart failure: Secondary | ICD-10-CM | POA: Diagnosis present

## 2022-11-19 DIAGNOSIS — H918X1 Other specified hearing loss, right ear: Secondary | ICD-10-CM | POA: Diagnosis present

## 2022-11-19 DIAGNOSIS — W19XXXA Unspecified fall, initial encounter: Secondary | ICD-10-CM | POA: Diagnosis not present

## 2022-11-19 DIAGNOSIS — E782 Mixed hyperlipidemia: Secondary | ICD-10-CM | POA: Diagnosis present

## 2022-11-19 DIAGNOSIS — I10 Essential (primary) hypertension: Secondary | ICD-10-CM | POA: Diagnosis present

## 2022-11-19 DIAGNOSIS — Z8249 Family history of ischemic heart disease and other diseases of the circulatory system: Secondary | ICD-10-CM

## 2022-11-19 DIAGNOSIS — E669 Obesity, unspecified: Secondary | ICD-10-CM | POA: Diagnosis present

## 2022-11-19 DIAGNOSIS — D62 Acute posthemorrhagic anemia: Secondary | ICD-10-CM | POA: Diagnosis not present

## 2022-11-19 DIAGNOSIS — R262 Difficulty in walking, not elsewhere classified: Secondary | ICD-10-CM | POA: Diagnosis present

## 2022-11-19 DIAGNOSIS — R000 Tachycardia, unspecified: Secondary | ICD-10-CM | POA: Diagnosis not present

## 2022-11-19 DIAGNOSIS — Z7984 Long term (current) use of oral hypoglycemic drugs: Secondary | ICD-10-CM | POA: Diagnosis not present

## 2022-11-19 DIAGNOSIS — J449 Chronic obstructive pulmonary disease, unspecified: Secondary | ICD-10-CM | POA: Diagnosis present

## 2022-11-19 DIAGNOSIS — E119 Type 2 diabetes mellitus without complications: Secondary | ICD-10-CM | POA: Diagnosis not present

## 2022-11-19 DIAGNOSIS — D72829 Elevated white blood cell count, unspecified: Secondary | ICD-10-CM | POA: Diagnosis present

## 2022-11-19 DIAGNOSIS — Z9049 Acquired absence of other specified parts of digestive tract: Secondary | ICD-10-CM

## 2022-11-19 DIAGNOSIS — Z79890 Hormone replacement therapy (postmenopausal): Secondary | ICD-10-CM

## 2022-11-19 DIAGNOSIS — Z955 Presence of coronary angioplasty implant and graft: Secondary | ICD-10-CM | POA: Diagnosis not present

## 2022-11-19 DIAGNOSIS — N401 Enlarged prostate with lower urinary tract symptoms: Secondary | ICD-10-CM | POA: Diagnosis present

## 2022-11-19 DIAGNOSIS — F419 Anxiety disorder, unspecified: Secondary | ICD-10-CM | POA: Diagnosis present

## 2022-11-19 DIAGNOSIS — S72451A Displaced supracondylar fracture without intracondylar extension of lower end of right femur, initial encounter for closed fracture: Secondary | ICD-10-CM | POA: Diagnosis not present

## 2022-11-19 DIAGNOSIS — M9711XA Periprosthetic fracture around internal prosthetic right knee joint, initial encounter: Secondary | ICD-10-CM | POA: Diagnosis present

## 2022-11-19 DIAGNOSIS — S8002XA Contusion of left knee, initial encounter: Secondary | ICD-10-CM | POA: Diagnosis present

## 2022-11-19 DIAGNOSIS — K219 Gastro-esophageal reflux disease without esophagitis: Secondary | ICD-10-CM | POA: Diagnosis not present

## 2022-11-19 DIAGNOSIS — S7290XA Unspecified fracture of unspecified femur, initial encounter for closed fracture: Secondary | ICD-10-CM | POA: Diagnosis not present

## 2022-11-19 DIAGNOSIS — Z880 Allergy status to penicillin: Secondary | ICD-10-CM

## 2022-11-19 DIAGNOSIS — Z6832 Body mass index (BMI) 32.0-32.9, adult: Secondary | ICD-10-CM

## 2022-11-19 DIAGNOSIS — Z7401 Bed confinement status: Secondary | ICD-10-CM | POA: Diagnosis not present

## 2022-11-19 DIAGNOSIS — I252 Old myocardial infarction: Secondary | ICD-10-CM

## 2022-11-19 DIAGNOSIS — I2510 Atherosclerotic heart disease of native coronary artery without angina pectoris: Secondary | ICD-10-CM | POA: Diagnosis not present

## 2022-11-19 DIAGNOSIS — E559 Vitamin D deficiency, unspecified: Secondary | ICD-10-CM | POA: Diagnosis present

## 2022-11-19 DIAGNOSIS — F32A Depression, unspecified: Secondary | ICD-10-CM | POA: Diagnosis not present

## 2022-11-19 DIAGNOSIS — R9431 Abnormal electrocardiogram [ECG] [EKG]: Secondary | ICD-10-CM | POA: Diagnosis not present

## 2022-11-19 DIAGNOSIS — S72352A Displaced comminuted fracture of shaft of left femur, initial encounter for closed fracture: Secondary | ICD-10-CM | POA: Diagnosis not present

## 2022-11-19 DIAGNOSIS — K5900 Constipation, unspecified: Secondary | ICD-10-CM | POA: Diagnosis present

## 2022-11-19 DIAGNOSIS — J9811 Atelectasis: Secondary | ICD-10-CM | POA: Diagnosis not present

## 2022-11-19 DIAGNOSIS — S72491A Other fracture of lower end of right femur, initial encounter for closed fracture: Secondary | ICD-10-CM | POA: Diagnosis not present

## 2022-11-19 DIAGNOSIS — M25562 Pain in left knee: Secondary | ICD-10-CM | POA: Diagnosis not present

## 2022-11-19 DIAGNOSIS — Z7982 Long term (current) use of aspirin: Secondary | ICD-10-CM

## 2022-11-19 DIAGNOSIS — N400 Enlarged prostate without lower urinary tract symptoms: Secondary | ICD-10-CM | POA: Diagnosis not present

## 2022-11-19 DIAGNOSIS — I5030 Unspecified diastolic (congestive) heart failure: Secondary | ICD-10-CM | POA: Diagnosis not present

## 2022-11-19 DIAGNOSIS — W010XXA Fall on same level from slipping, tripping and stumbling without subsequent striking against object, initial encounter: Secondary | ICD-10-CM

## 2022-11-19 DIAGNOSIS — J90 Pleural effusion, not elsewhere classified: Secondary | ICD-10-CM | POA: Diagnosis not present

## 2022-11-19 DIAGNOSIS — M170 Bilateral primary osteoarthritis of knee: Secondary | ICD-10-CM | POA: Diagnosis present

## 2022-11-19 DIAGNOSIS — I509 Heart failure, unspecified: Secondary | ICD-10-CM | POA: Diagnosis present

## 2022-11-19 DIAGNOSIS — Z01818 Encounter for other preprocedural examination: Secondary | ICD-10-CM | POA: Diagnosis not present

## 2022-11-19 DIAGNOSIS — Z7985 Long-term (current) use of injectable non-insulin antidiabetic drugs: Secondary | ICD-10-CM

## 2022-11-19 DIAGNOSIS — Z96652 Presence of left artificial knee joint: Secondary | ICD-10-CM | POA: Diagnosis present

## 2022-11-19 DIAGNOSIS — E785 Hyperlipidemia, unspecified: Secondary | ICD-10-CM | POA: Diagnosis not present

## 2022-11-19 DIAGNOSIS — S72401A Unspecified fracture of lower end of right femur, initial encounter for closed fracture: Secondary | ICD-10-CM | POA: Diagnosis not present

## 2022-11-19 DIAGNOSIS — R29898 Other symptoms and signs involving the musculoskeletal system: Secondary | ICD-10-CM | POA: Diagnosis not present

## 2022-11-19 DIAGNOSIS — S72351A Displaced comminuted fracture of shaft of right femur, initial encounter for closed fracture: Secondary | ICD-10-CM | POA: Diagnosis not present

## 2022-11-19 DIAGNOSIS — I517 Cardiomegaly: Secondary | ICD-10-CM | POA: Diagnosis not present

## 2022-11-19 DIAGNOSIS — Z91041 Radiographic dye allergy status: Secondary | ICD-10-CM

## 2022-11-19 DIAGNOSIS — I358 Other nonrheumatic aortic valve disorders: Secondary | ICD-10-CM | POA: Diagnosis not present

## 2022-11-19 DIAGNOSIS — M9711XD Periprosthetic fracture around internal prosthetic right knee joint, subsequent encounter: Secondary | ICD-10-CM | POA: Diagnosis not present

## 2022-11-19 DIAGNOSIS — Z79899 Other long term (current) drug therapy: Secondary | ICD-10-CM

## 2022-11-19 HISTORY — DX: Type 2 diabetes mellitus without complications: E11.9

## 2022-11-19 LAB — BASIC METABOLIC PANEL
Anion gap: 13 (ref 5–15)
BUN: 19 mg/dL (ref 8–23)
CO2: 23 mmol/L (ref 22–32)
Calcium: 8.7 mg/dL — ABNORMAL LOW (ref 8.9–10.3)
Chloride: 104 mmol/L (ref 98–111)
Creatinine, Ser: 0.86 mg/dL (ref 0.61–1.24)
GFR, Estimated: >60 mL/min (ref >60)
Glucose, Bld: 113 mg/dL — ABNORMAL HIGH (ref 70–99)
Potassium: 3.6 mmol/L (ref 3.5–5.1)
Sodium: 140 mmol/L (ref 135–145)

## 2022-11-19 LAB — CBC WITH DIFFERENTIAL/PLATELET
Abs Immature Granulocytes: 0.04 10*3/uL (ref 0.00–0.07)
Basophils Absolute: 0 10*3/uL (ref 0.0–0.1)
Basophils Relative: 0 %
Eosinophils Absolute: 0 10*3/uL (ref 0.0–0.5)
Eosinophils Relative: 0 %
HCT: 44.7 % (ref 39.0–52.0)
Hemoglobin: 14.5 g/dL (ref 13.0–17.0)
Immature Granulocytes: 0 %
Lymphocytes Relative: 9 %
Lymphs Abs: 1.1 10*3/uL (ref 0.7–4.0)
MCH: 25.9 pg — ABNORMAL LOW (ref 26.0–34.0)
MCHC: 32.4 g/dL (ref 30.0–36.0)
MCV: 79.8 fL — ABNORMAL LOW (ref 80.0–100.0)
Monocytes Absolute: 1.3 10*3/uL — ABNORMAL HIGH (ref 0.1–1.0)
Monocytes Relative: 10 %
Neutro Abs: 9.6 10*3/uL — ABNORMAL HIGH (ref 1.7–7.7)
Neutrophils Relative %: 81 %
Platelets: 184 10*3/uL (ref 150–400)
RBC: 5.6 MIL/uL (ref 4.22–5.81)
RDW: 14.9 % (ref 11.5–15.5)
WBC: 12.1 10*3/uL — ABNORMAL HIGH (ref 4.0–10.5)
nRBC: 0 % (ref 0.0–0.2)

## 2022-11-19 LAB — GLUCOSE, CAPILLARY: Glucose-Capillary: 91 mg/dL (ref 70–99)

## 2022-11-19 LAB — HEMOGLOBIN A1C
Hgb A1c MFr Bld: 6.8 % — ABNORMAL HIGH (ref 4.8–5.6)
Mean Plasma Glucose: 148.46 mg/dL

## 2022-11-19 MED ORDER — ISOSORBIDE MONONITRATE ER 30 MG PO TB24
30.0000 mg | ORAL_TABLET | Freq: Every day | ORAL | Status: DC
  Administered 2022-11-19 – 2022-11-24 (×6): 30 mg via ORAL
  Filled 2022-11-19 (×6): qty 1

## 2022-11-19 MED ORDER — TRAZODONE HCL 50 MG PO TABS
50.0000 mg | ORAL_TABLET | Freq: Every day | ORAL | Status: DC
  Administered 2022-11-19 – 2022-11-23 (×5): 50 mg via ORAL
  Filled 2022-11-19 (×5): qty 1

## 2022-11-19 MED ORDER — OXYCODONE HCL 5 MG PO TABS
5.0000 mg | ORAL_TABLET | ORAL | Status: DC | PRN
  Administered 2022-11-19 – 2022-11-20 (×3): 10 mg via ORAL
  Administered 2022-11-21 (×2): 5 mg via ORAL
  Administered 2022-11-21: 10 mg via ORAL
  Administered 2022-11-22 – 2022-11-24 (×6): 5 mg via ORAL
  Administered 2022-11-24 (×2): 10 mg via ORAL
  Filled 2022-11-19 (×3): qty 1
  Filled 2022-11-19: qty 2
  Filled 2022-11-19 (×3): qty 1
  Filled 2022-11-19: qty 2
  Filled 2022-11-19: qty 1
  Filled 2022-11-19: qty 2
  Filled 2022-11-19: qty 1
  Filled 2022-11-19 (×3): qty 2
  Filled 2022-11-19: qty 1
  Filled 2022-11-19: qty 2

## 2022-11-19 MED ORDER — BISACODYL 5 MG PO TBEC: 5.0000 mg | DELAYED_RELEASE_TABLET | Freq: Every day | ORAL | Status: DC | PRN

## 2022-11-19 MED ORDER — IBUPROFEN 400 MG PO TABS
400.0000 mg | ORAL_TABLET | Freq: Once | ORAL | Status: AC
  Administered 2022-11-19: 400 mg via ORAL
  Filled 2022-11-19: qty 1

## 2022-11-19 MED ORDER — INSULIN ASPART 100 UNIT/ML IJ SOLN
0.0000 [IU] | Freq: Three times a day (TID) | INTRAMUSCULAR | Status: DC
  Administered 2022-11-20: 5 [IU] via SUBCUTANEOUS
  Administered 2022-11-20: 3 [IU] via SUBCUTANEOUS
  Administered 2022-11-21: 2 [IU] via SUBCUTANEOUS
  Administered 2022-11-21 (×2): 5 [IU] via SUBCUTANEOUS
  Administered 2022-11-22: 2 [IU] via SUBCUTANEOUS
  Administered 2022-11-22: 5 [IU] via SUBCUTANEOUS
  Administered 2022-11-22 – 2022-11-23 (×2): 8 [IU] via SUBCUTANEOUS
  Administered 2022-11-23: 5 [IU] via SUBCUTANEOUS
  Administered 2022-11-23: 2 [IU] via SUBCUTANEOUS
  Administered 2022-11-24: 5 [IU] via SUBCUTANEOUS
  Administered 2022-11-24: 3 [IU] via SUBCUTANEOUS

## 2022-11-19 MED ORDER — PANTOPRAZOLE SODIUM 40 MG PO TBEC
40.0000 mg | DELAYED_RELEASE_TABLET | Freq: Every day | ORAL | Status: DC
  Administered 2022-11-19 – 2022-11-24 (×6): 40 mg via ORAL
  Filled 2022-11-19 (×6): qty 1

## 2022-11-19 MED ORDER — METOPROLOL SUCCINATE ER 25 MG PO TB24
25.0000 mg | ORAL_TABLET | Freq: Every day | ORAL | Status: DC
  Administered 2022-11-19 – 2022-11-23 (×5): 25 mg via ORAL
  Filled 2022-11-19 (×6): qty 1

## 2022-11-19 MED ORDER — MIRTAZAPINE 15 MG PO TABS
30.0000 mg | ORAL_TABLET | Freq: Every day | ORAL | Status: DC
  Administered 2022-11-19 – 2022-11-23 (×5): 30 mg via ORAL
  Filled 2022-11-19 (×5): qty 2

## 2022-11-19 MED ORDER — FLUVOXAMINE MALEATE 100 MG PO TABS
100.0000 mg | ORAL_TABLET | Freq: Every day | ORAL | Status: DC
  Administered 2022-11-21 – 2022-11-24 (×4): 100 mg via ORAL
  Filled 2022-11-19 (×6): qty 1

## 2022-11-19 MED ORDER — ASPIRIN 81 MG PO CHEW
81.0000 mg | CHEWABLE_TABLET | Freq: Every day | ORAL | Status: DC
  Administered 2022-11-19 – 2022-11-24 (×6): 81 mg via ORAL
  Filled 2022-11-19 (×8): qty 1

## 2022-11-19 MED ORDER — ENOXAPARIN SODIUM 40 MG/0.4ML IJ SOSY
40.0000 mg | PREFILLED_SYRINGE | INTRAMUSCULAR | Status: DC
  Administered 2022-11-19: 40 mg via SUBCUTANEOUS
  Filled 2022-11-19: qty 0.4

## 2022-11-19 MED ORDER — ATORVASTATIN CALCIUM 10 MG PO TABS
10.0000 mg | ORAL_TABLET | Freq: Every day | ORAL | Status: DC
  Administered 2022-11-19 – 2022-11-23 (×5): 10 mg via ORAL
  Filled 2022-11-19 (×5): qty 1

## 2022-11-19 MED ORDER — ACETAMINOPHEN 500 MG PO TABS
1000.0000 mg | ORAL_TABLET | Freq: Once | ORAL | Status: AC
  Administered 2022-11-19: 1000 mg via ORAL
  Filled 2022-11-19: qty 2

## 2022-11-19 MED ORDER — HYDROMORPHONE HCL 1 MG/ML IJ SOLN
0.5000 mg | INTRAMUSCULAR | Status: DC | PRN
  Administered 2022-11-19 – 2022-11-22 (×8): 0.5 mg via INTRAVENOUS
  Filled 2022-11-19 (×8): qty 0.5

## 2022-11-19 MED ORDER — IBUPROFEN 400 MG PO TABS
600.0000 mg | ORAL_TABLET | Freq: Once | ORAL | Status: AC
  Administered 2022-11-19: 600 mg via ORAL
  Filled 2022-11-19: qty 2

## 2022-11-19 MED ORDER — INSULIN ASPART 100 UNIT/ML IJ SOLN
0.0000 [IU] | Freq: Every day | INTRAMUSCULAR | Status: DC
  Administered 2022-11-20: 3 [IU] via SUBCUTANEOUS
  Administered 2022-11-21: 2 [IU] via SUBCUTANEOUS

## 2022-11-19 MED ORDER — ACETAMINOPHEN 325 MG PO TABS
650.0000 mg | ORAL_TABLET | ORAL | Status: DC | PRN
  Administered 2022-11-21 – 2022-11-22 (×3): 650 mg via ORAL
  Filled 2022-11-19 (×3): qty 2

## 2022-11-19 MED ORDER — LORATADINE 10 MG PO TABS
10.0000 mg | ORAL_TABLET | Freq: Every day | ORAL | Status: DC
  Administered 2022-11-19 – 2022-11-24 (×6): 10 mg via ORAL
  Filled 2022-11-19 (×6): qty 1

## 2022-11-19 MED ORDER — SENNOSIDES-DOCUSATE SODIUM 8.6-50 MG PO TABS: 1.0000 | ORAL_TABLET | Freq: Every evening | ORAL | Status: DC | PRN

## 2022-11-19 MED ORDER — METOPROLOL SUCCINATE ER 50 MG PO TB24
50.0000 mg | ORAL_TABLET | Freq: Every day | ORAL | Status: DC
  Administered 2022-11-20 – 2022-11-24 (×5): 50 mg via ORAL
  Filled 2022-11-19 (×5): qty 1

## 2022-11-19 MED ORDER — HYDRALAZINE HCL 25 MG PO TABS: 25.0000 mg | ORAL_TABLET | Freq: Four times a day (QID) | ORAL | Status: DC | PRN

## 2022-11-19 MED ORDER — MORPHINE SULFATE (PF) 4 MG/ML IV SOLN
4.0000 mg | Freq: Once | INTRAVENOUS | Status: AC
  Administered 2022-11-19: 4 mg via INTRAVENOUS
  Filled 2022-11-19: qty 1

## 2022-11-19 MED ORDER — TAMSULOSIN HCL 0.4 MG PO CAPS
0.4000 mg | ORAL_CAPSULE | Freq: Every day | ORAL | Status: DC
  Administered 2022-11-19 – 2022-11-23 (×5): 0.4 mg via ORAL
  Filled 2022-11-19 (×5): qty 1

## 2022-11-19 NOTE — ED Triage Notes (Signed)
Pt bib [facility]. Pt stated he was trying to get into the house when his right leg gave out, and hit right knee. Stated his right knee feels numb. Pt was here this morning because he tripped and hurt left knee.

## 2022-11-19 NOTE — ED Notes (Signed)
[facility] has been called for transfer. After 7 they are sending a truck

## 2022-11-19 NOTE — ED Notes (Signed)
Noted minimal swelling to LEFT knee Ice pack given Medicated per MAR No numbness in extremity or abrasions Discharge paperwork reviewed Pt called wife for ride Pt able to stand and pivot to wheelchair, moved to lobby waiting on ride

## 2022-11-19 NOTE — Discharge Instructions (Addendum)
It was our pleasure to provide your ER care today - we hope that you feel better.  Fall precautions.  Take acetaminophen or ibuprofen as need.  Follow up with your doctor/orthopedist in one week if symptoms fail to improve/resolve.  Return to ER if worse, new symptoms, new/severe pain, weak/fainting, or other concern.

## 2022-11-19 NOTE — ED Provider Notes (Signed)
[place] EMERGENCY DEPARTMENT AT [facility] Provider Note   CSN: [id number] Arrival date & time: 11/19/22  1141     History  Chief Complaint  Patient presents with   Knee Injury    Michael Rose is a 72 y.o. male.  Pt states went to door to pick up a UPS package, and fell forward onto left knee. C/o pain to left knee. No faintness or dizziness prior to fall. No loc w fall. No head injury or headache. Denies neck or back pain. Other than left knee pain denies other pain or injury. No hip or ankle pain.  Skin intact. No anticoagulant use.   The history is provided by the patient, medical records and the EMS personnel.       Home Medications Prior to Admission medications   Medication Sig Start Date End Date Taking? Authorizing Provider  acetaminophen (TYLENOL) 325 MG tablet Take 650 mg by mouth as needed.    [provider]  aspirin 81 MG tablet Take 81 mg by mouth daily.    [provider]  atorvastatin (LIPITOR) 20 MG tablet Take 10 mg by mouth daily.    [provider]  dexlansoprazole (DEXILANT) 60 MG capsule Take 60 mg by mouth daily with breakfast.     [provider]  empagliflozin (JARDIANCE) 25 MG TABS tablet Take 25 mg by mouth daily.    [provider]  fluvoxaMINE (LUVOX) 100 MG tablet Take 100 mg by mouth daily with breakfast.     [provider]  glimepiride (AMARYL) 2 MG tablet Take 2 mg by mouth daily with breakfast.    [provider]  isosorbide mononitrate (IMDUR) 30 MG 24 hr tablet TAKE 1 TABLET BY MOUTH EVERY DAY 07/17/14   Jonelle Sidle, MD  loratadine (CLARITIN) 10 MG tablet Take 10 mg by mouth daily.    [provider]  metoprolol succinate (TOPROL-XL) 50 MG 24 hr tablet TAKE 1 TABLET BY MOUTH EVERY MORNING AND TAKE 1/2 TABLET EVERY EVENING - NEEDS APPOINTMENT 03/07/19   Jonelle Sidle, MD  mirtazapine (REMERON) 30 MG tablet Take 30 mg by mouth at bedtime.     [provider]  nitroGLYCERIN (NITROSTAT) 0.4 MG SL tablet Place 1 tablet (0.4 mg total) under the tongue every 5 (five) minutes as needed for chest pain. 04/30/14   Jonelle Sidle, MD  tamsulosin (FLOMAX) 0.4 MG CAPS capsule Take 1 capsule (0.4 mg total) by mouth daily after supper. 04/20/22   Jerilee Field, MD  traZODone (DESYREL) 50 MG tablet Take 50 mg by mouth at bedtime.    [provider]      Allergies    Iodinated contrast media and Penicillins    Review of Systems   Review of Systems  Constitutional:  Negative for fever.  Respiratory:  Negative for shortness of breath.   Cardiovascular:  Negative for chest pain.  Gastrointestinal:  Negative for abdominal pain.  Musculoskeletal:  Negative for back pain and neck stiffness.       Left knee pain  Skin:  Negative for wound.  Neurological:  Negative for weakness, numbness and headaches.    Physical Exam Updated Vital Signs BP 114/82 (BP Location: Right Arm)   Pulse 88   Temp 98.1 F (36.7 C) (Oral)   Resp 18   Ht 1.803 m (5\' 11" )   Wt 104.3 kg   SpO2 90%   BMI 32.08 kg/m  Physical Exam Vitals and nursing note  reviewed.  Constitutional:      Appearance: Normal appearance. He is well-developed.  HENT:     Head: Atraumatic.     Nose: Nose normal.     Mouth/Throat:     Mouth: Mucous membranes are moist.  Eyes:     General: No scleral icterus.    Conjunctiva/sclera: Conjunctivae normal.     Pupils: Pupils are equal, round, and reactive to light.  Neck:     Trachea: No tracheal deviation.  Cardiovascular:     Rate and Rhythm: Normal rate.     Pulses: Normal pulses.     Heart sounds: No murmur heard.    No gallop.  Pulmonary:     Effort: Pulmonary effort is normal. No accessory muscle usage or respiratory distress.  Chest:     Chest wall: No tenderness.  Abdominal:     General: Bowel sounds are normal. There is no distension.     Palpations: Abdomen is soft. There is no mass.      Tenderness: There is no abdominal tenderness.  Musculoskeletal:        General: No swelling.     Cervical back: Normal range of motion and neck supple. No rigidity or tenderness.     Comments: CTLS spine, non tender, aligned, no step off. Tenderness left knee. Left knee grossly stable. No large effusion. No pain w rom at hip or ankle. Distal pulses palp. Otherwise good rom bil extremities without pain or bony tenderness.   Skin:    General: Skin is warm and dry.     Findings: No rash.  Neurological:     Mental Status: He is alert.     Comments: Alert, speech clear. GCS 15. Motor/sens grossly intact bil.   Psychiatric:        Mood and Affect: Mood normal.     ED Results / Procedures / Treatments   Labs (all labs ordered are listed, but only abnormal results are displayed) Labs Reviewed - No data to display  EKG None  Radiology DG Knee Complete 4 Views Left  Result Date: 11/19/2022 CLINICAL DATA:  Fall.  Knee replacement 8 years ago. EXAM: LEFT KNEE - COMPLETE 4+ VIEW COMPARISON:  Radiographs dated March 16, 2008 FINDINGS: Status post left knee arthroplasty with intact hardware. No periprosthetic loosening or fracture. Prominent vascular calcification. Heterotopic ossification about the superior aspect of the patella. IMPRESSION: Status post left knee arthroplasty with intact hardware. No periprosthetic loosening or fracture. Electronically Signed   By: Larose Hires D.O.   On: 11/19/2022 12:12    Procedures Procedures    Medications Ordered in ED Medications  acetaminophen (TYLENOL) tablet 1,000 mg (has no administration in time range)  ibuprofen (ADVIL) tablet 400 mg (has no administration in time range)    ED Course/ Medical Decision Making/ A&P                             Medical Decision Making Problems Addressed: Contusion of left knee, initial encounter: acute illness or injury Fall from slip, trip, or stumble, initial encounter: acute illness or injury with  systemic symptoms that poses a threat to life or bodily functions  Amount and/or Complexity of Data Reviewed Independent Historian: EMS    Details: hx External Data Reviewed: notes. Radiology: ordered and independent interpretation performed. Decision-making details documented in ED Course.  Risk OTC drugs. Prescription drug management.   Imaging ordered.   Reviewed nursing notes and prior charts  for additional history.   Xrays reviewed/interpreted by me - no fx.   Ibuprofen po. Acetaminophen po.  Icepack.  Pt appears stable for d/c.   Rec f/u. Return precautions provided.           Final Clinical Impression(s) / ED Diagnoses Final diagnoses:  Fall from slip, trip, or stumble, initial encounter  Contusion of left knee, initial encounter    Rx / DC Orders ED Discharge Orders     None         Cathren Laine, MD 11/19/22 1222

## 2022-11-19 NOTE — H&P (Signed)
History and Physical    Michael Rose ZOX:[id number] DOB: 12-24-1950 DOA: 11/19/2022  PCP: [name], MD (Confirm with patient/family/NH records and if not entered, this has to be entered at [facility] point of entry) Patient coming from: Home  I have personally briefly reviewed patient's old medical records in [facility]  Chief Complaint: I fell and broke my knees  HPI: Michael Rose is a 72 y.o. male with medical history significant of CAD s/p stenting in 2008, bilateral knee OA status post bilateral TKR with chronic ambulation dysfunction, HTN, IIDM, HLD, anxiety/depression, GERD, presented with fall and bilateral knee pain.  Patient sustained 2 mechanical fall this morning.  First, he bent down to reach some tool on the floor, tipped over and hit his left knee.  He was able to stand up himself however after walking a few steps his left knee gave up and he fell again, but this time he fell on the right knee.  Excruciating pain on the right knee followed and he could not support himself to stand up again.  Family arrived and unable to hold him up due to his body weight.  He denies any loss of consciousness, denies any prodrome of lightheadedness shortness of breath chest pain before or after the fall.  At baseline he has chronic ambulation dysfunction and usually uses a cane to ambulate, he lives in the apartment with 3 steps walking each day and he reported that he can walk 5 to 10 minutes without significant shortness of breath and no chest pains reported.  No recent ear stress test. ED Course: Afebrile, none tachycardia none hypotension.  X-ray showed right knee distal femoral shaft emanated fracture, x-ray on left knee showed no fracture or dislocation.  WBC 12.  Creatinine 0.8.  Orthopedic surgery Dr. [name] contacted and recommend patient transferred to [facility] for ORIF in the morning.  Review of Systems: As per HPI otherwise 14 point review of systems negative.    Past  Medical History:  Diagnosis Date   Allergic reaction to contrast dye    Bipolar affective disorder (HCC)    Coronary atherosclerosis of native coronary artery    BMS to RCA 2008, [facility], Dr. 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 He has never used smokeless tobacco. He reports that he does not drink alcohol and does not use drugs.  Allergies  Allergen Reactions   Iodinated Contrast Media Hives and Itching   Penicillins Other (See Comments)    chillhood allergy     Family History  Problem Relation Age of Onset   Heart disease Mother    Heart disease Father     Prior to Admission medications   Medication Sig Start Date End Date Taking? Authorizing Provider  acetaminophen (TYLENOL) 325 MG tablet Take 650 mg by mouth as needed.    [provider]  aspirin 81 MG tablet Take 81 mg by mouth daily.    [provider]  atorvastatin (LIPITOR) 20 MG tablet Take 10 mg by mouth daily.    [provider]  dexlansoprazole (DEXILANT) 60 MG capsule Take 60 mg by mouth daily with breakfast.     [provider]  empagliflozin (JARDIANCE) 25 MG TABS tablet Take 25 mg by mouth daily.    [provider]  fluvoxaMINE (LUVOX) 100 MG tablet Take 100 mg by mouth daily with breakfast.     [provider]  glimepiride (AMARYL) 2 MG tablet Take 2 mg by mouth daily with  breakfast.    [provider]  isosorbide mononitrate (IMDUR) 30 MG 24 hr tablet TAKE 1 TABLET BY MOUTH EVERY DAY 07/17/14   Jonelle Sidle, MD  loratadine (CLARITIN) 10 MG tablet Take 10 mg by mouth daily.    [provider]  metoprolol succinate (TOPROL-XL) 50 MG 24 hr tablet TAKE 1 TABLET BY MOUTH EVERY MORNING AND TAKE 1/2 TABLET EVERY EVENING - NEEDS APPOINTMENT 03/07/19   Jonelle Sidle, MD  mirtazapine (REMERON) 30 MG tablet Take 30 mg by mouth at bedtime.    [provider]  nitroGLYCERIN (NITROSTAT) 0.4 MG SL tablet Place 1 tablet (0.4 mg total) under the tongue every 5 (five) minutes as needed for chest pain. 04/30/14   Jonelle Sidle, MD  tamsulosin (FLOMAX) 0.4 MG CAPS capsule Take 1 capsule (0.4 mg total) by mouth daily after supper. 04/20/22   Jerilee Field, MD  traZODone (DESYREL) 50 MG tablet Take 50 mg by mouth at bedtime.    [provider]    Physical Exam: Vitals:   11/19/22 1620 11/19/22 1645 11/19/22 1700 11/19/22 1730  BP:  (!) 145/86 (!) 141/92 (!) 161/94  Pulse:  83 83 80  Resp:      Temp:      TempSrc:      SpO2:  94% 91% 95%  Weight: 103.9 kg     Height: 5\' 11"  (1.803 m)       Constitutional: NAD, calm, comfortable Vitals:   11/19/22 1620 11/19/22 1645 11/19/22 1700 11/19/22 1730  BP:  (!) 145/86 (!) 141/92 (!) 161/94  Pulse:  83 83 80  Resp:      Temp:      TempSrc:      SpO2:  94% 91% 95%  Weight: 103.9 kg     Height: 5\' 11"  (1.803 m)      Eyes: PERRL, lids and conjunctivae normal ENMT: Mucous membranes are moist. Posterior pharynx clear of any exudate or lesions.Normal dentition.  Neck: normal, supple, no masses, no thyromegaly Respiratory: clear to auscultation bilaterally, no wheezing, no crackles. Normal respiratory effort. No accessory muscle use.  Cardiovascular: Regular rate and rhythm, no murmurs / rubs / gallops. No extremity edema. 2+ pedal pulses. No carotid bruits.  Abdomen: no  tenderness, no masses palpated. No hepatosplenomegaly. Bowel sounds positive.  Musculoskeletal: Right knee swollen pain and tenderness Skin: no rashes, lesions, ulcers. No induration Neurologic: CN 2-12 grossly intact. Sensation intact, DTR normal. Strength 5/5 in all 4.  Psychiatric: Normal judgment and insight. Alert and oriented x 3. Normal mood.     Labs on Admission: I have personally reviewed following labs and imaging studies  CBC: Recent Labs  Lab 11/19/22 1535  WBC 12.1*  NEUTROABS 9.6*  HGB  14.5  HCT 44.7  MCV 79.8*  PLT 184   Basic Metabolic Panel: Recent Labs  Lab 11/19/22 1535  NA 140  K 3.6  CL 104  CO2 23  GLUCOSE 113*  BUN 19  CREATININE 0.86  CALCIUM 8.7*   GFR: Estimated Creatinine Clearance: 95.2 mL/min (by C-G formula based on SCr of 0.86 mg/dL). Liver Function Tests: No results for input(s): "AST", "ALT", "ALKPHOS", "BILITOT", "PROT", "ALBUMIN" in the last 168 hours. No results for input(s): "LIPASE", "AMYLASE" in the last 168 hours. No results for input(s): "AMMONIA" in the last 168 hours. Coagulation Profile: No results for input(s): "INR", "PROTIME" in the last 168 hours. Cardiac Enzymes: No results for input(s): "CKTOTAL", "CKMB", "CKMBINDEX", "TROPONINI" in the last 168 hours. BNP (last 3 results) No results for input(s): "PROBNP" in the last 8760 hours. HbA1C: No results for input(s): "HGBA1C" in the last 72 hours. CBG: No results for input(s): "GLUCAP" in the last 168 hours. Lipid Profile: No results for input(s): "CHOL", "HDL", "LDLCALC", "TRIG", "CHOLHDL", "LDLDIRECT" in the last 72 hours. Thyroid Function Tests: No results for input(s): "TSH", "T4TOTAL", "FREET4", "T3FREE", "THYROIDAB" in the last 72 hours. Anemia Panel: No results for input(s): "VITAMINB12", "FOLATE", "FERRITIN", "TIBC", "IRON", "RETICCTPCT" in the last 72 hours. Urine analysis:    Component Value Date/Time   COLORURINE YELLOW 10/13/2012 1209   APPEARANCEUR  Clear 04/20/2022 1459   LABSPEC 1.017 10/13/2012 1209   PHURINE 6.5 10/13/2012 1209   GLUCOSEU 3+ (A) 04/20/2022 1459   HGBUR NEGATIVE 10/13/2012 1209   BILIRUBINUR Negative 04/20/2022 1459   KETONESUR NEGATIVE 10/13/2012 1209   PROTEINUR Negative 04/20/2022 1459   PROTEINUR NEGATIVE 10/13/2012 1209   UROBILINOGEN 1.0 10/13/2012 1209   NITRITE Negative 04/20/2022 1459   NITRITE NEGATIVE 10/13/2012 1209   LEUKOCYTESUR Negative 04/20/2022 1459    Radiological Exams on Admission: DG Knee 2 Views Right  Result Date: 11/19/2022 CLINICAL DATA:  Trauma, fall, pain EXAM: RIGHT KNEE - 1-2 VIEW COMPARISON:  None Available. FINDINGS: There is previous right knee arthroplasty. There is severely comminuted fracture in the distal shaft of femur. There is 10 mm offset in alignment of fracture fragments along the lateral aspect. There is overriding of fracture fragments. Arterial calcifications are seen in soft tissues. IMPRESSION: Previous right knee arthroplasty. Recent comminuted displaced fracture is seen in the distal shaft of right femur. Arteriosclerosis. Electronically Signed   By: Ernie Avena M.D.   On: 11/19/2022 16:47   DG Knee Complete 4 Views Left  Result Date: 11/19/2022 CLINICAL DATA:  Fall.  Knee replacement 8 years ago. EXAM: LEFT KNEE - COMPLETE 4+ VIEW COMPARISON:  Radiographs dated March 16, 2008 FINDINGS: Status post left knee arthroplasty with intact hardware. No periprosthetic loosening or fracture. Prominent vascular calcification. Heterotopic ossification about the superior aspect of the patella. IMPRESSION: Status post left knee arthroplasty with intact hardware. No periprosthetic loosening or fracture. Electronically Signed   By: Larose Hires D.O.   On: 11/19/2022 12:12    EKG: Ordered  Assessment/Plan Principal Problem:   Femoral fracture (HCC) Active Problems:   Mixed hyperlipidemia   MORBID OBESITY   Essential hypertension, benign   CORONARY ATHEROSCLEROSIS  NATIVE CORONARY ARTERY   Closed fracture of right distal femur (HCC)  (please populate well all problems here in Problem List. (For example, if patient is on BP meds at home and you resume or decide to hold them, it is a problem that needs to be her. Same for CAD, COPD, HLD and so  on)  Right knee comminuted distal femur fracture -Secondary to mechanical fall -ORIF tomorrow -Preop evaluation, at baseline patient is able to tolerate 4 METS activity, no indication for preop ischemia workup.  Echocardiogram ordered as last echo was 10 years ago.  Calculated 30-day perioperation major cardiac risk 6%, medically cleared for ORIF with general anesthesia with acceptable risk.   -Continue aspirin and beta-blocker and statin  HTN, uncontrolled -Secondary to acute fracture -Continue metoprolol and Imdur -Add as needed hydralazine  Leukocytosis -Secondary to fracture, no symptoms signs of acute infection  IIDM -Hold off Amaryl and Farxiga -Start sliding scale  Hx of CAD -No angina symptoms -Did not any chest pain or shortness of breath -EKG pending, echocardiogram ordered -Continue aspirin, metoprolol, statin, and Imdur.  Anxiety/depression -Trazodone and Remeron  BPH -Stable, continue Flomax    DVT prophylaxis: Lovenox Code Status: Full code Family Communication: Wife at bedside Disposition Plan: Patient sick with right knee fracture requiring ORIF, expect more than 2 midnight hospital stay Consults called: Orthopedic surgery Admission status: MedSurg admission   Emeline General MD [facility] Pager [phone]  11/19/2022, 6:13 PM

## 2022-11-19 NOTE — ED Notes (Signed)
Ice pack applied to right knee

## 2022-11-19 NOTE — ED Provider Notes (Signed)
[place] EMERGENCY DEPARTMENT AT [facility] Provider Note   CSN: [id number] Arrival date & time: 11/19/22  1611     History  Chief Complaint  Patient presents with   Densel Mehrotra is a 72 y.o. male.  History of heart disease, bipolar disorder, CAD, hypertension, hyperlipidemia.  Presents the ER today for right knee pain.  He states he was walking with his cane and his right knee gave out he fell directly onto the knee, having swelling and pain to the distal femur area.  Denies numbness tingling or weakness to the extremity.  He is not on blood thinners.  Denies head injury or loss of consciousness, he did not get dizzy.  Family member at bedside states he has been having trouble with his gait and they were going to be starting physical therapy in 5 days for this.  He was here earlier today after tripping and falling on his other knee.   Fall       Home Medications Prior to Admission medications   Medication Sig Start Date End Date Taking? Authorizing Provider  acetaminophen (TYLENOL) 325 MG tablet Take 650 mg by mouth as needed.    [provider]  aspirin 81 MG tablet Take 81 mg by mouth daily.    [provider]  atorvastatin (LIPITOR) 20 MG tablet Take 10 mg by mouth daily.    [provider]  dexlansoprazole (DEXILANT) 60 MG capsule Take 60 mg by mouth daily with breakfast.     [provider]  empagliflozin (JARDIANCE) 25 MG TABS tablet Take 25 mg by mouth daily.    [provider]  fluvoxaMINE (LUVOX) 100 MG tablet Take 100 mg by mouth daily with breakfast.     [provider]  glimepiride (AMARYL) 2 MG tablet Take 2 mg by mouth daily with breakfast.    [provider]  isosorbide mononitrate (IMDUR) 30 MG 24 hr tablet TAKE 1 TABLET BY MOUTH EVERY DAY 07/17/14   Jonelle Sidle, MD  loratadine (CLARITIN) 10 MG tablet Take 10 mg by mouth daily.    [provider]  metoprolol  succinate (TOPROL-XL) 50 MG 24 hr tablet TAKE 1 TABLET BY MOUTH EVERY MORNING AND TAKE 1/2 TABLET EVERY EVENING - NEEDS APPOINTMENT 03/07/19   Jonelle Sidle, MD  mirtazapine (REMERON) 30 MG tablet Take 30 mg by mouth at bedtime.    [provider]  nitroGLYCERIN (NITROSTAT) 0.4 MG SL tablet Place 1 tablet (0.4 mg total) under the tongue every 5 (five) minutes as needed for chest pain. 04/30/14   Jonelle Sidle, MD  tamsulosin (FLOMAX) 0.4 MG CAPS capsule Take 1 capsule (0.4 mg total) by mouth daily after supper. 04/20/22   Jerilee Field, MD  traZODone (DESYREL) 50 MG tablet Take 50 mg by mouth at bedtime.    [provider]      Allergies    Iodinated contrast media and Penicillins    Review of Systems   Review of Systems  Physical Exam Updated Vital Signs BP (!) 165/93   Pulse 92   Temp 98.4 F (36.9 C) (Oral)   Resp 18   Ht 5\' 11"  (1.803 m)   Wt 103.9 kg   SpO2 93%   BMI 31.94 kg/m  Physical Exam Vitals and nursing note reviewed.  Constitutional:      General: He is not in acute distress.    Appearance: He is well-developed.  HENT:  Head: Normocephalic and atraumatic.     Mouth/Throat:     Mouth: Mucous membranes are dry.  Eyes:     Conjunctiva/sclera: Conjunctivae normal.  Cardiovascular:     Rate and Rhythm: Normal rate and regular rhythm.     Heart sounds: No murmur heard. Pulmonary:     Effort: Pulmonary effort is normal. No respiratory distress.     Breath sounds: Normal breath sounds.  Abdominal:     Palpations: Abdomen is soft.     Tenderness: There is no abdominal tenderness.  Musculoskeletal:        General: No swelling.     Cervical back: Neck supple.     Comments: Tenderness and swelling proximal to the right knee.  Patient keeps knee slightly flexed.  DP and PT pulses intact of the foot, sensation intact throughout the entire right lower extremity.  Patient can flex and extend the right ankle without difficulty, no  tenderness to the right hip.   Mild ecchymosis to the right olecranon with normal range of motion to left elbow with no significant tenderness or crepitus, no swelling.  Skin:    General: Skin is warm and dry.     Capillary Refill: Capillary refill takes less than 2 seconds.  Neurological:     Mental Status: He is alert.  Psychiatric:        Mood and Affect: Mood normal.     ED Results / Procedures / Treatments   Labs (all labs ordered are listed, but only abnormal results are displayed) Labs Reviewed  CBC WITH DIFFERENTIAL/PLATELET - Abnormal; Notable for the following components:      Result Value   WBC 12.1 (*)    MCV 79.8 (*)    MCH 25.9 (*)    Neutro Abs 9.6 (*)    Monocytes Absolute 1.3 (*)    All other components within normal limits  BASIC METABOLIC PANEL - Abnormal; Notable for the following components:   Glucose, Bld 113 (*)    Calcium 8.7 (*)    All other components within normal limits    EKG None  Radiology DG Knee 2 Views Right  Result Date: 11/19/2022 CLINICAL DATA:  Trauma, fall, pain EXAM: RIGHT KNEE - 1-2 VIEW COMPARISON:  None Available. FINDINGS: There is previous right knee arthroplasty. There is severely comminuted fracture in the distal shaft of femur. There is 10 mm offset in alignment of fracture fragments along the lateral aspect. There is overriding of fracture fragments. Arterial calcifications are seen in soft tissues. IMPRESSION: Previous right knee arthroplasty. Recent comminuted displaced fracture is seen in the distal shaft of right femur. Arteriosclerosis. Electronically Signed   By: Ernie Avena M.D.   On: 11/19/2022 16:47   DG Knee Complete 4 Views Left  Result Date: 11/19/2022 CLINICAL DATA:  Fall.  Knee replacement 8 years ago. EXAM: LEFT KNEE - COMPLETE 4+ VIEW COMPARISON:  Radiographs dated March 16, 2008 FINDINGS: Status post left knee arthroplasty with intact hardware. No periprosthetic loosening or fracture. Prominent  vascular calcification. Heterotopic ossification about the superior aspect of the patella. IMPRESSION: Status post left knee arthroplasty with intact hardware. No periprosthetic loosening or fracture. Electronically Signed   By: Larose Hires D.O.   On: 11/19/2022 12:12    Procedures Procedures    Medications Ordered in ED Medications  ibuprofen (ADVIL) tablet 600 mg (600 mg Oral Given 11/19/22 1737)  morphine (PF) 4 MG/ML injection 4 mg (4 mg Intravenous Given 11/19/22 1738)    ED Course/  Medical Decision Making/ A&P                             Medical Decision Making This patient presents to the ED for concern of R knee pain, this involves an extensive number of treatment options, and is a complaint that carries with it a high risk of complications and morbidity.  The differential diagnosis includes, dislocation, other   Co morbidities that complicate the patient evaluation :   Knee arthroplasty   Additional history obtained:  Additional history obtained from EMR External records from outside source obtained and reviewed including notes   Lab Tests:  I Ordered, and personally interpreted labs.  The pertinent results include:  mild leukocytosis likely due to acute fracture    Imaging Studies ordered:  I ordered imaging studies including Xray left knee  I independently visualized and interpreted imaging which showed comminuted fracture distal shaft left femur I agree with the radiologist interpretation      Consultations Obtained:  I requested consultation with the orthopedic specialist Dr. Ennis Forts,  and discussed lab and imaging findings as well as pertinent plan - they recommend: Admission to [name] to hospitalist service, keep pt. NPO after midnight    Problem List / ED Course / Critical interventions / Medication management  R distal femur fx after mechanical fall without any other injuries.  Discussed with orthopedics as above who wanted him admitted to  [facility] for surgery likely to Toradol.  Patient is on blood thinners, no other complaints, basic labs reassuring.  Patient given morphine for pain with improvement of his symptoms. I ordered medication including morphine  for pain  Reevaluation of the patient after these medicines showed that the patient improved I have reviewed the patients home medicines and have made adjustments as needed      Amount and/or Complexity of Data Reviewed Labs: ordered. Radiology: ordered.  Risk Prescription drug management. Decision regarding hospitalization.           Final Clinical Impression(s) / ED Diagnoses Final diagnoses:  Closed fracture of distal end of right femur, unspecified fracture morphology, initial encounter [facility])    Rx / DC Orders ED Discharge Orders     None         Josem Kaufmann 11/19/22 1805    Cathren Laine, MD 11/25/22 1401

## 2022-11-19 NOTE — Progress Notes (Signed)
Plan for ORIF periprosthetic distal femur fracture Friday pending medical optimization and OR availability. Full consult note to follow.

## 2022-11-19 NOTE — ED Triage Notes (Signed)
Pt tripped at home and landed on left knee today with hx of replacement 8 years ago.

## 2022-11-19 NOTE — ED Notes (Signed)
ED TO INPATIENT HANDOFF REPORT  ED Nurse Name and Phone #: [phone] Levander Campion Name/Age/Gender Michael Rose 72 y.o. male Room/Bed: APA04/APA04  Code Status   Code Status: Full Code  Home/SNF/Other Home Patient oriented to: self, place, time, and situation Is this baseline? Yes   Triage Complete: Triage complete  Chief Complaint Femoral fracture (HCC) [S72.90XA]  Triage Note Pt bib [facility]. Pt stated he was trying to get into the house when his right leg gave out, and hit right knee. Stated his right knee feels numb. Pt was here this morning because he tripped and hurt left knee.    Allergies Allergies  Allergen Reactions   Iodinated Contrast Media Hives and Itching   Penicillins Other (See Comments)    chillhood allergy     Level of Care/Admitting Diagnosis ED Disposition     ED Disposition  Admit   Condition  --   Comment  Hospital Area: [facility] [[id number]]  Level of Care: Med-Surg [16]  May admit patient to [name] or [facility] if equivalent level of care is available:: No  Covid Evaluation: Asymptomatic - no recent exposure (last 10 days) testing not required  Diagnosis: Femoral fracture [facility]) [[id number]]  Admitting Physician: Emeline General [[id number]]  Attending Physician: Emeline General [[id number]]  Certification:: I certify this patient will need inpatient services for at least 2 midnights  Estimated Length of Stay: 2          B Medical/Surgery History Past Medical History:  Diagnosis Date   Allergic reaction to contrast dye    Bipolar affective disorder (HCC)    Coronary atherosclerosis of native coronary artery    BMS to RCA 2008, [facility], Dr. 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    Past Surgical History:  Procedure Laterality Date   CHOLECYSTECTOMY   2013   JOINT REPLACEMENT     TOTAL KNEE ARTHROPLASTY     Left   TOTAL KNEE ARTHROPLASTY Right 10/21/2012   Dr Madelon Lips   TOTAL KNEE ARTHROPLASTY Right 10/21/2012   Procedure: TOTAL KNEE ARTHROPLASTY;  Surgeon: Thera Flake., MD;  Location: [facility] OR;  Service: Orthopedics;  Laterality: Right;   VASECTOMY       A IV Location/Drains/Wounds Patient Lines/Drains/Airways Status     Active Line/Drains/Airways     Name Placement date Placement time Site Days   Peripheral IV 11/19/22 20 G 1" Anterior;Right Forearm 11/19/22  1738  Forearm  less than 1            Intake/Output Last 24 hours No intake or output data in the 24 hours ending 11/19/22 1946  Labs/Imaging Results for orders placed or performed during the hospital encounter of 11/19/22 (from the past 48 hour(s))  CBC with Differential     Status: Abnormal   Collection Time: 11/19/22  3:35 PM  Result Value Ref Range   WBC 12.1 (H) 4.0 - 10.5 K/uL   RBC 5.60 4.22 - 5.81 MIL/uL   Hemoglobin 14.5 13.0 - 17.0 g/dL   HCT 57.8 46.9 - 62.9 %   MCV 79.8 (L) 80.0 - 100.0 fL   MCH 25.9 (L) 26.0 - 34.0 pg   MCHC 32.4 30.0 - 36.0 g/dL   RDW 52.8 41.3 -  15.5 %   Platelets 184 150 - 400 K/uL   nRBC 0.0 0.0 - 0.2 %   Neutrophils Relative % 81 %   Neutro Abs 9.6 (H) 1.7 - 7.7 K/uL   Lymphocytes Relative 9 %   Lymphs Abs 1.1 0.7 - 4.0 K/uL   Monocytes Relative 10 %   Monocytes Absolute 1.3 (H) 0.1 - 1.0 K/uL   Eosinophils Relative 0 %   Eosinophils Absolute 0.0 0.0 - 0.5 K/uL   Basophils Relative 0 %   Basophils Absolute 0.0 0.0 - 0.1 K/uL   Immature Granulocytes 0 %   Abs Immature Granulocytes 0.04 0.00 - 0.07 K/uL    Comment: Performed at [facility], [address], [place], [place] [address]  Basic metabolic panel     Status: Abnormal   Collection Time: 11/19/22  3:35 PM  Result Value Ref Range   Sodium 140 135 - 145 mmol/L   Potassium 3.6 3.5 - 5.1 mmol/L   Chloride 104 98 - 111 mmol/L   CO2 23 22 - 32 mmol/L   Glucose,  Bld 113 (H) 70 - 99 mg/dL    Comment: Glucose reference range applies only to samples taken after fasting for at least 8 hours.   BUN 19 8 - 23 mg/dL   Creatinine, Ser 6.04 0.61 - 1.24 mg/dL   Calcium 8.7 (L) 8.9 - 10.3 mg/dL   GFR, Estimated >54 >09 mL/min    Comment: (NOTE) Calculated using the CKD-EPI Creatinine Equation (2021)    Anion gap 13 5 - 15    Comment: Performed at [facility], [address], [place], [place] [address]   DG Knee 2 Views Right  Result Date: 11/19/2022 CLINICAL DATA:  Trauma, fall, pain EXAM: RIGHT KNEE - 1-2 VIEW COMPARISON:  None Available. FINDINGS: There is previous right knee arthroplasty. There is severely comminuted fracture in the distal shaft of femur. There is 10 mm offset in alignment of fracture fragments along the lateral aspect. There is overriding of fracture fragments. Arterial calcifications are seen in soft tissues. IMPRESSION: Previous right knee arthroplasty. Recent comminuted displaced fracture is seen in the distal shaft of right femur. Arteriosclerosis. Electronically Signed   By: Ernie Avena M.D.   On: 11/19/2022 16:47   DG Knee Complete 4 Views Left  Result Date: 11/19/2022 CLINICAL DATA:  Fall.  Knee replacement 8 years ago. EXAM: LEFT KNEE - COMPLETE 4+ VIEW COMPARISON:  Radiographs dated March 16, 2008 FINDINGS: Status post left knee arthroplasty with intact hardware. No periprosthetic loosening or fracture. Prominent vascular calcification. Heterotopic ossification about the superior aspect of the patella. IMPRESSION: Status post left knee arthroplasty with intact hardware. No periprosthetic loosening or fracture. Electronically Signed   By: Larose Hires D.O.   On: 11/19/2022 12:12    Pending Labs Unresulted Labs (From admission, onward)     Start     Ordered   11/20/22 0500  CBC  Tomorrow morning,   R        11/19/22 1812   11/19/22 1815  Hemoglobin A1c  Once,   R       Comments: To assess prior glycemic control     11/19/22 1814            Vitals/Pain Today's Vitals   11/19/22 1730 11/19/22 1915 11/19/22 1930 11/19/22 1931  BP: (!) 161/94 (!) 155/87 (!) 152/105   Pulse: 80 79 80   Resp:  16    Temp:      TempSrc:  SpO2: 95% 95% 96%   Weight:      Height:      PainSc:    10-Worst pain ever    Isolation Precautions No active isolations  Medications Medications  acetaminophen (TYLENOL) tablet 650 mg (has no administration in time range)  aspirin chewable tablet 81 mg (81 mg Oral Given 11/19/22 1851)  atorvastatin (LIPITOR) tablet 10 mg (has no administration in time range)  isosorbide mononitrate (IMDUR) 24 hr tablet 30 mg (30 mg Oral Given 11/19/22 1850)  metoprolol succinate (TOPROL-XL) 24 hr tablet 50 mg (has no administration in time range)  fluvoxaMINE (LUVOX) tablet 100 mg (has no administration in time range)  mirtazapine (REMERON) tablet 30 mg (has no administration in time range)  traZODone (DESYREL) tablet 50 mg (has no administration in time range)  pantoprazole (PROTONIX) EC tablet 40 mg (40 mg Oral Given 11/19/22 1850)  tamsulosin (FLOMAX) capsule 0.4 mg (0.4 mg Oral Given 11/19/22 1851)  loratadine (CLARITIN) tablet 10 mg (10 mg Oral Given 11/19/22 1850)  enoxaparin (LOVENOX) injection 40 mg (40 mg Subcutaneous Given 11/19/22 1851)  HYDROmorphone (DILAUDID) injection 0.5 mg (0.5 mg Intravenous Given 11/19/22 1936)  oxyCODONE (Oxy IR/ROXICODONE) immediate release tablet 5-10 mg (has no administration in time range)  senna-docusate (Senokot-S) tablet 1 tablet (has no administration in time range)  bisacodyl (DULCOLAX) EC tablet 5 mg (has no administration in time range)  insulin aspart (novoLOG) injection 0-15 Units (has no administration in time range)  insulin aspart (novoLOG) injection 0-5 Units (has no administration in time range)  hydrALAZINE (APRESOLINE) tablet 25 mg (has no administration in time range)  metoprolol succinate (TOPROL-XL) 24 hr tablet 25 mg (has no  administration in time range)  ibuprofen (ADVIL) tablet 600 mg (600 mg Oral Given 11/19/22 1737)  morphine (PF) 4 MG/ML injection 4 mg (4 mg Intravenous Given 11/19/22 1738)    Mobility non-ambulatory - limited by femur fx, otherwise ambulatory     Focused Assessments RLE: pedal pulse 2+, sensation intact,  cap refill<2sec   R Recommendations: See Admitting Provider Note  Report given to:   Additional Notes: Fall at home resulting in R femur fx, hard of hearing

## 2022-11-20 ENCOUNTER — Encounter (HOSPITAL_COMMUNITY)
Admission: EM | Disposition: A | Discharge status: Skilled Nursing Facility | Payer: Self-pay | Source: Home / Self Care | Attending: Internal Medicine

## 2022-11-20 ENCOUNTER — Inpatient Hospital Stay (HOSPITAL_COMMUNITY): Payer: Medicare PPO | Admitting: Certified Registered"

## 2022-11-20 ENCOUNTER — Inpatient Hospital Stay (HOSPITAL_COMMUNITY): Payer: Medicare PPO

## 2022-11-20 ENCOUNTER — Encounter (HOSPITAL_COMMUNITY): Payer: Self-pay | Admitting: Internal Medicine

## 2022-11-20 ENCOUNTER — Other Ambulatory Visit: Payer: Self-pay

## 2022-11-20 DIAGNOSIS — E782 Mixed hyperlipidemia: Secondary | ICD-10-CM | POA: Diagnosis not present

## 2022-11-20 DIAGNOSIS — Z01818 Encounter for other preprocedural examination: Secondary | ICD-10-CM | POA: Diagnosis not present

## 2022-11-20 DIAGNOSIS — Z955 Presence of coronary angioplasty implant and graft: Secondary | ICD-10-CM

## 2022-11-20 DIAGNOSIS — E119 Type 2 diabetes mellitus without complications: Secondary | ICD-10-CM

## 2022-11-20 DIAGNOSIS — D72829 Elevated white blood cell count, unspecified: Secondary | ICD-10-CM

## 2022-11-20 DIAGNOSIS — F32A Depression, unspecified: Secondary | ICD-10-CM

## 2022-11-20 DIAGNOSIS — I2510 Atherosclerotic heart disease of native coronary artery without angina pectoris: Secondary | ICD-10-CM | POA: Diagnosis not present

## 2022-11-20 DIAGNOSIS — I10 Essential (primary) hypertension: Secondary | ICD-10-CM

## 2022-11-20 DIAGNOSIS — S72491A Other fracture of lower end of right femur, initial encounter for closed fracture: Secondary | ICD-10-CM | POA: Diagnosis not present

## 2022-11-20 DIAGNOSIS — M9711XA Periprosthetic fracture around internal prosthetic right knee joint, initial encounter: Secondary | ICD-10-CM

## 2022-11-20 DIAGNOSIS — N400 Enlarged prostate without lower urinary tract symptoms: Secondary | ICD-10-CM

## 2022-11-20 DIAGNOSIS — I252 Old myocardial infarction: Secondary | ICD-10-CM | POA: Diagnosis not present

## 2022-11-20 DIAGNOSIS — I517 Cardiomegaly: Secondary | ICD-10-CM

## 2022-11-20 DIAGNOSIS — I5030 Unspecified diastolic (congestive) heart failure: Secondary | ICD-10-CM

## 2022-11-20 DIAGNOSIS — F419 Anxiety disorder, unspecified: Secondary | ICD-10-CM

## 2022-11-20 DIAGNOSIS — I358 Other nonrheumatic aortic valve disorders: Secondary | ICD-10-CM

## 2022-11-20 HISTORY — PX: ORIF FEMUR FRACTURE: SHX2119

## 2022-11-20 LAB — GLUCOSE, CAPILLARY
Glucose-Capillary: 170 mg/dL — ABNORMAL HIGH (ref 70–99)
Glucose-Capillary: 121 mg/dL — ABNORMAL HIGH (ref 70–99)
Glucose-Capillary: 123 mg/dL — ABNORMAL HIGH (ref 70–99)
Glucose-Capillary: 127 mg/dL — ABNORMAL HIGH (ref 70–99)
Glucose-Capillary: 222 mg/dL — ABNORMAL HIGH (ref 70–99)
Glucose-Capillary: 293 mg/dL — ABNORMAL HIGH (ref 70–99)

## 2022-11-20 LAB — CBC
HCT: 39.3 % (ref 39.0–52.0)
Hemoglobin: 12.6 g/dL — ABNORMAL LOW (ref 13.0–17.0)
MCH: 25.7 pg — ABNORMAL LOW (ref 26.0–34.0)
MCHC: 32.1 g/dL (ref 30.0–36.0)
MCV: 80 fL (ref 80.0–100.0)
Platelets: 175 10*3/uL (ref 150–400)
RBC: 4.91 MIL/uL (ref 4.22–5.81)
RDW: 15 % (ref 11.5–15.5)
WBC: 7 10*3/uL (ref 4.0–10.5)
nRBC: 0 % (ref 0.0–0.2)

## 2022-11-20 LAB — ECHOCARDIOGRAM COMPLETE
AR max vel: 2.05 cm2
AV Peak grad: 10.1 mmHg
Ao pk vel: 1.59 m/s
Area-P 1/2: 4.06 cm2
Height: 71 in
S' Lateral: 3.4 cm
Weight: 3664 oz

## 2022-11-20 LAB — VITAMIN D 25 HYDROXY (VIT D DEFICIENCY, FRACTURES): Vit D, 25-Hydroxy: 4.94 ng/mL — ABNORMAL LOW (ref 30–100)

## 2022-11-20 SURGERY — OPEN REDUCTION INTERNAL FIXATION (ORIF) DISTAL FEMUR FRACTURE
Anesthesia: General | Site: Leg Upper | Laterality: Right

## 2022-11-20 MED ORDER — POLYETHYLENE GLYCOL 3350 17 G PO PACK
17.0000 g | PACK | Freq: Every day | ORAL | Status: DC | PRN
  Administered 2022-11-22: 17 g via ORAL
  Filled 2022-11-20: qty 1

## 2022-11-20 MED ORDER — METOCLOPRAMIDE HCL 5 MG/ML IJ SOLN: 5.0000 mg | Freq: Three times a day (TID) | INTRAMUSCULAR | Status: DC | PRN

## 2022-11-20 MED ORDER — CHLORHEXIDINE GLUCONATE 0.12 % MT SOLN
OROMUCOSAL | Status: AC
  Administered 2022-11-20: 15 mL via OROMUCOSAL
  Filled 2022-11-20: qty 15

## 2022-11-20 MED ORDER — METOCLOPRAMIDE HCL 5 MG PO TABS: 5.0000 mg | ORAL_TABLET | Freq: Three times a day (TID) | ORAL | Status: DC | PRN

## 2022-11-20 MED ORDER — TRANEXAMIC ACID-NACL 1000-0.7 MG/100ML-% IV SOLN
INTRAVENOUS | Status: AC
  Filled 2022-11-20: qty 100

## 2022-11-20 MED ORDER — DEXAMETHASONE SODIUM PHOSPHATE 10 MG/ML IJ SOLN
INTRAMUSCULAR | Status: DC | PRN
  Administered 2022-11-20: 10 mg via INTRAVENOUS

## 2022-11-20 MED ORDER — ONDANSETRON HCL 4 MG/2ML IJ SOLN
INTRAMUSCULAR | Status: AC
  Filled 2022-11-20: qty 2

## 2022-11-20 MED ORDER — CEFAZOLIN SODIUM-DEXTROSE 2-4 GM/100ML-% IV SOLN
2.0000 g | INTRAVENOUS | Status: AC
  Administered 2022-11-20: 2 g via INTRAVENOUS

## 2022-11-20 MED ORDER — METHOCARBAMOL 1000 MG/10ML IJ SOLN: 500.0000 mg | Freq: Four times a day (QID) | INTRAVENOUS | Status: DC | PRN

## 2022-11-20 MED ORDER — SUGAMMADEX SODIUM 200 MG/2ML IV SOLN
INTRAVENOUS | Status: DC | PRN
  Administered 2022-11-20: 200 mg via INTRAVENOUS

## 2022-11-20 MED ORDER — CHLORHEXIDINE GLUCONATE 0.12 % MT SOLN: 15.0000 mL | Freq: Once | OROMUCOSAL | Status: AC

## 2022-11-20 MED ORDER — SODIUM CHLORIDE 0.9 % IV SOLN: INTRAVENOUS | Status: AC

## 2022-11-20 MED ORDER — ONDANSETRON HCL 4 MG/2ML IJ SOLN
INTRAMUSCULAR | Status: DC | PRN
  Administered 2022-11-20: 4 mg via INTRAVENOUS

## 2022-11-20 MED ORDER — METHOCARBAMOL 500 MG PO TABS
500.0000 mg | ORAL_TABLET | Freq: Four times a day (QID) | ORAL | Status: DC | PRN
  Administered 2022-11-21 – 2022-11-24 (×5): 500 mg via ORAL
  Filled 2022-11-20 (×5): qty 1

## 2022-11-20 MED ORDER — CEFAZOLIN SODIUM-DEXTROSE 2-4 GM/100ML-% IV SOLN
INTRAVENOUS | Status: AC
  Administered 2022-11-20: 2 g via INTRAVENOUS
  Filled 2022-11-20: qty 100

## 2022-11-20 MED ORDER — FENTANYL CITRATE (PF) 250 MCG/5ML IJ SOLN
INTRAMUSCULAR | Status: DC | PRN
  Administered 2022-11-20 (×2): 50 ug via INTRAVENOUS

## 2022-11-20 MED ORDER — CHLORHEXIDINE GLUCONATE 4 % EX SOLN: 60.0000 mL | Freq: Once | CUTANEOUS | Status: DC

## 2022-11-20 MED ORDER — SENNOSIDES-DOCUSATE SODIUM 8.6-50 MG PO TABS
1.0000 | ORAL_TABLET | Freq: Two times a day (BID) | ORAL | Status: DC
  Administered 2022-11-20 – 2022-11-24 (×9): 1 via ORAL
  Filled 2022-11-20 (×9): qty 1

## 2022-11-20 MED ORDER — ENSURE ENLIVE PO LIQD
237.0000 mL | Freq: Two times a day (BID) | ORAL | Status: DC
  Administered 2022-11-20 – 2022-11-24 (×9): 237 mL via ORAL
  Filled 2022-11-20: qty 237

## 2022-11-20 MED ORDER — LACTATED RINGERS IV SOLN: INTRAVENOUS | Status: DC

## 2022-11-20 MED ORDER — PHENYLEPHRINE 80 MCG/ML (10ML) SYRINGE FOR IV PUSH (FOR BLOOD PRESSURE SUPPORT)
PREFILLED_SYRINGE | INTRAVENOUS | Status: DC | PRN
  Administered 2022-11-20 (×3): 200 ug via INTRAVENOUS
  Administered 2022-11-20: 160 ug via INTRAVENOUS
  Administered 2022-11-20: 200 ug via INTRAVENOUS
  Administered 2022-11-20: 80 ug via INTRAVENOUS
  Administered 2022-11-20: 200 ug via INTRAVENOUS

## 2022-11-20 MED ORDER — CEFAZOLIN SODIUM-DEXTROSE 2-4 GM/100ML-% IV SOLN
2.0000 g | Freq: Three times a day (TID) | INTRAVENOUS | Status: AC
  Administered 2022-11-21 (×2): 2 g via INTRAVENOUS
  Filled 2022-11-20 (×3): qty 100

## 2022-11-20 MED ORDER — ONDANSETRON HCL 4 MG/2ML IJ SOLN: 4.0000 mg | Freq: Four times a day (QID) | INTRAMUSCULAR | Status: DC | PRN

## 2022-11-20 MED ORDER — ENOXAPARIN SODIUM 40 MG/0.4ML IJ SOSY
40.0000 mg | PREFILLED_SYRINGE | INTRAMUSCULAR | Status: DC
  Administered 2022-11-21 – 2022-11-24 (×4): 40 mg via SUBCUTANEOUS
  Filled 2022-11-20 (×4): qty 0.4

## 2022-11-20 MED ORDER — FENTANYL CITRATE (PF) 100 MCG/2ML IJ SOLN
25.0000 ug | INTRAMUSCULAR | Status: DC | PRN
  Administered 2022-11-20 (×2): 50 ug via INTRAVENOUS

## 2022-11-20 MED ORDER — ROCURONIUM BROMIDE 10 MG/ML (PF) SYRINGE
PREFILLED_SYRINGE | INTRAVENOUS | Status: DC | PRN
  Administered 2022-11-20: 60 mg via INTRAVENOUS
  Administered 2022-11-20: 40 mg via INTRAVENOUS

## 2022-11-20 MED ORDER — POVIDONE-IODINE 10 % EX SWAB: 2.0000 | Freq: Once | CUTANEOUS | Status: DC

## 2022-11-20 MED ORDER — KETAMINE HCL 50 MG/5ML IJ SOSY
PREFILLED_SYRINGE | INTRAMUSCULAR | Status: AC
  Filled 2022-11-20: qty 5

## 2022-11-20 MED ORDER — LIDOCAINE 2% (20 MG/ML) 5 ML SYRINGE
INTRAMUSCULAR | Status: DC | PRN
  Administered 2022-11-20: 60 mg via INTRAVENOUS

## 2022-11-20 MED ORDER — MIDAZOLAM HCL 2 MG/2ML IJ SOLN
INTRAMUSCULAR | Status: DC | PRN
  Administered 2022-11-20: 2 mg via INTRAVENOUS

## 2022-11-20 MED ORDER — ADULT MULTIVITAMIN W/MINERALS CH
1.0000 | ORAL_TABLET | Freq: Every day | ORAL | Status: DC
  Administered 2022-11-20 – 2022-11-24 (×5): 1 via ORAL
  Filled 2022-11-20 (×6): qty 1

## 2022-11-20 MED ORDER — AMISULPRIDE (ANTIEMETIC) 5 MG/2ML IV SOLN: 10.0000 mg | Freq: Once | INTRAVENOUS | Status: DC | PRN

## 2022-11-20 MED ORDER — TRANEXAMIC ACID-NACL 1000-0.7 MG/100ML-% IV SOLN
1000.0000 mg | INTRAVENOUS | Status: AC
  Administered 2022-11-20: 1000 mg via INTRAVENOUS

## 2022-11-20 MED ORDER — VANCOMYCIN HCL 1000 MG IV SOLR
INTRAVENOUS | Status: AC
  Filled 2022-11-20: qty 20

## 2022-11-20 MED ORDER — POLYETHYLENE GLYCOL 3350 17 G PO PACK
17.0000 g | PACK | Freq: Every day | ORAL | Status: DC
  Administered 2022-11-21 – 2022-11-22 (×2): 17 g via ORAL
  Filled 2022-11-20 (×2): qty 1

## 2022-11-20 MED ORDER — ORAL CARE MOUTH RINSE: 15.0000 mL | Freq: Once | OROMUCOSAL | Status: AC

## 2022-11-20 MED ORDER — SODIUM CHLORIDE 0.9 % IV SOLN: INTRAVENOUS | Status: DC

## 2022-11-20 MED ORDER — ONDANSETRON HCL 4 MG PO TABS: 4.0000 mg | ORAL_TABLET | Freq: Four times a day (QID) | ORAL | Status: DC | PRN

## 2022-11-20 MED ORDER — MIDAZOLAM HCL 2 MG/2ML IJ SOLN
INTRAMUSCULAR | Status: AC
  Filled 2022-11-20: qty 2

## 2022-11-20 MED ORDER — PROPOFOL 10 MG/ML IV BOLUS
INTRAVENOUS | Status: AC
  Filled 2022-11-20: qty 20

## 2022-11-20 MED ORDER — LIDOCAINE 2% (20 MG/ML) 5 ML SYRINGE
INTRAMUSCULAR | Status: AC
  Filled 2022-11-20: qty 5

## 2022-11-20 MED ORDER — DEXAMETHASONE SODIUM PHOSPHATE 10 MG/ML IJ SOLN
INTRAMUSCULAR | Status: AC
  Filled 2022-11-20: qty 1

## 2022-11-20 MED ORDER — ACETAMINOPHEN 10 MG/ML IV SOLN
INTRAVENOUS | Status: DC | PRN
  Administered 2022-11-20: 1000 mg via INTRAVENOUS

## 2022-11-20 MED ORDER — 0.9 % SODIUM CHLORIDE (POUR BTL) OPTIME
TOPICAL | Status: DC | PRN
  Administered 2022-11-20: 1000 mL

## 2022-11-20 MED ORDER — ROCURONIUM BROMIDE 10 MG/ML (PF) SYRINGE
PREFILLED_SYRINGE | INTRAVENOUS | Status: AC
  Filled 2022-11-20: qty 10

## 2022-11-20 MED ORDER — ACETAMINOPHEN 10 MG/ML IV SOLN
INTRAVENOUS | Status: AC
  Filled 2022-11-20: qty 100

## 2022-11-20 MED ORDER — INSULIN ASPART 100 UNIT/ML IJ SOLN: 0.0000 [IU] | INTRAMUSCULAR | Status: DC | PRN

## 2022-11-20 MED ORDER — VANCOMYCIN HCL 1000 MG IV SOLR
INTRAVENOUS | Status: DC | PRN
  Administered 2022-11-20: 1000 mg via TOPICAL

## 2022-11-20 MED ORDER — PHENYLEPHRINE HCL-NACL 20-0.9 MG/250ML-% IV SOLN
INTRAVENOUS | Status: DC | PRN
  Administered 2022-11-20: 50 ug/min via INTRAVENOUS

## 2022-11-20 MED ORDER — DOCUSATE SODIUM 100 MG PO CAPS
100.0000 mg | ORAL_CAPSULE | Freq: Two times a day (BID) | ORAL | Status: DC
  Administered 2022-11-20 – 2022-11-24 (×9): 100 mg via ORAL
  Filled 2022-11-20 (×9): qty 1

## 2022-11-20 MED ORDER — FENTANYL CITRATE (PF) 250 MCG/5ML IJ SOLN
INTRAMUSCULAR | Status: AC
  Filled 2022-11-20: qty 5

## 2022-11-20 MED ORDER — FENTANYL CITRATE (PF) 100 MCG/2ML IJ SOLN
INTRAMUSCULAR | Status: AC
  Filled 2022-11-20: qty 2

## 2022-11-20 MED ORDER — KETAMINE HCL 10 MG/ML IJ SOLN
INTRAMUSCULAR | Status: DC | PRN
  Administered 2022-11-20: 30 mg via INTRAVENOUS

## 2022-11-20 SURGICAL SUPPLY — 78 items
ADH SKN CLS APL DERMABOND .7 (GAUZE/BANDAGES/DRESSINGS) ×1
ADH SKNCLS APL OCTYL .7 VIOL (GAUZE/BANDAGES/DRESSINGS) ×1
APL PRP STRL LF DISP 70% ISPRP (MISCELLANEOUS) ×1
BAG COUNTER SPONGE SURGICOUNT (BAG) ×1 IMPLANT
BAG SPNG CNTER NS LX DISP (BAG) ×1
BIT DRILL 4.3 (BIT) ×1
BIT DRILL 4.3X300MM (BIT) IMPLANT
BIT DRILL LONG 3.3 (BIT) IMPLANT
BIT DRILL QC 3.3X195 (BIT) IMPLANT
BLADE CLIPPER SURG (BLADE) IMPLANT
BNDG CMPR 5X6 CHSV STRCH STRL (GAUZE/BANDAGES/DRESSINGS)
BNDG CMPR MED 10X6 ELC LF (GAUZE/BANDAGES/DRESSINGS) ×1
BNDG COHESIVE 6X5 TAN ST LF (GAUZE/BANDAGES/DRESSINGS) ×1 IMPLANT
BNDG ELASTIC 6X10 VLCR STRL LF (GAUZE/BANDAGES/DRESSINGS) ×1 IMPLANT
BRUSH SCRUB EZ PLAIN DRY (MISCELLANEOUS) ×2 IMPLANT
CANISTER SUCT 3000ML PPV (MISCELLANEOUS) ×1 IMPLANT
CAP LOCK NCB (Cap) IMPLANT
CHLORAPREP W/TINT 26 (MISCELLANEOUS) ×1 IMPLANT
COVER SURGICAL LIGHT HANDLE (MISCELLANEOUS) ×1 IMPLANT
DERMABOND ADVANCED .7 DNX12 (GAUZE/BANDAGES/DRESSINGS) IMPLANT
DRAPE C-ARM 42X72 X-RAY (DRAPES) ×1 IMPLANT
DRAPE C-ARMOR (DRAPES) ×1 IMPLANT
DRAPE HALF SHEET 40X57 (DRAPES) ×2 IMPLANT
DRAPE ORTHO SPLIT 77X108 STRL (DRAPES) ×2
DRAPE SURG 17X23 STRL (DRAPES) ×1 IMPLANT
DRAPE SURG ORHT 6 SPLT 77X108 (DRAPES) ×2 IMPLANT
DRAPE U-SHAPE 47X51 STRL (DRAPES) ×1 IMPLANT
DRESSING MEPILEX FLEX 4X4 (GAUZE/BANDAGES/DRESSINGS) IMPLANT
DRSG ADAPTIC 3X8 NADH LF (GAUZE/BANDAGES/DRESSINGS) IMPLANT
DRSG MEPILEX FLEX 4X4 (GAUZE/BANDAGES/DRESSINGS)
DRSG MEPILEX POST OP 4X12 (GAUZE/BANDAGES/DRESSINGS) IMPLANT
DRSG MEPILEX POST OP 4X8 (GAUZE/BANDAGES/DRESSINGS) IMPLANT
ELECT REM PT RETURN 9FT ADLT (ELECTROSURGICAL) ×1
ELECTRODE REM PT RTRN 9FT ADLT (ELECTROSURGICAL) ×1 IMPLANT
GAUZE PAD ABD 8X10 STRL (GAUZE/BANDAGES/DRESSINGS) ×3 IMPLANT
GAUZE SPONGE 4X4 12PLY STRL (GAUZE/BANDAGES/DRESSINGS) ×1 IMPLANT
GLOVE BIO SURGEON STRL SZ 6.5 (GLOVE) ×3 IMPLANT
GLOVE BIO SURGEON STRL SZ7.5 (GLOVE) ×4 IMPLANT
GLOVE BIOGEL PI IND STRL 6.5 (GLOVE) ×1 IMPLANT
GLOVE BIOGEL PI IND STRL 7.5 (GLOVE) ×1 IMPLANT
GOWN STRL REUS W/ TWL LRG LVL3 (GOWN DISPOSABLE) ×3 IMPLANT
GOWN STRL REUS W/TWL LRG LVL3 (GOWN DISPOSABLE) ×3
K-WIRE 2.0 (WIRE) ×1
K-WIRE FXSTD 280X2XNS SS (WIRE) ×1
KIT BASIN OR (CUSTOM PROCEDURE TRAY) ×1 IMPLANT
KIT TURNOVER KIT B (KITS) ×1 IMPLANT
KWIRE FXSTD 280X2XNS SS (WIRE) IMPLANT
NS IRRIG 1000ML POUR BTL (IV SOLUTION) ×1 IMPLANT
PACK TOTAL JOINT (CUSTOM PROCEDURE TRAY) ×1 IMPLANT
PAD ARMBOARD 7.5X6 YLW CONV (MISCELLANEOUS) ×2 IMPLANT
PAD CAST 4YDX4 CTTN HI CHSV (CAST SUPPLIES) ×1 IMPLANT
PADDING CAST ABS COTTON 4X4 ST (CAST SUPPLIES) IMPLANT
PADDING CAST COTTON 4X4 STRL (CAST SUPPLIES)
PADDING CAST COTTON 6X4 STRL (CAST SUPPLIES) ×1 IMPLANT
PLATE FEM DIST NCB PP 278MM (Plate) IMPLANT
SCREW 5.0 70MM (Screw) IMPLANT
SCREW 5.0 80MM (Screw) IMPLANT
SCREW CORTICAL NCB 5.0X40 (Screw) IMPLANT
SCREW NCB 3.5X75X5X6.2XST (Screw) IMPLANT
SCREW NCB 4.0MX38M (Screw) IMPLANT
SCREW NCB 5.0X38 (Screw) IMPLANT
SCREW NCB 5.0X75MM (Screw) ×1 IMPLANT
SCREW NCB 5.0X85MM (Screw) IMPLANT
SPONGE T-LAP 18X18 ~~LOC~~+RFID (SPONGE) IMPLANT
STAPLER VISISTAT 35W (STAPLE) ×1 IMPLANT
SUCTION TUBE FRAZIER 10FR DISP (SUCTIONS) ×1 IMPLANT
SUT ETHILON 3 0 PS 1 (SUTURE) ×2 IMPLANT
SUT MNCRL AB 4-0 PS2 18 (SUTURE) IMPLANT
SUT MON AB 2-0 CT1 36 (SUTURE) IMPLANT
SUT VIC AB 0 CT1 27 (SUTURE) ×1
SUT VIC AB 0 CT1 27XBRD ANBCTR (SUTURE) IMPLANT
SUT VIC AB 1 CT1 27 (SUTURE)
SUT VIC AB 1 CT1 27XBRD ANBCTR (SUTURE) IMPLANT
SUT VIC AB 2-0 CT1 27 (SUTURE)
SUT VIC AB 2-0 CT1 TAPERPNT 27 (SUTURE) ×2 IMPLANT
TOWEL GREEN STERILE (TOWEL DISPOSABLE) ×2 IMPLANT
TRAY FOLEY MTR SLVR 16FR STAT (SET/KITS/TRAYS/PACK) IMPLANT
WATER STERILE IRR 1000ML POUR (IV SOLUTION) ×2 IMPLANT

## 2022-11-20 NOTE — Progress Notes (Signed)
Cliffton Asters NP [facility] informed that bladder scan for 760 ml and patient states that he does have hx of urine retention and he is feeling some discomfort

## 2022-11-20 NOTE — Progress Notes (Signed)
Patient wont sit still

## 2022-11-20 NOTE — Anesthesia Procedure Notes (Signed)
Procedure Name: Intubation Date/Time: 11/20/2022 12:58 PM  Performed by: Rosiland Oz, CRNAPre-anesthesia Checklist: Patient identified, Emergency Drugs available, Suction available, Patient being monitored and Timeout performed Patient Re-evaluated:Patient Re-evaluated prior to induction Oxygen Delivery Method: Circle system utilized Preoxygenation: Pre-oxygenation with 100% oxygen Induction Type: IV induction Ventilation: Mask ventilation without difficulty Laryngoscope Size: Glidescope and 4 Grade View: Grade II Tube type: Oral Tube size: 7.5 mm Number of attempts: 1 Airway Equipment and Method: Stylet Placement Confirmation: ETT inserted through vocal cords under direct vision, positive ETCO2 and breath sounds checked- equal and bilateral Secured at: 22 cm Tube secured with: Tape Dental Injury: Teeth and Oropharynx as per pre-operative assessment

## 2022-11-20 NOTE — Consult Note (Signed)
Reason for Consult:Right distal femur fx Referring Physician: Daniel Thompson Time called: 0730 Time at bedside: 0851   Michael Rose is an 72 y.o. male.  HPI: Michael Rose fell twice yesterday morning. The first time he was bending over to pick up a tool, lost his balance, and fell on his left knee. He was able to stand and took a couple of steps then fell again, this time on his right knee. He had immediate pain and could not get up. He was brought to [facility] where x-rays showed a periprosthetic distal femur fx. Orthopedic surgery was consulted and he was transferred to [facility] for definitve care.  Past Medical History:  Diagnosis Date   Allergic reaction to contrast dye    Bipolar affective disorder (HCC)    Coronary atherosclerosis of native coronary artery    BMS to RCA 2008, [facility], Dr. 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 He has never used smokeless tobacco. He reports that he does not drink alcohol and does not use drugs.  Allergies:  Allergies  Allergen Reactions   Iodinated Contrast Media Hives and Itching   Penicillins Other (See Comments)    chillhood allergy     Medications: I have reviewed the patient's current medications.  Results for orders placed or performed during the  hospital encounter of 11/19/22 (from the past 48 hour(s))  CBC with Differential     Status: Abnormal   Collection Time: 11/19/22  3:35 PM  Result Value Ref Range   WBC 12.1 (H) 4.0 - 10.5 K/uL   RBC 5.60 4.22 - 5.81 MIL/uL   Hemoglobin 14.5 13.0 - 17.0 g/dL   HCT 44.7 39.0 - 52.0 %   MCV 79.8 (L) 80.0 - 100.0 fL   MCH 25.9 (L) 26.0 - 34.0 pg   MCHC 32.4 30.0 - 36.0 g/dL   RDW 14.9 11.5 - 15.5 %   Platelets 184 150 - 400 K/uL   nRBC 0.0 0.0 - 0.2 %   Neutrophils Relative % 81 %   Neutro Abs 9.6 (H) 1.7 - 7.7 K/uL   Lymphocytes Relative 9 %   Lymphs Abs 1.1 0.7 - 4.0 K/uL   Monocytes Relative 10 %   Monocytes Absolute 1.3 (H) 0.1 - 1.0 K/uL   Eosinophils Relative 0 %   Eosinophils Absolute 0.0 0.0 - 0.5 K/uL   Basophils Relative 0 %   Basophils Absolute 0.0 0.0 - 0.1 K/uL   Immature Granulocytes 0 %   Abs Immature Granulocytes 0.04 0.00 - 0.07 K/uL    Comment: Performed at [facility], [address] [place], [place] [address]    Basic metabolic panel     Status: Abnormal   Collection Time: 11/19/22  3:35 PM  Result Value Ref Range   Sodium 140 135 - 145 mmol/L   Potassium 3.6 3.5 - 5.1 mmol/L   Chloride 104 98 - 111 mmol/L   CO2 23 22 - 32 mmol/L   Glucose, Bld 113 (H) 70 - 99 mg/dL    Comment: Glucose reference range applies only to samples taken after fasting for at least 8 hours.   BUN 19 8 - 23 mg/dL   Creatinine, Ser 0.86 0.61 - 1.24 mg/dL   Calcium 8.7 (L) 8.9 - 10.3 mg/dL   GFR, Estimated >60 >60 mL/min    Comment: (NOTE) Calculated using the CKD-EPI Creatinine Equation (2021)    Anion gap 13 5 - 15    Comment: Performed at  Hospital, [address] [place], [place] [address]  Hemoglobin A1c     Status: Abnormal   Collection Time: 11/19/22  3:35 PM  Result Value Ref Range   Hgb A1c MFr Bld 6.8 (H) 4.8 - 5.6 %    Comment: (NOTE) Pre diabetes:          5.7%-6.4%  Diabetes:              >6.4%  Glycemic control for   <7.0% adults with diabetes    Mean  Plasma Glucose 148.46 mg/dL    Comment: Performed at [facility], [address] [place], [place] [address]  Glucose, capillary     Status: None   Collection Time: 11/19/22  9:11 PM  Result Value Ref Range   Glucose-Capillary 91 70 - 99 mg/dL    Comment: Glucose reference range applies only to samples taken after fasting for at least 8 hours.  CBC     Status: Abnormal   Collection Time: 11/20/22  6:25 AM  Result Value Ref Range   WBC 7.0 4.0 - 10.5 K/uL   RBC 4.91 4.22 - 5.81 MIL/uL   Hemoglobin 12.6 (L) 13.0 - 17.0 g/dL   HCT 39.3 39.0 - 52.0 %   MCV 80.0 80.0 - 100.0 fL   MCH 25.7 (L) 26.0 - 34.0 pg   MCHC 32.1 30.0 - 36.0 g/dL   RDW 15.0 11.5 - 15.5 %   Platelets 175 150 - 400 K/uL   nRBC 0.0 0.0 - 0.2 %    Comment: Performed at [facility], [address] [place], [place] [address]  Glucose, capillary     Status: Abnormal   Collection Time: 11/20/22  7:30 AM  Result Value Ref Range   Glucose-Capillary 170 (H) 70 - 99 mg/dL    Comment: Glucose reference range applies only to samples taken after fasting for at least 8 hours.    Chest Portable 1 View  Result Date: 11/19/2022 CLINICAL DATA:  Preop for femoral fracture. EXAM: PORTABLE CHEST 1 VIEW COMPARISON:  Oct 13, 2012. FINDINGS: Stable cardiomediastinal silhouette. Left lung is clear. Minimal right basilar subsegmental atelectasis is noted with small right pleural effusion. Bony thorax is unremarkable. IMPRESSION: Minimal right basilar subsegmental atelectasis is noted with small right pleural effusion. Electronically Signed   By: James  Green Jr M.D.   On: 11/19/2022 18:53   DG Knee 2 Views Right  Result Date: 11/19/2022 CLINICAL DATA:  Trauma, fall, pain EXAM: RIGHT KNEE - 1-2 VIEW COMPARISON:  None Available. FINDINGS: There is previous right knee arthroplasty. There is severely comminuted fracture in the distal shaft of femur. There   is 10 mm offset in alignment of fracture fragments along the lateral aspect.  There is overriding of fracture fragments. Arterial calcifications are seen in soft tissues. IMPRESSION: Previous right knee arthroplasty. Recent comminuted displaced fracture is seen in the distal shaft of right femur. Arteriosclerosis. Electronically Signed   By: Palani  Rathinasamy M.D.   On: 11/19/2022 16:47   DG Knee Complete 4 Views Left  Result Date: 11/19/2022 CLINICAL DATA:  Fall.  Knee replacement 8 years ago. EXAM: LEFT KNEE - COMPLETE 4+ VIEW COMPARISON:  Radiographs dated March 16, 2008 FINDINGS: Status post left knee arthroplasty with intact hardware. No periprosthetic loosening or fracture. Prominent vascular calcification. Heterotopic ossification about the superior aspect of the patella. IMPRESSION: Status post left knee arthroplasty with intact hardware. No periprosthetic loosening or fracture. Electronically Signed   By: Imran  Ahmed D.O.   On: 11/19/2022 12:12    Review of Systems  HENT:  Negative for ear discharge, ear pain, hearing loss and tinnitus.   Eyes:  Negative for photophobia and pain.  Respiratory:  Negative for cough and shortness of breath.   Cardiovascular:  Negative for chest pain.  Gastrointestinal:  Negative for abdominal pain, nausea and vomiting.  Genitourinary:  Negative for dysuria, flank pain, frequency and urgency.  Musculoskeletal:  Positive for arthralgias (Right knee). Negative for back pain, myalgias and neck pain.  Neurological:  Negative for dizziness and headaches.  Hematological:  Does not bruise/bleed easily.  Psychiatric/Behavioral:  The patient is not nervous/anxious.    Blood pressure 98/62, pulse 94, temperature 98 F (36.7 C), resp. rate 20, height 5' 11" (1.803 m), weight 103.9 kg, SpO2 92 %. Physical Exam Constitutional:      General: He is not in acute distress.    Appearance: He is well-developed. He is not diaphoretic.  HENT:     Head: Normocephalic and atraumatic.  Eyes:     General: No scleral icterus.       Right eye: No  discharge.        Left eye: No discharge.     Conjunctiva/sclera: Conjunctivae normal.  Cardiovascular:     Rate and Rhythm: Normal rate and regular rhythm.  Pulmonary:     Effort: Pulmonary effort is normal. No respiratory distress.  Musculoskeletal:     Cervical back: Normal range of motion.     Comments: RLE No traumatic wounds, ecchymosis, or rash  Mod TTP knee  Mod knee effusion  Sens DPN, SPN, TN intact  Motor EHL, ext, flex, evers 5/5  DP 1+, PT 0, No significant edema  Skin:    General: Skin is warm and dry.  Neurological:     Mental Status: He is alert.  Psychiatric:        Mood and Affect: Mood normal.        Behavior: Behavior normal.     Assessment/Plan: Right distal femur fx -- Plan ORIF today with Dr. Haddix. Please keep NPO. 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 Cassaundra Rasch, PA-C Orthopedic Surgery [phone] 11/20/2022, 9:00 AM  

## 2022-11-20 NOTE — Progress Notes (Signed)
Initial Nutrition Assessment  DOCUMENTATION CODES:   Not applicable  INTERVENTION:  Ensure Enlive po BID, each supplement provides 350 kcal and 20 grams of protein. MVI with minerals daily Monitor for diet tolerance and nutritional adequacy of oral intake  NUTRITION DIAGNOSIS:   Increased nutrient needs related to hip fracture as evidenced by estimated needs.  GOAL:   Patient will meet greater than or equal to 90% of their needs  MONITOR:   PO intake, Supplement acceptance, Labs, Skin, Weight trends  REASON FOR ASSESSMENT:   Consult Hip fracture protocol  ASSESSMENT:   Pt admitted after a fall leading to periprosthetic R distal femur fracture. PMH significant for CAD s/p stenting (2008), bilateral knee OA s/p bilateral TKR with chronic ambulation dysfunction, HTN, T2DM, HLD, anxiety/depression, GERD   NPO for ORIF today.   Pt being taken off unit at time of visit. Unable to obtain detailed nutrition related history at this time.   Reviewed weight history. Pt's weight noted to have been gradually declining over the last several years. No significant recent weight loss noted.  Current weight: 103.8 kg  Medications: SSI 0-15 units TID, SSI 0-5 units at bedtime, remeron, protonix, miralax, senna, LR @ 47ml/hr  Labs: CBG's 91-170 x24 hours, HgbA1c 6.8%  NUTRITION - FOCUSED PHYSICAL EXAM: Deferred to follow up d/t pt being taken for surgery.   Diet Order:   Diet Order             Diet NPO time specified Except for: Sips with Meds  Diet effective now                   EDUCATION NEEDS:   No education needs have been identified at this time  Skin:  Skin Assessment: Reviewed RN Assessment (R leg closed incision)  Last BM:  PTA  Height:   Ht Readings from Last 1 Encounters:  11/20/22 5\' 11"  (1.803 m)    Weight:   Wt Readings from Last 1 Encounters:  11/20/22 103.8 kg    Ideal Body Weight:  78.2 kg  BMI:  Body mass index is 31.92  kg/m.  Estimated Nutritional Needs:   Kcal:  2000-2200  Protein:  100-115g  Fluid:  >/=2L  Drusilla Kanner, RDN, LDN Clinical Nutrition

## 2022-11-20 NOTE — TOC CAGE-AID Note (Signed)
Transition of Care [facility]) - CAGE-AID Screening   Patient Details  Name: CHESKY HEYER MRN: [id number] Date of Birth: 08/27/1950  Transition of Care [facility]) CM/SW Contact:    Katha Hamming, RN Phone Number: 11/20/2022, 8:21 PM   CAGE-AID Screening:    Have You Ever Felt You Ought to Cut Down on Your Drinking or Drug Use?: No Have People Annoyed You By Critizing Your Drinking Or Drug Use?: No Have You Felt Bad Or Guilty About Your Drinking Or Drug Use?: No Have You Ever Had a Drink or Used Drugs First Thing In The Morning to Steady Your Nerves or to Get Rid of a Hangover?: No CAGE-AID Score: 0  Substance Abuse Education Offered: No (denies drug/alcohol use, no resources indicated)

## 2022-11-20 NOTE — Interval H&P Note (Signed)
History and Physical Interval Note:  11/20/2022 12:01 PM  Michael Rose  has presented today for surgery, with the diagnosis of right peri-prosthetic distal femur fracture.  The various methods of treatment have been discussed with the patient and family. After consideration of risks, benefits and other options for treatment, the patient has consented to  Procedure(s): OPEN REDUCTION INTERNAL FIXATION (ORIF) DISTAL FEMUR FRACTURE (Right) as a surgical intervention.  The patient's history has been reviewed, patient examined, no change in status, stable for surgery.  I have reviewed the patient's chart and labs.  Questions were answered to the patient's satisfaction.     Caryn Bee P Edsel Shives

## 2022-11-20 NOTE — H&P (View-Only) (Signed)
Reason for Consult:Right distal femur fx Referring Physician: Ramiro Harvest Time called: 0730 Time at bedside: 0851   Michael Rose is an 72 y.o. male.  HPI: [place] fell twice yesterday morning. The first time he was bending over to pick up a tool, lost his balance, and fell on his left knee. He was able to stand and took a couple of steps then fell again, this time on his right knee. He had immediate pain and could not get up. He was brought to [facility] where x-rays showed a periprosthetic distal femur fx. Orthopedic surgery was consulted and he was transferred to [facility] for definitve care.  Past Medical History:  Diagnosis Date   Allergic reaction to contrast dye    Bipolar affective disorder (HCC)    Coronary atherosclerosis of native coronary artery    BMS to RCA 2008, [facility], Dr. Katrinka Blazing   GERD (gastroesophageal reflux disease)    Hard of hearing    Right ear   Hyperlipidemia    Hypertension    Myocardial infarction [facility])    IMI 2008   Osteoarthritis    Ringing in right ear     Past Surgical History:  Procedure Laterality Date   CHOLECYSTECTOMY  2013   JOINT REPLACEMENT     TOTAL KNEE ARTHROPLASTY     Left   TOTAL KNEE ARTHROPLASTY Right 10/21/2012   Dr Madelon Lips   TOTAL KNEE ARTHROPLASTY Right 10/21/2012   Procedure: TOTAL KNEE ARTHROPLASTY;  Surgeon: Thera Flake., MD;  Location: [facility] OR;  Service: Orthopedics;  Laterality: Right;   VASECTOMY      Family History  Problem Relation Age of Onset   Heart disease Mother    Heart disease Father     Social History:  reports that he has never smoked. He has never used smokeless tobacco. He reports that he does not drink alcohol and does not use drugs.  Allergies:  Allergies  Allergen Reactions   Iodinated Contrast Media Hives and Itching   Penicillins Other (See Comments)    chillhood allergy     Medications: I have reviewed the patient's current medications.  Results for orders placed or performed during the  hospital encounter of 11/19/22 (from the past 48 hour(s))  CBC with Differential     Status: Abnormal   Collection Time: 11/19/22  3:35 PM  Result Value Ref Range   WBC 12.1 (H) 4.0 - 10.5 K/uL   RBC 5.60 4.22 - 5.81 MIL/uL   Hemoglobin 14.5 13.0 - 17.0 g/dL   HCT 16.1 09.6 - 04.5 %   MCV 79.8 (L) 80.0 - 100.0 fL   MCH 25.9 (L) 26.0 - 34.0 pg   MCHC 32.4 30.0 - 36.0 g/dL   RDW 40.9 81.1 - 91.4 %   Platelets 184 150 - 400 K/uL   nRBC 0.0 0.0 - 0.2 %   Neutrophils Relative % 81 %   Neutro Abs 9.6 (H) 1.7 - 7.7 K/uL   Lymphocytes Relative 9 %   Lymphs Abs 1.1 0.7 - 4.0 K/uL   Monocytes Relative 10 %   Monocytes Absolute 1.3 (H) 0.1 - 1.0 K/uL   Eosinophils Relative 0 %   Eosinophils Absolute 0.0 0.0 - 0.5 K/uL   Basophils Relative 0 %   Basophils Absolute 0.0 0.0 - 0.1 K/uL   Immature Granulocytes 0 %   Abs Immature Granulocytes 0.04 0.00 - 0.07 K/uL    Comment: Performed at [facility], [address], [place], [place] [address]  Basic metabolic panel     Status: Abnormal   Collection Time: 11/19/22  3:35 PM  Result Value Ref Range   Sodium 140 135 - 145 mmol/L   Potassium 3.6 3.5 - 5.1 mmol/L   Chloride 104 98 - 111 mmol/L   CO2 23 22 - 32 mmol/L   Glucose, Bld 113 (H) 70 - 99 mg/dL    Comment: Glucose reference range applies only to samples taken after fasting for at least 8 hours.   BUN 19 8 - 23 mg/dL   Creatinine, Ser 3.23 0.61 - 1.24 mg/dL   Calcium 8.7 (L) 8.9 - 10.3 mg/dL   GFR, Estimated >55 >73 mL/min    Comment: (NOTE) Calculated using the CKD-EPI Creatinine Equation (2021)    Anion gap 13 5 - 15    Comment: Performed at [facility], [address] [place], [place] [address]  Hemoglobin A1c     Status: Abnormal   Collection Time: 11/19/22  3:35 PM  Result Value Ref Range   Hgb A1c MFr Bld 6.8 (H) 4.8 - 5.6 %    Comment: (NOTE) Pre diabetes:          5.7%-6.4%  Diabetes:              >6.4%  Glycemic control for   <7.0% adults with diabetes    Mean  Plasma Glucose 148.46 mg/dL    Comment: Performed at [facility], [address] [place], [place] [address]  Glucose, capillary     Status: None   Collection Time: 11/19/22  9:11 PM  Result Value Ref Range   Glucose-Capillary 91 70 - 99 mg/dL    Comment: Glucose reference range applies only to samples taken after fasting for at least 8 hours.  CBC     Status: Abnormal   Collection Time: 11/20/22  6:25 AM  Result Value Ref Range   WBC 7.0 4.0 - 10.5 K/uL   RBC 4.91 4.22 - 5.81 MIL/uL   Hemoglobin 12.6 (L) 13.0 - 17.0 g/dL   HCT 23.7 62.8 - 31.5 %   MCV 80.0 80.0 - 100.0 fL   MCH 25.7 (L) 26.0 - 34.0 pg   MCHC 32.1 30.0 - 36.0 g/dL   RDW 17.6 16.0 - 73.7 %   Platelets 175 150 - 400 K/uL   nRBC 0.0 0.0 - 0.2 %    Comment: Performed at [facility], [address], [place], [place] [address]  Glucose, capillary     Status: Abnormal   Collection Time: 11/20/22  7:30 AM  Result Value Ref Range   Glucose-Capillary 170 (H) 70 - 99 mg/dL    Comment: Glucose reference range applies only to samples taken after fasting for at least 8 hours.    Chest Portable 1 View  Result Date: 11/19/2022 CLINICAL DATA:  Preop for femoral fracture. EXAM: PORTABLE CHEST 1 VIEW COMPARISON:  Oct 13, 2012. FINDINGS: Stable cardiomediastinal silhouette. Left lung is clear. Minimal right basilar subsegmental atelectasis is noted with small right pleural effusion. Bony thorax is unremarkable. IMPRESSION: Minimal right basilar subsegmental atelectasis is noted with small right pleural effusion. Electronically Signed   By: Lupita Raider M.D.   On: 11/19/2022 18:53   DG Knee 2 Views Right  Result Date: 11/19/2022 CLINICAL DATA:  Trauma, fall, pain EXAM: RIGHT KNEE - 1-2 VIEW COMPARISON:  None Available. FINDINGS: There is previous right knee arthroplasty. There is severely comminuted fracture in the distal shaft of femur. There  is 10 mm offset in alignment of fracture fragments along the lateral aspect.  There is overriding of fracture fragments. Arterial calcifications are seen in soft tissues. IMPRESSION: Previous right knee arthroplasty. Recent comminuted displaced fracture is seen in the distal shaft of right femur. Arteriosclerosis. Electronically Signed   By: Ernie Avena M.D.   On: 11/19/2022 16:47   DG Knee Complete 4 Views Left  Result Date: 11/19/2022 CLINICAL DATA:  Fall.  Knee replacement 8 years ago. EXAM: LEFT KNEE - COMPLETE 4+ VIEW COMPARISON:  Radiographs dated March 16, 2008 FINDINGS: Status post left knee arthroplasty with intact hardware. No periprosthetic loosening or fracture. Prominent vascular calcification. Heterotopic ossification about the superior aspect of the patella. IMPRESSION: Status post left knee arthroplasty with intact hardware. No periprosthetic loosening or fracture. Electronically Signed   By: Larose Hires D.O.   On: 11/19/2022 12:12    Review of Systems  HENT:  Negative for ear discharge, ear pain, hearing loss and tinnitus.   Eyes:  Negative for photophobia and pain.  Respiratory:  Negative for cough and shortness of breath.   Cardiovascular:  Negative for chest pain.  Gastrointestinal:  Negative for abdominal pain, nausea and vomiting.  Genitourinary:  Negative for dysuria, flank pain, frequency and urgency.  Musculoskeletal:  Positive for arthralgias (Right knee). Negative for back pain, myalgias and neck pain.  Neurological:  Negative for dizziness and headaches.  Hematological:  Does not bruise/bleed easily.  Psychiatric/Behavioral:  The patient is not nervous/anxious.    Blood pressure 98/62, pulse 94, temperature 98 F (36.7 C), resp. rate 20, height 5\' 11"  (1.803 m), weight 103.9 kg, SpO2 92 %. Physical Exam Constitutional:      General: He is not in acute distress.    Appearance: He is well-developed. He is not diaphoretic.  HENT:     Head: Normocephalic and atraumatic.  Eyes:     General: No scleral icterus.       Right eye: No  discharge.        Left eye: No discharge.     Conjunctiva/sclera: Conjunctivae normal.  Cardiovascular:     Rate and Rhythm: Normal rate and regular rhythm.  Pulmonary:     Effort: Pulmonary effort is normal. No respiratory distress.  Musculoskeletal:     Cervical back: Normal range of motion.     Comments: RLE No traumatic wounds, ecchymosis, or rash  Mod TTP knee  Mod knee effusion  Sens DPN, SPN, TN intact  Motor EHL, ext, flex, evers 5/5  DP 1+, PT 0, No significant edema  Skin:    General: Skin is warm and dry.  Neurological:     Mental Status: He is alert.  Psychiatric:        Mood and Affect: Mood normal.        Behavior: Behavior normal.     Assessment/Plan: Right distal femur fx -- Plan ORIF today with Dr. [name]. Please keep NPO. 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

## 2022-11-20 NOTE — Progress Notes (Signed)
Echocardiogram 2D Echocardiogram has been performed.  Michael Rose 11/20/2022, 10:35 AM

## 2022-11-20 NOTE — Transfer of Care (Signed)
Immediate Anesthesia Transfer of Care Note  Patient: Michael Rose  Procedure(s) Performed: OPEN REDUCTION INTERNAL FIXATION (ORIF) DISTAL FEMUR FRACTURE (Right: Leg Upper)  Patient Location: PACU  Anesthesia Type:General  Level of Consciousness: drowsy and patient cooperative  Airway & Oxygen Therapy: Patient Spontanous Breathing  Post-op Assessment: Report given to RN and Post -op Vital signs reviewed and stable  Post vital signs: Reviewed and stable  Last Vitals:  Vitals Value Taken Time  BP 139/100 11/20/22 1412  Temp    Pulse 99 11/20/22 1413  Resp    SpO2 92 % 11/20/22 1413  Vitals shown include unvalidated device data.  Last Pain:  Vitals:   11/20/22 1151  TempSrc:   PainSc: 9          Complications: No notable events documented.

## 2022-11-20 NOTE — Progress Notes (Signed)
PROGRESS NOTE    Michael Rose  WUJ:[id number] DOB: 1950/12/27 DOA: 11/19/2022 PCP: [name], MD    Chief Complaint  Patient presents with   Fall    Brief Narrative: Patient 72 year old gentleman history of CAD status post stenting in 2008, bilateral knee OA status post bilateral TKR with chronic ambulatory dysfunction, hypertension, type 2 diabetes, hyperlipidemia, depression/anxiety, GERD presented with a fall and bilateral knee pain.  Imaging done in the ED concerning for a right knee distal femoral shaft emanated fracture.  Orthopedics consulted.  Patient subsequently underwent ORIF of right periprosthetic distal femur fracture on 11/20/2022.   Assessment & Plan:   Principal Problem:   Femoral fracture (HCC) Active Problems:   Mixed hyperlipidemia   MORBID OBESITY   Essential hypertension, benign   CORONARY ATHEROSCLEROSIS NATIVE CORONARY ARTERY   Closed fracture of right distal femur (HCC)   Type 2 diabetes mellitus without complication, without long-term current use of insulin (HCC)   Benign prostatic hyperplasia   Anxiety and depression   Leukocytosis  #1 right periprostatic distal femur fracture -Secondary to mechanical fall. -Patient seen in consultation by orthopedics and subsequently underwent ORIF of right periprosthetic distal femur fracture today,11/20/2022. -PT/OT likely to assess patient tomorrow. -Wife concerned about how patient is going to be able to maneuver steps in the apartment complex on discharge. -Pain management, DVT prophylaxis per orthopedics. -Appreciate orthopedics input and recommendations.  2.  Hypertension -Patient on admission noted to have elevated blood pressure felt in part secondary to pain from acute fracture. -BP improved postop. -Continue home regimen Imdur, Toprol-XL.  3.  Leukocytosis -Likely reactive leukocytosis secondary to problem #1. -Leukocytosis improved. -No signs or symptoms of infection. -Follow.  4.  History of  CAD -Asymptomatic. -2D echo obtained with a EF of 55 to 60%, NWMA, mild LVH, grade 1 diastolic dysfunction. -Continue home regimen aspirin, metoprolol, statin, Imdur.  5.  Depression/anxiety -Trazodone, Remeron.  6.  Diabetes mellitus type 2 -Hemoglobin A1c 6.8 (11/19/2022) -Continue to hold Amaryl and Farxiga. -SSI.  7.  BPH -Continue home regimen Flomax. -Early on this morning there was concern for urinary retention patient subsequently underwent a I and O. -Bladder scan every 8 hours x 1 day. -Follow.   DVT prophylaxis: Postop DVT prophylaxis per orthopedics Lovenox to be started 11/21/2022 Code Status: Full Family Communication: Updated patient, wife at bedside. Disposition: TBD  Status is: Inpatient Remains inpatient appropriate because: Severity of illness   Consultants:  Orthopedics: Dr. [name] 11/20/2022  Procedures:  Chest x-ray 11/19/2022 Plain films of the right knee 11/19/2022 Plain films of the left knee 11/19/2022 Plain films of the right femur 11/20/2022 2D echo 11/20/2022 ORIF of right periprosthetic distal femur fracture: Per Dr. [name] orthopedics 11/20/2022  Antimicrobials:  Anti-infectives (From admission, onward)    Start     Dose/Rate Route Frequency Ordered Stop   11/20/22 2000  ceFAZolin (ANCEF) IVPB 2g/100 mL premix        2 g 200 mL/hr over 30 Minutes Intravenous Every 8 hours 11/20/22 1520 11/21/22 1959   11/20/22 1339  vancomycin (VANCOCIN) powder  Status:  Discontinued          As needed 11/20/22 1339 11/20/22 1407   11/20/22 1200  ceFAZolin (ANCEF) IVPB 2g/100 mL premix        2 g 200 mL/hr over 30 Minutes Intravenous On call to O.R. 11/20/22 1100 11/20/22 1305   11/20/22 1102  ceFAZolin (ANCEF) 2-4 GM/100ML-% IVPB       Note to  Pharmacy: Lurena Nida: cabinet override      11/20/22 1102 11/20/22 1314         Subjective: Laying in bed.  Slightly sedated.  Went to the OR early on today.  Denies any chest pain or shortness of breath.  No  abdominal pain.  Still with some right lower extremity pain however states improved since admission postoperatively.  Wife at bedside.  Wife concerned about patient being able to maneuver steps in the apartment complex on discharge.  Objective: Vitals:   11/20/22 1445 11/20/22 1500 11/20/22 1515 11/20/22 1535  BP: 126/74 115/73 113/60 (!) 109/55  Pulse: 98 94 96 95  Resp: 17 18 17 18   Temp:   97.9 F (36.6 C) 98 F (36.7 C)  TempSrc:    Oral  SpO2: 93% 92% 91% 93%  Weight:      Height:        Intake/Output Summary (Last 24 hours) at 11/20/2022 1737 Last data filed at 11/20/2022 1357 Gross per 24 hour  Intake 1240 ml  Output 1100 ml  Net 140 ml   Filed Weights   11/19/22 1620 11/20/22 1120  Weight: 103.9 kg 103.8 kg    Examination:  General exam: Appears calm and comfortable  Respiratory system: Clear to auscultation anterior lung fields.  No wheezes, no crackles, no rhonchi.  Fair air movement.  Speaking in full sentences.Marland Kitchen Respiratory effort normal. Cardiovascular system: S1 & S2 heard, RRR. No JVD, murmurs, rubs, gallops or clicks. No pedal edema. Gastrointestinal system: Abdomen is nondistended, soft and nontender. No organomegaly or masses felt. Normal bowel sounds heard. Central nervous system: Alert and oriented. No focal neurological deficits. Extremities: Right lower extremity in postop bandage.  Skin: No rashes, lesions or ulcers Psychiatry: Judgement and insight appear normal. Mood & affect appropriate.     Data Reviewed: I have personally reviewed following labs and imaging studies  CBC: Recent Labs  Lab 11/19/22 1535 11/20/22 0625  WBC 12.1* 7.0  NEUTROABS 9.6*  --   HGB 14.5 12.6*  HCT 44.7 39.3  MCV 79.8* 80.0  PLT 184 175    Basic Metabolic Panel: Recent Labs  Lab 11/19/22 1535  NA 140  K 3.6  CL 104  CO2 23  GLUCOSE 113*  BUN 19  CREATININE 0.86  CALCIUM 8.7*    GFR: Estimated Creatinine Clearance: 95.2 mL/min (by C-G formula based  on SCr of 0.86 mg/dL).  Liver Function Tests: No results for input(s): "AST", "ALT", "ALKPHOS", "BILITOT", "PROT", "ALBUMIN" in the last 168 hours.  CBG: Recent Labs  Lab 11/20/22 0730 11/20/22 1104 11/20/22 1120 11/20/22 1413 11/20/22 1617  GLUCAP 170* 121* 123* 127* 222*     No results found for this or any previous visit (from the past 240 hour(s)).       Radiology Studies: DG FEMUR, MIN 2 VIEWS RIGHT  Result Date: 11/20/2022 CLINICAL DATA:  Postop fracture EXAM: RIGHT FEMUR 2 VIEWS COMPARISON:  11/19/2022 FINDINGS: Vascular calcifications. Previous knee replacement. Interval surgical plate and screw fixation of the mid to distal femur across comminuted distal femoral fracture with anatomic alignment. Gas in the soft tissues consistent with recent surgery IMPRESSION: Interval surgical fixation of distal femoral fracture with expected postsurgical change Electronically Signed   By: Jasmine Pang M.D.   On: 11/20/2022 15:29   DG FEMUR, MIN 2 VIEWS RIGHT  Result Date: 11/20/2022 CLINICAL DATA:  Fluoroscopic assistance for internal fixation of fracture of distal right femur. EXAM: RIGHT FEMUR 2 VIEWS COMPARISON:  11/20/2022 FINDINGS: Fluoroscopic images show reduction and internal fixation of comminuted fracture of distal shaft of right femur. There is previous right knee arthroplasty. A metallic plate and multiple screws are used for internal fixation. Fluoroscopic time 67 seconds. Radiation dose 5.48 mGy. IMPRESSION: Fluoroscopic assistance was provided for reduction and internal fixation of comminuted fracture of distal shaft of right femur. Electronically Signed   By: Ernie Avena M.D.   On: 11/20/2022 14:22   DG C-Arm 1-60 Min-No Report  Result Date: 11/20/2022 Fluoroscopy was utilized by the requesting physician.  No radiographic interpretation.   ECHOCARDIOGRAM COMPLETE  Result Date: 11/20/2022    ECHOCARDIOGRAM REPORT   Patient Name:   Michael Rose Date of Exam:  11/20/2022 Medical Rec #:  [id number]      Height:       71.0 in Accession #:    [id number]     Weight:       229.0 lb Date of Birth:  Jun 18, 1950       BSA:          2.233 m Patient Age:    72 years       BP:           98/62 mmHg Patient Gender: M              HR:           90 bpm. Exam Location:  Inpatient Procedure: 2D Echo, Cardiac Doppler and Color Doppler Indications:    Preoperative evaluation  History:        Patient has no prior history of Echocardiogram examinations.                 Risk Factors:Hypertension and Dyslipidemia.  Sonographer:    Lucendia Herrlich Referring Phys: [id number] PING T ZHANG IMPRESSIONS  1. Left ventricular ejection fraction, by estimation, is 55 to 60%. The left ventricle has normal function. The left ventricle has no regional wall motion abnormalities. There is mild left ventricular hypertrophy. Left ventricular diastolic parameters are consistent with Grade I diastolic dysfunction (impaired relaxation).  2. Right ventricular systolic function is normal. The right ventricular size is normal. Tricuspid regurgitation signal is inadequate for assessing PA pressure.  3. The mitral valve is normal in structure. Trivial mitral valve regurgitation. No evidence of mitral stenosis.  4. The aortic valve is tricuspid. There is mild calcification of the aortic valve. There is mild thickening of the aortic valve. Aortic valve regurgitation is not visualized. No aortic stenosis is present.  5. The inferior vena cava is normal in size with greater than 50% respiratory variability, suggesting right atrial pressure of 3 mmHg. FINDINGS  Left Ventricle: Left ventricular ejection fraction, by estimation, is 55 to 60%. The left ventricle has normal function. The left ventricle has no regional wall motion abnormalities. The left ventricular internal cavity size was normal in size. There is  mild left ventricular hypertrophy. Left ventricular diastolic parameters are consistent with Grade I diastolic  dysfunction (impaired relaxation). Right Ventricle: The right ventricular size is normal. No increase in right ventricular wall thickness. Right ventricular systolic function is normal. Tricuspid regurgitation signal is inadequate for assessing PA pressure. Left Atrium: Left atrial size was normal in size. Right Atrium: Right atrial size was normal in size. Pericardium: There is no evidence of pericardial effusion. Mitral Valve: The mitral valve is normal in structure. Trivial mitral valve regurgitation. No evidence of mitral valve stenosis. Tricuspid Valve: The tricuspid valve is normal in structure. Tricuspid valve  regurgitation is trivial. No evidence of tricuspid stenosis. Aortic Valve: The aortic valve is tricuspid. There is mild calcification of the aortic valve. There is mild thickening of the aortic valve. Aortic valve regurgitation is not visualized. No aortic stenosis is present. Aortic valve peak gradient measures 10.1 mmHg. Pulmonic Valve: The pulmonic valve was normal in structure. Pulmonic valve regurgitation is not visualized. No evidence of pulmonic stenosis. Aorta: The aortic root is normal in size and structure. Venous: The inferior vena cava is normal in size with greater than 50% respiratory variability, suggesting right atrial pressure of 3 mmHg. IAS/Shunts: No atrial level shunt detected by color flow Doppler.  LEFT VENTRICLE PLAX 2D LVIDd:         4.60 cm   Diastology LVIDs:         3.40 cm   LV e' medial:    7.30 cm/s LV PW:         1.30 cm   LV E/e' medial:  6.7 LV IVS:        1.10 cm   LV e' lateral:   6.22 cm/s LVOT diam:     2.20 cm   LV E/e' lateral: 7.9 LV SV:         49 LV SV Index:   22 LVOT Area:     3.80 cm  RIGHT VENTRICLE             IVC RV S prime:     14.90 cm/s  IVC diam: 1.90 cm TAPSE (M-mode): 1.8 cm LEFT ATRIUM             Index LA diam:        3.60 cm 1.61 cm/m LA Vol (A2C):   43.0 ml 19.25 ml/m LA Vol (A4C):   39.7 ml 17.78 ml/m LA Biplane Vol: 44.7 ml 20.01 ml/m   AORTIC VALVE AV Area (Vmax): 2.05 cm AV Vmax:        159.00 cm/s AV Peak Grad:   10.1 mmHg LVOT Vmax:      85.70 cm/s LVOT Vmean:     53.900 cm/s LVOT VTI:       0.130 m  AORTA Ao Root diam: 3.60 cm Ao Asc diam:  3.10 cm MITRAL VALVE MV Area (PHT): 4.06 cm    SHUNTS MV Decel Time: 187 msec    Systemic VTI:  0.13 m MV E velocity: 49.00 cm/s  Systemic Diam: 2.20 cm MV A velocity: 80.80 cm/s MV E/A ratio:  0.61 Weston Brass MD Electronically signed by Weston Brass MD Signature Date/Time: 11/20/2022/1:17:25 PM    Final    DG FEMUR PORT, MIN 2 VIEWS RIGHT  Result Date: 11/20/2022 CLINICAL DATA:  Preop evaluation EXAM: RIGHT FEMUR PORTABLE 2 VIEW COMPARISON:  11/19/2022 FINDINGS: Comminuted distal femoral fracture is again seen on the right just above the knee prosthesis. Approximately 1/2 bone with displacement of the distal fracture fragment is noted with respect to the proximal femoral shaft. No other fracture is seen. IMPRESSION: Comminuted distal right femoral fracture. Electronically Signed   By: Alcide Clever M.D.   On: 11/20/2022 09:50   Chest Portable 1 View  Result Date: 11/19/2022 CLINICAL DATA:  Preop for femoral fracture. EXAM: PORTABLE CHEST 1 VIEW COMPARISON:  Oct 13, 2012. FINDINGS: Stable cardiomediastinal silhouette. Left lung is clear. Minimal right basilar subsegmental atelectasis is noted with small right pleural effusion. Bony thorax is unremarkable. IMPRESSION: Minimal right basilar subsegmental atelectasis is noted with small right pleural effusion. Electronically Signed  By: Lupita Raider M.D.   On: 11/19/2022 18:53   DG Knee 2 Views Right  Result Date: 11/19/2022 CLINICAL DATA:  Trauma, fall, pain EXAM: RIGHT KNEE - 1-2 VIEW COMPARISON:  None Available. FINDINGS: There is previous right knee arthroplasty. There is severely comminuted fracture in the distal shaft of femur. There is 10 mm offset in alignment of fracture fragments along the lateral aspect. There is overriding of  fracture fragments. Arterial calcifications are seen in soft tissues. IMPRESSION: Previous right knee arthroplasty. Recent comminuted displaced fracture is seen in the distal shaft of right femur. Arteriosclerosis. Electronically Signed   By: Ernie Avena M.D.   On: 11/19/2022 16:47   DG Knee Complete 4 Views Left  Result Date: 11/19/2022 CLINICAL DATA:  Fall.  Knee replacement 8 years ago. EXAM: LEFT KNEE - COMPLETE 4+ VIEW COMPARISON:  Radiographs dated March 16, 2008 FINDINGS: Status post left knee arthroplasty with intact hardware. No periprosthetic loosening or fracture. Prominent vascular calcification. Heterotopic ossification about the superior aspect of the patella. IMPRESSION: Status post left knee arthroplasty with intact hardware. No periprosthetic loosening or fracture. Electronically Signed   By: Larose Hires D.O.   On: 11/19/2022 12:12        Scheduled Meds:  aspirin  81 mg Oral Daily   atorvastatin  10 mg Oral QHS   docusate sodium  100 mg Oral BID   [START ON 11/21/2022] enoxaparin (LOVENOX) injection  40 mg Subcutaneous Q24H   feeding supplement  237 mL Oral BID BM   fentaNYL       fluvoxaMINE  100 mg Oral Q breakfast   insulin aspart  0-15 Units Subcutaneous TID WC   insulin aspart  0-5 Units Subcutaneous QHS   isosorbide mononitrate  30 mg Oral Daily   loratadine  10 mg Oral Daily   metoprolol succinate  25 mg Oral QHS   metoprolol succinate  50 mg Oral Daily   mirtazapine  30 mg Oral QHS   multivitamin with minerals  1 tablet Oral Daily   pantoprazole  40 mg Oral Daily   polyethylene glycol  17 g Oral Daily   senna-docusate  1 tablet Oral BID   tamsulosin  0.4 mg Oral QPC supper   traZODone  50 mg Oral QHS   Continuous Infusions:  sodium chloride      ceFAZolin (ANCEF) IV     methocarbamol (ROBAXIN) IV       LOS: 1 day    Time spent: 35 minutes    Ramiro Harvest, MD [facility]   To contact the attending provider between 7A-7P or  the covering provider during after hours 7P-7A, please log into the web site [email or web address] and access using universal [place] password for that web site. If you do not have the password, please call the hospital operator.  11/20/2022, 5:37 PM

## 2022-11-20 NOTE — Op Note (Signed)
Orthopaedic Surgery Operative Note (CSN: [id number] ) Date of Surgery:  11/20/2022  Admit Date: 11/19/2022   Diagnoses: Pre-Op Diagnoses: Right periprosthetic distal femur fracture  Post-Op Diagnosis: Same  Procedures: CPT 27511-Open reduction internal fixation of right periprosthetic distal femur fracture  Surgeons : Primary: Roby Lofts, MD  Assistant: Ulyses Southward, PA-C  Location: OR 3   Anesthesia: General   Antibiotics: Ancef 2g preop with 1 gm vancomycin powder placed topically   Tourniquet time: None    Estimated Blood Loss: 75 mL  Complications: None   Specimens: None   Implants: Implant Name Type Inv. Item Serial No. Manufacturer Lot No. LRB No. Used Action  PLATE FEM DIST NCB PP - [id number] Plate PLATE FEM DIST NCB PP  [employer](ORTH,TRAU,BIO,SG)  Right 1 Implanted  SCREW 5.0 - [id number] Screw SCREW 5.0  [employer](ORTH,TRAU,BIO,SG)  Right 2 Implanted  SCREW 5.0 - [id number] Screw SCREW 5.0  [employer](ORTH,TRAU,BIO,SG)  Right 1 Implanted  SCREW CORTICAL NCB 5.0X40 - [id number] Screw SCREW CORTICAL NCB 5.0X40  [employer](ORTH,TRAU,BIO,SG)  Right 1 Implanted  SCREW NCB 5.0X38 - [id number] Screw SCREW NCB 5.0X38  [employer](ORTH,TRAU,BIO,SG)  Right 1 Implanted  SCREW NCB 5.0X85MM - [id number] Screw SCREW NCB 5.0X85MM  [employer](ORTH,TRAU,BIO,SG)  Right 1 Implanted  SCREW NCB 5.0X75MM - [id number] Screw SCREW NCB 5.0X75MM  [employer](ORTH,TRAU,BIO,SG)  Right 1 Implanted  SCREW NCB 4.0MX38M - [id number] Screw SCREW NCB 4.0MX38M  [employer](ORTH,TRAU,BIO,SG)  Right 1 Implanted  CAP LOCK NCB - [id number] Cap CAP LOCK NCB  [employer](ORTH,TRAU,BIO,SG)  Right 7 Implanted     Indications for Surgery: 72 year old male who sustained a ground-level fall with a right periprosthetic distal femur fracture.  Due to the unstable nature of his injury I recommend proceeding with open reduction internal fixation.  Risks and  benefits were discussed with the patient.  Risks included but not limited to bleeding, infection, malunion, nonunion, hardware failure, hardware irritation, nerve or blood vessel injury, DVT, even the possibility anesthetic complications.  He agreed to proceed with surgery and consent was obtained.  Operative Findings: Open reduction internal fixation of right periprosthetic distal femur fracture using [employer] NCB distal femoral locking plate.  Procedure: The patient was identified in the preoperative holding area. Consent was confirmed with the patient and their family and all questions were answered. The operative extremity was marked after confirmation with the patient. he was then brought back to the operating room by our anesthesia colleagues.  He was placed under general anesthetic and carefully transferred over to radiolucent flattop table.  A bump was placed under his operative hip.  The right lower extremity was then prepped and draped in usual sterile fashion.  Timeout was performed to verify the patient, the procedure, and the extremity.  Preoperative antibiotics were dosed.  The hip and knee were flexed over a triangle and fluoroscopic imaging showed the unstable nature of his injury.  A lateral approach to the distal femur was carried down through skin and subcutaneous tissue.  I incised through the IT band and mobilized the vastus lateralis to expose the fracture site.  I then used a Cobb elevator to manipulate the fracture back into reduction as it had impacted on the anterior flange of the prosthesis.  Once I had adequate reduction I then placed a 12 hole [employer] NCB distal femoral locking plate and slid it submuscularly along the lateral aspect of the bone.  I attached it to a targeting  arm placed a K wire distally to hold the distal portion of the plate.  Proximally I then placed a 3.3 mm drill bit to align the proximal portion of the plate.  I then drilled and placed 5.0  millimeter screws distally to bring the distal portion of the plate flush to bone.  I then percutaneously placed 5.0 millimeter screws proximally in the shaft to correct the coronal alignment and bring the proximal portion of the plate again.  I then removed the 3.3 mm drill bit proximally and placed a 4.0 millimeter screw.  Locking caps were placed on the 5.0 millimeter screws.  I then returned to the distal segment after removing the targeting arm and placed 3 more 5.0 millimeter screws.  Locking caps were placed in all distal screws.  Final fluoroscopic imaging was obtained.  The incisions were copiously irrigated.  A gram of vancomycin powder was placed to the incision.  A layered closure of 0 Vicryl, 2-0 Monocryl and 3-0 Monocryl with Dermabond was used to close the skin.  Sterile dressings were applied.  The patient was then awoke from anesthesia and taken to the PACU in stable condition.  Post Op Plan/Instructions: The patient will be weightbearing as tolerated to the right lower extremity.  He will receive postoperative Ancef.  He will receive Lovenox for DVT prophylaxis and discharged on an oral DOAC.  Will have him mobilize with physical and Occupational Therapy.  I was present and performed the entire surgery.  Ulyses Southward, PA-C did assist me throughout the case. An assistant was necessary given the difficulty in approach, maintenance of reduction and ability to instrument the fracture.   Truitt Merle, MD [facility]

## 2022-11-20 NOTE — Anesthesia Preprocedure Evaluation (Signed)
Anesthesia Evaluation  Patient identified by MRN, date of birth, ID band Patient awake    Reviewed: Allergy & Precautions, NPO status , Patient's Chart, lab work & pertinent test results  Airway Mallampati: IV  TM Distance: >3 FB Neck ROM: Full  Mouth opening: Limited Mouth Opening  Dental  (+) Dental Advisory Given   Pulmonary neg pulmonary ROS   breath sounds clear to auscultation       Cardiovascular hypertension, Pt. on medications and Pt. on home beta blockers + CAD, + Past MI and + Cardiac Stents   Rhythm:Regular Rate:Normal     Neuro/Psych negative neurological ROS     GI/Hepatic Neg liver ROS,GERD  ,,  Endo/Other  diabetes, Type 2, Oral Hypoglycemic Agents    Renal/GU negative Renal ROS     Musculoskeletal  (+) Arthritis ,    Abdominal   Peds  Hematology  (+) Blood dyscrasia, anemia   Anesthesia Other Findings   Reproductive/Obstetrics                             Anesthesia Physical Anesthesia Plan  ASA: 3  Anesthesia Plan: General   Post-op Pain Management: Ofirmev IV (intra-op)* and Toradol IV (intra-op)*   Induction: Intravenous  PONV Risk Score and Plan: 2 and Dexamethasone, Ondansetron and Treatment may vary due to age or medical condition  Airway Management Planned: Oral ETT  Additional Equipment: None  Intra-op Plan:   Post-operative Plan: Extubation in OR  Informed Consent: I have reviewed the patients History and Physical, chart, labs and discussed the procedure including the risks, benefits and alternatives for the proposed anesthesia with the patient or authorized representative who has indicated his/her understanding and acceptance.     Dental advisory given  Plan Discussed with: CRNA  Anesthesia Plan Comments:        Anesthesia Quick Evaluation

## 2022-11-21 DIAGNOSIS — I10 Essential (primary) hypertension: Secondary | ICD-10-CM | POA: Diagnosis not present

## 2022-11-21 DIAGNOSIS — S72491A Other fracture of lower end of right femur, initial encounter for closed fracture: Secondary | ICD-10-CM | POA: Diagnosis not present

## 2022-11-21 DIAGNOSIS — E559 Vitamin D deficiency, unspecified: Secondary | ICD-10-CM | POA: Insufficient documentation

## 2022-11-21 DIAGNOSIS — F419 Anxiety disorder, unspecified: Secondary | ICD-10-CM | POA: Diagnosis not present

## 2022-11-21 LAB — CBC
HCT: 35.9 % — ABNORMAL LOW (ref 39.0–52.0)
Hemoglobin: 11.6 g/dL — ABNORMAL LOW (ref 13.0–17.0)
MCH: 26 pg (ref 26.0–34.0)
MCHC: 32.3 g/dL (ref 30.0–36.0)
MCV: 80.5 fL (ref 80.0–100.0)
Platelets: 167 10*3/uL (ref 150–400)
RBC: 4.46 MIL/uL (ref 4.22–5.81)
RDW: 14.6 % (ref 11.5–15.5)
WBC: 11.7 10*3/uL — ABNORMAL HIGH (ref 4.0–10.5)
nRBC: 0 % (ref 0.0–0.2)

## 2022-11-21 LAB — GLUCOSE, CAPILLARY
Glucose-Capillary: 137 mg/dL — ABNORMAL HIGH (ref 70–99)
Glucose-Capillary: 238 mg/dL — ABNORMAL HIGH (ref 70–99)
Glucose-Capillary: 207 mg/dL — ABNORMAL HIGH (ref 70–99)
Glucose-Capillary: 232 mg/dL — ABNORMAL HIGH (ref 70–99)

## 2022-11-21 LAB — BASIC METABOLIC PANEL
Anion gap: 9 (ref 5–15)
BUN: 23 mg/dL (ref 8–23)
CO2: 23 mmol/L (ref 22–32)
Calcium: 8.3 mg/dL — ABNORMAL LOW (ref 8.9–10.3)
Chloride: 107 mmol/L (ref 98–111)
Creatinine, Ser: 0.94 mg/dL (ref 0.61–1.24)
GFR, Estimated: >60 mL/min (ref >60)
Glucose, Bld: 181 mg/dL — ABNORMAL HIGH (ref 70–99)
Potassium: 4 mmol/L (ref 3.5–5.1)
Sodium: 139 mmol/L (ref 135–145)

## 2022-11-21 LAB — MAGNESIUM: Magnesium: 2.1 mg/dL (ref 1.7–2.4)

## 2022-11-21 MED ORDER — VITAMIN D (ERGOCALCIFEROL) 1.25 MG (50000 UNIT) PO CAPS
50000.0000 [IU] | ORAL_CAPSULE | ORAL | Status: DC
  Administered 2022-11-21: 50000 [IU] via ORAL
  Filled 2022-11-21: qty 1

## 2022-11-21 MED ORDER — INSULIN ASPART 100 UNIT/ML IJ SOLN
3.0000 [IU] | Freq: Three times a day (TID) | INTRAMUSCULAR | Status: DC
  Administered 2022-11-21 – 2022-11-24 (×10): 3 [IU] via SUBCUTANEOUS

## 2022-11-21 NOTE — Plan of Care (Signed)
  Problem: Coping: Goal: Ability to adjust to condition or change in health will improve Outcome: Progressing   Problem: Nutritional: Goal: Maintenance of adequate nutrition will improve Outcome: Progressing   Problem: Metabolic: Goal: Ability to maintain appropriate glucose levels will improve Outcome: Progressing   Problem: Skin Integrity: Goal: Risk for impaired skin integrity will decrease Outcome: Progressing   Problem: Education: Goal: Knowledge of General Education information will improve Description: Including pain rating scale, medication(s)/side effects and non-pharmacologic comfort measures Outcome: Progressing   Problem: Nutrition: Goal: Adequate nutrition will be maintained Outcome: Progressing   Problem: Coping: Goal: Level of anxiety will decrease Outcome: Progressing   Problem: Elimination: Goal: Will not experience complications related to bowel motility Outcome: Progressing Goal: Will not experience complications related to urinary retention Outcome: Progressing   Problem: Pain Managment: Goal: General experience of comfort will improve Outcome: Progressing   Problem: Safety: Goal: Ability to remain free from injury will improve Outcome: Progressing

## 2022-11-21 NOTE — Progress Notes (Signed)
1030 - bladder scan showing 426 mL of urine in patient's bladder.  Patient denies any urge to void or any feelings of bladder fullness.  Patient states at baseline he has difficulty voiding.  Patient educated on notifying RN if patient feels need to void but is unable to.  RN to continue to monitor patient for void and bladder volume/retention.  RN to reassess bladder volume at approximately 1200.

## 2022-11-21 NOTE — Progress Notes (Signed)
PROGRESS NOTE    Michael Rose  WGN:[id number] DOB: 20-Nov-1950 DOA: 11/19/2022 PCP: [name], MD    Chief Complaint  Patient presents with   Fall    Brief Narrative: Patient 72 year old gentleman history of CAD status post stenting in 2008, bilateral knee OA status post bilateral TKR with chronic ambulatory dysfunction, hypertension, type 2 diabetes, hyperlipidemia, depression/anxiety, GERD presented with a fall and bilateral knee pain.  Imaging done in the ED concerning for a right knee distal femoral shaft emanated fracture.  Orthopedics consulted.  Patient subsequently underwent ORIF of right periprosthetic distal femur fracture on 11/20/2022.   Assessment & Plan:   Principal Problem:   Femoral fracture (HCC) Active Problems:   Mixed hyperlipidemia   MORBID OBESITY   Essential hypertension, benign   CORONARY ATHEROSCLEROSIS NATIVE CORONARY ARTERY   Closed fracture of right distal femur (HCC)   Type 2 diabetes mellitus without complication, without long-term current use of insulin (HCC)   Benign prostatic hyperplasia   Anxiety and depression   Leukocytosis   Vitamin D deficiency  #1 right periprostatic distal femur fracture -Secondary to mechanical fall. -Patient seen in consultation by orthopedics and subsequently underwent ORIF of right periprosthetic distal femur fracture,11/20/2022. -PT/OT. -Wife concerned about how patient is going to be able to maneuver steps in the apartment complex on discharge. -Pain management, DVT prophylaxis per orthopedics. -Appreciate orthopedics input and recommendations.  2.  Hypertension -Patient on admission noted to have elevated blood pressure felt in part secondary to pain from acute fracture. -Blood pressure has improved postoperatively.   -Continue home regimen Imdur, Toprol-XL.   3.  Leukocytosis -Likely reactive leukocytosis secondary to problem #1. -Leukocytosis fluctuating. -No signs or symptoms of  infection. -Follow.  4.  History of CAD -Asymptomatic. -2D echo obtained with a EF of 55 to 60%, NWMA, mild LVH, grade 1 diastolic dysfunction. -Continue home regimen metoprolol, statin, Imdur, aspirin.   5.  Depression/anxiety -Continue trazodone, Remeron.  6.  Diabetes mellitus type 2 -Hemoglobin A1c 6.8 (11/19/2022) -CBG 137 this morning. -Continue to hold Amaryl and Farxiga. -Will start meal coverage NovoLog 3 units 3 times daily with meals. -SSI.  7.  BPH -Patient noted on day of admission to have some urinary retention requiring in and out catheter.   -Patient with good urine output postoperatively.   -Continue Flomax.  8.  Vitamin D deficiency -Vitamin D 25-hydroxy at 4.94. -Start vitamin D 50,000 units q. weekly. -Outpatient follow-up.   DVT prophylaxis: Postop DVT prophylaxis per orthopedics Lovenox to be started 11/21/2022 Code Status: Full Family Communication: Updated patient, wife at bedside. Disposition: TBD  Status is: Inpatient Remains inpatient appropriate because: Severity of illness   Consultants:  Orthopedics: Dr. [name] 11/20/2022  Procedures:  Chest x-ray 11/19/2022 Plain films of the right knee 11/19/2022 Plain films of the left knee 11/19/2022 Plain films of the right femur 11/20/2022 2D echo 11/20/2022 ORIF of right periprosthetic distal femur fracture: Per Dr. [name] orthopedics 11/20/2022  Antimicrobials:  Anti-infectives (From admission, onward)    Start     Dose/Rate Route Frequency Ordered Stop   11/20/22 2000  ceFAZolin (ANCEF) IVPB 2g/100 mL premix        2 g 200 mL/hr over 30 Minutes Intravenous Every 8 hours 11/20/22 1520 11/21/22 1959   11/20/22 1339  vancomycin (VANCOCIN) powder  Status:  Discontinued          As needed 11/20/22 1339 11/20/22 1407   11/20/22 1200  ceFAZolin (ANCEF) IVPB 2g/100 mL premix  2 g 200 mL/hr over 30 Minutes Intravenous On call to O.R. 11/20/22 1100 11/20/22 1305   11/20/22 1102  ceFAZolin (ANCEF)  2-4 GM/100ML-% IVPB       Note to Pharmacy: [facility], [place]: cabinet override      11/20/22 1102 11/20/22 2046         Subjective: Sitting up in bed.  More alert.  Denies any chest pain or shortness of breath.  No abdominal pain.  Still with some right lower extremity pain but improved since admission.  Patient states had good urine output over the past 24 hours postoperatively.  Objective: Vitals:   11/20/22 2013 11/21/22 0109 11/21/22 0610 11/21/22 0932  BP: 126/70 111/62 (!) 159/84 (!) 147/85  Pulse: (!) 108 95 90 81  Resp: 17 20 20 18   Temp: 99 F (37.2 C) 99.3 F (37.4 C) 99.1 F (37.3 C) 97.7 F (36.5 C)  TempSrc: Oral Oral Oral   SpO2: 96% 93% 92% 95%  Weight:      Height:        Intake/Output Summary (Last 24 hours) at 11/21/2022 1211 Last data filed at 11/21/2022 1200 Gross per 24 hour  Intake 2829.44 ml  Output 1725 ml  Net 1104.44 ml    Filed Weights   11/19/22 1620 11/20/22 1120  Weight: 103.9 kg 103.8 kg    Examination:  General exam: NAD. Respiratory system: CTAB.  No wheezes, no crackles, no rhonchi.  Fair air movement.  Speaking in full sentences.  Cardiovascular system: RRR no murmurs rubs or gallops.  No JVD.  No lower extremity edema.  Gastrointestinal system: Abdomen is soft, nontender, nondistended, positive bowel sounds.  No rebound.  No guarding.   Central nervous system: Alert and oriented. No focal neurological deficits. Extremities: Right lower extremity in postop bandage.  Skin: No rashes, lesions or ulcers Psychiatry: Judgement and insight appear normal. Mood & affect appropriate.     Data Reviewed: I have personally reviewed following labs and imaging studies  CBC: Recent Labs  Lab 11/19/22 1535 11/20/22 0625 11/21/22 0441  WBC 12.1* 7.0 11.7*  NEUTROABS 9.6*  --   --   HGB 14.5 12.6* 11.6*  HCT 44.7 39.3 35.9*  MCV 79.8* 80.0 80.5  PLT 184 175 167     Basic Metabolic Panel: Recent Labs  Lab 11/19/22 1535  11/21/22 0441  NA 140 139  K 3.6 4.0  CL 104 107  CO2 23 23  GLUCOSE 113* 181*  BUN 19 23  CREATININE 0.86 0.94  CALCIUM 8.7* 8.3*  MG  --  2.1     GFR: Estimated Creatinine Clearance: 87.1 mL/min (by C-G formula based on SCr of 0.94 mg/dL).  Liver Function Tests: No results for input(s): "AST", "ALT", "ALKPHOS", "BILITOT", "PROT", "ALBUMIN" in the last 168 hours.  CBG: Recent Labs  Lab 11/20/22 1120 11/20/22 1413 11/20/22 1617 11/20/22 2233 11/21/22 0929  GLUCAP 123* 127* 222* 293* 137*      No results found for this or any previous visit (from the past 240 hour(s)).       Radiology Studies: DG FEMUR, MIN 2 VIEWS RIGHT  Result Date: 11/20/2022 CLINICAL DATA:  Postop fracture EXAM: RIGHT FEMUR 2 VIEWS COMPARISON:  11/19/2022 FINDINGS: Vascular calcifications. Previous knee replacement. Interval surgical plate and screw fixation of the mid to distal femur across comminuted distal femoral fracture with anatomic alignment. Gas in the soft tissues consistent with recent surgery IMPRESSION: Interval surgical fixation of distal femoral fracture with expected postsurgical change Electronically  Signed   By: Jasmine Pang M.D.   On: 11/20/2022 15:29   DG FEMUR, MIN 2 VIEWS RIGHT  Result Date: 11/20/2022 CLINICAL DATA:  Fluoroscopic assistance for internal fixation of fracture of distal right femur. EXAM: RIGHT FEMUR 2 VIEWS COMPARISON:  11/20/2022 FINDINGS: Fluoroscopic images show reduction and internal fixation of comminuted fracture of distal shaft of right femur. There is previous right knee arthroplasty. A metallic plate and multiple screws are used for internal fixation. Fluoroscopic time 67 seconds. Radiation dose 5.48 mGy. IMPRESSION: Fluoroscopic assistance was provided for reduction and internal fixation of comminuted fracture of distal shaft of right femur. Electronically Signed   By: Ernie Avena M.D.   On: 11/20/2022 14:22   DG C-Arm 1-60 Min-No  Report  Result Date: 11/20/2022 Fluoroscopy was utilized by the requesting physician.  No radiographic interpretation.   ECHOCARDIOGRAM COMPLETE  Result Date: 11/20/2022    ECHOCARDIOGRAM REPORT   Patient Name:   NICKO DAHER Sylvan Date of Exam: 11/20/2022 Medical Rec #:  [id number]      Height:       71.0 in Accession #:    [id number]     Weight:       229.0 lb Date of Birth:  1950/10/08       BSA:          2.233 m Patient Age:    72 years       BP:           98/62 mmHg Patient Gender: M              HR:           90 bpm. Exam Location:  Inpatient Procedure: 2D Echo, Cardiac Doppler and Color Doppler Indications:    Preoperative evaluation  History:        Patient has no prior history of Echocardiogram examinations.                 Risk Factors:Hypertension and Dyslipidemia.  Sonographer:    Lucendia Herrlich Referring Phys: [id number] PING T ZHANG IMPRESSIONS  1. Left ventricular ejection fraction, by estimation, is 55 to 60%. The left ventricle has normal function. The left ventricle has no regional wall motion abnormalities. There is mild left ventricular hypertrophy. Left ventricular diastolic parameters are consistent with Grade I diastolic dysfunction (impaired relaxation).  2. Right ventricular systolic function is normal. The right ventricular size is normal. Tricuspid regurgitation signal is inadequate for assessing PA pressure.  3. The mitral valve is normal in structure. Trivial mitral valve regurgitation. No evidence of mitral stenosis.  4. The aortic valve is tricuspid. There is mild calcification of the aortic valve. There is mild thickening of the aortic valve. Aortic valve regurgitation is not visualized. No aortic stenosis is present.  5. The inferior vena cava is normal in size with greater than 50% respiratory variability, suggesting right atrial pressure of 3 mmHg. FINDINGS  Left Ventricle: Left ventricular ejection fraction, by estimation, is 55 to 60%. The left ventricle has normal function. The  left ventricle has no regional wall motion abnormalities. The left ventricular internal cavity size was normal in size. There is  mild left ventricular hypertrophy. Left ventricular diastolic parameters are consistent with Grade I diastolic dysfunction (impaired relaxation). Right Ventricle: The right ventricular size is normal. No increase in right ventricular wall thickness. Right ventricular systolic function is normal. Tricuspid regurgitation signal is inadequate for assessing PA pressure. Left Atrium: Left atrial size was normal in size. Right  Atrium: Right atrial size was normal in size. Pericardium: There is no evidence of pericardial effusion. Mitral Valve: The mitral valve is normal in structure. Trivial mitral valve regurgitation. No evidence of mitral valve stenosis. Tricuspid Valve: The tricuspid valve is normal in structure. Tricuspid valve regurgitation is trivial. No evidence of tricuspid stenosis. Aortic Valve: The aortic valve is tricuspid. There is mild calcification of the aortic valve. There is mild thickening of the aortic valve. Aortic valve regurgitation is not visualized. No aortic stenosis is present. Aortic valve peak gradient measures 10.1 mmHg. Pulmonic Valve: The pulmonic valve was normal in structure. Pulmonic valve regurgitation is not visualized. No evidence of pulmonic stenosis. Aorta: The aortic root is normal in size and structure. Venous: The inferior vena cava is normal in size with greater than 50% respiratory variability, suggesting right atrial pressure of 3 mmHg. IAS/Shunts: No atrial level shunt detected by color flow Doppler.  LEFT VENTRICLE PLAX 2D LVIDd:         4.60 cm   Diastology LVIDs:         3.40 cm   LV e' medial:    7.30 cm/s LV PW:         1.30 cm   LV E/e' medial:  6.7 LV IVS:        1.10 cm   LV e' lateral:   6.22 cm/s LVOT diam:     2.20 cm   LV E/e' lateral: 7.9 LV SV:         49 LV SV Index:   22 LVOT Area:     3.80 cm  RIGHT VENTRICLE             IVC RV S  prime:     14.90 cm/s  IVC diam: 1.90 cm TAPSE (M-mode): 1.8 cm LEFT ATRIUM             Index LA diam:        3.60 cm 1.61 cm/m LA Vol (A2C):   43.0 ml 19.25 ml/m LA Vol (A4C):   39.7 ml 17.78 ml/m LA Biplane Vol: 44.7 ml 20.01 ml/m  AORTIC VALVE AV Area (Vmax): 2.05 cm AV Vmax:        159.00 cm/s AV Peak Grad:   10.1 mmHg LVOT Vmax:      85.70 cm/s LVOT Vmean:     53.900 cm/s LVOT VTI:       0.130 m  AORTA Ao Root diam: 3.60 cm Ao Asc diam:  3.10 cm MITRAL VALVE MV Area (PHT): 4.06 cm    SHUNTS MV Decel Time: 187 msec    Systemic VTI:  0.13 m MV E velocity: 49.00 cm/s  Systemic Diam: 2.20 cm MV A velocity: 80.80 cm/s MV E/A ratio:  0.61 Weston Brass MD Electronically signed by Weston Brass MD Signature Date/Time: 11/20/2022/1:17:25 PM    Final    DG FEMUR PORT, MIN 2 VIEWS RIGHT  Result Date: 11/20/2022 CLINICAL DATA:  Preop evaluation EXAM: RIGHT FEMUR PORTABLE 2 VIEW COMPARISON:  11/19/2022 FINDINGS: Comminuted distal femoral fracture is again seen on the right just above the knee prosthesis. Approximately 1/2 bone with displacement of the distal fracture fragment is noted with respect to the proximal femoral shaft. No other fracture is seen. IMPRESSION: Comminuted distal right femoral fracture. Electronically Signed   By: Alcide Clever M.D.   On: 11/20/2022 09:50   Chest Portable 1 View  Result Date: 11/19/2022 CLINICAL DATA:  Preop for femoral fracture. EXAM: PORTABLE CHEST 1 VIEW  COMPARISON:  Oct 13, 2012. FINDINGS: Stable cardiomediastinal silhouette. Left lung is clear. Minimal right basilar subsegmental atelectasis is noted with small right pleural effusion. Bony thorax is unremarkable. IMPRESSION: Minimal right basilar subsegmental atelectasis is noted with small right pleural effusion. Electronically Signed   By: Lupita Raider M.D.   On: 11/19/2022 18:53   DG Knee 2 Views Right  Result Date: 11/19/2022 CLINICAL DATA:  Trauma, fall, pain EXAM: RIGHT KNEE - 1-2 VIEW COMPARISON:  None  Available. FINDINGS: There is previous right knee arthroplasty. There is severely comminuted fracture in the distal shaft of femur. There is 10 mm offset in alignment of fracture fragments along the lateral aspect. There is overriding of fracture fragments. Arterial calcifications are seen in soft tissues. IMPRESSION: Previous right knee arthroplasty. Recent comminuted displaced fracture is seen in the distal shaft of right femur. Arteriosclerosis. Electronically Signed   By: Ernie Avena M.D.   On: 11/19/2022 16:47        Scheduled Meds:  aspirin  81 mg Oral Daily   atorvastatin  10 mg Oral QHS   docusate sodium  100 mg Oral BID   enoxaparin (LOVENOX) injection  40 mg Subcutaneous Q24H   feeding supplement  237 mL Oral BID BM   fluvoxaMINE  100 mg Oral Q breakfast   insulin aspart  0-15 Units Subcutaneous TID WC   insulin aspart  0-5 Units Subcutaneous QHS   isosorbide mononitrate  30 mg Oral Daily   loratadine  10 mg Oral Daily   metoprolol succinate  25 mg Oral QHS   metoprolol succinate  50 mg Oral Daily   mirtazapine  30 mg Oral QHS   multivitamin with minerals  1 tablet Oral Daily   pantoprazole  40 mg Oral Daily   polyethylene glycol  17 g Oral Daily   senna-docusate  1 tablet Oral BID   tamsulosin  0.4 mg Oral QPC supper   traZODone  50 mg Oral QHS   Vitamin D (Ergocalciferol)  50,000 Units Oral Q7 days   Continuous Infusions:  sodium chloride 100 mL/hr at 11/20/22 2315    ceFAZolin (ANCEF) IV 2 g (11/21/22 0435)   methocarbamol (ROBAXIN) IV       LOS: 2 days    Time spent: 35 minutes    Ramiro Harvest, MD [facility]   To contact the attending provider between 7A-7P or the covering provider during after hours 7P-7A, please log into the web site [email or web address] and access using universal [place] password for that web site. If you do not have the password, please call the hospital operator.  11/21/2022, 12:11 PM

## 2022-11-21 NOTE — Progress Notes (Signed)
ORTHOPAEDIC PROGRESS NOTE  s/p Procedure(s): OPEN REDUCTION INTERNAL FIXATION (ORIF) DISTAL FEMUR FRACTURE on 11/20/22 with Dr. [name]   SUBJECTIVE: Reports moderate pain about operative site.   No chest pain. No SOB. No nausea/vomiting. No other complaints.  OBJECTIVE: PE: General: sitting up in hospital bed, NAD RLE: dressings CDI, intact EHL/TA/GSC, endorses distal sensation, leg lengths equal, warm well perfused foot   Vitals:   11/21/22 0610 11/21/22 0932  BP: (!) 159/84 (!) 147/85  Pulse: 90 81  Resp: 20 18  Temp: 99.1 F (37.3 C) 97.7 F (36.5 C)  SpO2: 92% 95%     ASSESSMENT: Michael Rose is a 72 y.o. male POD#1  PLAN: Weightbearing: WBAT RLE Insicional and dressing care: Reinforce dressings as needed Orthopedic device(s): None Showering: Hold for now VTE prophylaxis: Lovenox while inpatient Pain control: PRN pain medications, minimize narcotics as able Follow - up plan: 1-2 weeks in office with Dr. [name] Dispo: TBD. PT/OT evals today. Anticipate SNF.  Contact information: After hours and holidays please check [email or web address] for group call information for [facility]  Alfonse Alpers, PA-C 11/21/22

## 2022-11-21 NOTE — Evaluation (Signed)
Physical Therapy Evaluation Patient Details Name: Michael Rose MRN: [id number] DOB: Jul 23, 1950 Today's Date: 11/21/2022  History of Present Illness  Pt is a 72 y.o. male presenting 11/19/2022 with 2 falls and bil knee pain. X-ray showed R knee distal femoral shaft emanated fracture; L knee with no fracture or dislocation. Now s/p ORIF R periprosthetic distal femur fracture. PMH significant for CAD s/p stenting in 2008, bilateral knee OA status post bilateral TKR with chronic ambulation dysfunction, HTN, IIDM, HLD, anxiety/depression, GERD.   Clinical Impression  Pt admitted with above diagnosis. PTA pt lived at home with his wife, mod I mobility with cane vs RW. Pt currently with functional limitations due to the deficits listed below (see PT Problem List). On eval, pt required mod assist supine to sit, and min assist sit to supine. Unable to clear bottom from elevated bed with +1 max assist for standing attempt with RW due to RLE pain. Pt will benefit from acute skilled PT to increase their independence and safety with mobility to allow discharge.  Pt would benefit from continued therapy in inpatient setting, < 3 hours/day.        Recommendations for follow up therapy are one component of a multi-disciplinary discharge planning process, led by the attending physician.  Recommendations may be updated based on patient status, additional functional criteria and insurance authorization.  Follow Up Recommendations Can patient physically be transported by private vehicle: No     Assistance Recommended at Discharge Frequent or constant Supervision/Assistance  Patient can return home with the following  A lot of help with bathing/dressing/bathroom;Two people to help with walking and/or transfers;Assist for transportation;Help with stairs or ramp for entrance    Equipment Recommendations Other (comment) (TBD)  Recommendations for Other Services       Functional Status Assessment Patient has had a  recent decline in their functional status and demonstrates the ability to make significant improvements in function in a reasonable and predictable amount of time.     Precautions / Restrictions Precautions Precautions: Fall Restrictions Weight Bearing Restrictions: Yes RLE Weight Bearing: Weight bearing as tolerated      Mobility  Bed Mobility Overal bed mobility: Needs Assistance Bed Mobility: Supine to Sit, Sit to Supine     Supine to sit: Mod assist, HOB elevated Sit to supine: Min assist   General bed mobility comments: assist with trunk and BLE OOB, assist with RLE back to bed    Transfers Overall transfer level: Needs assistance Equipment used: Rolling walker (2 wheels) Transfers: Sit to/from Stand Sit to Stand: Max assist, From elevated surface           General transfer comment: unable to clear bottom from bed with +1 max assist (due to pain RLE)    Ambulation/Gait               General Gait Details: unable due to pain  Stairs            Wheelchair Mobility    Modified Rankin (Stroke Patients Only)       Balance Overall balance assessment: Needs assistance Sitting-balance support: No upper extremity supported, Feet supported Sitting balance-Leahy Scale: Good                                       Pertinent Vitals/Pain Pain Assessment Pain Assessment: Faces Faces Pain Scale: Hurts whole lot Pain Location: R knee with standing attempt  Pain Descriptors / Indicators: Discomfort, Grimacing, Guarding Pain Intervention(s): Monitored during session, Limited activity within patient's tolerance    Home Living Family/patient expects to be discharged to:: Private residence Living Arrangements: Spouse/significant other Available Help at Discharge: Family Type of Home: Apartment Home Access: Stairs to enter   Secretary/administrator of Steps: 3   Home Layout: One level Home Equipment: Cane - single Librarian, academic (2  wheels)      Prior Function Prior Level of Function : Independent/Modified Independent;Driving             Mobility Comments: RW vs cane for ambulation       Hand Dominance        Extremity/Trunk Assessment   Upper Extremity Assessment Upper Extremity Assessment: Defer to OT evaluation    Lower Extremity Assessment Lower Extremity Assessment: RLE deficits/detail RLE Deficits / Details: s/p ORIF distal femur fx, surgical dressing in place RLE: Unable to fully assess due to pain    Cervical / Trunk Assessment Cervical / Trunk Assessment: Kyphotic  Communication   Communication: HOH  Cognition Arousal/Alertness: Awake/alert Behavior During Therapy: WFL for tasks assessed/performed Overall Cognitive Status: Within Functional Limits for tasks assessed                                          General Comments      Exercises     Assessment/Plan    PT Assessment Patient needs continued PT services  PT Problem List Decreased strength;Decreased balance;Pain;Decreased mobility;Decreased activity tolerance       PT Treatment Interventions Functional mobility training;Balance training;Patient/family education;Gait training;Therapeutic activities;Therapeutic exercise    PT Goals (Current goals can be found in the Care Plan section)  Acute Rehab PT Goals Patient Stated Goal: home PT Goal Formulation: With patient Time For Goal Achievement: 12/05/22 Potential to Achieve Goals: Good    Frequency Min 3X/week     Co-evaluation               AM-PAC PT "6 Clicks" Mobility  Outcome Measure Help needed turning from your back to your side while in a flat bed without using bedrails?: A Little Help needed moving from lying on your back to sitting on the side of a flat bed without using bedrails?: A Little Help needed moving to and from a bed to a chair (including a wheelchair)?: A Little Help needed standing up from a chair using your arms (e.g.,  wheelchair or bedside chair)?: Total Help needed to walk in hospital room?: Total Help needed climbing 3-5 steps with a railing? : Total 6 Click Score: 12    End of Session Equipment Utilized During Treatment: Gait belt Activity Tolerance: Patient limited by pain Patient left: in bed;with call bell/phone within reach;with bed alarm set Nurse Communication: Mobility status PT Visit Diagnosis: Pain;Difficulty in walking, not elsewhere classified (R26.2) Pain - Right/Left: Right Pain - part of body: Knee    Time: 1610-9604 PT Time Calculation (min) (ACUTE ONLY): 21 min   Charges:   PT Evaluation $PT Eval Moderate Complexity: 1 Mod          Ferd Glassing., PT  Office # [phone]   Ilda Foil 11/21/2022, 12:25 PM

## 2022-11-22 ENCOUNTER — Encounter (HOSPITAL_COMMUNITY): Payer: Self-pay | Admitting: Student

## 2022-11-22 DIAGNOSIS — S72491A Other fracture of lower end of right femur, initial encounter for closed fracture: Secondary | ICD-10-CM | POA: Diagnosis not present

## 2022-11-22 DIAGNOSIS — I2510 Atherosclerotic heart disease of native coronary artery without angina pectoris: Secondary | ICD-10-CM | POA: Diagnosis not present

## 2022-11-22 DIAGNOSIS — I10 Essential (primary) hypertension: Secondary | ICD-10-CM | POA: Diagnosis not present

## 2022-11-22 DIAGNOSIS — E782 Mixed hyperlipidemia: Secondary | ICD-10-CM | POA: Diagnosis not present

## 2022-11-22 LAB — CBC
HCT: 32.1 % — ABNORMAL LOW (ref 39.0–52.0)
Hemoglobin: 10.7 g/dL — ABNORMAL LOW (ref 13.0–17.0)
MCH: 26.5 pg (ref 26.0–34.0)
MCHC: 33.3 g/dL (ref 30.0–36.0)
MCV: 79.5 fL — ABNORMAL LOW (ref 80.0–100.0)
Platelets: 171 10*3/uL (ref 150–400)
RBC: 4.04 MIL/uL — ABNORMAL LOW (ref 4.22–5.81)
RDW: 14.8 % (ref 11.5–15.5)
WBC: 10.2 10*3/uL (ref 4.0–10.5)
nRBC: 0 % (ref 0.0–0.2)

## 2022-11-22 LAB — BASIC METABOLIC PANEL
Anion gap: 7 (ref 5–15)
BUN: 21 mg/dL (ref 8–23)
CO2: 26 mmol/L (ref 22–32)
Calcium: 8.3 mg/dL — ABNORMAL LOW (ref 8.9–10.3)
Chloride: 107 mmol/L (ref 98–111)
Creatinine, Ser: 0.8 mg/dL (ref 0.61–1.24)
GFR, Estimated: >60 mL/min (ref >60)
Glucose, Bld: 178 mg/dL — ABNORMAL HIGH (ref 70–99)
Potassium: 3.7 mmol/L (ref 3.5–5.1)
Sodium: 140 mmol/L (ref 135–145)

## 2022-11-22 LAB — GLUCOSE, CAPILLARY
Glucose-Capillary: 137 mg/dL — ABNORMAL HIGH (ref 70–99)
Glucose-Capillary: 209 mg/dL — ABNORMAL HIGH (ref 70–99)
Glucose-Capillary: 275 mg/dL — ABNORMAL HIGH (ref 70–99)
Glucose-Capillary: 176 mg/dL — ABNORMAL HIGH (ref 70–99)

## 2022-11-22 MED ORDER — ACETAMINOPHEN 500 MG PO TABS
1000.0000 mg | ORAL_TABLET | Freq: Three times a day (TID) | ORAL | Status: AC
  Administered 2022-11-22 – 2022-11-23 (×3): 1000 mg via ORAL
  Filled 2022-11-22 (×3): qty 2

## 2022-11-22 MED ORDER — POLYETHYLENE GLYCOL 3350 17 G PO PACK
17.0000 g | PACK | Freq: Two times a day (BID) | ORAL | Status: DC
  Administered 2022-11-22 – 2022-11-24 (×3): 17 g via ORAL
  Filled 2022-11-22 (×4): qty 1

## 2022-11-22 MED ORDER — SORBITOL 70 % SOLN
30.0000 mL | Freq: Once | Status: AC
  Administered 2022-11-22: 30 mL via ORAL
  Filled 2022-11-22: qty 30

## 2022-11-22 NOTE — TOC Initial Note (Signed)
Transition of Care [facility]) - Initial/Assessment Note    Patient Details  Name: Michael Rose MRN: [id number] Date of Birth: 04/03/1951  Transition of Care [facility]) CM/SW Contact:    Carmina Miller, LCSWA Phone Number: 11/22/2022, 4:28 PM  Clinical Narrative:                  CSW tried to speak with pt about SNF recommendation, he states he is about to use the bathroom, requested CSW to follow up with him later.        Patient Goals and CMS Choice            Expected Discharge Plan and Services                                              Prior Living Arrangements/Services                       Activities of Daily Living Home Assistive Devices/Equipment: Cane (specify quad or straight), Walker (specify type), Shower chair with back (single cane, front wheel walker) ADL Screening (condition at time of admission) Patient's cognitive ability adequate to safely complete daily activities?: Yes Is the patient deaf or have difficulty hearing?: Yes Does the patient have difficulty seeing, even when wearing glasses/contacts?: No Does the patient have difficulty concentrating, remembering, or making decisions?: No Patient able to express need for assistance with ADLs?: Yes Does the patient have difficulty dressing or bathing?: No Independently performs ADLs?: Yes (appropriate for developmental age) Does the patient have difficulty walking or climbing stairs?: No Weakness of Legs: None Weakness of Arms/Hands: None  Permission Sought/Granted                  Emotional Assessment              Admission diagnosis:  Femoral fracture (HCC) [S72.90XA] Closed fracture of distal end of right femur, unspecified fracture morphology, initial encounter (HCC) [S72.401A] Patient Active Problem List   Diagnosis Date Noted   Vitamin D deficiency 11/21/2022   Type 2 diabetes mellitus without complication, without long-term current use of insulin (HCC) 11/20/2022    Benign prostatic hyperplasia 11/20/2022   Anxiety and depression 11/20/2022   Leukocytosis 11/20/2022   Closed fracture of right distal femur (HCC) 11/19/2022   Femoral fracture (HCC) 11/19/2022   Mixed hyperlipidemia 07/08/2010   MORBID OBESITY 07/08/2010   Essential hypertension, benign 07/08/2010   CORONARY ATHEROSCLEROSIS NATIVE CORONARY ARTERY 07/08/2010   PCP:  [name], MD Pharmacy:   [facility] - [place], [place] - [address] [place] [address] Phone: [phone] Fax: [phone]  [facility] - [place], [place] - [address] [address] Phone: [phone] Fax: [phone]     Social Determinants of Health (SDOH) Social History: SDOH Screenings   Food Insecurity: Food Insecurity Present (11/20/2022)  Housing: Low Risk  (11/20/2022)  Transportation Needs: No Transportation Needs (11/20/2022)  Utilities: Not At Risk (11/20/2022)  Financial Resource Strain: High Risk (01/19/2022)  Tobacco Use: Low Risk  (11/22/2022)   SDOH Interventions:     Readmission Risk Interventions     No data to display

## 2022-11-22 NOTE — Progress Notes (Addendum)
PROGRESS NOTE    Michael Rose  BJY:[id number] DOB: May 07, 1951 DOA: 11/19/2022 PCP: [name], MD    Chief Complaint  Patient presents with   Fall    Brief Narrative: Patient 72 year old gentleman history of CAD status post stenting in 2008, bilateral knee OA status post bilateral TKR with chronic ambulatory dysfunction, hypertension, type 2 diabetes, hyperlipidemia, depression/anxiety, GERD presented with a fall and bilateral knee pain.  Imaging done in the ED concerning for a right knee distal femoral shaft emanated fracture.  Orthopedics consulted.  Patient subsequently underwent ORIF of right periprosthetic distal femur fracture on 11/20/2022.   Assessment & Plan:   Principal Problem:   Femoral fracture (HCC) Active Problems:   Mixed hyperlipidemia   MORBID OBESITY   Essential hypertension, benign   CORONARY ATHEROSCLEROSIS NATIVE CORONARY ARTERY   Closed fracture of right distal femur (HCC)   Type 2 diabetes mellitus without complication, without long-term current use of insulin (HCC)   Benign prostatic hyperplasia   Anxiety and depression   Leukocytosis   Vitamin D deficiency  #1 right periprostatic distal femur fracture -Secondary to mechanical fall. -Patient seen in consultation by orthopedics and subsequently underwent ORIF of right periprosthetic distal femur fracture,11/20/2022. -PT/OT assessed patient and recommending SNF placement.. -Wife concerned about how patient is going to be able to maneuver steps in the apartment complex on discharge. -Pain management, DVT prophylaxis per orthopedics. -Appreciate orthopedics input and recommendations.  2.  Hypertension -Patient on admission noted to have elevated blood pressure felt in part secondary to pain from acute fracture. -Blood pressure improved postoperatively.   -Continue home regimen Imdur, Toprol-XL.   3.  Leukocytosis -Likely reactive leukocytosis secondary to problem #1. -Leukocytosis trending down.    -No signs or symptoms of infection.   4.  History of CAD -Asymptomatic. -2D echo obtained with a EF of 55 to 60%, NWMA, mild LVH, grade 1 diastolic dysfunction. -Continue statin, metoprolol, Imdur, aspirin.     5.  Depression/anxiety -Remeron, trazodone.   6.  Diabetes mellitus type 2 -Hemoglobin A1c 6.8 (11/19/2022) -CBG 137 this morning. -Continue to hold Amaryl, Farxiga.   -Continue meal coverage NovoLog 3 units 3 times daily.   -SSI.    7.  BPH -Patient noted on day of admission to have some urinary retention requiring in and out catheter.   -Patient with good urine output postoperatively.   -Flomax.   8.  Vitamin D deficiency -Vitamin D 25-hydroxy at 4.94. -Continue vitamin D 50,000 units q. weekly.   -Outpatient follow-up.  9.  Constipation -Patient with complaints of constipation. -Placed on MiraLAX twice daily, continue Senokot-S twice daily. -Sorbitol p.o. x 1.   DVT prophylaxis: Postop DVT prophylaxis per orthopedics Lovenox started 11/21/2022 Code Status: Full Family Communication: Updated patient, no family at bedside.  Disposition: Likely SNF when medically stable hopefully in the next 1 to 2 days.  Status is: Inpatient Remains inpatient appropriate because: Severity of illness   Consultants:  Orthopedics: Dr. [name] 11/20/2022  Procedures:  Chest x-ray 11/19/2022 Plain films of the right knee 11/19/2022 Plain films of the left knee 11/19/2022 Plain films of the right femur 11/20/2022 2D echo 11/20/2022 ORIF of right periprosthetic distal femur fracture: Per Dr. [name] orthopedics 11/20/2022  Antimicrobials:  Anti-infectives (From admission, onward)    Start     Dose/Rate Route Frequency Ordered Stop   11/20/22 2000  ceFAZolin (ANCEF) IVPB 2g/100 mL premix        2 g 200 mL/hr over 30 Minutes Intravenous  Every 8 hours 11/20/22 1520 11/21/22 1349   11/20/22 1339  vancomycin (VANCOCIN) powder  Status:  Discontinued          As needed 11/20/22 1339  11/20/22 1407   11/20/22 1200  ceFAZolin (ANCEF) IVPB 2g/100 mL premix        2 g 200 mL/hr over 30 Minutes Intravenous On call to O.R. 11/20/22 1100 11/20/22 1305   11/20/22 1102  ceFAZolin (ANCEF) 2-4 GM/100ML-% IVPB       Note to Pharmacy: Lurena Nida: cabinet override      11/20/22 1102 11/20/22 2046         Subjective: Sitting up on bedside commode.  States has not had a bowel movement in 3 days and complaining of constipation.  No chest pain.  No shortness of breath.  No abdominal pain.  Still with some right lower extremity pain.   Objective: Vitals:   11/21/22 1927 11/22/22 0454 11/22/22 0825 11/22/22 1436  BP: 120/78 (!) 151/87 (!) 142/66 115/69  Pulse: 91 81 79 89  Resp: 17 16 18 18   Temp: 98.4 F (36.9 C)  97.9 F (36.6 C) 98.4 F (36.9 C)  TempSrc: Oral  Oral Oral  SpO2: 95% 95% 95% 96%  Weight:      Height:        Intake/Output Summary (Last 24 hours) at 11/22/2022 1437 Last data filed at 11/22/2022 1300 Gross per 24 hour  Intake 480 ml  Output 4050 ml  Net -3570 ml    Filed Weights   11/19/22 1620 11/20/22 1120  Weight: 103.9 kg 103.8 kg    Examination:  General exam: NAD. Respiratory system: Lungs clear to auscultation bilaterally.  No wheezes, no crackles, no rhonchi.  Fair air movement.  Speaking in full sentences. Cardiovascular system: Regular rate and rhythm no murmurs rubs or gallops.  No JVD.  No lower extremity edema. Gastrointestinal system: Abdomen is soft, nontender, nondistended, positive bowel sounds.  No rebound.  No guarding.  Central nervous system: Alert and oriented. No focal neurological deficits. Extremities: Right lower extremity in postop bandage.  Skin: No rashes, lesions or ulcers Psychiatry: Judgement and insight appear normal. Mood & affect appropriate.     Data Reviewed: I have personally reviewed following labs and imaging studies  CBC: Recent Labs  Lab 11/19/22 1535 11/20/22 0625 11/21/22 0441 11/22/22 0232   WBC 12.1* 7.0 11.7* 10.2  NEUTROABS 9.6*  --   --   --   HGB 14.5 12.6* 11.6* 10.7*  HCT 44.7 39.3 35.9* 32.1*  MCV 79.8* 80.0 80.5 79.5*  PLT 184 175 167 171     Basic Metabolic Panel: Recent Labs  Lab 11/19/22 1535 11/21/22 0441 11/22/22 0232  NA 140 139 140  K 3.6 4.0 3.7  CL 104 107 107  CO2 23 23 26   GLUCOSE 113* 181* 178*  BUN 19 23 21   CREATININE 0.86 0.94 0.80  CALCIUM 8.7* 8.3* 8.3*  MG  --  2.1  --      GFR: Estimated Creatinine Clearance: 102.4 mL/min (by C-G formula based on SCr of 0.8 mg/dL).  Liver Function Tests: No results for input(s): "AST", "ALT", "ALKPHOS", "BILITOT", "PROT", "ALBUMIN" in the last 168 hours.  CBG: Recent Labs  Lab 11/21/22 1310 11/21/22 1716 11/21/22 2133 11/22/22 0840 11/22/22 1130  GLUCAP 238* 207* 232* 137* 209*      No results found for this or any previous visit (from the past 240 hour(s)).       Radiology  Studies: DG FEMUR, MIN 2 VIEWS RIGHT  Result Date: 11/20/2022 CLINICAL DATA:  Postop fracture EXAM: RIGHT FEMUR 2 VIEWS COMPARISON:  11/19/2022 FINDINGS: Vascular calcifications. Previous knee replacement. Interval surgical plate and screw fixation of the mid to distal femur across comminuted distal femoral fracture with anatomic alignment. Gas in the soft tissues consistent with recent surgery IMPRESSION: Interval surgical fixation of distal femoral fracture with expected postsurgical change Electronically Signed   By: Jasmine Pang M.D.   On: 11/20/2022 15:29        Scheduled Meds:  acetaminophen  1,000 mg Oral Q8H   aspirin  81 mg Oral Daily   atorvastatin  10 mg Oral QHS   docusate sodium  100 mg Oral BID   enoxaparin (LOVENOX) injection  40 mg Subcutaneous Q24H   feeding supplement  237 mL Oral BID BM   fluvoxaMINE  100 mg Oral Q breakfast   insulin aspart  0-15 Units Subcutaneous TID WC   insulin aspart  0-5 Units Subcutaneous QHS   insulin aspart  3 Units Subcutaneous TID WC   isosorbide  mononitrate  30 mg Oral Daily   loratadine  10 mg Oral Daily   metoprolol succinate  25 mg Oral QHS   metoprolol succinate  50 mg Oral Daily   mirtazapine  30 mg Oral QHS   multivitamin with minerals  1 tablet Oral Daily   pantoprazole  40 mg Oral Daily   polyethylene glycol  17 g Oral BID   senna-docusate  1 tablet Oral BID   tamsulosin  0.4 mg Oral QPC supper   traZODone  50 mg Oral QHS   Vitamin D (Ergocalciferol)  50,000 Units Oral Q7 days   Continuous Infusions:  sodium chloride 100 mL/hr at 11/20/22 2315   methocarbamol (ROBAXIN) IV       LOS: 3 days    Time spent: 35 minutes    Ramiro Harvest, MD [facility]   To contact the attending provider between 7A-7P or the covering provider during after hours 7P-7A, please log into the web site [email or web address] and access using universal [place] password for that web site. If you do not have the password, please call the hospital operator.  11/22/2022, 2:37 PM
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
## 2022-11-22 NOTE — Progress Notes (Signed)
ORTHOPAEDIC PROGRESS NOTE  s/p Procedure(s): OPEN REDUCTION INTERNAL FIXATION (ORIF) DISTAL FEMUR FRACTURE on 11/20/22 with Dr. [name]   SUBJECTIVE: Reports moderate pain about operative site. States his pain is interfering with his ability to participate with therapy.   No chest pain. No SOB. No nausea/vomiting. No other complaints.  OBJECTIVE: PE: General: sitting up in hospital bed, NAD RLE: dressings CDI, intact EHL/TA/GSC, endorses distal sensation, leg lengths equal, warm well perfused foot   Vitals:   11/21/22 1927 11/22/22 0454  BP: 120/78 (!) 151/87  Pulse: 91 81  Resp: 17 16  Temp: 98.4 F (36.9 C)   SpO2: 95% 95%     ASSESSMENT: JERIN FRANZEL is a 72 y.o. male POD#2  PLAN: Weightbearing: WBAT RLE Insicional and dressing care: Reinforce dressings as needed Orthopedic device(s): None Showering: Hold for now VTE prophylaxis: Lovenox while inpatient Pain control: PRN pain medications, minimize narcotics as able. Added scheduled Tylenol to his current regimen.  Follow - up plan: 1-2 weeks in office with Dr. [name] Dispo: TBD. PT currently recommending CIR.   Contact information: After hours and holidays please check [email or web address] for group call information for [facility]  Alfonse Alpers, PA-C 11/22/22

## 2022-11-22 NOTE — Plan of Care (Signed)
  Problem: Nutritional: Goal: Maintenance of adequate nutrition will improve Outcome: Progressing   Problem: Skin Integrity: Goal: Risk for impaired skin integrity will decrease Outcome: Progressing   Problem: Education: Goal: Knowledge of General Education information will improve Description: Including pain rating scale, medication(s)/side effects and non-pharmacologic comfort measures Outcome: Progressing   

## 2022-11-22 NOTE — Evaluation (Signed)
Occupational Therapy Evaluation Patient Details Name: Michael Rose MRN: [id number] DOB: [birth year] Today's Date: 11/22/2022   History of Present Illness Pt is a 72 y.o. male presenting 11/19/2022 with 2 falls and bil knee pain. X-ray showed R knee distal femoral shaft emanated fracture; L knee with no fracture or dislocation. Now s/p ORIF R periprosthetic distal femur fracture. PMH significant for CAD s/p stenting in 2008, bilateral knee OA status post bilateral TKR with chronic ambulation dysfunction, HTN, IIDM, HLD, anxiety/depression, GERD.   Clinical Impression   Patient admitted for the diagnosis above.  PTA he lives at home with his spouse, walked with an AD due to knee buckling, and was able to complete his own ADL, and participates with iADL.  He is limited by R leg pain, needing up to Mod A for basic transfers, and up to Max A for ADL completion from a seated position.  OT to continue efforts in the acute setting to address deficits, and Patient will benefit from continued inpatient follow up therapy, <3 hours/day.  Patient does not have the needed 24 hour up to Max A at home to transition directly there, but should progress nicely once his pain is managed with post acute rehab.       Recommendations for follow up therapy are one component of a multi-disciplinary discharge planning process, led by the attending physician.  Recommendations may be updated based on patient status, additional functional criteria and insurance authorization.   Assistance Recommended at Discharge Frequent or constant Supervision/Assistance  Patient can return home with the following Help with stairs or ramp for entrance;A lot of help with bathing/dressing/bathroom;A lot of help with walking and/or transfers;Assist for transportation;Assistance with cooking/housework    Functional Status Assessment  Patient has had a recent decline in their functional status and demonstrates the ability to make significant  improvements in function in a reasonable and predictable amount of time.  Equipment Recommendations  None recommended by OT    Recommendations for Other Services       Precautions / Restrictions Precautions Precautions: Fall Restrictions Weight Bearing Restrictions: No RLE Weight Bearing: Weight bearing as tolerated      Mobility Bed Mobility Overal bed mobility: Needs Assistance Bed Mobility: Supine to Sit     Supine to sit: Min assist, HOB elevated          Transfers Overall transfer level: Needs assistance Equipment used: Rolling walker (2 wheels) Transfers: Sit to/from Stand, Bed to chair/wheelchair/BSC Sit to Stand: Mod assist     Step pivot transfers: Mod assist            Balance Overall balance assessment: Needs assistance Sitting-balance support: No upper extremity supported, Feet supported Sitting balance-Leahy Scale: Good     Standing balance support: Reliant on assistive device for balance Standing balance-Leahy Scale: Poor                 High Level Balance Comments: R leg remains weak, difficulty with long arc quad           ADL either performed or assessed with clinical judgement   ADL Overall ADL's : Needs assistance/impaired Eating/Feeding: Independent;Sitting   Grooming: Wash/dry hands;Wash/dry face;Set up;Sitting   Upper Body Bathing: Set up;Sitting   Lower Body Bathing: Moderate assistance;Sitting/lateral leans   Upper Body Dressing : Set up;Sitting   Lower Body Dressing: Moderate assistance;Sitting/lateral leans   Toilet Transfer: Moderate assistance;Stand-pivot;BSC/3in1;Rolling walker (2 wheels)   Toileting- Clothing Manipulation and Hygiene: Maximal assistance;Sit to/from stand  Vision Baseline Vision/History: 1 Wears glasses Patient Visual Report: No change from baseline       Perception     Praxis      Pertinent Vitals/Pain Pain Assessment Pain Assessment: Faces Faces Pain Scale:  Hurts even more Pain Location: R knee with standing attempt Pain Descriptors / Indicators: Discomfort, Grimacing, Guarding     Hand Dominance Right   Extremity/Trunk Assessment Upper Extremity Assessment Upper Extremity Assessment: Overall WFL for tasks assessed   Lower Extremity Assessment Lower Extremity Assessment: Defer to PT evaluation   Cervical / Trunk Assessment Cervical / Trunk Assessment: Kyphotic   Communication Communication Communication: HOH   Cognition Arousal/Alertness: Awake/alert Behavior During Therapy: Impulsive Overall Cognitive Status: Within Functional Limits for tasks assessed                                                        Home Living Family/patient expects to be discharged to:: Private residence Living Arrangements: Spouse/significant other Available Help at Discharge: Family Type of Home: Apartment Home Access: Stairs to enter Secretary/administrator of Steps: 3   Home Layout: One level     Bathroom Shower/Tub: Tub/shower unit;Walk-in shower   Bathroom Toilet: Standard Bathroom Accessibility: Yes How Accessible: Accessible via walker Home Equipment: Cane - single point;Rolling Walker (2 wheels)          Prior Functioning/Environment Prior Level of Function : Independent/Modified Independent;Driving             Mobility Comments: RW vs cane for ambulation ADLs Comments: Able to complete ADL and helps with iADL        OT Problem List: Decreased strength;Decreased range of motion;Decreased activity tolerance;Impaired balance (sitting and/or standing);Decreased knowledge of use of DME or AE;Pain      OT Treatment/Interventions: Self-care/ADL training;Therapeutic activities;Patient/family education;Balance training;DME and/or AE instruction    OT Goals(Current goals can be found in the care plan section) Acute Rehab OT Goals Patient Stated Goal: Move better OT Goal Formulation: With patient Time  For Goal Achievement: 12/07/22 Potential to Achieve Goals: Good ADL Goals Pt Will Perform Grooming: standing;with min guard assist Pt Will Perform Lower Body Dressing: with min assist;sit to/from stand;with adaptive equipment Pt Will Transfer to Toilet: with min guard assist;ambulating;regular height toilet  OT Frequency: Min 2X/week    Co-evaluation              AM-PAC OT "6 Clicks" Daily Activity     Outcome Measure Help from another person eating meals?: None Help from another person taking care of personal grooming?: None Help from another person toileting, which includes using toliet, bedpan, or urinal?: A Lot Help from another person bathing (including washing, rinsing, drying)?: A Lot Help from another person to put on and taking off regular upper body clothing?: None Help from another person to put on and taking off regular lower body clothing?: A Lot 6 Click Score: 18   End of Session Equipment Utilized During Treatment: Gait belt;Rolling walker (2 wheels) Nurse Communication: Mobility status  Activity Tolerance: Patient limited by pain Patient left: in chair;with call bell/phone within reach;with chair alarm set  OT Visit Diagnosis: Unsteadiness on feet (R26.81);Muscle weakness (generalized) (M62.81);History of falling (Z91.81);Pain Pain - Right/Left: Right Pain - part of body: Leg  Time: 4098-1191 OT Time Calculation (min): 20 min Charges:  OT General Charges $OT Visit: 1 Visit OT Evaluation $OT Eval Moderate Complexity: 1 Mod  11/22/2022  RP, OTR/L  Acute Rehabilitation Services  Office:  [phone]   Suzanna Obey 11/22/2022, 9:55 AM

## 2022-11-23 DIAGNOSIS — I10 Essential (primary) hypertension: Secondary | ICD-10-CM | POA: Diagnosis not present

## 2022-11-23 DIAGNOSIS — S72491A Other fracture of lower end of right femur, initial encounter for closed fracture: Secondary | ICD-10-CM | POA: Diagnosis not present

## 2022-11-23 DIAGNOSIS — E782 Mixed hyperlipidemia: Secondary | ICD-10-CM | POA: Diagnosis not present

## 2022-11-23 DIAGNOSIS — I2510 Atherosclerotic heart disease of native coronary artery without angina pectoris: Secondary | ICD-10-CM | POA: Diagnosis not present

## 2022-11-23 LAB — CBC
HCT: 35 % — ABNORMAL LOW (ref 39.0–52.0)
Hemoglobin: 11.4 g/dL — ABNORMAL LOW (ref 13.0–17.0)
MCH: 25.6 pg — ABNORMAL LOW (ref 26.0–34.0)
MCHC: 32.6 g/dL (ref 30.0–36.0)
MCV: 78.7 fL — ABNORMAL LOW (ref 80.0–100.0)
Platelets: 200 10*3/uL (ref 150–400)
RBC: 4.45 MIL/uL (ref 4.22–5.81)
RDW: 14.8 % (ref 11.5–15.5)
WBC: 8 10*3/uL (ref 4.0–10.5)
nRBC: 0 % (ref 0.0–0.2)

## 2022-11-23 LAB — BASIC METABOLIC PANEL
Anion gap: 9 (ref 5–15)
BUN: 17 mg/dL (ref 8–23)
CO2: 23 mmol/L (ref 22–32)
Calcium: 8.2 mg/dL — ABNORMAL LOW (ref 8.9–10.3)
Chloride: 106 mmol/L (ref 98–111)
Creatinine, Ser: 0.84 mg/dL (ref 0.61–1.24)
GFR, Estimated: >60 mL/min (ref >60)
Glucose, Bld: 169 mg/dL — ABNORMAL HIGH (ref 70–99)
Potassium: 3.6 mmol/L (ref 3.5–5.1)
Sodium: 138 mmol/L (ref 135–145)

## 2022-11-23 LAB — GLUCOSE, CAPILLARY
Glucose-Capillary: 146 mg/dL — ABNORMAL HIGH (ref 70–99)
Glucose-Capillary: 224 mg/dL — ABNORMAL HIGH (ref 70–99)
Glucose-Capillary: 257 mg/dL — ABNORMAL HIGH (ref 70–99)
Glucose-Capillary: 165 mg/dL — ABNORMAL HIGH (ref 70–99)

## 2022-11-23 MED ORDER — VITAMIN D (ERGOCALCIFEROL) 1.25 MG (50000 UNIT) PO CAPS: 50000.0000 [IU] | ORAL_CAPSULE | ORAL | 0 refills | Status: DC

## 2022-11-23 MED ORDER — METHOCARBAMOL 500 MG PO TABS: 500.0000 mg | ORAL_TABLET | Freq: Four times a day (QID) | ORAL | 0 refills | Status: DC | PRN

## 2022-11-23 MED ORDER — SORBITOL 70 % SOLN: 30.0000 mL | Freq: Every day | Status: DC | PRN

## 2022-11-23 MED ORDER — APIXABAN 2.5 MG PO TABS: 2.5000 mg | ORAL_TABLET | Freq: Two times a day (BID) | ORAL | 0 refills | Status: DC

## 2022-11-23 MED ORDER — MILK AND MOLASSES ENEMA
1.0000 | Freq: Once | RECTAL | Status: AC
  Administered 2022-11-23: 240 mL via RECTAL
  Filled 2022-11-23: qty 240

## 2022-11-23 MED ORDER — EMPAGLIFLOZIN 25 MG PO TABS
25.0000 mg | ORAL_TABLET | Freq: Every day | ORAL | Status: DC
  Administered 2022-11-23 – 2022-11-24 (×2): 25 mg via ORAL
  Filled 2022-11-23 (×2): qty 1

## 2022-11-23 MED ORDER — OXYCODONE HCL 5 MG PO TABS: 5.0000 mg | ORAL_TABLET | ORAL | 0 refills | Status: DC | PRN

## 2022-11-23 NOTE — Anesthesia Postprocedure Evaluation (Signed)
Anesthesia Post Note  Patient: Michael Rose  Procedure(s) Performed: OPEN REDUCTION INTERNAL FIXATION (ORIF) DISTAL FEMUR FRACTURE (Right: Leg Upper)     Patient location during evaluation: PACU Anesthesia Type: General Level of consciousness: awake and alert Pain management: pain level controlled Vital Signs Assessment: post-procedure vital signs reviewed and stable Respiratory status: spontaneous breathing, nonlabored ventilation, respiratory function stable and patient connected to nasal cannula oxygen Cardiovascular status: blood pressure returned to baseline and stable Postop Assessment: no apparent nausea or vomiting Anesthetic complications: no   No notable events documented.  Last Vitals:  Vitals:   11/23/22 0418 11/23/22 0712  BP: 132/69 (!) 148/60  Pulse: 84 88  Resp: 18 17  Temp:  37.2 C  SpO2: 94% 95%    Last Pain:  Vitals:   11/23/22 9147  TempSrc: Oral  PainSc:                  Kennieth Rad

## 2022-11-23 NOTE — Progress Notes (Signed)
PROGRESS NOTE    Michael Rose  BMW:[id number] DOB: 1950/10/03 DOA: 11/19/2022 PCP: [name], MD    Chief Complaint  Patient presents with   Fall    Brief Narrative: Patient 72 year old gentleman history of CAD status post stenting in 2008, bilateral knee OA status post bilateral TKR with chronic ambulatory dysfunction, hypertension, type 2 diabetes, hyperlipidemia, depression/anxiety, GERD presented with a fall and bilateral knee pain.  Imaging done in the ED concerning for a right knee distal femoral shaft emanated fracture.  Orthopedics consulted.  Patient subsequently underwent ORIF of right periprosthetic distal femur fracture on 11/20/2022.   Assessment & Plan:   Principal Problem:   Femoral fracture (HCC) Active Problems:   Mixed hyperlipidemia   MORBID OBESITY   Essential hypertension, benign   CORONARY ATHEROSCLEROSIS NATIVE CORONARY ARTERY   Closed fracture of right distal femur (HCC)   Type 2 diabetes mellitus without complication, without long-term current use of insulin (HCC)   Benign prostatic hyperplasia   Anxiety and depression   Leukocytosis   Vitamin D deficiency  #1 right periprostatic distal femur fracture -Secondary to mechanical fall. -Patient seen in consultation by orthopedics and subsequently underwent ORIF of right periprosthetic distal femur fracture,11/20/2022. -PT/OT assessed patient and recommending SNF placement.. -Wife concerned about how patient is going to be able to maneuver steps in the apartment complex on discharge. -Pain management, DVT prophylaxis per orthopedics. -Appreciate orthopedics input and recommendations.  2.  Hypertension -Patient on admission noted to have elevated blood pressure felt in part secondary to pain from acute fracture. -Blood pressure improved postoperatively.   -Continue Imdur, Toprol-XL.  3.  Leukocytosis -Likely reactive leukocytosis secondary to problem #1. -Leukocytosis trending down.   -No signs or  symptoms of infection.   4.  History of CAD -Asymptomatic. -2D echo obtained with a EF of 55 to 60%, NWMA, mild LVH, grade 1 diastolic dysfunction. -Continue statin, metoprolol, Imdur, aspirin.     5.  Depression/anxiety -Continue Remeron, trazodone.    6.  Diabetes mellitus type 2 -Hemoglobin A1c 6.8 (11/19/2022) -CBG 146 this morning. -Patient on Jardiance, Tresiba, Ozempic, meal coverage Novolin.  -Resume home regimen Jardiance.  -Continue meal coverage NovoLog 3 units 3 times daily.   -SSI.    7.  BPH -Patient noted on day of admission to have some urinary retention requiring in and out catheter.   -Patient with good urine output postoperatively with a urine output of 2.250 L over the past 24 hours. -Continue Flomax..  8.  Vitamin D deficiency -Vitamin D 25-hydroxy at 4.94. -Continue vitamin D 50,000 units q. weekly.   -Outpatient follow-up.  9.  Constipation -Patient with complaints of constipation. -Patient currently on laxatives MiraLAX twice daily, Senokot-S twice daily and sorbitol with no results. -Milk and molasses enema x 1.   DVT prophylaxis: Postop DVT prophylaxis per orthopedics Lovenox started 11/21/2022 Code Status: Full Family Communication: Updated patient, no family at bedside.  Disposition: SNF when medically stable hopefully tomorrow.   Status is: Inpatient Remains inpatient appropriate because: Severity of illness   Consultants:  Orthopedics: Dr. [name] 11/20/2022  Procedures:  Chest x-ray 11/19/2022 Plain films of the right knee 11/19/2022 Plain films of the left knee 11/19/2022 Plain films of the right femur 11/20/2022 2D echo 11/20/2022 ORIF of right periprosthetic distal femur fracture: Per Dr. [name] orthopedics 11/20/2022  Antimicrobials:  Anti-infectives (From admission, onward)    Start     Dose/Rate Route Frequency Ordered Stop   11/20/22 2000  ceFAZolin (ANCEF) IVPB  2g/100 mL premix        2 g 200 mL/hr over 30 Minutes Intravenous  Every 8 hours 11/20/22 1520 11/21/22 1349   11/20/22 1339  vancomycin (VANCOCIN) powder  Status:  Discontinued          As needed 11/20/22 1339 11/20/22 1407   11/20/22 1200  ceFAZolin (ANCEF) IVPB 2g/100 mL premix        2 g 200 mL/hr over 30 Minutes Intravenous On call to O.R. 11/20/22 1100 11/20/22 1305   11/20/22 1102  ceFAZolin (ANCEF) 2-4 GM/100ML-% IVPB       Note to Pharmacy: Lurena Nida: cabinet override      11/20/22 1102 11/20/22 2046         Subjective: Patient with complaints of constipation and has not had a bowel movement even with laxatives yesterday.  Denies any chest pain or shortness of breath.  No abdominal pain.  States dressing has been removed from right leg this morning by orthopedics.    Objective: Vitals:   11/22/22 1436 11/22/22 1940 11/23/22 0418 11/23/22 0712  BP: 115/69 138/84 132/69 (!) 148/60  Pulse: 89 86 84 88  Resp: 18 17 18 17   Temp: 98.4 F (36.9 C) 98.1 F (36.7 C)  99 F (37.2 C)  TempSrc: Oral Oral  Oral  SpO2: 96% 96% 94% 95%  Weight:      Height:        Intake/Output Summary (Last 24 hours) at 11/23/2022 1008 Last data filed at 11/23/2022 0529 Gross per 24 hour  Intake 240 ml  Output 1800 ml  Net -1560 ml    Filed Weights   11/19/22 1620 11/20/22 1120  Weight: 103.9 kg 103.8 kg    Examination:  General exam: NAD. Respiratory system: CTAB.  No wheezes, no crackles, no rhonchi.  Fair air movement.  Cardiovascular system: RRR no murmurs rubs or gallops.  No JVD.  No lower extremity edema.  Gastrointestinal system: Abdomen is soft, nontender, nondistended, positive bowel sounds.  No rebound.  No guarding.   Central nervous system: Alert and oriented. No focal neurological deficits. Extremities: Right lower extremity incision site c/d/I. Skin: No rashes, lesions or ulcers Psychiatry: Judgement and insight appear normal. Mood & affect appropriate.     Data Reviewed: I have personally reviewed following labs and imaging  studies  CBC: Recent Labs  Lab 11/19/22 1535 11/20/22 0625 11/21/22 0441 11/22/22 0232 11/23/22 0726  WBC 12.1* 7.0 11.7* 10.2 8.0  NEUTROABS 9.6*  --   --   --   --   HGB 14.5 12.6* 11.6* 10.7* 11.4*  HCT 44.7 39.3 35.9* 32.1* 35.0*  MCV 79.8* 80.0 80.5 79.5* 78.7*  PLT 184 175 167 171 200     Basic Metabolic Panel: Recent Labs  Lab 11/19/22 1535 11/21/22 0441 11/22/22 0232 11/23/22 0726  NA 140 139 140 138  K 3.6 4.0 3.7 3.6  CL 104 107 107 106  CO2 23 23 26 23   GLUCOSE 113* 181* 178* 169*  BUN 19 23 21 17   CREATININE 0.86 0.94 0.80 0.84  CALCIUM 8.7* 8.3* 8.3* 8.2*  MG  --  2.1  --   --      GFR: Estimated Creatinine Clearance: 97.5 mL/min (by C-G formula based on SCr of 0.84 mg/dL).  Liver Function Tests: No results for input(s): "AST", "ALT", "ALKPHOS", "BILITOT", "PROT", "ALBUMIN" in the last 168 hours.  CBG: Recent Labs  Lab 11/22/22 0840 11/22/22 1130 11/22/22 1800 11/22/22 2306 11/23/22 1308  GLUCAP 137* 209* 275* 176* 146*      No results found for this or any previous visit (from the past 240 hour(s)).       Radiology Studies: No results found.      Scheduled Meds:  acetaminophen  1,000 mg Oral Q8H   aspirin  81 mg Oral Daily   atorvastatin  10 mg Oral QHS   docusate sodium  100 mg Oral BID   enoxaparin (LOVENOX) injection  40 mg Subcutaneous Q24H   feeding supplement  237 mL Oral BID BM   fluvoxaMINE  100 mg Oral Q breakfast   insulin aspart  0-15 Units Subcutaneous TID WC   insulin aspart  0-5 Units Subcutaneous QHS   insulin aspart  3 Units Subcutaneous TID WC   isosorbide mononitrate  30 mg Oral Daily   loratadine  10 mg Oral Daily   metoprolol succinate  25 mg Oral QHS   metoprolol succinate  50 mg Oral Daily   milk and molasses  1 enema Rectal Once   mirtazapine  30 mg Oral QHS   multivitamin with minerals  1 tablet Oral Daily   pantoprazole  40 mg Oral Daily   polyethylene glycol  17 g Oral BID    senna-docusate  1 tablet Oral BID   tamsulosin  0.4 mg Oral QPC supper   traZODone  50 mg Oral QHS   Vitamin D (Ergocalciferol)  50,000 Units Oral Q7 days   Continuous Infusions:  methocarbamol (ROBAXIN) IV       LOS: 4 days    Time spent: 35 minutes    Ramiro Harvest, MD [facility]   To contact the attending provider between 7A-7P or the covering provider during after hours 7P-7A, please log into the web site [email or web address] and access using universal [place] password for that web site. If you do not have the password, please call the hospital operator.  11/23/2022, 10:08 AM

## 2022-11-23 NOTE — Progress Notes (Signed)
Orthopaedic Trauma Progress Note  SUBJECTIVE:Doing ok this AM. Notes moderate to severe pain in right leg. Recently received Tylenol.  Notes difficulty working with therapies over the weekend due to pain.  Patient passing gas but no BM x 3 days.  Received sorbitol yesterday.  Will give another dose this morning.  No chest pain. No SOB. No nausea/vomiting. No other complaints.   OBJECTIVE:  Vitals:   11/23/22 0418 11/23/22 0712  BP: 132/69 (!) 148/60  Pulse: 84 88  Resp: 18 17  Temp:  99 F (37.2 C)  SpO2: 94% 95%    General: Sitting up in bed watching TV, no acute distress.  Pleasant and cooperative Respiratory: No increased work of breathing.  Right lower extremity: Dressings removed, incisions clean, dry, intact with Dermabond in place.  Tender just above the knee and throughout the thigh as expected.  No significant calf tenderness.  Ankle DF/PF is intact.  Endorses sensation to light touch over all aspects of the foot. + DP pulse  IMAGING: Stable post op imaging.   LABS:  Results for orders placed or performed during the hospital encounter of 11/19/22 (from the past 24 hour(s))  Glucose, capillary     Status: Abnormal   Collection Time: 11/22/22  8:40 AM  Result Value Ref Range   Glucose-Capillary 137 (H) 70 - 99 mg/dL  Glucose, capillary     Status: Abnormal   Collection Time: 11/22/22 11:30 AM  Result Value Ref Range   Glucose-Capillary 209 (H) 70 - 99 mg/dL  Glucose, capillary     Status: Abnormal   Collection Time: 11/22/22  6:00 PM  Result Value Ref Range   Glucose-Capillary 275 (H) 70 - 99 mg/dL  Glucose, capillary     Status: Abnormal   Collection Time: 11/22/22 11:06 PM  Result Value Ref Range   Glucose-Capillary 176 (H) 70 - 99 mg/dL  Glucose, capillary     Status: Abnormal   Collection Time: 11/23/22  7:11 AM  Result Value Ref Range   Glucose-Capillary 146 (H) 70 - 99 mg/dL    ASSESSMENT: Michael Rose is a 72 y.o. male, 3 Days Post-Op s/p OPEN REDUCTION  INTERNAL FIXATION RIGHT DISTAL FEMUR FRACTURE  CV/Blood loss: Acute blood loss anemia, Hgb 10.7 on 11/22/22. Hemodynamically stable  PLAN: Weightbearing: WBAT RLE ROM:  ok for unrestricted ROM  Incisional and dressing care: ok to leave open to air Showering:  ok to get incisions wet Orthopedic device(s): None  Pain management:  1. Tylenol 1000 mg q 8 hours scheduled 2. Robaxin 500 mg q 6 hours PRN 3. Oxycodone 5-10 mg q 4 hours PRN 4. Dilaudid 0.5 mg q 2 hours PRN VTE prophylaxis: Lovenox, SCDs ID:  Ancef 2gm post op Foley/Lines:  No foley, KVO IVFs Impediments to Fracture Healing: Vit D level 4, continue supplementation Dispo: PT/OT eval ongoing. Recommending SNF. Ok for d/c form ortho standpoint once cleared by medicine team and therapies. Will sign and place d/c rx into patient's chart  D/C recommendations: -Oxycodone and Robaxin for pain control -Eliquis 2.5 mg twice daily x 30 days for DVT prophylaxis -Continue 50,000 units Vit D supplementation x 5 weeks  Follow - up plan: 2 weeks after discharge for wound check and repeat x-rays   Contact information:  Truitt Merle MD, Thyra Breed PA-C. After hours and holidays please check [email or web address] for group call information for [facility]   Thompson Caul, PA-C [phone] (office) [email or web address]

## 2022-11-23 NOTE — TOC Initial Note (Addendum)
Transition of Care [facility]) - Initial/Assessment Note    Patient Details  Name: Michael Rose MRN: [id number] Date of Birth: November 10, 1950  Transition of Care [facility]) CM/SW Contact:    Lorri Frederick, LCSW Phone Number: 11/23/2022, 11:31 AM  Clinical Narrative:     CSW met with pt regarding DC recommendation for SNF.  Pt agreeable to this, has been to [facility] before, would like to go to [facility] ( he called it [place] [facility], name has changed)  Pt is from home with wife, permission given to speak with wife Kathie Rhodes, daughter Lanice Schwab, permission given to send out referral in hub.  Referral sent to [facility], reached out to [place] at that facility to review.      1245: [facility] does offer semi private room.  Pt agreeable to semi private.      1415: Auth request submitted in Navi and approved: [id number], 3 days: 6/25-6/27.           Expected Discharge Plan: Skilled Nursing Facility Barriers to Discharge: SNF Pending bed offer   Patient Goals and CMS Choice Patient states their goals for this hospitalization and ongoing recovery are:: walk, enjoy life   Choice offered to / list presented to : Patient, Spouse (both requesting [facility])      Expected Discharge Plan and Services In-house Referral: Clinical Social Work   Post Acute Care Choice: Skilled Nursing Facility Living arrangements for the past 2 months: Single Family Home                                      Prior Living Arrangements/Services Living arrangements for the past 2 months: Single Family Home Lives with:: Spouse Patient language and need for interpreter reviewed:: Yes Do you feel safe going back to the place where you live?: Yes      Need for Family Participation in Patient Care: Yes (Comment) Care giver support system in place?: Yes (comment) Current home services: Other (comment) (none) Criminal Activity/Legal Involvement Pertinent to Current Situation/Hospitalization: No - Comment as  needed  Activities of Daily Living Home Assistive Devices/Equipment: Cane (specify quad or straight), Walker (specify type), Shower chair with back (single cane, front wheel walker) ADL Screening (condition at time of admission) Patient's cognitive ability adequate to safely complete daily activities?: Yes Is the patient deaf or have difficulty hearing?: Yes Does the patient have difficulty seeing, even when wearing glasses/contacts?: No Does the patient have difficulty concentrating, remembering, or making decisions?: No Patient able to express need for assistance with ADLs?: Yes Does the patient have difficulty dressing or bathing?: No Independently performs ADLs?: Yes (appropriate for developmental age) Does the patient have difficulty walking or climbing stairs?: No Weakness of Legs: None Weakness of Arms/Hands: None  Permission Sought/Granted Permission sought to share information with : Family Supports Permission granted to share information with : Yes, Verbal Permission Granted  Share Information with NAME: wife Kathie Rhodes, daughter Melville           Emotional Assessment Appearance:: Appears stated age Attitude/Demeanor/Rapport: Engaged Affect (typically observed): Appropriate, Pleasant Orientation: : Oriented to Self, Oriented to Place, Oriented to  Time, Oriented to Situation      Admission diagnosis:  Femoral fracture (HCC) [S72.90XA] Closed fracture of distal end of right femur, unspecified fracture morphology, initial encounter (HCC) [S72.401A] Patient Active Problem List   Diagnosis Date Noted   Vitamin D deficiency 11/21/2022  Type 2 diabetes mellitus without complication, without long-term current use of insulin (HCC) 11/20/2022   Benign prostatic hyperplasia 11/20/2022   Anxiety and depression 11/20/2022   Leukocytosis 11/20/2022   Closed fracture of right distal femur (HCC) 11/19/2022   Femoral fracture (HCC) 11/19/2022   Mixed hyperlipidemia 07/08/2010   MORBID  OBESITY 07/08/2010   Essential hypertension, benign 07/08/2010   CORONARY ATHEROSCLEROSIS NATIVE CORONARY ARTERY 07/08/2010   PCP:  [name], MD Pharmacy:   [facility] - [place], [place] - [address] [place] [address] Phone: [phone] Fax: [phone]  [facility] - [place], [place] - [address] [place] [address] Phone: [phone] Fax: [phone]     Social Determinants of Health (SDOH) Social History: SDOH Screenings   Food Insecurity: Food Insecurity Present (11/20/2022)  Housing: Low Risk  (11/20/2022)  Transportation Needs: No Transportation Needs (11/20/2022)  Utilities: Not At Risk (11/20/2022)  Financial Resource Strain: High Risk (01/19/2022)  Tobacco Use: Low Risk  (11/22/2022)   SDOH Interventions:     Readmission Risk Interventions     No data to display

## 2022-11-23 NOTE — Inpatient Diabetes Management (Signed)
Inpatient Diabetes Program Recommendations  AACE/ADA: New Consensus Statement on Inpatient Glycemic Control (2015)  Target Ranges:  Prepandial:   less than 140 mg/dL      Peak postprandial:   less than 180 mg/dL (1-2 hours)      Critically ill patients:  140 - 180 mg/dL   Lab Results  Component Value Date   GLUCAP 146 (H) 11/23/2022   HGBA1C 6.8 (H) 11/19/2022    Review of Glycemic Control  Latest Reference Range & Units 11/22/22 11:30 11/22/22 18:00 11/22/22 23:06 11/23/22 07:11  Glucose-Capillary 70 - 99 mg/dL 161 (H) 096 (H) 045 (H) 146 (H)  (H): Data is abnormally high Diabetes history: Type 2 DM Outpatient Diabetes medications: Jardiance 25 mg every day, Amaryl 2 mg every day, NPH every day, Ozempic 1 mg qwk Current orders for Inpatient glycemic control: Novolog 3 units TID, Novolog 0-15 units TID & HS  Inpatient Diabetes Program Recommendations:     If to remain inpatient, consider further increasing meal coverage to Novolog 5 units TID (assuming patient consuming >50% of meals).   Thanks, Lujean Rave, MSN, RNC-OB Diabetes Coordinator [phone] (8a-5p)

## 2022-11-23 NOTE — Care Management Important Message (Signed)
**Note Michael-Identified via Obfuscation** Important Message  Patient Details  Name: Michael Rose MRN: [id number] Date of Birth: 1950/12/24   Medicare Important Message Given:  Yes     Sherilyn Banker 11/23/2022, 2:30 PM

## 2022-11-23 NOTE — NC FL2 (Signed)
MEDICAID FL2 LEVEL OF CARE FORM     IDENTIFICATION  Patient Name: Michael Rose Birthdate: 25-Jun-1950 Sex: male Admission Date (Current Location): 11/19/2022  [facility] and [place] Number:  Producer, television/film/video and Address:  [facility], [address] [place], [place] [address]      Provider Number: [id number]  Attending Physician Name and Address:  Rodolph Bong, MD  Relative Name and Phone Number:  Lewis, Keats [phone]    Current Level of Care: Hospital Recommended Level of Care: Skilled Nursing Facility Prior Approval Number:    Date Approved/Denied:   PASRR Number: [id number]  Discharge Plan: SNF    Current Diagnoses: Patient Active Problem List   Diagnosis Date Noted   Vitamin D deficiency 11/21/2022   Type 2 diabetes mellitus without complication, without long-term current use of insulin (HCC) 11/20/2022   Benign prostatic hyperplasia 11/20/2022   Anxiety and depression 11/20/2022   Leukocytosis 11/20/2022   Closed fracture of right distal femur (HCC) 11/19/2022   Femoral fracture (HCC) 11/19/2022   Mixed hyperlipidemia 07/08/2010   MORBID OBESITY 07/08/2010   Essential hypertension, benign 07/08/2010   CORONARY ATHEROSCLEROSIS NATIVE CORONARY ARTERY 07/08/2010    Orientation RESPIRATION BLADDER Height & Weight     Self, Time, Situation, Place  Normal Incontinent, External catheter Weight: 228 lb 13.4 oz (103.8 kg) Height:  5\' 11"  (180.3 cm)  BEHAVIORAL SYMPTOMS/MOOD NEUROLOGICAL BOWEL NUTRITION STATUS      Continent Diet (see discharge summary)  AMBULATORY STATUS COMMUNICATION OF NEEDS Skin   Total Care Verbally Surgical wounds                       Personal Care Assistance Level of Assistance  Bathing, Feeding, Dressing Bathing Assistance: Limited assistance Feeding assistance: Independent Dressing Assistance: Limited assistance     Functional Limitations Info  Sight, Hearing, Speech  Sight Info: Adequate Hearing Info: Impaired Speech Info: Adequate    SPECIAL CARE FACTORS FREQUENCY  PT (By licensed PT), OT (By licensed OT)     PT Frequency: 5x week OT Frequency: 5x week            Contractures Contractures Info: Not present    Additional Factors Info  Code Status, Allergies, Insulin Sliding Scale Code Status Info: full Allergies Info: Iodinated Contrast Media, Penicillins   Insulin Sliding Scale Info: Novolog: see discharge summary       Current Medications (11/23/2022):  This is the current hospital active medication list Current Facility-Administered Medications  Medication Dose Route Frequency Provider Last Rate Last Admin   acetaminophen (TYLENOL) tablet 1,000 mg  1,000 mg Oral Q8H McBane, Caroline N, PA-C   1,000 mg at 11/23/22 8469   aspirin chewable tablet 81 mg  81 mg Oral Daily West Bali, PA-C   81 mg at 11/23/22 1008   atorvastatin (LIPITOR) tablet 10 mg  10 mg Oral QHS West Bali, PA-C   10 mg at 11/22/22 2112   bisacodyl (DULCOLAX) EC tablet 5 mg  5 mg Oral Daily PRN West Bali, PA-C       docusate sodium (COLACE) capsule 100 mg  100 mg Oral BID Thyra Breed A, PA-C   100 mg at 11/23/22 1008   enoxaparin (LOVENOX) injection 40 mg  40 mg Subcutaneous Q24H Thyra Breed A, PA-C   40 mg at 11/23/22 1008   feeding supplement (ENSURE ENLIVE / ENSURE PLUS) liquid 237 mL  237 mL Oral BID  BM Rodolph Bong, MD   237 mL at 11/23/22 1008   fluvoxaMINE (LUVOX) tablet 100 mg  100 mg Oral Q breakfast West Bali, PA-C   100 mg at 11/23/22 1008   hydrALAZINE (APRESOLINE) tablet 25 mg  25 mg Oral Q6H PRN West Bali, PA-C       HYDROmorphone (DILAUDID) injection 0.5 mg  0.5 mg Intravenous Q2H PRN West Bali, PA-C   0.5 mg at 11/22/22 2300   insulin aspart (novoLOG) injection 0-15 Units  0-15 Units Subcutaneous TID WC West Bali, PA-C   2 Units at 11/23/22 1009   insulin aspart (novoLOG) injection 0-5 Units  0-5  Units Subcutaneous QHS West Bali, PA-C   2 Units at 11/21/22 2213   insulin aspart (novoLOG) injection 3 Units  3 Units Subcutaneous TID WC Rodolph Bong, MD   3 Units at 11/23/22 1008   isosorbide mononitrate (IMDUR) 24 hr tablet 30 mg  30 mg Oral Daily Thyra Breed A, PA-C   30 mg at 11/23/22 1008   loratadine (CLARITIN) tablet 10 mg  10 mg Oral Daily West Bali, PA-C   10 mg at 11/23/22 1008   methocarbamol (ROBAXIN) tablet 500 mg  500 mg Oral Q6H PRN West Bali, PA-C   500 mg at 11/23/22 0310   Or   methocarbamol (ROBAXIN) 500 mg in dextrose 5 % 50 mL IVPB  500 mg Intravenous Q6H PRN West Bali, PA-C       metoCLOPramide (REGLAN) tablet 5-10 mg  5-10 mg Oral Q8H PRN Sharon Seller, Sarah A, PA-C       Or   metoCLOPramide (REGLAN) injection 5-10 mg  5-10 mg Intravenous Q8H PRN West Bali, PA-C       metoprolol succinate (TOPROL-XL) 24 hr tablet 25 mg  25 mg Oral QHS Thyra Breed A, PA-C   25 mg at 11/22/22 2112   metoprolol succinate (TOPROL-XL) 24 hr tablet 50 mg  50 mg Oral Daily West Bali, PA-C   50 mg at 11/23/22 1008   mirtazapine (REMERON) tablet 30 mg  30 mg Oral QHS West Bali, PA-C   30 mg at 11/22/22 2112   multivitamin with minerals tablet 1 tablet  1 tablet Oral Daily Rodolph Bong, MD   1 tablet at 11/23/22 1008   ondansetron (ZOFRAN) tablet 4 mg  4 mg Oral Q6H PRN West Bali, PA-C       Or   ondansetron (ZOFRAN) injection 4 mg  4 mg Intravenous Q6H PRN Thyra Breed A, PA-C       oxyCODONE (Oxy IR/ROXICODONE) immediate release tablet 5-10 mg  5-10 mg Oral Q4H PRN West Bali, PA-C   5 mg at 11/23/22 1008   pantoprazole (PROTONIX) EC tablet 40 mg  40 mg Oral Daily Thyra Breed A, PA-C   40 mg at 11/23/22 1008   polyethylene glycol (MIRALAX / GLYCOLAX) packet 17 g  17 g Oral BID Rodolph Bong, MD   17 g at 11/23/22 1007   senna-docusate (Senokot-S) tablet 1 tablet  1 tablet Oral BID West Bali, PA-C   1  tablet at 11/23/22 1008   sorbitol 70 % solution 30 mL  30 mL Oral Daily PRN West Bali, PA-C       tamsulosin (FLOMAX) capsule 0.4 mg  0.4 mg Oral QPC supper Thyra Breed A, PA-C   0.4 mg at 11/22/22 1756   traZODone (DESYREL) tablet  50 mg  50 mg Oral QHS West Bali, PA-C   50 mg at 11/22/22 2112   Vitamin D (Ergocalciferol) (DRISDOL) 1.25 MG (50000 UNIT) capsule 50,000 Units  50,000 Units Oral Q7 days Rodolph Bong, MD   50,000 Units at 11/21/22 1328     Discharge Medications: Please see discharge summary for a list of discharge medications.  Relevant Imaging Results:  Relevant Lab Results:   Additional Information SSN:[id number]  Lorri Frederick, LCSW

## 2022-11-24 DIAGNOSIS — E782 Mixed hyperlipidemia: Secondary | ICD-10-CM | POA: Diagnosis not present

## 2022-11-24 DIAGNOSIS — E559 Vitamin D deficiency, unspecified: Secondary | ICD-10-CM | POA: Diagnosis not present

## 2022-11-24 DIAGNOSIS — Z7401 Bed confinement status: Secondary | ICD-10-CM | POA: Diagnosis not present

## 2022-11-24 DIAGNOSIS — S72451D Displaced supracondylar fracture without intracondylar extension of lower end of right femur, subsequent encounter for closed fracture with routine healing: Secondary | ICD-10-CM | POA: Diagnosis not present

## 2022-11-24 DIAGNOSIS — E119 Type 2 diabetes mellitus without complications: Secondary | ICD-10-CM | POA: Diagnosis not present

## 2022-11-24 DIAGNOSIS — I10 Essential (primary) hypertension: Secondary | ICD-10-CM | POA: Diagnosis not present

## 2022-11-24 DIAGNOSIS — M6281 Muscle weakness (generalized): Secondary | ICD-10-CM | POA: Diagnosis not present

## 2022-11-24 DIAGNOSIS — R52 Pain, unspecified: Secondary | ICD-10-CM | POA: Diagnosis not present

## 2022-11-24 DIAGNOSIS — S72401A Unspecified fracture of lower end of right femur, initial encounter for closed fracture: Secondary | ICD-10-CM | POA: Diagnosis not present

## 2022-11-24 DIAGNOSIS — F419 Anxiety disorder, unspecified: Secondary | ICD-10-CM | POA: Diagnosis not present

## 2022-11-24 DIAGNOSIS — M9711XD Periprosthetic fracture around internal prosthetic right knee joint, subsequent encounter: Secondary | ICD-10-CM | POA: Diagnosis not present

## 2022-11-24 DIAGNOSIS — K219 Gastro-esophageal reflux disease without esophagitis: Secondary | ICD-10-CM | POA: Diagnosis not present

## 2022-11-24 DIAGNOSIS — D72829 Elevated white blood cell count, unspecified: Secondary | ICD-10-CM | POA: Diagnosis not present

## 2022-11-24 DIAGNOSIS — E785 Hyperlipidemia, unspecified: Secondary | ICD-10-CM | POA: Diagnosis not present

## 2022-11-24 DIAGNOSIS — N400 Enlarged prostate without lower urinary tract symptoms: Secondary | ICD-10-CM | POA: Diagnosis not present

## 2022-11-24 DIAGNOSIS — F32A Depression, unspecified: Secondary | ICD-10-CM | POA: Diagnosis not present

## 2022-11-24 DIAGNOSIS — W19XXXA Unspecified fall, initial encounter: Secondary | ICD-10-CM | POA: Diagnosis not present

## 2022-11-24 DIAGNOSIS — R5381 Other malaise: Secondary | ICD-10-CM | POA: Diagnosis not present

## 2022-11-24 DIAGNOSIS — R29898 Other symptoms and signs involving the musculoskeletal system: Secondary | ICD-10-CM | POA: Diagnosis not present

## 2022-11-24 DIAGNOSIS — S72491A Other fracture of lower end of right femur, initial encounter for closed fracture: Secondary | ICD-10-CM | POA: Diagnosis not present

## 2022-11-24 DIAGNOSIS — I2510 Atherosclerotic heart disease of native coronary artery without angina pectoris: Secondary | ICD-10-CM | POA: Diagnosis not present

## 2022-11-24 DIAGNOSIS — F319 Bipolar disorder, unspecified: Secondary | ICD-10-CM | POA: Diagnosis not present

## 2022-11-24 LAB — BASIC METABOLIC PANEL
Anion gap: 9 (ref 5–15)
BUN: 19 mg/dL (ref 8–23)
CO2: 23 mmol/L (ref 22–32)
Calcium: 8.3 mg/dL — ABNORMAL LOW (ref 8.9–10.3)
Chloride: 105 mmol/L (ref 98–111)
Creatinine, Ser: 0.99 mg/dL (ref 0.61–1.24)
GFR, Estimated: >60 mL/min (ref >60)
Glucose, Bld: 167 mg/dL — ABNORMAL HIGH (ref 70–99)
Potassium: 4 mmol/L (ref 3.5–5.1)
Sodium: 137 mmol/L (ref 135–145)

## 2022-11-24 LAB — GLUCOSE, CAPILLARY
Glucose-Capillary: 155 mg/dL — ABNORMAL HIGH (ref 70–99)
Glucose-Capillary: 202 mg/dL — ABNORMAL HIGH (ref 70–99)
Glucose-Capillary: 113 mg/dL — ABNORMAL HIGH (ref 70–99)

## 2022-11-24 LAB — CBC
HCT: 35.6 % — ABNORMAL LOW (ref 39.0–52.0)
Hemoglobin: 11.4 g/dL — ABNORMAL LOW (ref 13.0–17.0)
MCH: 25.6 pg — ABNORMAL LOW (ref 26.0–34.0)
MCHC: 32 g/dL (ref 30.0–36.0)
MCV: 79.8 fL — ABNORMAL LOW (ref 80.0–100.0)
Platelets: 227 10*3/uL (ref 150–400)
RBC: 4.46 MIL/uL (ref 4.22–5.81)
RDW: 14.8 % (ref 11.5–15.5)
WBC: 9 10*3/uL (ref 4.0–10.5)
nRBC: 0 % (ref 0.0–0.2)

## 2022-11-24 MED ORDER — SENNOSIDES-DOCUSATE SODIUM 8.6-50 MG PO TABS: 1.0000 | ORAL_TABLET | Freq: Two times a day (BID) | ORAL | Status: DC

## 2022-11-24 MED ORDER — ENSURE ENLIVE PO LIQD: 237.0000 mL | Freq: Two times a day (BID) | ORAL | 12 refills | Status: DC

## 2022-11-24 MED ORDER — POLYETHYLENE GLYCOL 3350 17 G PO PACK: 17.0000 g | PACK | Freq: Two times a day (BID) | ORAL | 0 refills | Status: DC

## 2022-11-24 MED ORDER — ADULT MULTIVITAMIN W/MINERALS CH: 1.0000 | ORAL_TABLET | Freq: Every day | ORAL | Status: DC

## 2022-11-24 NOTE — Inpatient Diabetes Management (Signed)
Inpatient Diabetes Program Recommendations  AACE/ADA: New Consensus Statement on Inpatient Glycemic Control (2015)  Target Ranges:  Prepandial:   less than 140 mg/dL      Peak postprandial:   less than 180 mg/dL (1-2 hours)      Critically ill patients:  140 - 180 mg/dL   Lab Results  Component Value Date   GLUCAP 155 (H) 11/24/2022   HGBA1C 6.8 (H) 11/19/2022    Review of Glycemic Control  Latest Reference Range & Units 11/23/22 07:11 11/23/22 11:14 11/23/22 16:12 11/23/22 21:25 11/24/22 07:17  Glucose-Capillary 70 - 99 mg/dL 098 (H) 119 (H) 147 (H) 165 (H) 155 (H)  (H): Data is abnormally high  Diabetes history: Type 2 DM Outpatient Diabetes medications: Jardiance 25 mg every day, Amaryl 2 mg every day, NPH every day, Ozempic 1 mg qwk Current orders for Inpatient glycemic control: Novolog 3 units TID, Novolog 0-15 units TID & HS   Inpatient Diabetes Program Recommendations:      If to remain inpatient, consider further increasing meal coverage to Novolog 5 units TID (assuming patient consuming >50% of meals).   Will continue to follow while inpatient.  Thank you, Dulce Sellar, MSN, CDCES Diabetes Coordinator Inpatient Diabetes Program [phone] (team pager from 8a-5p)

## 2022-11-24 NOTE — TOC Transition Note (Signed)
Transition of Care [facility]) - CM/SW Discharge Note   Patient Details  Name: Michael Rose MRN: [id number] Date of Birth: 08-28-1950  [facility]) CM/SW Contact:  Lorri Frederick, LCSW Phone Number: 11/24/2022, 2:10 PM   Clinical Narrative:   Pt discharging to [facility], room 504-1.  RN call report to [phone].      Final next level of care: Skilled Nursing Facility Barriers to Discharge: Barriers Resolved   Patient Goals and CMS Choice   Choice offered to / list presented to : Patient, Spouse (both requesting [facility])  Discharge Placement                Patient chooses bed at:  ([facility]) Patient to be transferred to facility by: [facility] Name of family member notified: wife Kathie Rhodes Patient and family notified of of transfer: 11/24/22  Discharge Plan and Services Additional resources added to the After Visit Summary for   In-house Referral: Clinical Social Work   Post Acute Care Choice: Skilled Nursing Facility                               Social Determinants of Health (SDOH) Interventions SDOH Screenings   Food Insecurity: Food Insecurity Present (11/20/2022)  Housing: Low Risk  (11/20/2022)  Transportation Needs: No Transportation Needs (11/20/2022)  Utilities: Not At Risk (11/20/2022)  Financial Resource Strain: High Risk (01/19/2022)  Tobacco Use: Low Risk  (11/22/2022)     Readmission Risk Interventions     No data to display

## 2022-11-24 NOTE — Progress Notes (Signed)
Physical Therapy Treatment Patient Details Name: Michael Rose MRN: [id number] DOB: Sep 03, 1950 Today's Date: 11/24/2022   History of Present Illness Pt is a 72 y.o. male presenting 11/19/2022 with 2 falls and bil knee pain. X-ray showed R knee distal femoral shaft emanated fracture; L knee with no fracture or dislocation. Now s/p ORIF R periprosthetic distal femur fracture. PMH significant for CAD s/p stenting in 2008, bilateral knee OA status post bilateral TKR with chronic ambulation dysfunction, HTN, IIDM, HLD, anxiety/depression, GERD.    PT Comments    Pt making great progress this am as evident by decreased assistance and progress to short bout of gt training with close chair follow.  Pt frustrated with his limitations but motivated to get to rehab and improve strength before returning home.  Pt had successful BM this session and nursing informed.  Plan remains for recommendations for rehab in a post acute setting.      Recommendations for follow up therapy are one component of a multi-disciplinary discharge planning process, led by the attending physician.  Recommendations may be updated based on patient status, additional functional criteria and insurance authorization.  Follow Up Recommendations       Assistance Recommended at Discharge Frequent or constant Supervision/Assistance  Patient can return home with the following A lot of help with bathing/dressing/bathroom;Two people to help with walking and/or transfers;Assist for transportation;Help with stairs or ramp for entrance   Equipment Recommendations  Other (comment) (TBD)    Recommendations for Other Services       Precautions / Restrictions Precautions Precautions: Fall Restrictions Weight Bearing Restrictions: Yes RLE Weight Bearing: Weight bearing as tolerated     Mobility  Bed Mobility Overal bed mobility: Needs Assistance Bed Mobility: Supine to Sit, Sit to Supine     Supine to sit: Min assist      General bed mobility comments: assist with trunk and RLE OOB, reached for support to pull trunk into seated position.    Transfers Overall transfer level: Needs assistance Equipment used: Rolling walker (2 wheels) Transfers: Sit to/from Stand, Bed to chair/wheelchair/BSC Sit to Stand: Min assist, From elevated surface           General transfer comment: Cues for hand placement to and from seated surface, he performed from elevated bed and BSC.  He had improved success from last session.    Ambulation/Gait Ambulation/Gait assistance: Min assist Gait Distance (Feet): 10 Feet Assistive device: Rolling walker (2 wheels) Gait Pattern/deviations: Step-to pattern, Trunk flexed, Antalgic, Decreased dorsiflexion - right, Decreased stride length       General Gait Details: Cues for upper trunk control and RW safety.  Presents with step to pattern this session.  He fatigues quickly but gives good effort.   Stairs             Wheelchair Mobility    Modified Rankin (Stroke Patients Only)       Balance Overall balance assessment: Needs assistance   Sitting balance-Leahy Scale: Good       Standing balance-Leahy Scale: Poor Standing balance comment: reliant on RW                            Cognition Arousal/Alertness: Awake/alert Behavior During Therapy: Impulsive Overall Cognitive Status: Within Functional Limits for tasks assessed  Exercises General Exercises - Lower Extremity Ankle Circles/Pumps: AROM, Both, 10 reps, Supine Quad Sets: AROM, Right, 10 reps, Supine Heel Slides: AROM, Right, 10 reps, Supine    General Comments        Pertinent Vitals/Pain Pain Assessment Pain Assessment: Faces Faces Pain Scale: Hurts little more (but patient said pain is not that bad) Pain Location: R knee with standing attempt Pain Descriptors / Indicators: Discomfort, Grimacing, Guarding Pain  Intervention(s): Monitored during session, Repositioned (refused ice pack application)    Home Living                          Prior Function            PT Goals (current goals can now be found in the care plan section) Acute Rehab PT Goals Patient Stated Goal: go to rehab in [place] Potential to Achieve Goals: Good Progress towards PT goals: Progressing toward goals    Frequency    Min 3X/week      PT Plan Current plan remains appropriate    Co-evaluation              AM-PAC PT "6 Clicks" Mobility   Outcome Measure  Help needed turning from your back to your side while in a flat bed without using bedrails?: A Little Help needed moving from lying on your back to sitting on the side of a flat bed without using bedrails?: A Little Help needed moving to and from a bed to a chair (including a wheelchair)?: A Little Help needed standing up from a chair using your arms (e.g., wheelchair or bedside chair)?: A Lot Help needed to walk in hospital room?: A Lot Help needed climbing 3-5 steps with a railing? : Total 6 Click Score: 14    End of Session Equipment Utilized During Treatment: Gait belt Activity Tolerance: Patient limited by pain Patient left: with call bell/phone within reach;in chair;with chair alarm set Nurse Communication: Mobility status;Other (comment) (had BM.) PT Visit Diagnosis: Pain;Difficulty in walking, not elsewhere classified (R26.2) Pain - Right/Left: Right Pain - part of body: Knee     Time: 8469-6295 PT Time Calculation (min) (ACUTE ONLY): 24 min  Charges:  $Gait Training: 8-22 mins $Therapeutic Exercise: 8-22 mins                     Bonney Leitz , PTA Acute Rehabilitation Services Office [phone]    Florestine Avers 11/24/2022, 10:57 AM

## 2022-11-24 NOTE — TOC Progression Note (Signed)
Transition of Care [facility]) - Progression Note    Patient Details  Name: Michael Rose MRN: [id number] Date of Birth: Mar 15, 1951  Transition of Care [facility]) CM/SW Contact  Lorri Frederick, LCSW Phone Number: 11/24/2022, 2:09 PM  Clinical Narrative:   CSW confirmed with [facility] that they can receive pt today. MD informed.    Expected Discharge Plan: Skilled Nursing Facility Barriers to Discharge: SNF Pending bed offer  Expected Discharge Plan and Services In-house Referral: Clinical Social Work   Post Acute Care Choice: Skilled Nursing Facility Living arrangements for the past 2 months: Single Family Home Expected Discharge Date: 11/24/22                                     Social Determinants of Health (SDOH) Interventions SDOH Screenings   Food Insecurity: Food Insecurity Present (11/20/2022)  Housing: Low Risk  (11/20/2022)  Transportation Needs: No Transportation Needs (11/20/2022)  Utilities: Not At Risk (11/20/2022)  Financial Resource Strain: High Risk (01/19/2022)  Tobacco Use: Low Risk  (11/22/2022)    Readmission Risk Interventions     No data to display

## 2022-11-24 NOTE — Plan of Care (Signed)
  Problem: Health Behavior/Discharge Planning: Goal: Ability to manage health-related needs will improve Outcome: Progressing   Problem: Metabolic: Goal: Ability to maintain appropriate glucose levels will improve Outcome: Progressing   Problem: Skin Integrity: Goal: Risk for impaired skin integrity will decrease Outcome: Progressing   

## 2022-11-24 NOTE — Discharge Summary (Signed)
Physician Discharge Summary  [name]:[id number] DOB: [birth year] DOA: 11/19/2022  PCP: Michael Peri, MD  Admit date: 11/19/2022 Discharge date: 11/24/2022  Time spent: 60 minutes  Recommendations for Outpatient Follow-up:  Follow-up with MD at skilled nursing facility.  Patient will need a basic metabolic profile done in 1 week to follow-up on electrolytes and renal function.  Patient needs a H&H to follow-up on hemoglobin. Follow-up with Dr. Jena Rose, orthopedics in 2 weeks.   Discharge Diagnoses:  Principal Problem:   Femoral fracture (HCC) Active Problems:   Mixed hyperlipidemia   MORBID OBESITY   Essential hypertension, benign   CORONARY ATHEROSCLEROSIS NATIVE CORONARY ARTERY   Closed fracture of right distal femur (HCC)   Type 2 diabetes mellitus without complication, without long-term current use of insulin (HCC)   Benign prostatic hyperplasia   Anxiety and depression   Leukocytosis   Vitamin D deficiency   Discharge Condition: Stable and improved.  Diet recommendation: Carb modified diet  Filed Weights   11/19/22 1620 11/20/22 1120  Weight: 103.9 kg 103.8 kg    History of present illness:  HPI per Dr Michael Rose is a 72 y.o. male with medical history significant of CAD s/p stenting in 2008, bilateral knee OA status post bilateral TKR with chronic ambulation dysfunction, HTN, IIDM, HLD, anxiety/depression, GERD, presented with fall and bilateral knee pain.   Patient sustained 2 mechanical fall this morning.  First, he bent down to reach some tool on the floor, tipped over and hit his left knee.  He was able to stand up himself however after walking a few steps his left knee gave up and he fell again, but this time he fell on the right knee.  Excruciating pain on the right knee followed and he could not support himself to stand up again.  Family arrived and unable to hold him up due to his body weight.  He denies any loss of consciousness, denies any  prodrome of lightheadedness shortness of breath chest pain before or after the fall.  At baseline he has chronic ambulation dysfunction and usually uses a cane to ambulate, he lives in the apartment with 3 steps walking each day and he reported that he can walk 5 to 10 minutes without significant shortness of breath and no chest pains reported.  No recent ear stress test. ED Course: Afebrile, none tachycardia none hypotension.  X-ray showed right knee distal femoral shaft emanated fracture, x-ray on left knee showed no fracture or dislocation.  WBC 12.  Creatinine 0.8.   Orthopedic surgery Dr. [name] contacted and recommend patient transferred to [facility] for ORIF in the morning.  Hospital Course:  #1 right periprostatic distal femur fracture -Secondary to mechanical fall. -Patient seen in consultation by orthopedics and subsequently underwent ORIF of right periprosthetic distal femur fracture,11/20/2022. -PT/OT assessed patient and recommended SNF placement.. -Patient will be discharged to SNF. -Pain management, DVT prophylaxis per orthopedics. -Outpatient follow-up with orthopedics 2 weeks postdischarge.   2.  Hypertension -Patient on admission noted to have elevated blood pressure felt in part secondary to pain from acute fracture. -Blood pressure improved postoperatively.   -Patient maintained on home regimen Toprol-XL, Imdur.    3.  Leukocytosis -Likely reactive leukocytosis secondary to problem #1. -Leukocytosis trended down and had resolved by day of discharge. -No signs or symptoms of infection.    4.  History of CAD -Asymptomatic. -2D echo obtained with a EF of 55 to 60%, NWMA, mild LVH, grade 1  diastolic dysfunction. -Patient maintained on home regimen statin, metoprolol, Imdur, aspirin.      5.  Depression/anxiety -Patient maintained on home regimen Remeron, trazodone.     6.  Diabetes mellitus type 2 -Hemoglobin A1c 6.8 (11/19/2022) -Patient initially was  maintained on sliding scale insulin however patient's home regimen Jardiance was resumed in addition to NovoLog meal coverage with better blood glucose control.  -Patient noted to be on Jardiance, Tresiba, Ozempic, meal coverage Novolin.  -Outpatient follow-up   7.  BPH -Patient noted on day of admission to have some urinary retention requiring in and out catheter.   -Patient with good urine output postoperatively. -Patient maintained on home regimen Flomax.    8.  Vitamin D deficiency -Vitamin D 25-hydroxy at 4.94. -Patient maintained on vitamin D 50,000 units q. weekly.   -Outpatient follow-up.   9.  Constipation -Patient with complaints of constipation. -Patient was placed on laxatives MiraLAX twice daily, Senokot-S twice daily and sorbitol with no results. -Milk and molasses enema x 1 given with results. -Patient be discharged on bowel regimen of MiraLAX twice daily as well as Senokot-S twice daily. -Outpatient follow-up.    Procedures: Chest x-ray 11/19/2022 Plain films of the right knee 11/19/2022 Plain films of the left knee 11/19/2022 Plain films of the right femur 11/20/2022 2D echo 11/20/2022 ORIF of right periprosthetic distal femur fracture: Per Dr. Jena Rose orthopedics 11/20/2022  Consultations: Orthopedics: Dr. Jena Rose 11/20/2022   Discharge Exam: Vitals:   11/24/22 0544 11/24/22 0719  BP: 138/73 130/82  Pulse: 92 91  Resp: 18 17  Temp: 98.4 F (36.9 C) 98.7 F (37.1 C)  SpO2: 95% 95%    General: NAD Cardiovascular: RRR no murmurs rubs or gallops.  No JVD.  No lower extremity edema. Respiratory: Clear to auscultation bilaterally.  No wheezes, no crackles, no rhonchi.  Fair air movement.  Speaking in full sentences.  Discharge Instructions   Discharge Instructions     Diet Carb Modified   Complete by: As directed    Increase activity slowly   Complete by: As directed       Allergies as of 11/24/2022       Reactions   Iodinated Contrast Media Hives,  Itching   Penicillins Other (See Comments)   Unknown reaction in childhood        Medication List     STOP taking these medications    HYDROcodone-acetaminophen 5-325 MG tablet Commonly known as: NORCO/VICODIN       TAKE these medications    acetaminophen 325 MG tablet Commonly known as: TYLENOL Take 650 mg by mouth as needed.   apixaban 2.5 MG Tabs tablet Commonly known as: Eliquis Take 1 tablet (2.5 mg total) by mouth 2 (two) times daily.   aspirin 81 MG tablet Take 81 mg by mouth daily.   atorvastatin 10 MG tablet Commonly known as: LIPITOR Take 10 mg by mouth daily.   feeding supplement Liqd Take 237 mLs by mouth 2 (two) times daily between meals.   fluvoxaMINE 100 MG tablet Commonly known as: LUVOX Take 200 mg by mouth daily.   IBU 800 MG tablet Generic drug: ibuprofen Take 800 mg by mouth every 8 (eight) hours as needed for headache or moderate pain.   isosorbide mononitrate 30 MG 24 hr tablet Commonly known as: IMDUR TAKE 1 TABLET BY MOUTH EVERY DAY   Jardiance 25 MG Tabs tablet Generic drug: empagliflozin Take 25 mg by mouth daily.   levocetirizine 5 MG tablet Commonly known as:  XYZAL Take 5 mg by mouth daily.   methocarbamol 500 MG tablet Commonly known as: ROBAXIN Take 1 tablet (500 mg total) by mouth every 6 (six) hours as needed for muscle spasms.   metoprolol succinate 50 MG 24 hr tablet Commonly known as: TOPROL-XL TAKE 1 TABLET BY MOUTH EVERY MORNING AND TAKE 1/2 TABLET EVERY EVENING - NEEDS APPOINTMENT What changed:  how much to take how to take this when to take this additional instructions   multivitamin with minerals Tabs tablet Take 1 tablet by mouth daily. Start taking on: November 25, 2022   NovoLIN N 100 UNIT/ML injection Generic drug: insulin NPH Human Inject 10-25 Units into the skin See admin instructions. Inject 25 units with lunch, 10 units at bedtime.   oxyCODONE 5 MG immediate release tablet Commonly known as:  Oxy IR/ROXICODONE Take 1 tablet (5 mg total) by mouth every 4 (four) hours as needed for breakthrough pain or severe pain.   Ozempic (1 MG/DOSE) 4 MG/3ML Sopn Generic drug: Semaglutide (1 MG/DOSE) Inject 1 mg into the skin every Monday.   pantoprazole 40 MG tablet Commonly known as: PROTONIX Take 40 mg by mouth daily.   polyethylene glycol 17 g packet Commonly known as: MIRALAX / GLYCOLAX Take 17 g by mouth 2 (two) times daily.   senna-docusate 8.6-50 MG tablet Commonly known as: Senokot-S Take 1 tablet by mouth 2 (two) times daily.   tamsulosin 0.4 MG Caps capsule Commonly known as: FLOMAX Take 1 capsule (0.4 mg total) by mouth daily after supper.   traZODone 150 MG tablet Commonly known as: DESYREL Take 150 mg by mouth at bedtime.   Tresiba 100 UNIT/ML Soln Generic drug: Insulin Degludec Inject 20 Units into the skin at bedtime.   Vitamin D (Ergocalciferol) 1.25 MG (50000 UNIT) Caps capsule Commonly known as: DRISDOL Take 1 capsule (50,000 Units total) by mouth every 7 (seven) days. Start taking on: November 28, 2022       Allergies  Allergen Reactions   Iodinated Contrast Media Hives and Itching   Penicillins Other (See Comments)    Unknown reaction in childhood    Follow-up Information     MD AT SNF Follow up.          Haddix, Gillie Manners, MD. Schedule an appointment as soon as possible for a visit in 2 week(s).   Specialty: Orthopedic Surgery Contact information: [address] [place] [address] [phone]                  The results of significant diagnostics from this hospitalization (including imaging, microbiology, ancillary and laboratory) are listed below for reference.    Significant Diagnostic Studies: DG FEMUR, MIN 2 VIEWS RIGHT  Result Date: 11/20/2022 CLINICAL DATA:  Postop fracture EXAM: RIGHT FEMUR 2 VIEWS COMPARISON:  11/19/2022 FINDINGS: Vascular calcifications. Previous knee replacement. Interval surgical plate and screw  fixation of the mid to distal femur across comminuted distal femoral fracture with anatomic alignment. Gas in the soft tissues consistent with recent surgery IMPRESSION: Interval surgical fixation of distal femoral fracture with expected postsurgical change Electronically Signed   By: Jasmine Pang M.D.   On: 11/20/2022 15:29   DG FEMUR, MIN 2 VIEWS RIGHT  Result Date: 11/20/2022 CLINICAL DATA:  Fluoroscopic assistance for internal fixation of fracture of distal right femur. EXAM: RIGHT FEMUR 2 VIEWS COMPARISON:  11/20/2022 FINDINGS: Fluoroscopic images show reduction and internal fixation of comminuted fracture of distal shaft of right femur. There is previous right knee  arthroplasty. A metallic plate and multiple screws are used for internal fixation. Fluoroscopic time 67 seconds. Radiation dose 5.48 mGy. IMPRESSION: Fluoroscopic assistance was provided for reduction and internal fixation of comminuted fracture of distal shaft of right femur. Electronically Signed   By: Ernie Avena M.D.   On: 11/20/2022 14:22   DG C-Arm 1-60 Min-No Report  Result Date: 11/20/2022 Fluoroscopy was utilized by the requesting physician.  No radiographic interpretation.   ECHOCARDIOGRAM COMPLETE  Result Date: 11/20/2022    ECHOCARDIOGRAM REPORT   Patient Name:   Michael Rose Murton Date of Exam: 11/20/2022 Medical Rec #:  [id number]      Height:       71.0 in Accession #:    [id number]     Weight:       229.0 lb Date of Birth:  [birth year]       BSA:          2.233 m Patient Age:    72 years       BP:           98/62 mmHg Patient Gender: M              HR:           90 bpm. Exam Location:  Inpatient Procedure: 2D Echo, Cardiac Doppler and Color Doppler Indications:    Preoperative evaluation  History:        Patient has no prior history of Echocardiogram examinations.                 Risk Factors:Hypertension and Dyslipidemia.  Sonographer:    Lucendia Herrlich Referring Phys: [id number] PING T ZHANG IMPRESSIONS  1. Left  ventricular ejection fraction, by estimation, is 55 to 60%. The left ventricle has normal function. The left ventricle has no regional wall motion abnormalities. There is mild left ventricular hypertrophy. Left ventricular diastolic parameters are consistent with Grade I diastolic dysfunction (impaired relaxation).  2. Right ventricular systolic function is normal. The right ventricular size is normal. Tricuspid regurgitation signal is inadequate for assessing PA pressure.  3. The mitral valve is normal in structure. Trivial mitral valve regurgitation. No evidence of mitral stenosis.  4. The aortic valve is tricuspid. There is mild calcification of the aortic valve. There is mild thickening of the aortic valve. Aortic valve regurgitation is not visualized. No aortic stenosis is present.  5. The inferior vena cava is normal in size with greater than 50% respiratory variability, suggesting right atrial pressure of 3 mmHg. FINDINGS  Left Ventricle: Left ventricular ejection fraction, by estimation, is 55 to 60%. The left ventricle has normal function. The left ventricle has no regional wall motion abnormalities. The left ventricular internal cavity size was normal in size. There is  mild left ventricular hypertrophy. Left ventricular diastolic parameters are consistent with Grade I diastolic dysfunction (impaired relaxation). Right Ventricle: The right ventricular size is normal. No increase in right ventricular wall thickness. Right ventricular systolic function is normal. Tricuspid regurgitation signal is inadequate for assessing PA pressure. Left Atrium: Left atrial size was normal in size. Right Atrium: Right atrial size was normal in size. Pericardium: There is no evidence of pericardial effusion. Mitral Valve: The mitral valve is normal in structure. Trivial mitral valve regurgitation. No evidence of mitral valve stenosis. Tricuspid Valve: The tricuspid valve is normal in structure. Tricuspid valve regurgitation  is trivial. No evidence of tricuspid stenosis. Aortic Valve: The aortic valve is tricuspid. There is mild calcification of the aortic valve.  There is mild thickening of the aortic valve. Aortic valve regurgitation is not visualized. No aortic stenosis is present. Aortic valve peak gradient measures 10.1 mmHg. Pulmonic Valve: The pulmonic valve was normal in structure. Pulmonic valve regurgitation is not visualized. No evidence of pulmonic stenosis. Aorta: The aortic root is normal in size and structure. Venous: The inferior vena cava is normal in size with greater than 50% respiratory variability, suggesting right atrial pressure of 3 mmHg. IAS/Shunts: No atrial level shunt detected by color flow Doppler.  LEFT VENTRICLE PLAX 2D LVIDd:         4.60 cm   Diastology LVIDs:         3.40 cm   LV e' medial:    7.30 cm/s LV PW:         1.30 cm   LV E/e' medial:  6.7 LV IVS:        1.10 cm   LV e' lateral:   6.22 cm/s LVOT diam:     2.20 cm   LV E/e' lateral: 7.9 LV SV:         49 LV SV Index:   22 LVOT Area:     3.80 cm  RIGHT VENTRICLE             IVC RV S prime:     14.90 cm/s  IVC diam: 1.90 cm TAPSE (M-mode): 1.8 cm LEFT ATRIUM             Index LA diam:        3.60 cm 1.61 cm/m LA Vol (A2C):   43.0 ml 19.25 ml/m LA Vol (A4C):   39.7 ml 17.78 ml/m LA Biplane Vol: 44.7 ml 20.01 ml/m  AORTIC VALVE AV Area (Vmax): 2.05 cm AV Vmax:        159.00 cm/s AV Peak Grad:   10.1 mmHg LVOT Vmax:      85.70 cm/s LVOT Vmean:     53.900 cm/s LVOT VTI:       0.130 m  AORTA Ao Root diam: 3.60 cm Ao Asc diam:  3.10 cm MITRAL VALVE MV Area (PHT): 4.06 cm    SHUNTS MV Decel Time: 187 msec    Systemic VTI:  0.13 m MV E velocity: 49.00 cm/s  Systemic Diam: 2.20 cm MV A velocity: 80.80 cm/s MV E/A ratio:  0.61 Weston Brass MD Electronically signed by Weston Brass MD Signature Date/Time: 11/20/2022/1:17:25 PM    Final    DG FEMUR PORT, MIN 2 VIEWS RIGHT  Result Date: 11/20/2022 CLINICAL DATA:  Preop evaluation EXAM: RIGHT  FEMUR PORTABLE 2 VIEW COMPARISON:  11/19/2022 FINDINGS: Comminuted distal femoral fracture is again seen on the right just above the knee prosthesis. Approximately 1/2 bone with displacement of the distal fracture fragment is noted with respect to the proximal femoral shaft. No other fracture is seen. IMPRESSION: Comminuted distal right femoral fracture. Electronically Signed   By: Alcide Clever M.D.   On: 11/20/2022 09:50   Chest Portable 1 View  Result Date: 11/19/2022 CLINICAL DATA:  Preop for femoral fracture. EXAM: PORTABLE CHEST 1 VIEW COMPARISON:  Oct 13, 2012. FINDINGS: Stable cardiomediastinal silhouette. Left lung is clear. Minimal right basilar subsegmental atelectasis is noted with small right pleural effusion. Bony thorax is unremarkable. IMPRESSION: Minimal right basilar subsegmental atelectasis is noted with small right pleural effusion. Electronically Signed   By: Lupita Raider M.D.   On: 11/19/2022 18:53   DG Knee 2 Views Right  Result Date: 11/19/2022 CLINICAL  DATA:  Trauma, fall, pain EXAM: RIGHT KNEE - 1-2 VIEW COMPARISON:  None Available. FINDINGS: There is previous right knee arthroplasty. There is severely comminuted fracture in the distal shaft of femur. There is 10 mm offset in alignment of fracture fragments along the lateral aspect. There is overriding of fracture fragments. Arterial calcifications are seen in soft tissues. IMPRESSION: Previous right knee arthroplasty. Recent comminuted displaced fracture is seen in the distal shaft of right femur. Arteriosclerosis. Electronically Signed   By: Ernie Avena M.D.   On: 11/19/2022 16:47   DG Knee Complete 4 Views Left  Result Date: 11/19/2022 CLINICAL DATA:  Fall.  Knee replacement 8 years ago. EXAM: LEFT KNEE - COMPLETE 4+ VIEW COMPARISON:  Radiographs dated March 16, 2008 FINDINGS: Status post left knee arthroplasty with intact hardware. No periprosthetic loosening or fracture. Prominent vascular calcification.  Heterotopic ossification about the superior aspect of the patella. IMPRESSION: Status post left knee arthroplasty with intact hardware. No periprosthetic loosening or fracture. Electronically Signed   By: Larose Hires D.O.   On: 11/19/2022 12:12    Microbiology: No results found for this or any previous visit (from the past 240 hour(s)).   Labs: Basic Metabolic Panel: Recent Labs  Lab 11/19/22 1535 11/21/22 0441 11/22/22 0232 11/23/22 0726 11/24/22 0207  NA 140 139 140 138 137  K 3.6 4.0 3.7 3.6 4.0  CL 104 107 107 106 105  CO2 23 23 26 23 23   GLUCOSE 113* 181* 178* 169* 167*  BUN 19 23 21 17 19   CREATININE 0.86 0.94 0.80 0.84 0.99  CALCIUM 8.7* 8.3* 8.3* 8.2* 8.3*  MG  --  2.1  --   --   --    Liver Function Tests: No results for input(s): "AST", "ALT", "ALKPHOS", "BILITOT", "PROT", "ALBUMIN" in the last 168 hours. No results for input(s): "LIPASE", "AMYLASE" in the last 168 hours. No results for input(s): "AMMONIA" in the last 168 hours. CBC: Recent Labs  Lab 11/19/22 1535 11/20/22 0625 11/21/22 0441 11/22/22 0232 11/23/22 0726 11/24/22 0207  WBC 12.1* 7.0 11.7* 10.2 8.0 9.0  NEUTROABS 9.6*  --   --   --   --   --   HGB 14.5 12.6* 11.6* 10.7* 11.4* 11.4*  HCT 44.7 39.3 35.9* 32.1* 35.0* 35.6*  MCV 79.8* 80.0 80.5 79.5* 78.7* 79.8*  PLT 184 175 167 171 200 227   Cardiac Enzymes: No results for input(s): "CKTOTAL", "CKMB", "CKMBINDEX", "TROPONINI" in the last 168 hours. BNP: BNP (last 3 results) No results for input(s): "BNP" in the last 8760 hours.  ProBNP (last 3 results) No results for input(s): "PROBNP" in the last 8760 hours.  CBG: Recent Labs  Lab 11/23/22 0711 11/23/22 1114 11/23/22 1612 11/23/22 2125 11/24/22 0717  GLUCAP 146* 224* 257* 165* 155*       Signed:  Ramiro Harvest MD.  [facility] 11/24/2022, 12:52 PM

## 2022-11-25 DIAGNOSIS — N400 Enlarged prostate without lower urinary tract symptoms: Secondary | ICD-10-CM | POA: Diagnosis not present

## 2022-11-25 DIAGNOSIS — E559 Vitamin D deficiency, unspecified: Secondary | ICD-10-CM | POA: Diagnosis not present

## 2022-11-25 DIAGNOSIS — K219 Gastro-esophageal reflux disease without esophagitis: Secondary | ICD-10-CM | POA: Diagnosis not present

## 2022-11-25 DIAGNOSIS — F419 Anxiety disorder, unspecified: Secondary | ICD-10-CM | POA: Diagnosis not present

## 2022-11-25 DIAGNOSIS — I2510 Atherosclerotic heart disease of native coronary artery without angina pectoris: Secondary | ICD-10-CM | POA: Diagnosis not present

## 2022-11-25 DIAGNOSIS — E119 Type 2 diabetes mellitus without complications: Secondary | ICD-10-CM | POA: Diagnosis not present

## 2022-11-25 DIAGNOSIS — F319 Bipolar disorder, unspecified: Secondary | ICD-10-CM | POA: Diagnosis not present

## 2022-11-25 DIAGNOSIS — E785 Hyperlipidemia, unspecified: Secondary | ICD-10-CM | POA: Diagnosis not present

## 2022-11-29 DIAGNOSIS — E119 Type 2 diabetes mellitus without complications: Secondary | ICD-10-CM | POA: Diagnosis not present

## 2022-11-30 DIAGNOSIS — R52 Pain, unspecified: Secondary | ICD-10-CM | POA: Diagnosis not present

## 2022-11-30 DIAGNOSIS — I2510 Atherosclerotic heart disease of native coronary artery without angina pectoris: Secondary | ICD-10-CM | POA: Diagnosis not present

## 2022-11-30 DIAGNOSIS — E119 Type 2 diabetes mellitus without complications: Secondary | ICD-10-CM | POA: Diagnosis not present

## 2022-11-30 DIAGNOSIS — I10 Essential (primary) hypertension: Secondary | ICD-10-CM | POA: Diagnosis not present

## 2022-12-01 DIAGNOSIS — R5381 Other malaise: Secondary | ICD-10-CM | POA: Diagnosis not present

## 2022-12-01 DIAGNOSIS — S72401A Unspecified fracture of lower end of right femur, initial encounter for closed fracture: Secondary | ICD-10-CM | POA: Diagnosis not present

## 2022-12-08 DIAGNOSIS — S72451D Displaced supracondylar fracture without intracondylar extension of lower end of right femur, subsequent encounter for closed fracture with routine healing: Secondary | ICD-10-CM | POA: Diagnosis not present

## 2022-12-09 DIAGNOSIS — N400 Enlarged prostate without lower urinary tract symptoms: Secondary | ICD-10-CM | POA: Diagnosis not present

## 2022-12-09 DIAGNOSIS — I2510 Atherosclerotic heart disease of native coronary artery without angina pectoris: Secondary | ICD-10-CM | POA: Diagnosis not present

## 2022-12-09 DIAGNOSIS — F419 Anxiety disorder, unspecified: Secondary | ICD-10-CM | POA: Diagnosis not present

## 2022-12-09 DIAGNOSIS — F319 Bipolar disorder, unspecified: Secondary | ICD-10-CM | POA: Diagnosis not present

## 2022-12-09 DIAGNOSIS — E559 Vitamin D deficiency, unspecified: Secondary | ICD-10-CM | POA: Diagnosis not present

## 2022-12-09 DIAGNOSIS — E119 Type 2 diabetes mellitus without complications: Secondary | ICD-10-CM | POA: Diagnosis not present

## 2022-12-09 DIAGNOSIS — E785 Hyperlipidemia, unspecified: Secondary | ICD-10-CM | POA: Diagnosis not present

## 2022-12-09 DIAGNOSIS — K219 Gastro-esophageal reflux disease without esophagitis: Secondary | ICD-10-CM | POA: Diagnosis not present

## 2022-12-10 DIAGNOSIS — S72401A Unspecified fracture of lower end of right femur, initial encounter for closed fracture: Secondary | ICD-10-CM | POA: Diagnosis not present

## 2022-12-10 DIAGNOSIS — R5381 Other malaise: Secondary | ICD-10-CM | POA: Diagnosis not present

## 2022-12-10 DIAGNOSIS — M9711XD Periprosthetic fracture around internal prosthetic right knee joint, subsequent encounter: Secondary | ICD-10-CM | POA: Diagnosis not present

## 2022-12-10 DIAGNOSIS — W19XXXA Unspecified fall, initial encounter: Secondary | ICD-10-CM | POA: Diagnosis not present

## 2022-12-14 DIAGNOSIS — E559 Vitamin D deficiency, unspecified: Secondary | ICD-10-CM | POA: Diagnosis not present

## 2022-12-14 DIAGNOSIS — K219 Gastro-esophageal reflux disease without esophagitis: Secondary | ICD-10-CM | POA: Diagnosis not present

## 2022-12-14 DIAGNOSIS — I2510 Atherosclerotic heart disease of native coronary artery without angina pectoris: Secondary | ICD-10-CM | POA: Diagnosis not present

## 2022-12-14 DIAGNOSIS — N400 Enlarged prostate without lower urinary tract symptoms: Secondary | ICD-10-CM | POA: Diagnosis not present

## 2022-12-14 DIAGNOSIS — F319 Bipolar disorder, unspecified: Secondary | ICD-10-CM | POA: Diagnosis not present

## 2022-12-14 DIAGNOSIS — F419 Anxiety disorder, unspecified: Secondary | ICD-10-CM | POA: Diagnosis not present

## 2022-12-14 DIAGNOSIS — E119 Type 2 diabetes mellitus without complications: Secondary | ICD-10-CM | POA: Diagnosis not present

## 2022-12-14 DIAGNOSIS — E785 Hyperlipidemia, unspecified: Secondary | ICD-10-CM | POA: Diagnosis not present

## 2022-12-15 DIAGNOSIS — F319 Bipolar disorder, unspecified: Secondary | ICD-10-CM | POA: Diagnosis not present

## 2022-12-15 DIAGNOSIS — E782 Mixed hyperlipidemia: Secondary | ICD-10-CM | POA: Diagnosis not present

## 2022-12-15 DIAGNOSIS — I2510 Atherosclerotic heart disease of native coronary artery without angina pectoris: Secondary | ICD-10-CM | POA: Diagnosis not present

## 2022-12-15 DIAGNOSIS — I252 Old myocardial infarction: Secondary | ICD-10-CM | POA: Diagnosis not present

## 2022-12-15 DIAGNOSIS — M9711XD Periprosthetic fracture around internal prosthetic right knee joint, subsequent encounter: Secondary | ICD-10-CM | POA: Diagnosis not present

## 2022-12-15 DIAGNOSIS — E119 Type 2 diabetes mellitus without complications: Secondary | ICD-10-CM | POA: Diagnosis not present

## 2022-12-15 DIAGNOSIS — I10 Essential (primary) hypertension: Secondary | ICD-10-CM | POA: Diagnosis not present

## 2022-12-15 DIAGNOSIS — S72401D Unspecified fracture of lower end of right femur, subsequent encounter for closed fracture with routine healing: Secondary | ICD-10-CM | POA: Diagnosis not present

## 2022-12-17 DIAGNOSIS — E1165 Type 2 diabetes mellitus with hyperglycemia: Secondary | ICD-10-CM | POA: Diagnosis not present

## 2022-12-17 DIAGNOSIS — M9711XD Periprosthetic fracture around internal prosthetic right knee joint, subsequent encounter: Secondary | ICD-10-CM | POA: Diagnosis not present

## 2022-12-17 DIAGNOSIS — I10 Essential (primary) hypertension: Secondary | ICD-10-CM | POA: Diagnosis not present

## 2022-12-17 DIAGNOSIS — S7290XA Unspecified fracture of unspecified femur, initial encounter for closed fracture: Secondary | ICD-10-CM | POA: Diagnosis not present

## 2022-12-18 DIAGNOSIS — E119 Type 2 diabetes mellitus without complications: Secondary | ICD-10-CM | POA: Diagnosis not present

## 2022-12-18 DIAGNOSIS — I2510 Atherosclerotic heart disease of native coronary artery without angina pectoris: Secondary | ICD-10-CM | POA: Diagnosis not present

## 2022-12-18 DIAGNOSIS — I252 Old myocardial infarction: Secondary | ICD-10-CM | POA: Diagnosis not present

## 2022-12-18 DIAGNOSIS — S72401D Unspecified fracture of lower end of right femur, subsequent encounter for closed fracture with routine healing: Secondary | ICD-10-CM | POA: Diagnosis not present

## 2022-12-18 DIAGNOSIS — M9711XD Periprosthetic fracture around internal prosthetic right knee joint, subsequent encounter: Secondary | ICD-10-CM | POA: Diagnosis not present

## 2022-12-18 DIAGNOSIS — E782 Mixed hyperlipidemia: Secondary | ICD-10-CM | POA: Diagnosis not present

## 2022-12-18 DIAGNOSIS — F319 Bipolar disorder, unspecified: Secondary | ICD-10-CM | POA: Diagnosis not present

## 2022-12-18 DIAGNOSIS — I10 Essential (primary) hypertension: Secondary | ICD-10-CM | POA: Diagnosis not present

## 2022-12-23 DIAGNOSIS — S72401D Unspecified fracture of lower end of right femur, subsequent encounter for closed fracture with routine healing: Secondary | ICD-10-CM | POA: Diagnosis not present

## 2022-12-23 DIAGNOSIS — I252 Old myocardial infarction: Secondary | ICD-10-CM | POA: Diagnosis not present

## 2022-12-23 DIAGNOSIS — I10 Essential (primary) hypertension: Secondary | ICD-10-CM | POA: Diagnosis not present

## 2022-12-23 DIAGNOSIS — E782 Mixed hyperlipidemia: Secondary | ICD-10-CM | POA: Diagnosis not present

## 2022-12-23 DIAGNOSIS — I2510 Atherosclerotic heart disease of native coronary artery without angina pectoris: Secondary | ICD-10-CM | POA: Diagnosis not present

## 2022-12-23 DIAGNOSIS — E119 Type 2 diabetes mellitus without complications: Secondary | ICD-10-CM | POA: Diagnosis not present

## 2022-12-23 DIAGNOSIS — M9711XD Periprosthetic fracture around internal prosthetic right knee joint, subsequent encounter: Secondary | ICD-10-CM | POA: Diagnosis not present

## 2022-12-23 DIAGNOSIS — F319 Bipolar disorder, unspecified: Secondary | ICD-10-CM | POA: Diagnosis not present

## 2022-12-25 DIAGNOSIS — S72401D Unspecified fracture of lower end of right femur, subsequent encounter for closed fracture with routine healing: Secondary | ICD-10-CM | POA: Diagnosis not present

## 2022-12-25 DIAGNOSIS — M9711XD Periprosthetic fracture around internal prosthetic right knee joint, subsequent encounter: Secondary | ICD-10-CM | POA: Diagnosis not present

## 2022-12-25 DIAGNOSIS — I252 Old myocardial infarction: Secondary | ICD-10-CM | POA: Diagnosis not present

## 2022-12-25 DIAGNOSIS — F319 Bipolar disorder, unspecified: Secondary | ICD-10-CM | POA: Diagnosis not present

## 2022-12-25 DIAGNOSIS — E119 Type 2 diabetes mellitus without complications: Secondary | ICD-10-CM | POA: Diagnosis not present

## 2022-12-25 DIAGNOSIS — E782 Mixed hyperlipidemia: Secondary | ICD-10-CM | POA: Diagnosis not present

## 2022-12-25 DIAGNOSIS — I10 Essential (primary) hypertension: Secondary | ICD-10-CM | POA: Diagnosis not present

## 2022-12-25 DIAGNOSIS — I2510 Atherosclerotic heart disease of native coronary artery without angina pectoris: Secondary | ICD-10-CM | POA: Diagnosis not present

## 2022-12-27 DIAGNOSIS — S72401D Unspecified fracture of lower end of right femur, subsequent encounter for closed fracture with routine healing: Secondary | ICD-10-CM | POA: Diagnosis not present

## 2022-12-27 DIAGNOSIS — M9711XD Periprosthetic fracture around internal prosthetic right knee joint, subsequent encounter: Secondary | ICD-10-CM | POA: Diagnosis not present

## 2022-12-27 DIAGNOSIS — E119 Type 2 diabetes mellitus without complications: Secondary | ICD-10-CM | POA: Diagnosis not present

## 2022-12-29 DIAGNOSIS — S72401D Unspecified fracture of lower end of right femur, subsequent encounter for closed fracture with routine healing: Secondary | ICD-10-CM | POA: Diagnosis not present

## 2022-12-29 DIAGNOSIS — F319 Bipolar disorder, unspecified: Secondary | ICD-10-CM | POA: Diagnosis not present

## 2022-12-29 DIAGNOSIS — I10 Essential (primary) hypertension: Secondary | ICD-10-CM | POA: Diagnosis not present

## 2022-12-29 DIAGNOSIS — E119 Type 2 diabetes mellitus without complications: Secondary | ICD-10-CM | POA: Diagnosis not present

## 2022-12-29 DIAGNOSIS — I252 Old myocardial infarction: Secondary | ICD-10-CM | POA: Diagnosis not present

## 2022-12-29 DIAGNOSIS — E782 Mixed hyperlipidemia: Secondary | ICD-10-CM | POA: Diagnosis not present

## 2022-12-29 DIAGNOSIS — M9711XD Periprosthetic fracture around internal prosthetic right knee joint, subsequent encounter: Secondary | ICD-10-CM | POA: Diagnosis not present

## 2022-12-29 DIAGNOSIS — I2510 Atherosclerotic heart disease of native coronary artery without angina pectoris: Secondary | ICD-10-CM | POA: Diagnosis not present

## 2022-12-30 DIAGNOSIS — E119 Type 2 diabetes mellitus without complications: Secondary | ICD-10-CM | POA: Diagnosis not present

## 2022-12-31 DIAGNOSIS — F319 Bipolar disorder, unspecified: Secondary | ICD-10-CM | POA: Diagnosis not present

## 2022-12-31 DIAGNOSIS — I2510 Atherosclerotic heart disease of native coronary artery without angina pectoris: Secondary | ICD-10-CM | POA: Diagnosis not present

## 2022-12-31 DIAGNOSIS — E782 Mixed hyperlipidemia: Secondary | ICD-10-CM | POA: Diagnosis not present

## 2022-12-31 DIAGNOSIS — I10 Essential (primary) hypertension: Secondary | ICD-10-CM | POA: Diagnosis not present

## 2022-12-31 DIAGNOSIS — M9711XD Periprosthetic fracture around internal prosthetic right knee joint, subsequent encounter: Secondary | ICD-10-CM | POA: Diagnosis not present

## 2022-12-31 DIAGNOSIS — S72401D Unspecified fracture of lower end of right femur, subsequent encounter for closed fracture with routine healing: Secondary | ICD-10-CM | POA: Diagnosis not present

## 2022-12-31 DIAGNOSIS — I252 Old myocardial infarction: Secondary | ICD-10-CM | POA: Diagnosis not present

## 2022-12-31 DIAGNOSIS — E119 Type 2 diabetes mellitus without complications: Secondary | ICD-10-CM | POA: Diagnosis not present

## 2023-01-01 DIAGNOSIS — E119 Type 2 diabetes mellitus without complications: Secondary | ICD-10-CM | POA: Diagnosis not present

## 2023-01-01 DIAGNOSIS — S72401D Unspecified fracture of lower end of right femur, subsequent encounter for closed fracture with routine healing: Secondary | ICD-10-CM | POA: Diagnosis not present

## 2023-01-01 DIAGNOSIS — M9711XD Periprosthetic fracture around internal prosthetic right knee joint, subsequent encounter: Secondary | ICD-10-CM | POA: Diagnosis not present

## 2023-01-01 DIAGNOSIS — I252 Old myocardial infarction: Secondary | ICD-10-CM | POA: Diagnosis not present

## 2023-01-01 DIAGNOSIS — I2510 Atherosclerotic heart disease of native coronary artery without angina pectoris: Secondary | ICD-10-CM | POA: Diagnosis not present

## 2023-01-01 DIAGNOSIS — E782 Mixed hyperlipidemia: Secondary | ICD-10-CM | POA: Diagnosis not present

## 2023-01-01 DIAGNOSIS — I10 Essential (primary) hypertension: Secondary | ICD-10-CM | POA: Diagnosis not present

## 2023-01-01 DIAGNOSIS — F319 Bipolar disorder, unspecified: Secondary | ICD-10-CM | POA: Diagnosis not present

## 2023-01-05 DIAGNOSIS — I252 Old myocardial infarction: Secondary | ICD-10-CM | POA: Diagnosis not present

## 2023-01-05 DIAGNOSIS — M9711XD Periprosthetic fracture around internal prosthetic right knee joint, subsequent encounter: Secondary | ICD-10-CM | POA: Diagnosis not present

## 2023-01-05 DIAGNOSIS — I2510 Atherosclerotic heart disease of native coronary artery without angina pectoris: Secondary | ICD-10-CM | POA: Diagnosis not present

## 2023-01-05 DIAGNOSIS — E782 Mixed hyperlipidemia: Secondary | ICD-10-CM | POA: Diagnosis not present

## 2023-01-05 DIAGNOSIS — E119 Type 2 diabetes mellitus without complications: Secondary | ICD-10-CM | POA: Diagnosis not present

## 2023-01-05 DIAGNOSIS — F319 Bipolar disorder, unspecified: Secondary | ICD-10-CM | POA: Diagnosis not present

## 2023-01-05 DIAGNOSIS — S72401D Unspecified fracture of lower end of right femur, subsequent encounter for closed fracture with routine healing: Secondary | ICD-10-CM | POA: Diagnosis not present

## 2023-01-05 DIAGNOSIS — I10 Essential (primary) hypertension: Secondary | ICD-10-CM | POA: Diagnosis not present

## 2023-01-06 DIAGNOSIS — I10 Essential (primary) hypertension: Secondary | ICD-10-CM | POA: Diagnosis not present

## 2023-01-06 DIAGNOSIS — E1165 Type 2 diabetes mellitus with hyperglycemia: Secondary | ICD-10-CM | POA: Diagnosis not present

## 2023-01-06 DIAGNOSIS — S7290XA Unspecified fracture of unspecified femur, initial encounter for closed fracture: Secondary | ICD-10-CM | POA: Diagnosis not present

## 2023-01-07 DIAGNOSIS — I10 Essential (primary) hypertension: Secondary | ICD-10-CM | POA: Diagnosis not present

## 2023-01-07 DIAGNOSIS — I252 Old myocardial infarction: Secondary | ICD-10-CM | POA: Diagnosis not present

## 2023-01-07 DIAGNOSIS — M9711XD Periprosthetic fracture around internal prosthetic right knee joint, subsequent encounter: Secondary | ICD-10-CM | POA: Diagnosis not present

## 2023-01-07 DIAGNOSIS — S72401D Unspecified fracture of lower end of right femur, subsequent encounter for closed fracture with routine healing: Secondary | ICD-10-CM | POA: Diagnosis not present

## 2023-01-07 DIAGNOSIS — F319 Bipolar disorder, unspecified: Secondary | ICD-10-CM | POA: Diagnosis not present

## 2023-01-07 DIAGNOSIS — I2510 Atherosclerotic heart disease of native coronary artery without angina pectoris: Secondary | ICD-10-CM | POA: Diagnosis not present

## 2023-01-07 DIAGNOSIS — E119 Type 2 diabetes mellitus without complications: Secondary | ICD-10-CM | POA: Diagnosis not present

## 2023-01-07 DIAGNOSIS — E782 Mixed hyperlipidemia: Secondary | ICD-10-CM | POA: Diagnosis not present

## 2023-01-11 DIAGNOSIS — M6281 Muscle weakness (generalized): Secondary | ICD-10-CM | POA: Diagnosis not present

## 2023-01-13 DIAGNOSIS — I10 Essential (primary) hypertension: Secondary | ICD-10-CM | POA: Diagnosis not present

## 2023-01-13 DIAGNOSIS — E1165 Type 2 diabetes mellitus with hyperglycemia: Secondary | ICD-10-CM | POA: Diagnosis not present

## 2023-01-13 DIAGNOSIS — S7290XA Unspecified fracture of unspecified femur, initial encounter for closed fracture: Secondary | ICD-10-CM | POA: Diagnosis not present

## 2023-01-14 DIAGNOSIS — M9711XD Periprosthetic fracture around internal prosthetic right knee joint, subsequent encounter: Secondary | ICD-10-CM | POA: Diagnosis not present

## 2023-01-14 DIAGNOSIS — E782 Mixed hyperlipidemia: Secondary | ICD-10-CM | POA: Diagnosis not present

## 2023-01-14 DIAGNOSIS — I2510 Atherosclerotic heart disease of native coronary artery without angina pectoris: Secondary | ICD-10-CM | POA: Diagnosis not present

## 2023-01-14 DIAGNOSIS — S72401D Unspecified fracture of lower end of right femur, subsequent encounter for closed fracture with routine healing: Secondary | ICD-10-CM | POA: Diagnosis not present

## 2023-01-14 DIAGNOSIS — E119 Type 2 diabetes mellitus without complications: Secondary | ICD-10-CM | POA: Diagnosis not present

## 2023-01-14 DIAGNOSIS — I10 Essential (primary) hypertension: Secondary | ICD-10-CM | POA: Diagnosis not present

## 2023-01-14 DIAGNOSIS — F319 Bipolar disorder, unspecified: Secondary | ICD-10-CM | POA: Diagnosis not present

## 2023-01-14 DIAGNOSIS — I252 Old myocardial infarction: Secondary | ICD-10-CM | POA: Diagnosis not present

## 2023-01-15 DIAGNOSIS — E782 Mixed hyperlipidemia: Secondary | ICD-10-CM | POA: Diagnosis not present

## 2023-01-15 DIAGNOSIS — F319 Bipolar disorder, unspecified: Secondary | ICD-10-CM | POA: Diagnosis not present

## 2023-01-15 DIAGNOSIS — M9711XD Periprosthetic fracture around internal prosthetic right knee joint, subsequent encounter: Secondary | ICD-10-CM | POA: Diagnosis not present

## 2023-01-15 DIAGNOSIS — I252 Old myocardial infarction: Secondary | ICD-10-CM | POA: Diagnosis not present

## 2023-01-15 DIAGNOSIS — S72401D Unspecified fracture of lower end of right femur, subsequent encounter for closed fracture with routine healing: Secondary | ICD-10-CM | POA: Diagnosis not present

## 2023-01-15 DIAGNOSIS — I2510 Atherosclerotic heart disease of native coronary artery without angina pectoris: Secondary | ICD-10-CM | POA: Diagnosis not present

## 2023-01-15 DIAGNOSIS — I10 Essential (primary) hypertension: Secondary | ICD-10-CM | POA: Diagnosis not present

## 2023-01-15 DIAGNOSIS — E119 Type 2 diabetes mellitus without complications: Secondary | ICD-10-CM | POA: Diagnosis not present

## 2023-01-18 DIAGNOSIS — F319 Bipolar disorder, unspecified: Secondary | ICD-10-CM | POA: Diagnosis not present

## 2023-01-18 DIAGNOSIS — I252 Old myocardial infarction: Secondary | ICD-10-CM | POA: Diagnosis not present

## 2023-01-18 DIAGNOSIS — E119 Type 2 diabetes mellitus without complications: Secondary | ICD-10-CM | POA: Diagnosis not present

## 2023-01-18 DIAGNOSIS — S72401D Unspecified fracture of lower end of right femur, subsequent encounter for closed fracture with routine healing: Secondary | ICD-10-CM | POA: Diagnosis not present

## 2023-01-18 DIAGNOSIS — E782 Mixed hyperlipidemia: Secondary | ICD-10-CM | POA: Diagnosis not present

## 2023-01-18 DIAGNOSIS — M9711XD Periprosthetic fracture around internal prosthetic right knee joint, subsequent encounter: Secondary | ICD-10-CM | POA: Diagnosis not present

## 2023-01-18 DIAGNOSIS — I2510 Atherosclerotic heart disease of native coronary artery without angina pectoris: Secondary | ICD-10-CM | POA: Diagnosis not present

## 2023-01-18 DIAGNOSIS — I10 Essential (primary) hypertension: Secondary | ICD-10-CM | POA: Diagnosis not present

## 2023-01-19 DIAGNOSIS — F319 Bipolar disorder, unspecified: Secondary | ICD-10-CM | POA: Diagnosis not present

## 2023-01-19 DIAGNOSIS — E782 Mixed hyperlipidemia: Secondary | ICD-10-CM | POA: Diagnosis not present

## 2023-01-19 DIAGNOSIS — I10 Essential (primary) hypertension: Secondary | ICD-10-CM | POA: Diagnosis not present

## 2023-01-19 DIAGNOSIS — S72401D Unspecified fracture of lower end of right femur, subsequent encounter for closed fracture with routine healing: Secondary | ICD-10-CM | POA: Diagnosis not present

## 2023-01-19 DIAGNOSIS — I2510 Atherosclerotic heart disease of native coronary artery without angina pectoris: Secondary | ICD-10-CM | POA: Diagnosis not present

## 2023-01-19 DIAGNOSIS — E119 Type 2 diabetes mellitus without complications: Secondary | ICD-10-CM | POA: Diagnosis not present

## 2023-01-19 DIAGNOSIS — M9711XD Periprosthetic fracture around internal prosthetic right knee joint, subsequent encounter: Secondary | ICD-10-CM | POA: Diagnosis not present

## 2023-01-19 DIAGNOSIS — I252 Old myocardial infarction: Secondary | ICD-10-CM | POA: Diagnosis not present

## 2023-01-25 DIAGNOSIS — M7989 Other specified soft tissue disorders: Secondary | ICD-10-CM | POA: Diagnosis not present

## 2023-01-25 DIAGNOSIS — E119 Type 2 diabetes mellitus without complications: Secondary | ICD-10-CM | POA: Diagnosis not present

## 2023-01-25 DIAGNOSIS — S72401D Unspecified fracture of lower end of right femur, subsequent encounter for closed fracture with routine healing: Secondary | ICD-10-CM | POA: Diagnosis not present

## 2023-01-25 DIAGNOSIS — S7290XA Unspecified fracture of unspecified femur, initial encounter for closed fracture: Secondary | ICD-10-CM | POA: Diagnosis not present

## 2023-01-25 DIAGNOSIS — E782 Mixed hyperlipidemia: Secondary | ICD-10-CM | POA: Diagnosis not present

## 2023-01-25 DIAGNOSIS — I10 Essential (primary) hypertension: Secondary | ICD-10-CM | POA: Diagnosis not present

## 2023-01-25 DIAGNOSIS — I2510 Atherosclerotic heart disease of native coronary artery without angina pectoris: Secondary | ICD-10-CM | POA: Diagnosis not present

## 2023-01-25 DIAGNOSIS — F319 Bipolar disorder, unspecified: Secondary | ICD-10-CM | POA: Diagnosis not present

## 2023-01-25 DIAGNOSIS — M9711XD Periprosthetic fracture around internal prosthetic right knee joint, subsequent encounter: Secondary | ICD-10-CM | POA: Diagnosis not present

## 2023-01-25 DIAGNOSIS — I252 Old myocardial infarction: Secondary | ICD-10-CM | POA: Diagnosis not present

## 2023-01-28 DIAGNOSIS — F319 Bipolar disorder, unspecified: Secondary | ICD-10-CM | POA: Diagnosis not present

## 2023-01-28 DIAGNOSIS — E782 Mixed hyperlipidemia: Secondary | ICD-10-CM | POA: Diagnosis not present

## 2023-01-28 DIAGNOSIS — I2510 Atherosclerotic heart disease of native coronary artery without angina pectoris: Secondary | ICD-10-CM | POA: Diagnosis not present

## 2023-01-28 DIAGNOSIS — I10 Essential (primary) hypertension: Secondary | ICD-10-CM | POA: Diagnosis not present

## 2023-01-28 DIAGNOSIS — M9711XD Periprosthetic fracture around internal prosthetic right knee joint, subsequent encounter: Secondary | ICD-10-CM | POA: Diagnosis not present

## 2023-01-28 DIAGNOSIS — I252 Old myocardial infarction: Secondary | ICD-10-CM | POA: Diagnosis not present

## 2023-01-28 DIAGNOSIS — S72401D Unspecified fracture of lower end of right femur, subsequent encounter for closed fracture with routine healing: Secondary | ICD-10-CM | POA: Diagnosis not present

## 2023-01-28 DIAGNOSIS — E119 Type 2 diabetes mellitus without complications: Secondary | ICD-10-CM | POA: Diagnosis not present

## 2023-01-30 DIAGNOSIS — E119 Type 2 diabetes mellitus without complications: Secondary | ICD-10-CM | POA: Diagnosis not present

## 2023-02-02 DIAGNOSIS — S72451D Displaced supracondylar fracture without intracondylar extension of lower end of right femur, subsequent encounter for closed fracture with routine healing: Secondary | ICD-10-CM | POA: Diagnosis not present

## 2023-02-03 DIAGNOSIS — Z8781 Personal history of (healed) traumatic fracture: Secondary | ICD-10-CM | POA: Diagnosis not present

## 2023-02-03 DIAGNOSIS — I10 Essential (primary) hypertension: Secondary | ICD-10-CM | POA: Diagnosis not present

## 2023-02-03 DIAGNOSIS — M7989 Other specified soft tissue disorders: Secondary | ICD-10-CM | POA: Diagnosis not present

## 2023-02-03 DIAGNOSIS — E1165 Type 2 diabetes mellitus with hyperglycemia: Secondary | ICD-10-CM | POA: Diagnosis not present

## 2023-02-05 DIAGNOSIS — F319 Bipolar disorder, unspecified: Secondary | ICD-10-CM | POA: Diagnosis not present

## 2023-02-05 DIAGNOSIS — I252 Old myocardial infarction: Secondary | ICD-10-CM | POA: Diagnosis not present

## 2023-02-05 DIAGNOSIS — K219 Gastro-esophageal reflux disease without esophagitis: Secondary | ICD-10-CM | POA: Diagnosis not present

## 2023-02-05 DIAGNOSIS — S72351D Displaced comminuted fracture of shaft of right femur, subsequent encounter for closed fracture with routine healing: Secondary | ICD-10-CM | POA: Diagnosis not present

## 2023-02-05 DIAGNOSIS — Z7984 Long term (current) use of oral hypoglycemic drugs: Secondary | ICD-10-CM | POA: Diagnosis not present

## 2023-02-05 DIAGNOSIS — Z794 Long term (current) use of insulin: Secondary | ICD-10-CM | POA: Diagnosis not present

## 2023-02-05 DIAGNOSIS — E782 Mixed hyperlipidemia: Secondary | ICD-10-CM | POA: Diagnosis not present

## 2023-02-05 DIAGNOSIS — F419 Anxiety disorder, unspecified: Secondary | ICD-10-CM | POA: Diagnosis not present

## 2023-02-05 DIAGNOSIS — N401 Enlarged prostate with lower urinary tract symptoms: Secondary | ICD-10-CM | POA: Diagnosis not present

## 2023-02-05 DIAGNOSIS — H9191 Unspecified hearing loss, right ear: Secondary | ICD-10-CM | POA: Diagnosis not present

## 2023-02-05 DIAGNOSIS — Z955 Presence of coronary angioplasty implant and graft: Secondary | ICD-10-CM | POA: Diagnosis not present

## 2023-02-05 DIAGNOSIS — N39498 Other specified urinary incontinence: Secondary | ICD-10-CM | POA: Diagnosis not present

## 2023-02-05 DIAGNOSIS — E559 Vitamin D deficiency, unspecified: Secondary | ICD-10-CM | POA: Diagnosis not present

## 2023-02-05 DIAGNOSIS — Z96653 Presence of artificial knee joint, bilateral: Secondary | ICD-10-CM | POA: Diagnosis not present

## 2023-02-05 DIAGNOSIS — E119 Type 2 diabetes mellitus without complications: Secondary | ICD-10-CM | POA: Diagnosis not present

## 2023-02-05 DIAGNOSIS — Z7985 Long-term (current) use of injectable non-insulin antidiabetic drugs: Secondary | ICD-10-CM | POA: Diagnosis not present

## 2023-02-05 DIAGNOSIS — Z9181 History of falling: Secondary | ICD-10-CM | POA: Diagnosis not present

## 2023-02-05 DIAGNOSIS — Z7982 Long term (current) use of aspirin: Secondary | ICD-10-CM | POA: Diagnosis not present

## 2023-02-05 DIAGNOSIS — Z6828 Body mass index (BMI) 28.0-28.9, adult: Secondary | ICD-10-CM | POA: Diagnosis not present

## 2023-02-05 DIAGNOSIS — I2510 Atherosclerotic heart disease of native coronary artery without angina pectoris: Secondary | ICD-10-CM | POA: Diagnosis not present

## 2023-02-05 DIAGNOSIS — M9711XD Periprosthetic fracture around internal prosthetic right knee joint, subsequent encounter: Secondary | ICD-10-CM | POA: Diagnosis not present

## 2023-02-05 DIAGNOSIS — I10 Essential (primary) hypertension: Secondary | ICD-10-CM | POA: Diagnosis not present

## 2023-03-01 DIAGNOSIS — E119 Type 2 diabetes mellitus without complications: Secondary | ICD-10-CM | POA: Diagnosis not present

## 2023-03-03 DIAGNOSIS — M7989 Other specified soft tissue disorders: Secondary | ICD-10-CM | POA: Diagnosis not present

## 2023-03-03 DIAGNOSIS — E1165 Type 2 diabetes mellitus with hyperglycemia: Secondary | ICD-10-CM | POA: Diagnosis not present

## 2023-03-03 DIAGNOSIS — S7290XA Unspecified fracture of unspecified femur, initial encounter for closed fracture: Secondary | ICD-10-CM | POA: Diagnosis not present

## 2023-03-04 DIAGNOSIS — E119 Type 2 diabetes mellitus without complications: Secondary | ICD-10-CM | POA: Diagnosis not present

## 2023-03-04 DIAGNOSIS — S72351D Displaced comminuted fracture of shaft of right femur, subsequent encounter for closed fracture with routine healing: Secondary | ICD-10-CM | POA: Diagnosis not present

## 2023-03-04 DIAGNOSIS — M9711XD Periprosthetic fracture around internal prosthetic right knee joint, subsequent encounter: Secondary | ICD-10-CM | POA: Diagnosis not present

## 2023-03-04 DIAGNOSIS — F319 Bipolar disorder, unspecified: Secondary | ICD-10-CM | POA: Diagnosis not present

## 2023-03-05 DIAGNOSIS — I130 Hypertensive heart and chronic kidney disease with heart failure and stage 1 through stage 4 chronic kidney disease, or unspecified chronic kidney disease: Secondary | ICD-10-CM | POA: Diagnosis not present

## 2023-03-05 DIAGNOSIS — E1169 Type 2 diabetes mellitus with other specified complication: Secondary | ICD-10-CM | POA: Diagnosis not present

## 2023-03-05 DIAGNOSIS — G8314 Monoplegia of lower limb affecting left nondominant side: Secondary | ICD-10-CM | POA: Diagnosis not present

## 2023-03-05 DIAGNOSIS — Z794 Long term (current) use of insulin: Secondary | ICD-10-CM | POA: Diagnosis not present

## 2023-03-05 DIAGNOSIS — G4700 Insomnia, unspecified: Secondary | ICD-10-CM | POA: Diagnosis not present

## 2023-03-05 DIAGNOSIS — E1122 Type 2 diabetes mellitus with diabetic chronic kidney disease: Secondary | ICD-10-CM | POA: Diagnosis not present

## 2023-03-05 DIAGNOSIS — E785 Hyperlipidemia, unspecified: Secondary | ICD-10-CM | POA: Diagnosis not present

## 2023-03-05 DIAGNOSIS — G8929 Other chronic pain: Secondary | ICD-10-CM | POA: Diagnosis not present

## 2023-03-05 DIAGNOSIS — F3342 Major depressive disorder, recurrent, in full remission: Secondary | ICD-10-CM | POA: Diagnosis not present

## 2023-03-05 DIAGNOSIS — E261 Secondary hyperaldosteronism: Secondary | ICD-10-CM | POA: Diagnosis not present

## 2023-03-05 DIAGNOSIS — I509 Heart failure, unspecified: Secondary | ICD-10-CM | POA: Diagnosis not present

## 2023-03-10 DIAGNOSIS — F319 Bipolar disorder, unspecified: Secondary | ICD-10-CM | POA: Diagnosis not present

## 2023-03-10 DIAGNOSIS — I10 Essential (primary) hypertension: Secondary | ICD-10-CM | POA: Diagnosis not present

## 2023-03-10 DIAGNOSIS — M25539 Pain in unspecified wrist: Secondary | ICD-10-CM | POA: Diagnosis not present

## 2023-03-10 DIAGNOSIS — M7989 Other specified soft tissue disorders: Secondary | ICD-10-CM | POA: Diagnosis not present

## 2023-03-15 DIAGNOSIS — M25539 Pain in unspecified wrist: Secondary | ICD-10-CM | POA: Diagnosis not present

## 2023-03-15 DIAGNOSIS — E1165 Type 2 diabetes mellitus with hyperglycemia: Secondary | ICD-10-CM | POA: Diagnosis not present

## 2023-03-16 DIAGNOSIS — S72451D Displaced supracondylar fracture without intracondylar extension of lower end of right femur, subsequent encounter for closed fracture with routine healing: Secondary | ICD-10-CM | POA: Diagnosis not present

## 2023-03-16 DIAGNOSIS — M25562 Pain in left knee: Secondary | ICD-10-CM | POA: Diagnosis not present

## 2023-03-19 ENCOUNTER — Telehealth: Payer: Self-pay | Admitting: Orthopedic Surgery

## 2023-03-19 NOTE — Telephone Encounter (Signed)
Mailed reminder letter to patient today 

## 2023-03-23 DIAGNOSIS — L600 Ingrowing nail: Secondary | ICD-10-CM | POA: Diagnosis not present

## 2023-03-23 DIAGNOSIS — S7290XA Unspecified fracture of unspecified femur, initial encounter for closed fracture: Secondary | ICD-10-CM | POA: Diagnosis not present

## 2023-03-23 DIAGNOSIS — R5383 Other fatigue: Secondary | ICD-10-CM | POA: Diagnosis not present

## 2023-03-25 DIAGNOSIS — E7800 Pure hypercholesterolemia, unspecified: Secondary | ICD-10-CM | POA: Diagnosis not present

## 2023-03-25 DIAGNOSIS — E162 Hypoglycemia, unspecified: Secondary | ICD-10-CM | POA: Diagnosis not present

## 2023-03-25 DIAGNOSIS — M7989 Other specified soft tissue disorders: Secondary | ICD-10-CM | POA: Diagnosis not present

## 2023-03-25 DIAGNOSIS — E1169 Type 2 diabetes mellitus with other specified complication: Secondary | ICD-10-CM | POA: Diagnosis not present

## 2023-03-31 DIAGNOSIS — E119 Type 2 diabetes mellitus without complications: Secondary | ICD-10-CM | POA: Diagnosis not present

## 2023-04-01 DIAGNOSIS — E1165 Type 2 diabetes mellitus with hyperglycemia: Secondary | ICD-10-CM | POA: Diagnosis not present

## 2023-04-01 DIAGNOSIS — I10 Essential (primary) hypertension: Secondary | ICD-10-CM | POA: Diagnosis not present

## 2023-04-06 DIAGNOSIS — B351 Tinea unguium: Secondary | ICD-10-CM | POA: Diagnosis not present

## 2023-04-06 DIAGNOSIS — L84 Corns and callosities: Secondary | ICD-10-CM | POA: Diagnosis not present

## 2023-04-06 DIAGNOSIS — E1142 Type 2 diabetes mellitus with diabetic polyneuropathy: Secondary | ICD-10-CM | POA: Diagnosis not present

## 2023-04-06 DIAGNOSIS — M79676 Pain in unspecified toe(s): Secondary | ICD-10-CM | POA: Diagnosis not present

## 2023-04-08 ENCOUNTER — Encounter (HOSPITAL_COMMUNITY): Payer: Self-pay

## 2023-04-08 DIAGNOSIS — R7989 Other specified abnormal findings of blood chemistry: Secondary | ICD-10-CM | POA: Diagnosis not present

## 2023-04-08 DIAGNOSIS — Z1152 Encounter for screening for COVID-19: Secondary | ICD-10-CM | POA: Diagnosis not present

## 2023-04-08 DIAGNOSIS — D72829 Elevated white blood cell count, unspecified: Secondary | ICD-10-CM | POA: Diagnosis not present

## 2023-04-08 DIAGNOSIS — Z91041 Radiographic dye allergy status: Secondary | ICD-10-CM | POA: Diagnosis not present

## 2023-04-08 DIAGNOSIS — I2489 Other forms of acute ischemic heart disease: Secondary | ICD-10-CM | POA: Diagnosis not present

## 2023-04-08 DIAGNOSIS — R0989 Other specified symptoms and signs involving the circulatory and respiratory systems: Secondary | ICD-10-CM | POA: Diagnosis not present

## 2023-04-08 DIAGNOSIS — Z7982 Long term (current) use of aspirin: Secondary | ICD-10-CM | POA: Diagnosis not present

## 2023-04-08 DIAGNOSIS — R9431 Abnormal electrocardiogram [ECG] [EKG]: Secondary | ICD-10-CM | POA: Diagnosis not present

## 2023-04-08 DIAGNOSIS — F319 Bipolar disorder, unspecified: Secondary | ICD-10-CM | POA: Diagnosis not present

## 2023-04-08 DIAGNOSIS — Z7985 Long-term (current) use of injectable non-insulin antidiabetic drugs: Secondary | ICD-10-CM | POA: Diagnosis not present

## 2023-04-08 DIAGNOSIS — Z20822 Contact with and (suspected) exposure to covid-19: Secondary | ICD-10-CM | POA: Diagnosis not present

## 2023-04-08 DIAGNOSIS — J9811 Atelectasis: Secondary | ICD-10-CM | POA: Diagnosis not present

## 2023-04-08 DIAGNOSIS — W19XXXA Unspecified fall, initial encounter: Secondary | ICD-10-CM | POA: Diagnosis not present

## 2023-04-08 DIAGNOSIS — J90 Pleural effusion, not elsewhere classified: Secondary | ICD-10-CM | POA: Diagnosis not present

## 2023-04-08 DIAGNOSIS — Z794 Long term (current) use of insulin: Secondary | ICD-10-CM | POA: Diagnosis not present

## 2023-04-08 DIAGNOSIS — R1110 Vomiting, unspecified: Secondary | ICD-10-CM | POA: Diagnosis not present

## 2023-04-08 DIAGNOSIS — R4182 Altered mental status, unspecified: Secondary | ICD-10-CM | POA: Diagnosis not present

## 2023-04-08 DIAGNOSIS — Z880 Allergy status to penicillin: Secondary | ICD-10-CM | POA: Diagnosis not present

## 2023-04-08 DIAGNOSIS — I10 Essential (primary) hypertension: Secondary | ICD-10-CM | POA: Diagnosis not present

## 2023-04-08 DIAGNOSIS — Z955 Presence of coronary angioplasty implant and graft: Secondary | ICD-10-CM | POA: Diagnosis not present

## 2023-04-08 DIAGNOSIS — E119 Type 2 diabetes mellitus without complications: Secondary | ICD-10-CM | POA: Diagnosis not present

## 2023-04-08 DIAGNOSIS — E876 Hypokalemia: Secondary | ICD-10-CM | POA: Diagnosis not present

## 2023-04-08 DIAGNOSIS — I2510 Atherosclerotic heart disease of native coronary artery without angina pectoris: Secondary | ICD-10-CM | POA: Diagnosis not present

## 2023-04-08 DIAGNOSIS — R531 Weakness: Secondary | ICD-10-CM | POA: Diagnosis not present

## 2023-04-09 ENCOUNTER — Inpatient Hospital Stay (HOSPITAL_COMMUNITY)
Admission: AD | Admit: 2023-04-09 | Discharge: 2023-04-22 | DRG: 280 | Disposition: A | Discharge status: Skilled Nursing Facility | Payer: PPO | Source: Other Acute Inpatient Hospital | Attending: Internal Medicine | Admitting: Internal Medicine

## 2023-04-09 ENCOUNTER — Inpatient Hospital Stay (HOSPITAL_COMMUNITY): Payer: PPO

## 2023-04-09 DIAGNOSIS — D509 Iron deficiency anemia, unspecified: Secondary | ICD-10-CM | POA: Diagnosis not present

## 2023-04-09 DIAGNOSIS — I2510 Atherosclerotic heart disease of native coronary artery without angina pectoris: Secondary | ICD-10-CM

## 2023-04-09 DIAGNOSIS — E782 Mixed hyperlipidemia: Secondary | ICD-10-CM

## 2023-04-09 DIAGNOSIS — Z955 Presence of coronary angioplasty implant and graft: Secondary | ICD-10-CM

## 2023-04-09 DIAGNOSIS — I429 Cardiomyopathy, unspecified: Secondary | ICD-10-CM | POA: Diagnosis not present

## 2023-04-09 DIAGNOSIS — Z794 Long term (current) use of insulin: Secondary | ICD-10-CM

## 2023-04-09 DIAGNOSIS — I10 Essential (primary) hypertension: Secondary | ICD-10-CM

## 2023-04-09 DIAGNOSIS — F419 Anxiety disorder, unspecified: Secondary | ICD-10-CM | POA: Diagnosis not present

## 2023-04-09 DIAGNOSIS — I63539 Cerebral infarction due to unspecified occlusion or stenosis of unspecified posterior cerebral artery: Secondary | ICD-10-CM | POA: Diagnosis not present

## 2023-04-09 DIAGNOSIS — I252 Old myocardial infarction: Secondary | ICD-10-CM

## 2023-04-09 DIAGNOSIS — E871 Hypo-osmolality and hyponatremia: Secondary | ICD-10-CM | POA: Diagnosis present

## 2023-04-09 DIAGNOSIS — I63441 Cerebral infarction due to embolism of right cerebellar artery: Secondary | ICD-10-CM | POA: Diagnosis not present

## 2023-04-09 DIAGNOSIS — M19031 Primary osteoarthritis, right wrist: Secondary | ICD-10-CM | POA: Diagnosis not present

## 2023-04-09 DIAGNOSIS — F32A Depression, unspecified: Secondary | ICD-10-CM | POA: Diagnosis not present

## 2023-04-09 DIAGNOSIS — R0600 Dyspnea, unspecified: Secondary | ICD-10-CM | POA: Diagnosis not present

## 2023-04-09 DIAGNOSIS — I493 Ventricular premature depolarization: Secondary | ICD-10-CM | POA: Diagnosis not present

## 2023-04-09 DIAGNOSIS — H9311 Tinnitus, right ear: Secondary | ICD-10-CM | POA: Diagnosis present

## 2023-04-09 DIAGNOSIS — I340 Nonrheumatic mitral (valve) insufficiency: Secondary | ICD-10-CM | POA: Diagnosis not present

## 2023-04-09 DIAGNOSIS — R41841 Cognitive communication deficit: Secondary | ICD-10-CM | POA: Diagnosis not present

## 2023-04-09 DIAGNOSIS — R29810 Facial weakness: Secondary | ICD-10-CM | POA: Diagnosis not present

## 2023-04-09 DIAGNOSIS — Z7984 Long term (current) use of oral hypoglycemic drugs: Secondary | ICD-10-CM

## 2023-04-09 DIAGNOSIS — I5021 Acute systolic (congestive) heart failure: Secondary | ICD-10-CM | POA: Diagnosis not present

## 2023-04-09 DIAGNOSIS — I63511 Cerebral infarction due to unspecified occlusion or stenosis of right middle cerebral artery: Secondary | ICD-10-CM | POA: Diagnosis not present

## 2023-04-09 DIAGNOSIS — G459 Transient cerebral ischemic attack, unspecified: Secondary | ICD-10-CM | POA: Diagnosis not present

## 2023-04-09 DIAGNOSIS — I6340 Cerebral infarction due to embolism of unspecified cerebral artery: Secondary | ICD-10-CM | POA: Diagnosis not present

## 2023-04-09 DIAGNOSIS — R278 Other lack of coordination: Secondary | ICD-10-CM | POA: Diagnosis not present

## 2023-04-09 DIAGNOSIS — I110 Hypertensive heart disease with heart failure: Secondary | ICD-10-CM | POA: Diagnosis present

## 2023-04-09 DIAGNOSIS — J90 Pleural effusion, not elsewhere classified: Secondary | ICD-10-CM | POA: Diagnosis not present

## 2023-04-09 DIAGNOSIS — Z91041 Radiographic dye allergy status: Secondary | ICD-10-CM

## 2023-04-09 DIAGNOSIS — Y840 Cardiac catheterization as the cause of abnormal reaction of the patient, or of later complication, without mention of misadventure at the time of the procedure: Secondary | ICD-10-CM | POA: Diagnosis not present

## 2023-04-09 DIAGNOSIS — I6621 Occlusion and stenosis of right posterior cerebral artery: Secondary | ICD-10-CM | POA: Diagnosis not present

## 2023-04-09 DIAGNOSIS — Z1152 Encounter for screening for COVID-19: Secondary | ICD-10-CM | POA: Diagnosis not present

## 2023-04-09 DIAGNOSIS — R1312 Dysphagia, oropharyngeal phase: Secondary | ICD-10-CM | POA: Diagnosis not present

## 2023-04-09 DIAGNOSIS — I517 Cardiomegaly: Secondary | ICD-10-CM | POA: Diagnosis not present

## 2023-04-09 DIAGNOSIS — G464 Cerebellar stroke syndrome: Secondary | ICD-10-CM | POA: Diagnosis not present

## 2023-04-09 DIAGNOSIS — M4628 Osteomyelitis of vertebra, sacral and sacrococcygeal region: Secondary | ICD-10-CM | POA: Diagnosis present

## 2023-04-09 DIAGNOSIS — R7989 Other specified abnormal findings of blood chemistry: Secondary | ICD-10-CM | POA: Diagnosis not present

## 2023-04-09 DIAGNOSIS — I214 Non-ST elevation (NSTEMI) myocardial infarction: Principal | ICD-10-CM | POA: Diagnosis present

## 2023-04-09 DIAGNOSIS — D72829 Elevated white blood cell count, unspecified: Secondary | ICD-10-CM

## 2023-04-09 DIAGNOSIS — G9389 Other specified disorders of brain: Secondary | ICD-10-CM | POA: Diagnosis not present

## 2023-04-09 DIAGNOSIS — M6281 Muscle weakness (generalized): Secondary | ICD-10-CM | POA: Diagnosis not present

## 2023-04-09 DIAGNOSIS — N400 Enlarged prostate without lower urinary tract symptoms: Secondary | ICD-10-CM

## 2023-04-09 DIAGNOSIS — I63439 Cerebral infarction due to embolism of unspecified posterior cerebral artery: Secondary | ICD-10-CM | POA: Diagnosis not present

## 2023-04-09 DIAGNOSIS — R509 Fever, unspecified: Secondary | ICD-10-CM | POA: Diagnosis not present

## 2023-04-09 DIAGNOSIS — Z96653 Presence of artificial knee joint, bilateral: Secondary | ICD-10-CM | POA: Diagnosis present

## 2023-04-09 DIAGNOSIS — H9191 Unspecified hearing loss, right ear: Secondary | ICD-10-CM | POA: Diagnosis present

## 2023-04-09 DIAGNOSIS — R0789 Other chest pain: Secondary | ICD-10-CM | POA: Diagnosis present

## 2023-04-09 DIAGNOSIS — Z880 Allergy status to penicillin: Secondary | ICD-10-CM

## 2023-04-09 DIAGNOSIS — E119 Type 2 diabetes mellitus without complications: Secondary | ICD-10-CM

## 2023-04-09 DIAGNOSIS — M25572 Pain in left ankle and joints of left foot: Secondary | ICD-10-CM | POA: Diagnosis not present

## 2023-04-09 DIAGNOSIS — L89159 Pressure ulcer of sacral region, unspecified stage: Secondary | ICD-10-CM | POA: Diagnosis not present

## 2023-04-09 DIAGNOSIS — Z8249 Family history of ischemic heart disease and other diseases of the circulatory system: Secondary | ICD-10-CM

## 2023-04-09 DIAGNOSIS — R4182 Altered mental status, unspecified: Secondary | ICD-10-CM | POA: Diagnosis not present

## 2023-04-09 DIAGNOSIS — Z7401 Bed confinement status: Secondary | ICD-10-CM

## 2023-04-09 DIAGNOSIS — E8809 Other disorders of plasma-protein metabolism, not elsewhere classified: Secondary | ICD-10-CM | POA: Diagnosis not present

## 2023-04-09 DIAGNOSIS — I639 Cerebral infarction, unspecified: Secondary | ICD-10-CM | POA: Diagnosis not present

## 2023-04-09 DIAGNOSIS — I358 Other nonrheumatic aortic valve disorders: Secondary | ICD-10-CM | POA: Diagnosis not present

## 2023-04-09 DIAGNOSIS — E876 Hypokalemia: Secondary | ICD-10-CM | POA: Diagnosis present

## 2023-04-09 DIAGNOSIS — R531 Weakness: Secondary | ICD-10-CM | POA: Diagnosis not present

## 2023-04-09 DIAGNOSIS — T380X5A Adverse effect of glucocorticoids and synthetic analogues, initial encounter: Secondary | ICD-10-CM | POA: Diagnosis present

## 2023-04-09 DIAGNOSIS — Z9049 Acquired absence of other specified parts of digestive tract: Secondary | ICD-10-CM

## 2023-04-09 DIAGNOSIS — Z79899 Other long term (current) drug therapy: Secondary | ICD-10-CM

## 2023-04-09 DIAGNOSIS — I6329 Cerebral infarction due to unspecified occlusion or stenosis of other precerebral arteries: Secondary | ICD-10-CM | POA: Diagnosis not present

## 2023-04-09 DIAGNOSIS — R079 Chest pain, unspecified: Secondary | ICD-10-CM | POA: Diagnosis not present

## 2023-04-09 DIAGNOSIS — M8580 Other specified disorders of bone density and structure, unspecified site: Secondary | ICD-10-CM | POA: Diagnosis not present

## 2023-04-09 DIAGNOSIS — Z5941 Food insecurity: Secondary | ICD-10-CM

## 2023-04-09 DIAGNOSIS — E785 Hyperlipidemia, unspecified: Secondary | ICD-10-CM | POA: Diagnosis not present

## 2023-04-09 DIAGNOSIS — R45851 Suicidal ideations: Secondary | ICD-10-CM | POA: Diagnosis present

## 2023-04-09 DIAGNOSIS — L89153 Pressure ulcer of sacral region, stage 3: Secondary | ICD-10-CM | POA: Diagnosis not present

## 2023-04-09 DIAGNOSIS — K148 Other diseases of tongue: Secondary | ICD-10-CM | POA: Diagnosis present

## 2023-04-09 DIAGNOSIS — M19072 Primary osteoarthritis, left ankle and foot: Secondary | ICD-10-CM | POA: Diagnosis not present

## 2023-04-09 DIAGNOSIS — I97820 Postprocedural cerebrovascular infarction during cardiac surgery: Secondary | ICD-10-CM | POA: Diagnosis not present

## 2023-04-09 DIAGNOSIS — Z7985 Long-term (current) use of injectable non-insulin antidiabetic drugs: Secondary | ICD-10-CM

## 2023-04-09 DIAGNOSIS — M7732 Calcaneal spur, left foot: Secondary | ICD-10-CM | POA: Diagnosis not present

## 2023-04-09 DIAGNOSIS — R112 Nausea with vomiting, unspecified: Secondary | ICD-10-CM | POA: Diagnosis not present

## 2023-04-09 DIAGNOSIS — I959 Hypotension, unspecified: Secondary | ICD-10-CM | POA: Diagnosis not present

## 2023-04-09 DIAGNOSIS — Z5986 Financial insecurity: Secondary | ICD-10-CM

## 2023-04-09 DIAGNOSIS — M25531 Pain in right wrist: Secondary | ICD-10-CM | POA: Diagnosis not present

## 2023-04-09 DIAGNOSIS — Z951 Presence of aortocoronary bypass graft: Secondary | ICD-10-CM

## 2023-04-09 DIAGNOSIS — Z7901 Long term (current) use of anticoagulants: Secondary | ICD-10-CM

## 2023-04-09 DIAGNOSIS — I6782 Cerebral ischemia: Secondary | ICD-10-CM | POA: Diagnosis not present

## 2023-04-09 DIAGNOSIS — K219 Gastro-esophageal reflux disease without esophagitis: Secondary | ICD-10-CM | POA: Diagnosis present

## 2023-04-09 DIAGNOSIS — Z8673 Personal history of transient ischemic attack (TIA), and cerebral infarction without residual deficits: Secondary | ICD-10-CM | POA: Diagnosis not present

## 2023-04-09 DIAGNOSIS — F319 Bipolar disorder, unspecified: Secondary | ICD-10-CM | POA: Diagnosis not present

## 2023-04-09 DIAGNOSIS — I69398 Other sequelae of cerebral infarction: Secondary | ICD-10-CM

## 2023-04-09 DIAGNOSIS — Z91148 Patient's other noncompliance with medication regimen for other reason: Secondary | ICD-10-CM

## 2023-04-09 DIAGNOSIS — Z7982 Long term (current) use of aspirin: Secondary | ICD-10-CM

## 2023-04-09 LAB — GLUCOSE, CAPILLARY
Glucose-Capillary: 91 mg/dL (ref 70–99)
Glucose-Capillary: 133 mg/dL — ABNORMAL HIGH (ref 70–99)

## 2023-04-09 LAB — BASIC METABOLIC PANEL
Anion gap: 12 (ref 5–15)
BUN: 24 mg/dL — ABNORMAL HIGH (ref 8–23)
CO2: 27 mmol/L (ref 22–32)
Calcium: 8.6 mg/dL — ABNORMAL LOW (ref 8.9–10.3)
Chloride: 102 mmol/L (ref 98–111)
Creatinine, Ser: 1.03 mg/dL (ref 0.61–1.24)
GFR, Estimated: >60 mL/min (ref >60)
Glucose, Bld: 111 mg/dL — ABNORMAL HIGH (ref 70–99)
Potassium: 3.5 mmol/L (ref 3.5–5.1)
Sodium: 141 mmol/L (ref 135–145)

## 2023-04-09 LAB — CBC WITH DIFFERENTIAL/PLATELET
Abs Immature Granulocytes: 0.04 10*3/uL (ref 0.00–0.07)
Basophils Absolute: 0 10*3/uL (ref 0.0–0.1)
Basophils Relative: 0 %
Eosinophils Absolute: 0 10*3/uL (ref 0.0–0.5)
Eosinophils Relative: 0 %
HCT: 40.5 % (ref 39.0–52.0)
Hemoglobin: 12.9 g/dL — ABNORMAL LOW (ref 13.0–17.0)
Immature Granulocytes: 0 %
Lymphocytes Relative: 6 %
Lymphs Abs: 0.6 10*3/uL — ABNORMAL LOW (ref 0.7–4.0)
MCH: 25 pg — ABNORMAL LOW (ref 26.0–34.0)
MCHC: 31.9 g/dL (ref 30.0–36.0)
MCV: 78.5 fL — ABNORMAL LOW (ref 80.0–100.0)
Monocytes Absolute: 0.8 10*3/uL (ref 0.1–1.0)
Monocytes Relative: 8 %
Neutro Abs: 8.4 10*3/uL — ABNORMAL HIGH (ref 1.7–7.7)
Neutrophils Relative %: 86 %
Platelets: 141 10*3/uL — ABNORMAL LOW (ref 150–400)
RBC: 5.16 MIL/uL (ref 4.22–5.81)
RDW: 16.7 % — ABNORMAL HIGH (ref 11.5–15.5)
WBC: 9.9 10*3/uL (ref 4.0–10.5)
nRBC: 0 % (ref 0.0–0.2)

## 2023-04-09 LAB — MAGNESIUM: Magnesium: 2.1 mg/dL (ref 1.7–2.4)

## 2023-04-09 LAB — RESPIRATORY PANEL BY PCR

## 2023-04-09 LAB — HEPARIN LEVEL (UNFRACTIONATED): Heparin Unfractionated: 0.3 [IU]/mL (ref 0.30–0.70)

## 2023-04-09 LAB — APTT: aPTT: 58 s — ABNORMAL HIGH (ref 24–36)

## 2023-04-09 MED ORDER — ACETAMINOPHEN 325 MG PO TABS: 650.0000 mg | ORAL_TABLET | ORAL | Status: DC | PRN

## 2023-04-09 MED ORDER — ACETAMINOPHEN 325 MG PO TABS
650.0000 mg | ORAL_TABLET | ORAL | Status: DC | PRN
  Administered 2023-04-09 – 2023-04-21 (×11): 650 mg via ORAL
  Filled 2023-04-09 (×12): qty 2

## 2023-04-09 MED ORDER — ASPIRIN 81 MG PO CHEW: 324.0000 mg | CHEWABLE_TABLET | ORAL | Status: DC

## 2023-04-09 MED ORDER — ATORVASTATIN CALCIUM 10 MG PO TABS: 10.0000 mg | ORAL_TABLET | Freq: Every day | ORAL | Status: DC

## 2023-04-09 MED ORDER — ISOSORBIDE MONONITRATE ER 30 MG PO TB24
30.0000 mg | ORAL_TABLET | Freq: Every day | ORAL | Status: DC
  Administered 2023-04-10 – 2023-04-12 (×3): 30 mg via ORAL
  Filled 2023-04-09 (×4): qty 1

## 2023-04-09 MED ORDER — FUROSEMIDE 40 MG PO TABS
40.0000 mg | ORAL_TABLET | Freq: Every day | ORAL | Status: DC
  Administered 2023-04-10 – 2023-04-12 (×3): 40 mg via ORAL
  Filled 2023-04-09 (×3): qty 1

## 2023-04-09 MED ORDER — METOPROLOL SUCCINATE ER 50 MG PO TB24
50.0000 mg | ORAL_TABLET | Freq: Every day | ORAL | Status: DC
  Administered 2023-04-10: 50 mg via ORAL
  Filled 2023-04-09: qty 1

## 2023-04-09 MED ORDER — ASPIRIN 300 MG RE SUPP
300.0000 mg | RECTAL | Status: DC
  Filled 2023-04-09: qty 1

## 2023-04-09 MED ORDER — NITROGLYCERIN 0.4 MG SL SUBL
0.4000 mg | SUBLINGUAL_TABLET | SUBLINGUAL | Status: DC | PRN
Start: 2023-04-09 — End: 2023-04-22

## 2023-04-09 MED ORDER — ASPIRIN 81 MG PO TBEC
81.0000 mg | DELAYED_RELEASE_TABLET | Freq: Every day | ORAL | Status: DC
  Administered 2023-04-10 – 2023-04-22 (×12): 81 mg via ORAL
  Filled 2023-04-09 (×13): qty 1

## 2023-04-09 MED ORDER — ONDANSETRON HCL 4 MG/2ML IJ SOLN: 4.0000 mg | Freq: Four times a day (QID) | INTRAMUSCULAR | Status: DC | PRN

## 2023-04-09 MED ORDER — INSULIN GLARGINE-YFGN 100 UNIT/ML ~~LOC~~ SOLN
5.0000 [IU] | Freq: Every day | SUBCUTANEOUS | Status: DC
  Administered 2023-04-09 – 2023-04-12 (×4): 5 [IU] via SUBCUTANEOUS
  Filled 2023-04-09 (×5): qty 0.05

## 2023-04-09 MED ORDER — INSULIN ASPART 100 UNIT/ML IJ SOLN: 0.0000 [IU] | Freq: Three times a day (TID) | INTRAMUSCULAR | Status: DC

## 2023-04-09 MED ORDER — HEPARIN (PORCINE) 25000 UT/250ML-% IV SOLN
1400.0000 [IU]/h | INTRAVENOUS | Status: DC
  Administered 2023-04-09: 1150 [IU]/h via INTRAVENOUS
  Administered 2023-04-10: 1200 [IU]/h via INTRAVENOUS
  Filled 2023-04-09: qty 250

## 2023-04-09 MED ORDER — FLUVOXAMINE MALEATE 100 MG PO TABS
200.0000 mg | ORAL_TABLET | Freq: Every day | ORAL | Status: DC
  Administered 2023-04-10 – 2023-04-19 (×9): 200 mg via ORAL
  Filled 2023-04-09 (×10): qty 2

## 2023-04-09 MED ORDER — OXYCODONE HCL 5 MG PO TABS
2.5000 mg | ORAL_TABLET | Freq: Once | ORAL | Status: AC
Start: 2023-04-09 — End: 2023-04-09
  Administered 2023-04-09: 2.5 mg via ORAL
  Filled 2023-04-09: qty 1

## 2023-04-09 MED ORDER — TRAZODONE HCL 50 MG PO TABS
150.0000 mg | ORAL_TABLET | Freq: Every day | ORAL | Status: DC
  Administered 2023-04-09 – 2023-04-21 (×13): 150 mg via ORAL
  Filled 2023-04-09 (×13): qty 1

## 2023-04-09 MED ORDER — POTASSIUM CHLORIDE CRYS ER 20 MEQ PO TBCR
40.0000 meq | EXTENDED_RELEASE_TABLET | Freq: Once | ORAL | Status: AC
  Administered 2023-04-09: 40 meq via ORAL
  Filled 2023-04-09: qty 2

## 2023-04-09 MED ORDER — PANTOPRAZOLE SODIUM 40 MG PO TBEC
40.0000 mg | DELAYED_RELEASE_TABLET | Freq: Every day | ORAL | Status: DC
Start: 2023-04-10 — End: 2023-04-22
  Administered 2023-04-10 – 2023-04-22 (×12): 40 mg via ORAL
  Filled 2023-04-09 (×13): qty 1

## 2023-04-09 MED ORDER — ATORVASTATIN CALCIUM 40 MG PO TABS
40.0000 mg | ORAL_TABLET | Freq: Every day | ORAL | Status: DC
  Administered 2023-04-10 – 2023-04-12 (×3): 40 mg via ORAL
  Filled 2023-04-09 (×4): qty 1

## 2023-04-09 NOTE — Progress Notes (Signed)
PHARMACY - ANTICOAGULATION CONSULT NOTE  Pharmacy Consult for IV heparin Indication: chest pain/ACS  Allergies  Allergen Reactions   Iodinated Contrast Media Hives and Itching   Penicillins Other (See Comments)    Unknown reaction in childhood    Patient Measurements:   Heparin Dosing Weight: ~100 kg in 6/24  Vital Signs: Temp: 100.7 F (38.2 C) (11/08 1520) Temp Source: Oral (11/08 1520) BP: 132/92 (11/08 1520) Pulse Rate: 89 (11/08 1520)  Labs: Recent Labs    04/09/23 1747  HGB 12.9*  HCT 40.5  PLT 141*  APTT 58*  HEPARINUNFRC 0.30  CREATININE 1.03    CrCl cannot be calculated (Unknown ideal weight.).   Medical History: Past Medical History:  Diagnosis Date   Allergic reaction to contrast dye    Bipolar affective disorder (HCC)    Coronary atherosclerosis of native coronary artery    BMS to RCA 2008, [facility], Dr. 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  Initial level 0.3 (at bottom end of goal range).  No overt bleeding or complications noted.  Goal of Therapy:  Heparin level 0.3-0.7 units/ml Monitor platelets by anticoagulation protocol: Yes   Plan:  Increase IV heparin to 1200 units/hr. Recheck heparin level in 8 hrs Daily heparin level and CBC. F/u plans for cath lab Monday.  Reece Leader, Colon Flattery, BCCP Clinical Pharmacist  04/09/2023 7:32 PM   [facility] pharmacy phone numbers are listed on [email or web address]

## 2023-04-09 NOTE — Consult Note (Signed)
Cardiology Consultation   Patient ID: Michael Rose MRN: [id number]; DOB: 1950-11-07  Admit date: 04/09/2023 Date of Consult: 04/09/2023  PCP:  Michael Peri, MD   [facility] Providers Cardiologist:  None        Patient Profile:   Michael Rose is a 72 y.o. male with a history of CAD with MI in 2008 s/p BMS to RCA, hypertension, hyperlipidemia, type 2 diabetes mellitus, GERD, anxiety/ depression, and bipolar affective disorder who is being seen 04/09/2023 for the evaluation of NSTEMI at the request of Dr. Geraldo Rose.  History of Present Illness:   Michael Rose is a 72 year old male with the above history. He has a history of remote MI in 2008 s/p BMS to RCA. Myoview in 2014 was low risk with evidence of scar and mild Rose-infarct ischemia. He has not been seen by Cardiology since 2018. Last Echo in 10/2022 showed LVEF of 55-60% with normal wall motion, mild LVH, and grade 1 diastolic dysfunction.  Patient presented to the [facility] ED on 04/08/2023 for further evaluation of weakness and altered mental status. EKG showed sinus tachycardia, rate 103 bpm, with diffuse ST depressions. High-sensitivity troponin 398 >> 711 >> 1,431 >> 1,665 >> 1,922 >> 2,85. Chest x-ray showed mild vascular congestion and small right pleural effusion with right base atelectasis. WBC 17.4, Hgb 13.7, Plts 170. Na 144, K 1.8, Glucose 174,  BUN 15, Cr 1.15. Total Bili 1.3 but otherwise LFTs normal. Magnesium 1.6. Lipase normal. TSH 0.972. ETOH normal. Ammonia 25. Lactic acid 2.1. Respiratory panel negative for COVID, influenza, and RSV. Urinalysis showed >1000 glucose and 15 ketones as well as few yeast. UDS positive for opiates. Head CT showed a chronic right basal ganglia lacunar infarct and chronic microvascular disease but no acute findings. Potassium was repleted and he was given IV Zofran. Patient was transferred to [facility] for further evaluation.  At time of evaluation, patient is resting  comfortably no acute distress.  His mental status has improved but wife states he is still somewhat confused.  She assists with the history.  Patient some dyspnea on exertion for the past 2 weeks.  His mobility has been extremely limited for the past couple of months after fracturing his femur in 10/2022.  His wife states that he can ambulate with a walker from his wheelchair to the bathroom but then mostly uses a wheelchair to get around.  Over the last 2 weeks, she reports he has had significant dyspnea after wheeling from 1 room in their apartment to another.  He denies any shortness of breath at rest.  No orthopnea or PND.  He reports some swelling in his left foot for the past month or was no edema.  He denies any chest pain.  He started having decreased appetite on Wednesday (04/07/2023) and states he was spitting up.  His wife then noticed that he was agitated on Thursday morning (04/08/2023). He then started vomiting and became disoriented. Patient states he only vomited 3 times but his wife states he vomited much more than that. Wife ultimately called 911 because he was so agitated and disoriented. Patient describes one episode of near syncope after getting up to use the bathroom. This occurred after multiple episodes of vomiting. However, he denies any palpitations or syncope. Wife states that when EMS arrived, he had a fever and states they were also told he had a mild fever at [facility] this morning. He denies any other recent illnesses prior  to this. No abnormal bleeding in urine or stools. He also states he has not slept in 48 hours due to anxiety.   Past Medical History:  Diagnosis Date   Allergic reaction to contrast dye    Bipolar affective disorder (HCC)    Coronary atherosclerosis of native coronary artery    BMS to RCA 2008, [facility], Dr. 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     Past Surgical History:  Procedure Laterality Date   CHOLECYSTECTOMY  2013   JOINT REPLACEMENT     ORIF FEMUR FRACTURE Right 11/20/2022   Procedure: OPEN REDUCTION INTERNAL FIXATION (ORIF) DISTAL FEMUR FRACTURE;  Surgeon: Michael Lofts, MD;  Location: [facility] OR;  Service: Orthopedics;  Laterality: Right;   TOTAL KNEE ARTHROPLASTY     Left   TOTAL KNEE ARTHROPLASTY Right 10/21/2012   Dr Michael Rose   TOTAL KNEE ARTHROPLASTY Right 10/21/2012   Procedure: TOTAL KNEE ARTHROPLASTY;  Surgeon: Michael Rose., MD;  Location: [facility] OR;  Service: Orthopedics;  Laterality: Right;   VASECTOMY       Home Medications:  Prior to Admission medications   Medication Sig Start Date End Date Taking? Authorizing Provider  aspirin 81 MG tablet Take 81 mg by mouth daily.   Yes [provider]  atorvastatin (LIPITOR) 10 MG tablet Take 10 mg by mouth daily.   Yes [provider]  empagliflozin (JARDIANCE) 25 MG TABS tablet Take 25 mg by mouth daily.   Yes [provider]  fluvoxaMINE (LUVOX) 100 MG tablet Take 200 mg by mouth daily.   Yes [provider]  furosemide (LASIX) 40 MG tablet Take 40 mg by mouth daily. 02/17/23  Yes [provider]  HYDROcodone-acetaminophen (NORCO) 7.5-325 MG tablet Take 1 tablet by mouth every 6 (six) hours as needed for moderate pain (pain score 4-6) or severe pain (pain score 7-10).   Yes [provider]  IBU 800 MG tablet Take 800 mg by mouth every 8 (eight) hours as needed for headache or moderate pain. 10/30/22  Yes [provider]  Insulin Degludec (TRESIBA) 100 UNIT/ML SOLN Inject 14 Units into the skin at bedtime.   Yes [provider]  isosorbide mononitrate (IMDUR) 30 MG 24 hr tablet TAKE 1 TABLET BY MOUTH EVERY DAY Patient taking differently: Take 30 mg by mouth daily. 07/17/14  Yes Michael Sidle, MD  levocetirizine (XYZAL) 5 MG tablet Take 5 mg by mouth daily. 10/26/22  Yes [provider]  methocarbamol (ROBAXIN) 500 MG tablet Take 1 tablet (500 mg total) by mouth every 6 (six) hours as needed for muscle spasms. 11/23/22  Yes McClung, Sarah A, PA-C  metoprolol succinate (TOPROL-XL) 50 MG 24 hr tablet TAKE 1 TABLET BY MOUTH EVERY MORNING AND TAKE 1/2 TABLET EVERY EVENING - NEEDS APPOINTMENT Patient taking differently: Take 50 mg by mouth daily. 03/07/19  Yes Michael Sidle, MD  NOVOLIN N 100 UNIT/ML injection Inject 10-35 Units into the skin See admin instructions. Inject 10 units in the morning, 35 units with lunch, 10 units at bedtime. 10/13/22  Yes [provider]  OZEMPIC, 1 MG/DOSE, 4 MG/3ML SOPN Inject 1 mg into the skin every Monday. 10/22/22  Yes  [provider]  pantoprazole (PROTONIX) 40 MG tablet Take 40 mg by mouth daily.   Yes [provider]  polyethylene glycol (MIRALAX / GLYCOLAX) 17 g packet Take 17 g by mouth 2 (two) times daily. Patient taking differently: Take 17 g by mouth daily as needed for mild constipation, moderate constipation or severe constipation. 11/24/22  Yes Rodolph Bong, MD  traZODone (DESYREL) 150 MG tablet Take 150 mg by mouth at bedtime.   Yes [provider]  acetaminophen (TYLENOL) 325 MG tablet Take 650 mg by mouth as needed.    [provider]  apixaban (ELIQUIS) 2.5 MG TABS tablet Take 1 tablet (2.5 mg total) by mouth 2 (two) times daily. 11/23/22 04/09/23  West Bali, PA-C  feeding supplement (ENSURE ENLIVE / ENSURE PLUS) LIQD Take 237 mLs by mouth 2 (two) times daily between meals. 11/24/22   Rodolph Bong, MD  Multiple Vitamin (MULTIVITAMIN WITH MINERALS) TABS tablet Take 1 tablet by mouth daily. 11/25/22   Rodolph Bong, MD  oxyCODONE (OXY IR/ROXICODONE) 5 MG immediate release tablet Take 1 tablet (5 mg total) by mouth every 4 (four) hours as needed for breakthrough  pain or severe pain. 11/23/22   West Bali, PA-C  senna-docusate (SENOKOT-S) 8.6-50 MG tablet Take 1 tablet by mouth 2 (two) times daily. 11/24/22   Rodolph Bong, MD  tamsulosin (FLOMAX) 0.4 MG CAPS capsule Take 1 capsule (0.4 mg total) by mouth daily after supper. 04/20/22   Jerilee Field, MD  Vitamin D, Ergocalciferol, (DRISDOL) 1.25 MG (50000 UNIT) CAPS capsule Take 1 capsule (50,000 Units total) by mouth every 7 (seven) days. 11/28/22   West Bali, PA-C    Inpatient Medications: Scheduled Meds:  [START ON 04/10/2023] aspirin EC  81 mg Oral Daily   [START ON 04/10/2023] atorvastatin  10 mg Oral Daily   [START ON 04/10/2023] fluvoxaMINE  200 mg Oral Daily   [START ON 04/10/2023] isosorbide mononitrate  30 mg Oral Daily   [START ON 04/10/2023] metoprolol succinate  50 mg Oral Daily   [START ON 04/10/2023] pantoprazole  40 mg Oral Daily   traZODone  150 mg Oral QHS   Continuous Infusions:  heparin     PRN Meds: acetaminophen, nitroGLYCERIN, ondansetron (ZOFRAN) IV  Allergies:    Allergies  Allergen Reactions   Iodinated Contrast Media Hives and Itching   Penicillins Other (See Comments)    Unknown reaction in childhood    Social History:   Social History   Socioeconomic History   Marital status: Married    Spouse name: Not on file   Number of children: Not on file   Years of education: Not on file   Highest education level: Not on file  Occupational History   Occupation: disabled due to arthritis    Employer: DISABLED  Tobacco Use   Smoking status: Never   Smokeless tobacco: Never  Vaping Use   Vaping status: Never Used  Substance and Sexual Activity   Alcohol use: No    Alcohol/week: 0.0 standard drinks of alcohol   Drug use: No   Sexual activity: Not on file  Other Topics Concern   Not on file  Social History Narrative   Married   Social Determinants of Health   Financial Resource Strain: High Risk (01/19/2022)   Overall Financial Resource  Strain (CARDIA)    Difficulty of Paying Living Expenses: Hard  Food Insecurity: Food Insecurity Present (11/20/2022)   Hunger Vital Sign  Worried About Programme researcher, broadcasting/film/video in the Last Year: Often true    Ran Out of Food in the Last Year: Often true  Transportation Needs: No Transportation Needs (11/20/2022)   PRAPARE - Administrator, Civil Service (Medical): No    Lack of Transportation (Non-Medical): No  Physical Activity: Insufficiently Active (07/21/2022)   Received from [facility], [facility]   Exercise Vital Sign    Days of Exercise per Week: 4 days    Minutes of Exercise per Session: 30 min  Stress: No Stress Concern Present (07/21/2022)   Received from [facility] of Occupational [facility] - Occupational Stress Questionnaire    Feeling of Stress : Only a little  Social Connections: Socially Integrated (07/21/2022)   Received from [facility], [facility]   Social Connection and Isolation Panel [NHANES]    Frequency of Communication with Friends and Family: More than three times a week    Frequency of Social Gatherings with Friends and Family: More than three times a week    Attends Religious Services: More than 4 times per year    Active Member of [employer] or Organizations: Yes    Attends Engineer, structural: More than 4 times per year    Marital Status: Married  Catering manager Violence: Not At Risk (11/20/2022)   Humiliation, Afraid, Rape, and Kick questionnaire    Fear of Current or Ex-Partner: No    Emotionally Abused: No    Physically Abused: No    Sexually Abused: No    Family History:  Family History  Problem Relation Age of Onset   Heart disease Mother    Heart disease Father      ROS:  Please see the history of present illness.  Review of Systems  Constitutional:  Positive for fever.  HENT:  Positive for congestion.   Respiratory:  Positive for shortness of breath. Negative for  cough.   Cardiovascular:  Positive for leg swelling. Negative for chest pain, palpitations, orthopnea and PND.  Gastrointestinal:  Positive for vomiting. Negative for blood in stool and melena.  Genitourinary:  Negative for hematuria.  Musculoskeletal:  Negative for myalgias.  Neurological:  Positive for dizziness. Negative for loss of consciousness.  Endo/Heme/Allergies:  Does not bruise/bleed easily.  Psychiatric/Behavioral:  Negative for substance abuse.    Physical Exam/Data:   Vitals:   04/09/23 1520  BP: (!) 132/92  Pulse: 89  Resp: 20  Temp: (!) 100.7 F (38.2 C)  TempSrc: Oral  SpO2: 99%   No intake or output data in the 24 hours ending 04/09/23 1703    11/20/2022   11:20 AM 11/19/2022    4:20 PM 11/19/2022   11:54 AM  Last 3 Weights  Weight (lbs) 228 lb 13.4 oz 229 lb 230 lb  Weight (kg) 103.8 kg 103.874 kg 104.327 kg     There is no height or weight on file to calculate BMI.  General: 72 y.o. Caucasian male resting comfortably in no acute distress. HEENT: Normocephalic and atraumatic. Sclera clear. EOMs intact. Neck: Supple. No JVD. Heart: RRR. Distinct S1 and S2. No murmurs, gallops, or rubs.  Lungs: No increased work of breathing. Clear to ausculation bilaterally. No wheezes, rhonchi, or rales.  Abdomen: Soft, non-distended, and non-tender to palpation. Bowel sounds hyperactive.. Extremities: No to trace lower extremity edema.    Skin: Warm and dry. Neuro: Alert and oriented x3. No focal deficits. Psych:  Normal affect. Responds appropriately.   EKG:  The EKG was personally reviewed and demonstrates:   Initial EKG on 04/08/2023 showed sinus tachycardia, rate 103 bpm, with diffuse ST depressions. Repeat EKG on 04/09/2023 showed normal sinus rhythm, rate 91 bpm, with improvement in ST depressions (only slight ST depressions in V3-V5). Telemetry:  Telemetry was personally reviewed and demonstrates:  Normal sinus rhythm.  Relevant CV Studies:  Echocardiogram  11/20/2022: Impressions:  1. Left ventricular ejection fraction, by estimation, is 55 to 60%. The  left ventricle has normal function. The left ventricle has no regional  wall motion abnormalities. There is mild left ventricular hypertrophy.  Left ventricular diastolic parameters  are consistent with Grade I diastolic dysfunction (impaired relaxation).   2. Right ventricular systolic function is normal. The right ventricular  size is normal. Tricuspid regurgitation signal is inadequate for assessing  PA pressure.   3. The mitral valve is normal in structure. Trivial mitral valve  regurgitation. No evidence of mitral stenosis.   4. The aortic valve is tricuspid. There is mild calcification of the  aortic valve. There is mild thickening of the aortic valve. Aortic valve  regurgitation is not visualized. No aortic stenosis is present.   5. The inferior vena cava is normal in size with greater than 50%  respiratory variability, suggesting right atrial pressure of 3 mmHg.    Laboratory Data:  High Sensitivity Troponin:  No results for input(s): "TROPONINIHS" in the last 720 hours.   ChemistryNo results for input(s): "NA", "K", "CL", "CO2", "GLUCOSE", "BUN", "CREATININE", "CALCIUM", "MG", "GFRNONAA", "GFRAA", "ANIONGAP" in the last 168 hours.  No results for input(s): "PROT", "ALBUMIN", "AST", "ALT", "ALKPHOS", "BILITOT" in the last 168 hours. Lipids No results for input(s): "CHOL", "TRIG", "HDL", "LABVLDL", "LDLCALC", "CHOLHDL" in the last 168 hours.  HematologyNo results for input(s): "WBC", "RBC", "HGB", "HCT", "MCV", "MCH", "MCHC", "RDW", "PLT" in the last 168 hours. Thyroid No results for input(s): "TSH", "FREET4" in the last 168 hours.  BNPNo results for input(s): "BNP", "PROBNP" in the last 168 hours.  DDimer No results for input(s): "DDIMER" in the last 168 hours.   Radiology/Studies:  No results found.   Assessment and Plan:   NSTEMI CAD Patient has a history of CAD with MI  in 2008 s/p BMS to RCA. Myoview in 2014 showed evidence of prior infarct with mild Rose-infarct ischemia. He presented to [facility] for weakness and altered mental status as well as multiple episodes of vomiting and was found to have an elevated troponin. High-sensitivity troponin  elevated at 398 >> 711 >> 1,431 >> 1,665 >> 1,922 >> 2,853. EKG showed normal sinus rhythm with slight ST depressions in leads V3-V5. This was in the setting of severe hypokalemia. ST depressions improved on repeat EKG. - No chest pain. However, he does reports dyspnea on exertion for the last 2 weeks. - Will check Echo.  - Continue IV Heparin. - Continue home Imdur 30mg  daily and Toprol-XL 50mg   daily. - Continue aspirin and statin. - Possible that this is due to demand ischemia in setting of multiple episodes of vomiting/ dehydration with underlying CAD. However, dyspnea could also be an anginal equivalent. Given ongoing confusion, I do not think we should take him to the cath lab this evening. Will check Echo and continue to trend troponin. We can consider cardiac catheterization on Monday depending on how he does over the weekend.  Dyspnea on Exertion Patient reports dyspnea on exertion over the last 2 weeks. He has been in  a wheelchair since fracturing his right femur in 10/2022 and reports new dyspnea on exertion when wheeling himself from one room to the other over the last 2 weeks.  - Euvolemic on exam. No other signs or symptoms of CHF. - Will check BNP. - Some of dyspnea may be due to deconditioning. However, this also may be an anginal equivalent. Will check Echo and consider cardiac catheterization on Monday as above.  Hypertension BP reasonably well controlled.  - Continue Imdur and Lopressor as above. - Also on Lasix 40mg  daily at home for leg swelling. Can hold this for now.  Hyperlipidemia - Will increase home Lipitor from 10mg  to 40mg  daily. - Will repeat lipid panel in the  morning.  Hypokalemia Potassium was 1.8 on arrival to [facility]. This was in the setting of multiple episodes of vomiting and Lasix. Aggressively repleted at [facility]. Most recent potassium 3.4.  - Continue to monitor.  Otherwise, per primary team: - Altered mental status - Vomiting - Type 2 diabetes mellitus - Anxiety/ depression - GERD  Risk Assessment/Risk Scores:   The patient's TIMI risk score is 5, which indicates a 26% risk of all cause mortality, new or recurrent myocardial infarction or need for urgent revascularization in the next 14 days.{   For questions or updates, please contact [facility] Please consult [email or web address] for contact info under    Signed, Corrin Parker, PA-C  04/09/2023 5:03 PM

## 2023-04-09 NOTE — Progress Notes (Signed)
Dr. Alinda Money made aware that patient arrived to floor with diarrhea and temp 100.7*F oral.  No cough, SOB or other symptoms reported per patient at this time.

## 2023-04-09 NOTE — H&P (Signed)
History and Physical   Michael Rose PIR:[id number] DOB: 10/08/1950 DOA: 04/09/2023  PCP: [name], MD   Patient coming from: [facility], then prior to that  Chief Complaint: Shortness of breath  HPI: Michael Rose is a 72 y.o. male with medical history significant of hypertension, hyperlipidemia, diabetes, CAD status post PCI, obesity, BPH, anxiety, depression presenting with shortness of breath.  Patient initially presented to [facility] with shortness of breath on 11/7.  Had some symptoms for about a week and had some nausea and vomiting with nonbloody none bilious emesis about twice a day.  No diarrhea, some mild constipation.  Denies significant chest pain.  Wife reports he had abnormal sensation in his temples earlier today which was a similar sensation to what he had during a previous heart attack.  Further denies fever, chills, abdominal pain.  Course: At outside facility vital signs are stable.  Awaiting verified vital signs here though pended/unconfirmed vital signs note possible fever to 100.7.  Lab workup at outside facility showed CMP with initial potassium of 1.8 after repletion most recent labs show improvement to 3.4, T. bili 1.3.  Magnesium 1.6 and 2.1 after repletion.  CBC with leukocytosis to 17.4 and improvement without intervention to 14.4 the next day.  Borderline lactic acid 2.1, normal lipase.  Troponin trend 313, 711, 1400, 1600, 1900, 2700.  Respiratory panel for flu COVID RSV negative.  Urinalysis with glucose and ketones only.  UDS with opiates only.  Ethanol level negative.  TSH normal.  Ammonia level negative.  A1c 5.9.  EKG with ST depressions.  Chest x-ray with mild vascular congestion and small right pleural effusion.  CT head showed no acute abnormality but did demonstrate chronic infarct.  Patient received aspirin, Nitropaste, Zofran, potassium and magnesium supplementation, IV fluids.  Initially received Lovenox and then plan was to transition to IV  heparin patient was transferred around the time this was supposed to occur.  Case was discussed with cardiology here who recommended above heparin infusion and transferred to [name] to be admitted by hospitalist service with cardiology to consult for NSTEMI.  Review of Systems: As per HPI otherwise all other systems reviewed and are negative.  Past Medical History:  Diagnosis Date   Allergic reaction to contrast dye    Bipolar affective disorder (HCC)    Coronary atherosclerosis of native coronary artery    BMS to RCA 2008, [facility], Dr. 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     Past Surgical History:  Procedure Laterality Date   CHOLECYSTECTOMY  2013   JOINT REPLACEMENT     ORIF FEMUR FRACTURE Right 11/20/2022   Procedure: OPEN REDUCTION INTERNAL FIXATION (ORIF) DISTAL FEMUR FRACTURE;  Surgeon: Roby Lofts, MD;  Location: [facility] OR;  Service: Orthopedics;  Laterality: Right;   TOTAL KNEE ARTHROPLASTY     Left   TOTAL KNEE ARTHROPLASTY Right 10/21/2012   Dr Madelon Lips   TOTAL KNEE ARTHROPLASTY Right 10/21/2012   Procedure: TOTAL KNEE ARTHROPLASTY;  Surgeon: Thera Flake., MD;  Location: [facility] OR;  Service: Orthopedics;  Laterality: Right;   VASECTOMY      Social History  reports that he has never smoked. He has never used smokeless  tobacco. He reports that he does not drink alcohol and does not use drugs.  Allergies  Allergen Reactions   Iodinated Contrast Media Hives and Itching   Penicillins Other (See Comments)    Unknown reaction in childhood    Family History  Problem Relation Age of Onset   Heart disease Mother    Heart disease Father   Reviewed on admission  Prior to Admission medications   Medication Sig Start Date End Date Taking? Authorizing  Provider  aspirin 81 MG tablet Take 81 mg by mouth daily.   Yes [provider]  atorvastatin (LIPITOR) 10 MG tablet Take 10 mg by mouth daily.   Yes [provider]  empagliflozin (JARDIANCE) 25 MG TABS tablet Take 25 mg by mouth daily.   Yes [provider]  fluvoxaMINE (LUVOX) 100 MG tablet Take 200 mg by mouth daily.   Yes [provider]  furosemide (LASIX) 40 MG tablet Take 40 mg by mouth daily. 02/17/23  Yes [provider]  HYDROcodone-acetaminophen (NORCO) 7.5-325 MG tablet Take 1 tablet by mouth every 6 (six) hours as needed for moderate pain (pain score 4-6) or severe pain (pain score 7-10).   Yes [provider]  IBU 800 MG tablet Take 800 mg by mouth every 8 (eight) hours as needed for headache or moderate pain. 10/30/22  Yes [provider]  Insulin Degludec (TRESIBA) 100 UNIT/ML SOLN Inject 14 Units into the skin at bedtime.   Yes [provider]  isosorbide mononitrate (IMDUR) 30 MG 24 hr tablet TAKE 1 TABLET BY MOUTH EVERY DAY Patient taking differently: Take 30 mg by mouth daily. 07/17/14  Yes Jonelle Sidle, MD  levocetirizine (XYZAL) 5 MG tablet Take 5 mg by mouth daily. 10/26/22  Yes [provider]  methocarbamol (ROBAXIN) 500 MG tablet Take 1 tablet (500 mg total) by mouth every 6 (six) hours as needed for muscle spasms. 11/23/22  Yes McClung, Sarah A, PA-C  acetaminophen (TYLENOL) 325 MG tablet Take 650 mg by mouth as needed.    [provider]  apixaban (ELIQUIS) 2.5 MG TABS tablet Take 1 tablet (2.5 mg total) by mouth 2 (two) times daily. 11/23/22 04/09/23  West Bali, PA-C  feeding supplement (ENSURE ENLIVE / ENSURE PLUS) LIQD Take 237 mLs by mouth 2 (two) times daily between meals. 11/24/22   Rodolph Bong, MD  metoprolol succinate (TOPROL-XL) 50 MG 24 hr tablet TAKE 1 TABLET BY MOUTH EVERY MORNING AND TAKE 1/2 TABLET EVERY EVENING - NEEDS APPOINTMENT Patient taking  differently: Take 75 mg by mouth daily. 03/07/19   Jonelle Sidle, MD  Multiple Vitamin (MULTIVITAMIN WITH MINERALS) TABS tablet Take 1 tablet by mouth daily. 11/25/22   Rodolph Bong, MD  NOVOLIN N 100 UNIT/ML injection Inject 10-25 Units into the skin See admin instructions. Inject 25 units with lunch, 10 units at bedtime. 10/13/22   [provider]  oxyCODONE (OXY IR/ROXICODONE) 5 MG immediate release tablet Take 1 tablet (5 mg total) by mouth every 4 (four) hours as needed for breakthrough pain or severe pain. 11/23/22   [place] Bali, PA-C  OZEMPIC, 1 MG/DOSE, 4 MG/3ML SOPN Inject 1 mg into the skin every Monday. 10/22/22   [provider]  pantoprazole (PROTONIX) 40 MG tablet Take 40 mg by mouth daily.    [provider]  polyethylene glycol (MIRALAX / GLYCOLAX) 17 g packet Take 17 g by mouth 2 (two) times daily. 11/24/22   Ramiro Harvest  V, MD  senna-docusate (SENOKOT-S) 8.6-50 MG tablet Take 1 tablet by mouth 2 (two) times daily. 11/24/22   Rodolph Bong, MD  tamsulosin (FLOMAX) 0.4 MG CAPS capsule Take 1 capsule (0.4 mg total) by mouth daily after supper. 04/20/22   Jerilee Field, MD  traZODone (DESYREL) 150 MG tablet Take 150 mg by mouth at bedtime.    [provider]  Vitamin D, Ergocalciferol, (DRISDOL) 1.25 MG (50000 UNIT) CAPS capsule Take 1 capsule (50,000 Units total) by mouth every 7 (seven) days. 11/28/22   West Bali, PA-C    Physical Exam: Vitals:   04/09/23 1520  BP: (!) (P) 132/92  Pulse: (P) 89  Resp: (P) 20  Temp: (!) (P) 100.7 F (38.2 C)  TempSrc: (P) Oral    Physical Exam  Labs on Admission: I have personally reviewed following labs and imaging studies  CBC: No results for input(s): "WBC", "NEUTROABS", "HGB", "HCT", "MCV", "PLT" in the last 168 hours.  Basic Metabolic Panel: No results for input(s): "NA", "K", "CL", "CO2", "GLUCOSE", "BUN", "CREATININE", "CALCIUM", "MG", "PHOS" in the last 168  hours.  GFR: CrCl cannot be calculated (Patient's most recent lab result is older than the maximum 21 days allowed.).  Liver Function Tests: No results for input(s): "AST", "ALT", "ALKPHOS", "BILITOT", "PROT", "ALBUMIN" in the last 168 hours.  Urine analysis:    Component Value Date/Time   COLORURINE YELLOW 10/13/2012 1209   APPEARANCEUR Clear 04/20/2022 1459   LABSPEC 1.017 10/13/2012 1209   PHURINE 6.5 10/13/2012 1209   GLUCOSEU 3+ (A) 04/20/2022 1459   HGBUR NEGATIVE 10/13/2012 1209   BILIRUBINUR Negative 04/20/2022 1459   KETONESUR NEGATIVE 10/13/2012 1209   PROTEINUR Negative 04/20/2022 1459   PROTEINUR NEGATIVE 10/13/2012 1209   UROBILINOGEN 1.0 10/13/2012 1209   NITRITE Negative 04/20/2022 1459   NITRITE NEGATIVE 10/13/2012 1209   LEUKOCYTESUR Negative 04/20/2022 1459    Radiological Exams on Admission: No results found.  EKG: Reportedly ST depressions at outside hospital EKG.  Unable to review.  Repeat ordered.  Assessment/Plan Principal Problem:   NSTEMI (non-ST elevated myocardial infarction) (HCC) Active Problems:   Mixed hyperlipidemia   MORBID OBESITY   Essential hypertension, benign   CORONARY ATHEROSCLEROSIS NATIVE CORONARY ARTERY   Type 2 diabetes mellitus without complication, without long-term current use of insulin (HCC)   Benign prostatic hyperplasia   Anxiety and depression   Leukocytosis   NSTEMI CAD > Known history of CAD with prior stent placement.  Presenting with shortness of breath and nausea.  Found to have troponin trend of 13, 711, 1400, 1600, 1900, 2700. > Received ASA, Nitropaste, Zofran, IV fluids at outside ED. > With worsening troponin, cardiology here was consulted and recommended admission to hospital service, heparin infusion, consulted on arrival. > Patient had received Lovenox at outside facility so heparin was not started and was due to start around 1 PM today but patient was transferred around that time so unclear if this was  initiated. > Also had hypomagnesemia and hyponatremia repleted as below. - Monitor on telemetry overnight - Appreciate cardiology recommendations and assistance - Start heparin infusion - Daily ASA - Echocardiogram - Intervention per cardiology, n.p.o. for now - Continue home Imdur, metoprolol, atorvastatin when able  Leukocytosis > Noted to have leukocytosis of 17.4 on arrival to outside ED, repeat today improved to 14.4 without intervention.  Chest x-ray there showed mild vascular congestion and small right pleural effusion but no infiltrate.  Urinalysis there showed only glucose and ketones.  Reportedly had a temperature of 100 degrees with EMS but none while outside hospital.  On validated/pended vital show possible temperature of 100.7 here. > Has had reactive leukocytosis in the past during previous admissions.  However, if truly febrile we will likely need to investigate further. > Negative flu COVID RSV screen at outside hospital.  Will get full respiratory viral panel and recheck chest x-ray. - Continue to trend fever curve and WBC - Chest x-ray - Full respiratory viral panel to evaluate for viral etiology of fever.  Hypertension - Continue home Imdur, metoprolol  Hyperlipidemia - Continue home atorvastatin  Diabetes > 14 units nightly and SSI outpatient - 5 units nightly, SSI  Obesity - Noted  Anxiety Depression - Continue home fluvoxamine and trazodone  DVT prophylaxis: Heparin Code Status:   Full Family Communication:  Updated at bedside Disposition Plan:   Patient is from:  [employer]  Anticipated DC to:  Home  Anticipated DC date:  2 to 4 days  Anticipated DC barriers: None  Consults called:  Cardiology Admission status:  Inpatient, telemetry  Severity of Illness: The appropriate patient status for this patient is INPATIENT. Inpatient status is judged to be reasonable and necessary in order to provide the required intensity of service to ensure  the patient's safety. The patient's presenting symptoms, physical exam findings, and initial radiographic and laboratory data in the context of their chronic comorbidities is felt to place them at high risk for further clinical deterioration. Furthermore, it is not anticipated that the patient will be medically stable for discharge from the hospital within 2 midnights of admission.   * I certify that at the point of admission it is my clinical judgment that the patient will require inpatient hospital care spanning beyond 2 midnights from the point of admission due to high intensity of service, high risk for further deterioration and high frequency of surveillance required.Synetta Fail MD [facility]  How to contact the [facility] Attending or Consulting provider 7A - 7P or covering provider during after hours 7P -7A, for this patient?   Check the care team in [facility] and look for a) attending/consulting [facility] provider listed and b) the [facility] team listed Log into [email or web address] and use [place]'s universal password to access. If you do not have the password, please contact the hospital operator. Locate the [facility] provider you are looking for under [facility] and page to a number that you can be directly reached. If you still have difficulty reaching the provider, please page the [facility] (Director on Call) for the Hospitalists listed on amion for assistance.  04/09/2023, 4:38 PM

## 2023-04-09 NOTE — Plan of Care (Signed)
  Problem: Clinical Measurements: Goal: Ability to maintain clinical measurements within normal limits will improve Outcome: Progressing Goal: Will remain free from infection Outcome: Progressing Goal: Diagnostic test results will improve Outcome: Progressing Goal: Respiratory complications will improve Outcome: Progressing Goal: Cardiovascular complication will be avoided Outcome: Progressing   Problem: Safety: Goal: Ability to remain free from injury will improve Outcome: Progressing   Problem: Cardiac: Goal: Ability to achieve and maintain adequate cardiovascular perfusion will improve Outcome: Progressing   Problem: Fluid Volume: Goal: Ability to maintain a balanced intake and output will improve Outcome: Progressing   Problem: Metabolic: Goal: Ability to maintain appropriate glucose levels will improve Outcome: Progressing   Problem: Nutritional: Goal: Maintenance of adequate nutrition will improve Outcome: Progressing Goal: Progress toward achieving an optimal weight will improve Outcome: Progressing   Problem: Skin Integrity: Goal: Risk for impaired skin integrity will decrease Outcome: Progressing   Problem: Tissue Perfusion: Goal: Adequacy of tissue perfusion will improve Outcome: Progressing

## 2023-04-09 NOTE — Plan of Care (Signed)
Update on [name] accepted 11/7 by Dr. Lazarus Salines( check email).  Transfer from [facility].  Noted to have NSTEMI with troponin elevated from 313->2853 and delta Trop 931.  Dr. Geraldo Pitter of cardiology had been been consulted.  Recommended hospitalist admission.

## 2023-04-10 ENCOUNTER — Encounter (HOSPITAL_COMMUNITY): Payer: Self-pay | Admitting: Internal Medicine

## 2023-04-10 ENCOUNTER — Inpatient Hospital Stay (HOSPITAL_COMMUNITY): Payer: PPO

## 2023-04-10 ENCOUNTER — Other Ambulatory Visit: Payer: Self-pay

## 2023-04-10 DIAGNOSIS — F419 Anxiety disorder, unspecified: Secondary | ICD-10-CM | POA: Diagnosis not present

## 2023-04-10 DIAGNOSIS — I214 Non-ST elevation (NSTEMI) myocardial infarction: Secondary | ICD-10-CM | POA: Diagnosis not present

## 2023-04-10 DIAGNOSIS — I10 Essential (primary) hypertension: Secondary | ICD-10-CM | POA: Diagnosis not present

## 2023-04-10 DIAGNOSIS — N400 Enlarged prostate without lower urinary tract symptoms: Secondary | ICD-10-CM | POA: Diagnosis not present

## 2023-04-10 LAB — LIPID PANEL
Cholesterol: 111 mg/dL (ref 0–200)
HDL: 41 mg/dL (ref >40)
LDL Cholesterol: 51 mg/dL (ref 0–99)
Total CHOL/HDL Ratio: 2.7 {ratio}
Triglycerides: 94 mg/dL (ref <150)
VLDL: 19 mg/dL (ref 0–40)

## 2023-04-10 LAB — BASIC METABOLIC PANEL
Anion gap: 10 (ref 5–15)
BUN: 26 mg/dL — ABNORMAL HIGH (ref 8–23)
CO2: 23 mmol/L (ref 22–32)
Calcium: 8.2 mg/dL — ABNORMAL LOW (ref 8.9–10.3)
Chloride: 103 mmol/L (ref 98–111)
Creatinine, Ser: 0.97 mg/dL (ref 0.61–1.24)
GFR, Estimated: >60 mL/min (ref >60)
Glucose, Bld: 128 mg/dL — ABNORMAL HIGH (ref 70–99)
Potassium: 3.4 mmol/L — ABNORMAL LOW (ref 3.5–5.1)
Sodium: 136 mmol/L (ref 135–145)

## 2023-04-10 LAB — ECHOCARDIOGRAM COMPLETE
AR max vel: 1.91 cm2
AV Area VTI: 1.89 cm2
AV Area mean vel: 1.82 cm2
AV Mean grad: 6 mm[Hg]
AV Peak grad: 10.6 mm[Hg]
Ao pk vel: 1.63 m/s
Area-P 1/2: 4.29 cm2
Height: 71 in
S' Lateral: 4.3 cm
Weight: 3435.2 [oz_av]

## 2023-04-10 LAB — GLUCOSE, CAPILLARY
Glucose-Capillary: 137 mg/dL — ABNORMAL HIGH (ref 70–99)
Glucose-Capillary: 137 mg/dL — ABNORMAL HIGH (ref 70–99)
Glucose-Capillary: 188 mg/dL — ABNORMAL HIGH (ref 70–99)
Glucose-Capillary: 153 mg/dL — ABNORMAL HIGH (ref 70–99)

## 2023-04-10 LAB — HEPARIN LEVEL (UNFRACTIONATED)
Heparin Unfractionated: 0.16 [IU]/mL — ABNORMAL LOW (ref 0.30–0.70)
Heparin Unfractionated: 0.21 [IU]/mL — ABNORMAL LOW (ref 0.30–0.70)

## 2023-04-10 MED ORDER — MELATONIN 5 MG PO TABS: 10.0000 mg | ORAL_TABLET | Freq: Every day | ORAL | Status: DC

## 2023-04-10 MED ORDER — MELATONIN 5 MG PO TABS
10.0000 mg | ORAL_TABLET | Freq: Every day | ORAL | Status: DC
  Administered 2023-04-10 – 2023-04-21 (×13): 10 mg via ORAL
  Filled 2023-04-10 (×13): qty 2

## 2023-04-10 MED ORDER — INSULIN ASPART 100 UNIT/ML IJ SOLN
0.0000 [IU] | Freq: Three times a day (TID) | INTRAMUSCULAR | Status: DC
  Administered 2023-04-10: 2 [IU] via SUBCUTANEOUS
  Administered 2023-04-10: 3 [IU] via SUBCUTANEOUS
  Administered 2023-04-10 – 2023-04-12 (×5): 2 [IU] via SUBCUTANEOUS
  Administered 2023-04-13: 5 [IU] via SUBCUTANEOUS
  Administered 2023-04-13: 3 [IU] via SUBCUTANEOUS
  Administered 2023-04-13 – 2023-04-14 (×2): 2 [IU] via SUBCUTANEOUS
  Administered 2023-04-14 (×2): 3 [IU] via SUBCUTANEOUS
  Administered 2023-04-15 (×2): 2 [IU] via SUBCUTANEOUS
  Administered 2023-04-15: 5 [IU] via SUBCUTANEOUS
  Administered 2023-04-16: 2 [IU] via SUBCUTANEOUS
  Administered 2023-04-16: 3 [IU] via SUBCUTANEOUS
  Administered 2023-04-16 – 2023-04-17 (×2): 2 [IU] via SUBCUTANEOUS
  Administered 2023-04-17: 3 [IU] via SUBCUTANEOUS
  Administered 2023-04-17 – 2023-04-22 (×13): 2 [IU] via SUBCUTANEOUS
  Administered 2023-04-22: 3 [IU] via SUBCUTANEOUS

## 2023-04-10 MED ORDER — INSULIN ASPART 100 UNIT/ML IJ SOLN: 0.0000 [IU] | Freq: Every day | INTRAMUSCULAR | Status: DC

## 2023-04-10 MED ORDER — POLYETHYLENE GLYCOL 3350 17 G PO PACK
17.0000 g | PACK | Freq: Every day | ORAL | Status: DC | PRN
  Administered 2023-04-22: 17 g via ORAL
  Filled 2023-04-10: qty 1

## 2023-04-10 MED ORDER — METOPROLOL SUCCINATE ER 100 MG PO TB24
100.0000 mg | ORAL_TABLET | Freq: Every day | ORAL | Status: DC
  Administered 2023-04-11 – 2023-04-22 (×11): 100 mg via ORAL
  Filled 2023-04-10 (×13): qty 1

## 2023-04-10 MED ORDER — HYDROCODONE-ACETAMINOPHEN 7.5-325 MG PO TABS
1.0000 | ORAL_TABLET | Freq: Four times a day (QID) | ORAL | Status: DC | PRN
  Administered 2023-04-10 – 2023-04-22 (×30): 1 via ORAL
  Filled 2023-04-10 (×33): qty 1

## 2023-04-10 MED ORDER — HEPARIN (PORCINE) 25000 UT/250ML-% IV SOLN
1950.0000 [IU]/h | INTRAVENOUS | Status: DC
  Administered 2023-04-11: 1900 [IU]/h via INTRAVENOUS
  Administered 2023-04-11: 1600 [IU]/h via INTRAVENOUS
  Administered 2023-04-12: 1950 [IU]/h via INTRAVENOUS
  Filled 2023-04-10 (×3): qty 250

## 2023-04-10 MED ORDER — INSULIN ASPART 100 UNIT/ML IJ SOLN
4.0000 [IU] | Freq: Three times a day (TID) | INTRAMUSCULAR | Status: DC
  Administered 2023-04-11 – 2023-04-22 (×30): 4 [IU] via SUBCUTANEOUS

## 2023-04-10 MED ORDER — METOPROLOL SUCCINATE ER 50 MG PO TB24
50.0000 mg | ORAL_TABLET | Freq: Once | ORAL | Status: AC
Start: 2023-04-10 — End: 2023-04-10
  Administered 2023-04-10: 50 mg via ORAL
  Filled 2023-04-10: qty 1

## 2023-04-10 MED ORDER — PERFLUTREN LIPID MICROSPHERE
3.0000 mL | INTRAVENOUS | Status: AC | PRN
  Administered 2023-04-10: 3 mL via INTRAVENOUS

## 2023-04-10 MED ORDER — QUETIAPINE FUMARATE 25 MG PO TABS
25.0000 mg | ORAL_TABLET | Freq: Every day | ORAL | Status: DC
  Administered 2023-04-10 – 2023-04-18 (×9): 25 mg via ORAL
  Filled 2023-04-10 (×9): qty 1

## 2023-04-10 MED ORDER — HYDROMORPHONE HCL 1 MG/ML IJ SOLN
0.5000 mg | Freq: Once | INTRAMUSCULAR | Status: AC
  Administered 2023-04-10: 0.5 mg via INTRAVENOUS
  Filled 2023-04-10: qty 1

## 2023-04-10 NOTE — Progress Notes (Signed)
DAILY PROGRESS NOTE   Patient Name: Michael Rose Date of Encounter: 04/10/2023 Cardiologist: None  Chief Complaint   No chest pain overnight  Patient Profile   Michael Rose is a 72 y.o. male with a history of CAD with MI in 2008 s/p BMS to RCA, hypertension, hyperlipidemia, type 2 diabetes mellitus, GERD, anxiety/ depression, and bipolar affective disorder who is being seen 04/09/2023 for the evaluation of NSTEMI at the request of Dr. Geraldo Pitter.   Subjective   No chest pain overnight- troponin peaked at 2750. On IV heparin. Echo pending today, but plans for [facility] on Monday. LDL is low at 51. Main complaints are hand and leg pain.   Objective   Vitals:   04/09/23 2345 04/10/23 0533 04/10/23 0820 04/10/23 1117  BP: 114/67 129/71 110/66 112/64  Pulse: (!) 102 96 95   Resp: 19 16 16 18   Temp: 98.9 F (37.2 C) 100.3 F (37.9 C) 99.1 F (37.3 C) 98.2 F (36.8 C)  TempSrc: Oral Oral Oral   SpO2: 97% 93% 97%   Weight:  97.4 kg    Height:  5\' 11"  (1.803 m)      Intake/Output Summary (Last 24 hours) at 04/10/2023 1143 Last data filed at 04/10/2023 0534 Gross per 24 hour  Intake 620 ml  Output 650 ml  Net -30 ml   Filed Weights   04/10/23 0533  Weight: 97.4 kg    Physical Exam   General appearance: alert and no distress Neck: no carotid bruit, no JVD, and thyroid not enlarged, symmetric, no tenderness/mass/nodules Lungs: clear to auscultation bilaterally Heart: regular rate and rhythm Abdomen: soft, non-tender; bowel sounds normal; no masses,  no organomegaly Extremities: extremities normal, atraumatic, no cyanosis or edema Pulses: 2+ and symmetric Skin: Skin color, texture, turgor normal. No rashes or lesions Neurologic: Grossly normal Psych: Pleasant  Inpatient Medications    Scheduled Meds:  aspirin EC  81 mg Oral Daily   atorvastatin  40 mg Oral Daily   fluvoxaMINE  200 mg Oral Daily   furosemide  40 mg Oral Daily   insulin aspart  0-15 Units  Subcutaneous TID WC   insulin aspart  0-5 Units Subcutaneous QHS   insulin aspart  4 Units Subcutaneous TID WC   insulin glargine-yfgn  5 Units Subcutaneous QHS   isosorbide mononitrate  30 mg Oral Daily   melatonin  10 mg Oral QHS   metoprolol succinate  50 mg Oral Daily   pantoprazole  40 mg Oral Daily   traZODone  150 mg Oral QHS    Continuous Infusions:  heparin 1,400 Units/hr (04/10/23 1110)    PRN Meds: acetaminophen, HYDROcodone-acetaminophen, nitroGLYCERIN, ondansetron (ZOFRAN) IV, polyethylene glycol   Labs   Results for orders placed or performed during the hospital encounter of 04/09/23 (from the past 48 hour(s))  Glucose, capillary     Status: None   Collection Time: 04/09/23  4:47 PM  Result Value Ref Range   Glucose-Capillary 91 70 - 99 mg/dL    Comment: Glucose reference range applies only to samples taken after fasting for at least 8 hours.  Respiratory (~20 pathogens) panel by PCR     Status: None   Collection Time: 04/09/23  5:43 PM   Specimen: Nasopharyngeal Swab; Respiratory  Result Value Ref Range   Adenovirus NOT DETECTED NOT DETECTED   Coronavirus 229E NOT DETECTED NOT DETECTED    Comment: (NOTE) The Coronavirus on the Respiratory Panel, DOES NOT test for the novel  Coronavirus (2019 nCoV)    Coronavirus HKU1 NOT DETECTED NOT DETECTED   Coronavirus NL63 NOT DETECTED NOT DETECTED   Coronavirus OC43 NOT DETECTED NOT DETECTED   Metapneumovirus NOT DETECTED NOT DETECTED   Rhinovirus / Enterovirus NOT DETECTED NOT DETECTED   Influenza A NOT DETECTED NOT DETECTED   Influenza B NOT DETECTED NOT DETECTED   Parainfluenza Virus 1 NOT DETECTED NOT DETECTED   Parainfluenza Virus 2 NOT DETECTED NOT DETECTED   Parainfluenza Virus 3 NOT DETECTED NOT DETECTED   Parainfluenza Virus 4 NOT DETECTED NOT DETECTED   Respiratory Syncytial Virus NOT DETECTED NOT DETECTED   Bordetella pertussis NOT DETECTED NOT DETECTED   Bordetella Parapertussis NOT DETECTED NOT  DETECTED   Chlamydophila pneumoniae NOT DETECTED NOT DETECTED   Mycoplasma pneumoniae NOT DETECTED NOT DETECTED    Comment: Performed at [facility], [address] [place], [place] [address]  Basic metabolic panel     Status: Abnormal   Collection Time: 04/09/23  5:47 PM  Result Value Ref Range   Sodium 141 135 - 145 mmol/L   Potassium 3.5 3.5 - 5.1 mmol/L   Chloride 102 98 - 111 mmol/L   CO2 27 22 - 32 mmol/L   Glucose, Bld 111 (H) 70 - 99 mg/dL    Comment: Glucose reference range applies only to samples taken after fasting for at least 8 hours.   BUN 24 (H) 8 - 23 mg/dL   Creatinine, Ser 6.04 0.61 - 1.24 mg/dL   Calcium 8.6 (L) 8.9 - 10.3 mg/dL   GFR, Estimated >54 >09 mL/min    Comment: (NOTE) Calculated using the CKD-EPI Creatinine Equation (2021)    Anion gap 12 5 - 15    Comment: Performed at [facility], [address] [place], [place] [address]  Magnesium     Status: None   Collection Time: 04/09/23  5:47 PM  Result Value Ref Range   Magnesium 2.1 1.7 - 2.4 mg/dL    Comment: Performed at [facility], [address], [place], [place] [address]  CBC with Differential/Platelet     Status: Abnormal   Collection Time: 04/09/23  5:47 PM  Result Value Ref Range   WBC 9.9 4.0 - 10.5 K/uL   RBC 5.16 4.22 - 5.81 MIL/uL   Hemoglobin 12.9 (L) 13.0 - 17.0 g/dL   HCT 56.2 13.0 - 86.5 %   MCV 78.5 (L) 80.0 - 100.0 fL   MCH 25.0 (L) 26.0 - 34.0 pg   MCHC 31.9 30.0 - 36.0 g/dL   RDW 78.4 (H) 69.6 - 29.5 %   Platelets 141 (L) 150 - 400 K/uL   nRBC 0.0 0.0 - 0.2 %   Neutrophils Relative % 86 %   Neutro Abs 8.4 (H) 1.7 - 7.7 K/uL   Lymphocytes Relative 6 %   Lymphs Abs 0.6 (L) 0.7 - 4.0 K/uL   Monocytes Relative 8 %   Monocytes Absolute 0.8 0.1 - 1.0 K/uL   Eosinophils Relative 0 %   Eosinophils Absolute 0.0 0.0 - 0.5 K/uL   Basophils Relative 0 %   Basophils Absolute 0.0 0.0 - 0.1 K/uL   Immature Granulocytes 0 %   Abs Immature Granulocytes 0.04 0.00 -  0.07 K/uL    Comment: Performed at [facility], [address], [place], [place] [address]  Heparin level (unfractionated)     Status: None   Collection Time: 04/09/23  5:47 PM  Result Value Ref Range  Heparin Unfractionated 0.30 0.30 - 0.70 IU/mL    Comment: (NOTE) The clinical reportable range upper limit is being lowered to >1.10 to align with the FDA approved guidance for the current laboratory assay.  If heparin results are below expected values, and patient dosage has  been confirmed, suggest follow up testing of antithrombin III levels. Performed at [facility], [address] [place], [place] [address]   APTT     Status: Abnormal   Collection Time: 04/09/23  5:47 PM  Result Value Ref Range   aPTT 58 (H) 24 - 36 seconds    Comment:        IF BASELINE aPTT IS ELEVATED, SUGGEST PATIENT RISK ASSESSMENT BE USED TO DETERMINE APPROPRIATE ANTICOAGULANT THERAPY. Performed at [facility], [address] [place], [place] [address]   Glucose, capillary     Status: Abnormal   Collection Time: 04/09/23  9:30 PM  Result Value Ref Range   Glucose-Capillary 133 (H) 70 - 99 mg/dL    Comment: Glucose reference range applies only to samples taken after fasting for at least 8 hours.  Basic metabolic panel     Status: Abnormal   Collection Time: 04/10/23  7:02 AM  Result Value Ref Range   Sodium 136 135 - 145 mmol/L   Potassium 3.4 (L) 3.5 - 5.1 mmol/L   Chloride 103 98 - 111 mmol/L   CO2 23 22 - 32 mmol/L   Glucose, Bld 128 (H) 70 - 99 mg/dL    Comment: Glucose reference range applies only to samples taken after fasting for at least 8 hours.   BUN 26 (H) 8 - 23 mg/dL   Creatinine, Ser 0.86 0.61 - 1.24 mg/dL   Calcium 8.2 (L) 8.9 - 10.3 mg/dL   GFR, Estimated >57 >84 mL/min    Comment: (NOTE) Calculated using the CKD-EPI Creatinine Equation (2021)    Anion gap 10 5 - 15    Comment: Performed at [facility], [address], [place], [place] [address]   Lipid panel     Status: None   Collection Time: 04/10/23  7:02 AM  Result Value Ref Range   Cholesterol 111 0 - 200 mg/dL   Triglycerides 94 <528 mg/dL   HDL 41 >41 mg/dL   Total CHOL/HDL Ratio 2.7 RATIO   VLDL 19 0 - 40 mg/dL   LDL Cholesterol 51 0 - 99 mg/dL    Comment:        Total Cholesterol/HDL:CHD Risk Coronary Heart Disease Risk Table                     Men   Women  1/2 Average Risk   3.4   3.3  Average Risk       5.0   4.4  2 X Average Risk   9.6   7.1  3 X Average Risk  23.4   11.0        Use the calculated Patient Ratio above and the CHD Risk Table to determine the patient's CHD Risk.        ATP III CLASSIFICATION (LDL):  <100     mg/dL   Optimal  324-401  mg/dL   Near or Above                    Optimal  130-159  mg/dL   Borderline  027-253  mg/dL   High  >664     mg/dL  Very High Performed at [facility], [address], [place], [place] [address]   Heparin level (unfractionated)     Status: Abnormal   Collection Time: 04/10/23  7:43 AM  Result Value Ref Range   Heparin Unfractionated 0.16 (L) 0.30 - 0.70 IU/mL    Comment: (NOTE) The clinical reportable range upper limit is being lowered to >1.10 to align with the FDA approved guidance for the current laboratory assay.  If heparin results are below expected values, and patient dosage has  been confirmed, suggest follow up testing of antithrombin III levels. Performed at [facility], [address], [place], [place] [address]   Glucose, capillary     Status: Abnormal   Collection Time: 04/10/23  8:00 AM  Result Value Ref Range   Glucose-Capillary 137 (H) 70 - 99 mg/dL    Comment: Glucose reference range applies only to samples taken after fasting for at least 8 hours.  Glucose, capillary     Status: Abnormal   Collection Time: 04/10/23 11:19 AM  Result Value Ref Range   Glucose-Capillary 137 (H) 70 - 99 mg/dL    Comment: Glucose reference range applies only to samples taken after  fasting for at least 8 hours.    ECG   Sinus rhythm with inferior and lateral ST segment depression - Personally Reviewed  Telemetry   SInus rhythm and sinus tach - Personally Reviewed  Radiology    DG CHEST PORT 1 VIEW  Result Date: 04/09/2023 CLINICAL DATA:  Fever EXAM: PORTABLE CHEST 1 VIEW COMPARISON:  Chest x-ray 04/08/2023 FINDINGS: Small right pleural effusion has mildly increased. The left lung is clear. There is no pneumothorax. The cardiomediastinal silhouette appears stable. The osseous structures are unchanged. IMPRESSION: Small right pleural effusion has mildly increased. Electronically Signed   By: Darliss Cheney M.D.   On: 04/09/2023 22:29    Cardiac Studies   Echo pending  Assessment   Principal Problem:   NSTEMI (non-ST elevated myocardial infarction) (HCC) Active Problems:   Mixed hyperlipidemia   MORBID OBESITY   Essential hypertension, benign   CORONARY ATHEROSCLEROSIS NATIVE CORONARY ARTERY   Type 2 diabetes mellitus without complication, without long-term current use of insulin (HCC)   Benign prostatic hyperplasia   Anxiety and depression   Leukocytosis   Plan   He reports no chest pain - event prior to admission. Not sure what lead to testing for troponin, but it is significantly elevated. EKG suggests inferior and lateral ischemia. Agree with plans for [facility] on Monday. He is on IV heparin. Will follow daily 12 lead EKGs - no ST elevation. Will increase metoprolol XL to 100 mg daily - give extra 50 mg today.   Time Spent Directly with Patient:  I have spent a total of 35 minutes with the patient reviewing hospital notes, telemetry, EKGs, labs and examining the patient as well as establishing an assessment and plan that was discussed personally with the patient.  > 50% of time was spent in direct patient care.  Length of Stay:  LOS: 1 day   Chrystie Nose, MD, [facility], FACP  [facility]  Medical Director of the Advanced Lipid  Disorders &  Cardiovascular Risk Reduction Clinic Diplomate of the American Board of Clinical Lipidology Attending Cardiologist  Direct Dial: [phone]  Fax: [phone]  Website:  [email or web address] Nicely Byrne Capek 04/10/2023, 11:43 AM

## 2023-04-10 NOTE — Evaluation (Signed)
Physical Therapy Evaluation Patient Details Name: Michael Rose MRN: [id number] DOB: [birth year] Today's Date: 04/10/2023  History of Present Illness  Pt is 72 year old presented to [facility] on  04/09/23 from [facility] for likely viral gastroenteritis and encephalopathy in setting on profound hypokalemia. Pt with elevated troponin and unclear if NSTEMI vs demand ischemia. Cardiology consulted and will plan for cardiac cath possibly 11/11.  PMH - rt femur fx with ORIF 10/2022, CAD s/p stenting in 2008, bil TKR, bipolar, HTN, IIDM, anxiety/depression.  Clinical Impression  Pt admitted with above diagnosis and presents to PT with functional limitations due to deficits listed below (See PT problem list). Pt needs skilled PT to maximize independence and safety. Pt lives with his wife and reports history of falls. Today pt is very anxious and difficulty attending to task at hand. Needed 2 people to stand from bed. Incontinent of stool and unaware. At this level question if wife can manage at home with him. Patient will benefit from continued inpatient follow up therapy, <3 hours/day           If plan is discharge home, recommend the following: A lot of help with walking and/or transfers;A lot of help with bathing/dressing/bathroom;Assistance with cooking/housework;Assist for transportation   Can travel by private vehicle   No    Equipment Recommendations None recommended by PT  Recommendations for Other Services       Functional Status Assessment Patient has had a recent decline in their functional status and demonstrates the ability to make significant improvements in function in a reasonable and predictable amount of time.     Precautions / Restrictions Precautions Precautions: Fall;Other (comment) Precaution Comments: very anxious Restrictions Weight Bearing Restrictions: No      Mobility  Bed Mobility Overal bed mobility: Needs Assistance Bed Mobility: Supine to Sit, Sit to Supine      Supine to sit: Contact guard, HOB elevated, Used rails Sit to supine: Supervision   General bed mobility comments: Incr time and use of rails    Transfers Overall transfer level: Needs assistance Equipment used: Rolling walker (2 wheels), Ambulation equipment used Transfers: Sit to/from Stand Sit to Stand: +2 physical assistance, Mod assist, From elevated surface           General transfer comment: Assist to power up. Did better with use of Stedy than walker Transfer via Lift Equipment: Stedy  Ambulation/Gait             Pre-gait activities: Stood with walker <5 sec with +2 mod assist. Stood with Stedy x 60 sec with min assist for [employer] Mobility     Tilt Bed    Modified Rankin (Stroke Patients Only)       Balance Overall balance assessment: Needs assistance Sitting-balance support: No upper extremity supported, Feet supported Sitting balance-Leahy Scale: Fair     Standing balance support: Bilateral upper extremity supported, During functional activity, Reliant on assistive device for balance Standing balance-Leahy Scale: Poor Standing balance comment: Stedy and min assist                             Pertinent Vitals/Pain Pain Assessment Pain Assessment: 0-10 Pain Score: 10-Worst pain ever Pain Location: lt foot Pain Intervention(s): Limited activity within patient's tolerance, Repositioned, Monitored during session    Home Living Family/patient expects to be discharged to:: Private residence  Living Arrangements: Spouse/significant other Available Help at Discharge: Family Type of Home: Apartment Home Access: Stairs to enter   Entrance Stairs-Number of Steps: 3   Home Layout: One level Home Equipment: Agricultural consultant (2 wheels);Cane - single point;Wheelchair - manual      Prior Function Prior Level of Function : Independent/Modified Independent             Mobility Comments: rollator  at home, doesn't go out but if does uses w/c       Extremity/Trunk Assessment   Upper Extremity Assessment Upper Extremity Assessment: Defer to OT evaluation    Lower Extremity Assessment Lower Extremity Assessment: Generalized weakness       Communication   Communication Communication: Hearing impairment  Cognition Arousal: Alert Behavior During Therapy: Anxious Overall Cognitive Status: No family/caregiver present to determine baseline cognitive functioning                                 General Comments: Pt with poor attention and very anxious        General Comments General comments (skin integrity, edema, etc.): SpO2 >92% throughout on RA, HR 120's with activity    Exercises     Assessment/Plan    PT Assessment Patient needs continued PT services  PT Problem List Decreased strength;Decreased activity tolerance;Decreased balance;Decreased mobility;Decreased cognition;Pain       PT Treatment Interventions DME instruction;Gait training;Functional mobility training;Therapeutic activities;Therapeutic exercise;Balance training;Patient/family education    PT Goals (Current goals can be found in the Care Plan section)  Acute Rehab PT Goals Patient Stated Goal: not stated PT Goal Formulation: With patient Time For Goal Achievement: 04/24/23 Potential to Achieve Goals: Good    Frequency Min 1X/week     Co-evaluation               AM-PAC PT "6 Clicks" Mobility  Outcome Measure Help needed turning from your back to your side while in a flat bed without using bedrails?: A Little Help needed moving from lying on your back to sitting on the side of a flat bed without using bedrails?: A Little Help needed moving to and from a bed to a chair (including a wheelchair)?: Total Help needed standing up from a chair using your arms (e.g., wheelchair or bedside chair)?: Total Help needed to walk in hospital room?: Total Help needed climbing 3-5 steps  with a railing? : Total 6 Click Score: 10    End of Session Equipment Utilized During Treatment: Gait belt Activity Tolerance: Patient limited by fatigue;Other (comment) (anxiety) Patient left: in bed;with call bell/phone within reach;with bed alarm set Nurse Communication: Mobility status;Need for lift equipment PT Visit Diagnosis: Unsteadiness on feet (R26.81);Other abnormalities of gait and mobility (R26.89);Repeated falls (R29.6);Muscle weakness (generalized) (M62.81);Pain Pain - Right/Left: Left Pain - part of body: Ankle and joints of foot;Leg    Time: 1011-1036 PT Time Calculation (min) (ACUTE ONLY): 25 min   Charges:   PT Evaluation $PT Eval Moderate Complexity: 1 Mod PT Treatments $Therapeutic Activity: 8-22 mins PT General Charges $$ ACUTE PT VISIT: 1 Visit         [facility] PT Acute Rehabilitation Services Office [phone]   Angelina Ok [facility] 04/10/2023, 11:21 AM

## 2023-04-10 NOTE — Evaluation (Signed)
Occupational Therapy Evaluation Patient Details Name: Michael Rose MRN: [id number] DOB: 09/18/1950 Today's Date: 04/10/2023   History of Present Illness Pt is 72 year old presented to [facility] on  04/09/23 from [facility] for likely viral gastroenteritis and encephalopathy in setting on profound hypokalemia. Pt with elevated troponin and unclear if NSTEMI vs demand ischemia. Cardiology consulted and will plan for cardiac cath possibly 11/11.  PMH - rt femur fx with ORIF 10/2022, CAD s/p stenting in 2008, bil TKR, bipolar, HTN, IIDM, anxiety/depression.   Clinical Impression   At baseline, pt completes ADLs Independent to Contact guard assist and performs functional mobility Mod I with a RW or manual wheelchair. Pt now presents with decreased activity tolerance, decreased B hand strength, decreased B UE fine motor coordination, numbness on ulnar side of Right hand, pain affecting functional level, decreased balance during functional tasks, mildly impaired cognition, signs of increased anxiety with movement, and decreased safety and independence with functional tasks. Pt currently demonstrates ability to complete UB ADLs with Set up to Min assist, LB ADLs with Max assist, and bed mobility during/in preparation for functional tasks with Supervision to Contact guard assist. Pt will benefit from acute skilled OT services to address deficits outlined below, decrease caregiver burden, and increase safety and independence with functional tasks. Post acute discharge, pt will benefit from intensive inpatient skilled rehab services < 3 hours per day to maximize rehab potential.       If plan is discharge home, recommend the following: Two people to help with walking and/or transfers;A lot of help with bathing/dressing/bathroom;Assistance with cooking/housework;Assist for transportation;Help with stairs or ramp for entrance;Assistance with feeding;Direct supervision/assist for medications management;Direct  supervision/assist for financial management (set up for self feeding)    Functional Status Assessment  Patient has had a recent decline in their functional status and demonstrates the ability to make significant improvements in function in a reasonable and predictable amount of time.  Equipment Recommendations  Other (comment) (defer to next level of care)    Recommendations for Other Services       Precautions / Restrictions Precautions Precautions: Fall;Other (comment) Precaution Comments: very anxious Restrictions Weight Bearing Restrictions: No      Mobility Bed Mobility Overal bed mobility: Needs Assistance Bed Mobility: Supine to Sit, Sit to Supine, Rolling Rolling: Contact guard assist   Supine to sit: Contact guard, HOB elevated, Used rails Sit to supine: Supervision   General bed mobility comments: Incr time and use of rails    Transfers Overall transfer level: Needs assistance                 General transfer comment: Pt declined functional trasnfers this session secondary to pain in L LE following PT session. During PT session earlier this day, pt performed sit<->stand transfer with a RW with Mod assist +2.      Balance Overall balance assessment: Needs assistance Sitting-balance support: No upper extremity supported, Feet supported Sitting balance-Leahy Scale: Fair                                     ADL either performed or assessed with clinical judgement   ADL Overall ADL's : Needs assistance/impaired Eating/Feeding: Set up;Sitting   Grooming: Sitting;Contact guard assist;Cueing for compensatory techniques;Cueing for sequencing (with increased time)   Upper Body Bathing: Minimal assistance;Sitting;Cueing for sequencing;Cueing for compensatory techniques (with increased time) Upper Body Bathing Details (indicate cue type  and reason): simulated Lower Body Bathing: Maximal assistance;Sitting/lateral leans;Cueing for  sequencing;Cueing for compensatory techniques (with increased time) Lower Body Bathing Details (indicate cue type and reason): simulated Upper Body Dressing : Minimal assistance;Cueing for sequencing;Sitting (with increased time)   Lower Body Dressing: Maximal assistance;Sitting/lateral leans;Cueing for safety;Cueing for sequencing;Cueing for compensatory techniques (with increased time)     Toilet Transfer Details (indicate cue type and reason): pt declined due to L LE pain following PT session Toileting- Clothing Manipulation and Hygiene: Maximal assistance;Bed level Toileting - Clothing Manipulation Details (indicate cue type and reason): simulated       General ADL Comments: Pt with decerased activity tolerance, pain, and signs of anxiety affecting functional level.     Vision Baseline Vision/History: 1 Wears glasses Ability to See in Adequate Light: 0 Adequate Patient Visual Report: No change from baseline       Perception         Praxis         Pertinent Vitals/Pain Pain Assessment Pain Assessment: 0-10 Pain Score: 9  Pain Location: L foot and coccyx Pain Descriptors / Indicators: Aching, Discomfort, Grimacing, Guarding Pain Intervention(s): Limited activity within patient's tolerance, Monitored during session, Repositioned     Extremity/Trunk Assessment Upper Extremity Assessment Upper Extremity Assessment: Right hand dominant;RUE deficits/detail;LUE deficits/detail RUE Deficits / Details: Hand strength 4-/5, all other strength largerly 4+/5; ROM WFL; decreased fine motor coordination; pt reports numbness on ulnar side of hand since recent fall when he landed on his hand - MD aware RUE Sensation: decreased light touch (numbness; on ulnar side) RUE Coordination: decreased fine motor LUE Deficits / Details: Hand strength 4-/5, all other strength largerly 4+/5; ROM WFL; decreased fine motor coordination LUE Sensation: WNL LUE Coordination: decreased fine motor    Lower Extremity Assessment Lower Extremity Assessment: Defer to PT evaluation       Communication Communication Communication: Hearing impairment Cueing Techniques: Visual cues   Cognition Arousal: Alert Behavior During Therapy: Anxious, WFL for tasks assessed/performed (largely WFL in supine; anxious with bed mobility and transfers) Overall Cognitive Status: No family/caregiver present to determine baseline cognitive functioning Area of Impairment: Attention, Safety/judgement, Awareness, Problem solving, Following commands                   Current Attention Level: Sustained   Following Commands: Follows one step commands consistently, Follows one step commands with increased time Safety/Judgement: Decreased awareness of safety, Decreased awareness of deficits Awareness: Emergent Problem Solving: Slow processing, Difficulty sequencing       General Comments  HR in the upper 90s to low 120s throughout session. O2 sat >/92% on RA throughout session. MD present during a portion of session    Exercises     Shoulder Instructions      Home Living Family/patient expects to be discharged to:: Private residence Living Arrangements: Spouse/significant other Available Help at Discharge: Family;Available 24 hours/day;Available PRN/intermittently (daughter and two sons; all work) Type of Home: Apartment Home Access: Stairs to enter Secretary/administrator of Steps: 3 Entrance Stairs-Rails: Right;Left;Can reach both Home Layout: One level     Bathroom Shower/Tub: Chief Strategy Officer: Standard Bathroom Accessibility: Yes How Accessible: Accessible via walker Home Equipment: Rolling Walker (2 wheels);Cane - single point;Wheelchair - manual;Tub bench;Hand held shower head;BSC/3in1          Prior Functioning/Environment Prior Level of Function : Independent/Modified Independent             Mobility Comments: At baseline, pt uses a RW  for functional  mobility at home and a manual wheelchair in the community. ADLs Comments: At baseline, pt in Independent to Contact guard assist for ADLs.        OT Problem List: Decreased strength;Decreased activity tolerance;Impaired balance (sitting and/or standing);Decreased coordination;Decreased safety awareness;Pain      OT Treatment/Interventions: Self-care/ADL training;Therapeutic exercise;DME and/or AE instruction;Therapeutic activities;Patient/family education;Balance training    OT Goals(Current goals can be found in the care plan section) Acute Rehab OT Goals Patient Stated Goal: to feel better and return home with wife OT Goal Formulation: With patient Time For Goal Achievement: 04/24/23 Potential to Achieve Goals: Good ADL Goals Pt Will Perform Grooming: with contact guard assist;standing Pt Will Perform Lower Body Bathing: with contact guard assist;with adaptive equipment;sitting/lateral leans;sit to/from stand Pt Will Perform Lower Body Dressing: with contact guard assist;with adaptive equipment;sitting/lateral leans;sit to/from stand Pt Will Transfer to Toilet: with contact guard assist;ambulating;bedside commode (with least restrictive AD) Pt Will Perform Toileting - Clothing Manipulation and hygiene: sitting/lateral leans;sit to/from stand;with min assist Pt/caregiver will Perform Home Exercise Program: Increased strength;With theraputty;With Supervision;With written HEP provided (Both right and left hands; increased fine motor coordination)  OT Frequency: Min 1X/week    Co-evaluation              AM-PAC OT "6 Clicks" Daily Activity     Outcome Measure Help from another person eating meals?: A Little           6 Click Score: 3   End of Session Nurse Communication: Mobility status  Activity Tolerance: Patient tolerated treatment well;Patient limited by pain Patient left: in bed;with call bell/phone within reach;with bed alarm set  OT Visit Diagnosis: Pain;Other  (comment);Other abnormalities of gait and mobility (R26.89);Ataxia, unspecified (R27.0) (decreased activity tolerance)                Time: 4098-1191 OT Time Calculation (min): 18 min Charges:  OT General Charges $OT Visit: 1 Visit OT Evaluation $OT Eval Low Complexity: 1 Low  Taisha Pennebaker "Kyle" M., OTR/L, MA Acute Rehab [phone]   Lendon Colonel 04/10/2023, 12:28 PM

## 2023-04-10 NOTE — Progress Notes (Signed)
[facility] PROGRESS NOTE    Progress Note  Michael Rose  WGN:[id number] DOB: 28-Jun-1950 DOA: 04/09/2023 PCP: [name], MD     Brief Narrative:   Michael Rose is an 72 y.o. male past medical history significant for essential hypertension diabetes mellitus type 2 CAD status post PCI, BPH comes in with shortness of breath that started on 04/08/2023, he has been having nonbilious vomiting for about a week prior to admission at [facility] twelve-lead EKG shows sinus tachycardia with diffuse ST segment depression troponins doubled with every repeat last 1 was greater than 3000 chest x-ray showed vascular congestion with right pleural effusion, white count of 17 was transferred to [facility]  Assessment/Plan:   NSTEMI (non-ST elevated myocardial infarction) [facility]) Cardiology was consulted, was started on IV heparin, aspirin, statins, Imdur and continue metoprolol Check a 2D echo. A1c of 5.9 These confused they recommend to continue treating medically and possible cardiac cath on 11/11.  Leukocytosis/watery bowel movement: Chest x-ray showed mild vascular congestion denies any fever or cough. SARS-CoV-2 PCR and influenza are negative. He does relate some diarrhea.  Denies any recent antibiotics use. Continue home dose of Lasix. Temperature of 100.7 respiratory panel is negative. Check a GI panel.  Essential hypertension: Continue metoprolol and Imdur.  Iron deficiency anemia: Will need follow-up with PCP as an outpatient. Patient not remember when was his last colonoscopy.  Hyperlipidemia Continue statins.  Type 2 diabetes mellitus without complication, without long-term current use of insulin (HCC): Continue sliding scale insulin changed to moderate. Continue long-acting insulin, he is currently on a soft diet.  Obesity: Noted has been counseled.  Anxiety/depression: Continue trazodone and fluoxetine  Benign prostatic hyperplasia Cont home meds   DVT  prophylaxis: heparin Family Communication:none Status is: Inpatient Remains inpatient appropriate because: NSTEMI    Code Status:     Code Status Orders  (From admission, onward)           Start     Ordered   04/09/23 1631  Full code  Continuous       Question:  By:  Answer:  Consent: discussion documented in EHR   04/09/23 1632           Code Status History     Date Active Date Inactive Code Status Order ID Comments User Context   11/19/2022 1812 11/25/2022 0002 Full Code [id number]  Emeline General, MD ED         IV Access:   Peripheral IV   Procedures and diagnostic studies:   DG CHEST PORT 1 VIEW  Result Date: 04/09/2023 CLINICAL DATA:  Fever EXAM: PORTABLE CHEST 1 VIEW COMPARISON:  Chest x-ray 04/08/2023 FINDINGS: Small right pleural effusion has mildly increased. The left lung is clear. There is no pneumothorax. The cardiomediastinal silhouette appears stable. The osseous structures are unchanged. IMPRESSION: Small right pleural effusion has mildly increased. Electronically Signed   By: Darliss Cheney M.D.   On: 04/09/2023 22:29     Medical Consultants:   None.   Subjective:    Michael Rose denies some dyspnea on exertion but no chest pain.  He relates he was having diarrhea before coming into the hospital had 1 episode in the hospital  Objective:    Vitals:   04/09/23 1631 04/09/23 1949 04/09/23 2345 04/10/23 0533  BP:  (!) 129/92 114/67 129/71  Pulse:  (!) 109 (!) 102 96  Resp:  20 19 16   Temp:  99.7 F (37.6 C) 98.9 F (37.2  C) 100.3 F (37.9 C)  TempSrc:  Oral Oral Oral  SpO2: 100% 98% 97% 93%  Weight:    97.4 kg  Height:    5\' 11"  (1.803 m)   SpO2: 93 % O2 Flow Rate (L/min): 2 L/min   Intake/Output Summary (Last 24 hours) at 04/10/2023 0637 Last data filed at 04/10/2023 0534 Gross per 24 hour  Intake 620 ml  Output 650 ml  Net -30 ml   Filed Weights   04/10/23 0533  Weight: 97.4 kg    Exam: General exam: In no acute  distress. Respiratory system: Good air movement and clear to auscultation. Cardiovascular system: S1 & S2 heard, RRR. No JVD. Gastrointestinal system: Abdomen is nondistended, soft and nontender.  Extremities: No pedal edema. Skin: No rashes, lesions or ulcers Psychiatry: Judgement and insight appear normal. Mood & affect appropriate.    Data Reviewed:    Labs: Basic Metabolic Panel: Recent Labs  Lab 04/09/23 1747  NA 141  K 3.5  CL 102  CO2 27  GLUCOSE 111*  BUN 24*  CREATININE 1.03  CALCIUM 8.6*  MG 2.1   GFR Estimated Creatinine Clearance: 77.1 mL/min (by C-G formula based on SCr of 1.03 mg/dL). Liver Function Tests: No results for input(s): "AST", "ALT", "ALKPHOS", "BILITOT", "PROT", "ALBUMIN" in the last 168 hours. No results for input(s): "LIPASE", "AMYLASE" in the last 168 hours. No results for input(s): "AMMONIA" in the last 168 hours. Coagulation profile No results for input(s): "INR", "PROTIME" in the last 168 hours. COVID-19 Labs  No results for input(s): "DDIMER", "FERRITIN", "LDH", "CRP" in the last 72 hours.  No results found for: "SARSCOV2NAA"  CBC: Recent Labs  Lab 04/09/23 1747  WBC 9.9  NEUTROABS 8.4*  HGB 12.9*  HCT 40.5  MCV 78.5*  PLT 141*   Cardiac Enzymes: No results for input(s): "CKTOTAL", "CKMB", "CKMBINDEX", "TROPONINI" in the last 168 hours. BNP (last 3 results) No results for input(s): "PROBNP" in the last 8760 hours. CBG: Recent Labs  Lab 04/09/23 1647 04/09/23 2130  GLUCAP 91 133*   D-Dimer: No results for input(s): "DDIMER" in the last 72 hours. Hgb A1c: No results for input(s): "HGBA1C" in the last 72 hours. Lipid Profile: No results for input(s): "CHOL", "HDL", "LDLCALC", "TRIG", "CHOLHDL", "LDLDIRECT" in the last 72 hours. Thyroid function studies: No results for input(s): "TSH", "T4TOTAL", "T3FREE", "THYROIDAB" in the last 72 hours.  Invalid input(s): "FREET3" Anemia work up: No results for input(s):  "VITAMINB12", "FOLATE", "FERRITIN", "TIBC", "IRON", "RETICCTPCT" in the last 72 hours. Sepsis Labs: Recent Labs  Lab 04/09/23 1747  WBC 9.9   Microbiology Recent Results (from the past 240 hour(s))  Respiratory (~20 pathogens) panel by PCR     Status: None   Collection Time: 04/09/23  5:43 PM   Specimen: Nasopharyngeal Swab; Respiratory  Result Value Ref Range Status   Adenovirus NOT DETECTED NOT DETECTED Final   Coronavirus 229E NOT DETECTED NOT DETECTED Final    Comment: (NOTE) The Coronavirus on the Respiratory Panel, DOES NOT test for the novel  Coronavirus (2019 nCoV)    Coronavirus HKU1 NOT DETECTED NOT DETECTED Final   Coronavirus NL63 NOT DETECTED NOT DETECTED Final   Coronavirus OC43 NOT DETECTED NOT DETECTED Final   Metapneumovirus NOT DETECTED NOT DETECTED Final   Rhinovirus / Enterovirus NOT DETECTED NOT DETECTED Final   Influenza A NOT DETECTED NOT DETECTED Final   Influenza B NOT DETECTED NOT DETECTED Final   Parainfluenza Virus 1 NOT DETECTED NOT DETECTED Final  Parainfluenza Virus 2 NOT DETECTED NOT DETECTED Final   Parainfluenza Virus 3 NOT DETECTED NOT DETECTED Final   Parainfluenza Virus 4 NOT DETECTED NOT DETECTED Final   Respiratory Syncytial Virus NOT DETECTED NOT DETECTED Final   Bordetella pertussis NOT DETECTED NOT DETECTED Final   Bordetella Parapertussis NOT DETECTED NOT DETECTED Final   Chlamydophila pneumoniae NOT DETECTED NOT DETECTED Final   Mycoplasma pneumoniae NOT DETECTED NOT DETECTED Final    Comment: Performed at [facility], [address] [place], [place] [address]     Medications:    aspirin EC  81 mg Oral Daily   atorvastatin  40 mg Oral Daily   fluvoxaMINE  200 mg Oral Daily   furosemide  40 mg Oral Daily   insulin aspart  0-15 Units Subcutaneous TID WC   insulin glargine-yfgn  5 Units Subcutaneous QHS   isosorbide mononitrate  30 mg Oral Daily   melatonin  10 mg Oral QHS   metoprolol succinate  50 mg Oral Daily    pantoprazole  40 mg Oral Daily   traZODone  150 mg Oral QHS   Continuous Infusions:  heparin 1,200 Units/hr (04/09/23 2017)      LOS: 1 day   Marinda Elk  [facility]  04/10/2023, 6:37 AM

## 2023-04-10 NOTE — Progress Notes (Signed)
PHARMACY - ANTICOAGULATION CONSULT NOTE  Pharmacy Consult for IV heparin Indication: chest pain/ACS  Allergies  Allergen Reactions   Iodinated Contrast Media Hives and Itching   Penicillins Other (See Comments)    Unknown reaction in childhood    Patient Measurements: Height: 5\' 11"  (180.3 cm) Weight: 97.4 kg (214 lb 11.2 oz) IBW/kg (Calculated) : 75.3 Heparin Dosing Weight: 95.1 kg  Vital Signs: Temp: 98.6 F (37 C) (11/09 1946) Temp Source: Oral (11/09 1946) BP: 113/72 (11/09 1946) Pulse Rate: 93 (11/09 1946)  Labs: Recent Labs    04/09/23 1747 04/10/23 0702 04/10/23 0743 04/10/23 2011  HGB 12.9*  --   --   --   HCT 40.5  --   --   --   PLT 141*  --   --   --   APTT 58*  --   --   --   HEPARINUNFRC 0.30  --  0.16* 0.21*  CREATININE 1.03 0.97  --   --     Estimated Creatinine Clearance: 81.9 mL/min (by C-G formula based on SCr of 0.97 mg/dL).   Medical History: Past Medical History:  Diagnosis Date   Allergic reaction to contrast dye    Bipolar affective disorder (HCC)    Coronary atherosclerosis of native coronary artery    BMS to RCA 2008, [facility], Dr. 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  Initial level 0.3 (at bottom end of goal range).  No overt bleeding or complications noted.  11/9 PM: Heparin level 0.21, subtherapeutic on 1400 units/hr. No issues with infusion running or signs of bleeding documented. CBC stable (hgb 12.9, Plt 141).   Goal of Therapy:  Heparin level 0.3-0.7 units/ml Monitor platelets by anticoagulation protocol: Yes   Plan:  Increase IV  heparin to 1600 units/hr. Recheck heparin level in 8 hrs Daily heparin level and CBC. F/u plans for cath lab Monday.  Loralee Pacas, PharmD, BCPS 04/10/2023 9:13 PM  Please check AMION for all [facility] Pharmacy phone numbers After 10:00 PM, call Main Pharmacy [phone]

## 2023-04-10 NOTE — Progress Notes (Addendum)
PHARMACY - ANTICOAGULATION CONSULT NOTE  Pharmacy Consult for IV heparin Indication: chest pain/ACS  Allergies  Allergen Reactions   Iodinated Contrast Media Hives and Itching   Penicillins Other (See Comments)    Unknown reaction in childhood    Patient Measurements: Height: 5\' 11"  (180.3 cm) Weight: 97.4 kg (214 lb 11.2 oz) IBW/kg (Calculated) : 75.3 Heparin Dosing Weight: 95.1 kg  Vital Signs: Temp: 99.1 F (37.3 C) (11/09 0820) Temp Source: Oral (11/09 0820) BP: 110/66 (11/09 0820) Pulse Rate: 95 (11/09 0820)  Labs: Recent Labs    04/09/23 1747 04/10/23 0702 04/10/23 0743  HGB 12.9*  --   --   HCT 40.5  --   --   PLT 141*  --   --   APTT 58*  --   --   HEPARINUNFRC 0.30  --  0.16*  CREATININE 1.03 0.97  --     Estimated Creatinine Clearance: 81.9 mL/min (by C-G formula based on SCr of 0.97 mg/dL).   Medical History: Past Medical History:  Diagnosis Date   Allergic reaction to contrast dye    Bipolar affective disorder (HCC)    Coronary atherosclerosis of native coronary artery    BMS to RCA 2008, [facility], Dr. 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  Initial level 0.3 (at bottom end of goal range).  No overt bleeding or complications noted.  11/9: Heparin level 0.16, subtherapeutic on 1200 units/hr. No issues with infusion running or signs of bleeding noted per RN. CBC stable (hgb 12.9, Plt 141).   Goal of Therapy:  Heparin level 0.3-0.7 units/ml Monitor platelets by anticoagulation protocol: Yes   Plan:  Increase IV heparin to 1400 units/hr. Recheck heparin  level in 8 hrs Daily heparin level and CBC. F/u plans for cath lab Monday.  Enos Fling, PharmD PGY-1 Acute Care Pharmacy Resident 04/10/2023 10:56 AM  [facility], [facility] pharmacy phone numbers are listed on [email or web address]

## 2023-04-11 DIAGNOSIS — I214 Non-ST elevation (NSTEMI) myocardial infarction: Principal | ICD-10-CM

## 2023-04-11 LAB — CBC
HCT: 34 % — ABNORMAL LOW (ref 39.0–52.0)
Hemoglobin: 11.1 g/dL — ABNORMAL LOW (ref 13.0–17.0)
MCH: 24.8 pg — ABNORMAL LOW (ref 26.0–34.0)
MCHC: 32.6 g/dL (ref 30.0–36.0)
MCV: 75.9 fL — ABNORMAL LOW (ref 80.0–100.0)
Platelets: 121 10*3/uL — ABNORMAL LOW (ref 150–400)
RBC: 4.48 MIL/uL (ref 4.22–5.81)
RDW: 16.5 % — ABNORMAL HIGH (ref 11.5–15.5)
WBC: 10.2 10*3/uL (ref 4.0–10.5)
nRBC: 0 % (ref 0.0–0.2)

## 2023-04-11 LAB — GLUCOSE, CAPILLARY
Glucose-Capillary: 123 mg/dL — ABNORMAL HIGH (ref 70–99)
Glucose-Capillary: 137 mg/dL — ABNORMAL HIGH (ref 70–99)
Glucose-Capillary: 140 mg/dL — ABNORMAL HIGH (ref 70–99)
Glucose-Capillary: 169 mg/dL — ABNORMAL HIGH (ref 70–99)

## 2023-04-11 LAB — HEPARIN LEVEL (UNFRACTIONATED)
Heparin Unfractionated: 0.28 [IU]/mL — ABNORMAL LOW (ref 0.30–0.70)
Heparin Unfractionated: 0.23 [IU]/mL — ABNORMAL LOW (ref 0.30–0.70)
Heparin Unfractionated: 0.32 [IU]/mL (ref 0.30–0.70)

## 2023-04-11 MED ORDER — DICLOFENAC SODIUM 1 % EX GEL
2.0000 g | Freq: Four times a day (QID) | CUTANEOUS | Status: DC
  Administered 2023-04-12 – 2023-04-22 (×36): 2 g via TOPICAL
  Filled 2023-04-11: qty 100

## 2023-04-11 MED ORDER — SODIUM CHLORIDE 0.9% FLUSH
10.0000 mL | Freq: Two times a day (BID) | INTRAVENOUS | Status: DC
  Administered 2023-04-11: 10 mL via INTRAVENOUS

## 2023-04-11 MED ORDER — GLUCERNA SHAKE PO LIQD
237.0000 mL | Freq: Three times a day (TID) | ORAL | Status: DC
  Administered 2023-04-11 – 2023-04-22 (×18): 237 mL via ORAL

## 2023-04-11 MED ORDER — ENSURE ENLIVE PO LIQD: 237.0000 mL | Freq: Two times a day (BID) | ORAL | Status: DC

## 2023-04-11 MED ORDER — POTASSIUM CHLORIDE CRYS ER 20 MEQ PO TBCR
40.0000 meq | EXTENDED_RELEASE_TABLET | Freq: Two times a day (BID) | ORAL | Status: AC
  Administered 2023-04-11 (×2): 40 meq via ORAL
  Filled 2023-04-11 (×2): qty 2

## 2023-04-11 MED ORDER — HYDROMORPHONE HCL 1 MG/ML IJ SOLN
0.5000 mg | Freq: Once | INTRAMUSCULAR | Status: AC
  Administered 2023-04-11: 0.5 mg via INTRAVENOUS
  Filled 2023-04-11: qty 1

## 2023-04-11 NOTE — Progress Notes (Addendum)
[facility] PROGRESS NOTE    Progress Note  Michael Rose  MWU:[id number] DOB: [birth year] DOA: 04/09/2023 PCP: [name], MD     Brief Narrative:   Michael Rose is an 72 y.o. male past medical history significant for essential hypertension diabetes mellitus type 2 CAD status post PCI, BPH comes in with shortness of breath that started on 04/08/2023, he has been having nonbilious vomiting for about a week prior to admission at [facility] twelve-lead EKG shows sinus tachycardia with diffuse ST segment depression troponins doubled with every repeat last 1 was greater than 3000 chest x-ray showed vascular congestion with right pleural effusion, white count of 17 was transferred to [facility]  Assessment/Plan:   NSTEMI (non-ST elevated myocardial infarction) (HCC) He was started on IV heparin, aspirin, statin, Imdur and metoprolol. 2D echo was done that showed an EF of 40% with regional wall motion abnormality along with grade 1 diastolic dysfunction.  There is a very small inferoapical area which appeared to be frankly dyskinetic. Cardiology recommended to continue to treat medically for left heart cath on 04/12/2023.  Leukocytosis/watery bowel movement: Chest x-ray showed mild vascular congestion denies any fever cough SARS-CoV-2 PCR and influenza were negative. He denies any recent antibiotic's use. Had some diarrhea, Tmax was 100.7 respiratory panel was negative. GI panel is pending.  Essential hypertension: Continue metoprolol and Imdur.  Iron deficiency anemia: Will need follow-up with PCP as an outpatient. Patient not remember when was his last colonoscopy.  Hyperlipidemia Continue statins.  Type 2 diabetes mellitus without complication, without long-term current use of insulin (HCC): Continue long-acting insulin plus sliding scale.  Obesity: Noted has been counseled.  Anxiety/depression: Continue trazodone and fluoxetine  Benign prostatic hyperplasia Cont home  meds   DVT prophylaxis: heparin Family Communication:none Status is: Inpatient Remains inpatient appropriate because: NSTEMI    Code Status:     Code Status Orders  (From admission, onward)           Start     Ordered   04/09/23 1631  Full code  Continuous       Question:  By:  Answer:  Consent: discussion documented in EHR   04/09/23 1632           Code Status History     Date Active Date Inactive Code Status Order ID Comments User Context   11/19/2022 1812 11/25/2022 0002 Full Code [id number]  Emeline General, MD ED         IV Access:   Peripheral IV   Procedures and diagnostic studies:   DG Ankle 2 Views Left  Result Date: 04/11/2023 CLINICAL DATA:  Left ankle pain. EXAM: LEFT ANKLE - 2 VIEW COMPARISON:  None Available. FINDINGS: There is no evidence of acute fracture or dislocation. Degenerative changes are noted at the ankle and midfoot. Calcaneal spurring is noted. There are enthesopathic changes at the insertion site of the Achilles tendon. Vascular calcifications are noted in the soft tissues. IMPRESSION: 1. No acute fracture or dislocation. 2. Scattered degenerative changes. Electronically Signed   By: Thornell Sartorius M.D.   On: 04/11/2023 02:21   ECHOCARDIOGRAM COMPLETE  Result Date: 04/10/2023    ECHOCARDIOGRAM REPORT   Patient Name:   Michael Rose Files Date of Exam: 04/10/2023 Medical Rec #:  [id number]      Height:       71.0 in Accession #:    [id number]     Weight:       214.7 lb Date of  Birth:  January 23, 1951       BSA:          2.173 m Patient Age:    72 years       BP:           129/71 mmHg Patient Gender: M              HR:           96 bpm. Exam Location:  Inpatient Procedure: 2D Echo, Color Doppler, Cardiac Doppler and Intracardiac            Opacification Agent Indications:    NSTEMI  History:        Patient has prior history of Echocardiogram examinations, most                 recent 11/20/2022. CAD and NSTEMI; Risk Factors:Hypertension,                  Dyslipidemia and Diabetes.  Sonographer:    [facility] Referring Phys: [id number] Cecille Po MELVIN IMPRESSIONS  1. No thrombus is seen (Definity contrast was used). Left ventricular ejection fraction, by estimation, is 40 to 45%. The left ventricle has mildly decreased function. The left ventricle demonstrates regional wall motion abnormalities (see scoring diagram/findings for description). Left ventricular diastolic parameters are consistent with Grade I diastolic dysfunction (impaired relaxation). A very small area of the inferoapical "cap" appears to be frankly dyskinetic.  2. Right ventricular systolic function is normal. The right ventricular size is normal. Tricuspid regurgitation signal is inadequate for assessing PA pressure.  3. Left atrial size was moderately dilated.  4. The mitral valve is normal in structure. Mild to moderate mitral valve regurgitation.  5. The aortic valve is tricuspid. There is mild calcification of the aortic valve. There is mild thickening of the aortic valve. Aortic valve regurgitation is not visualized. Aortic valve sclerosis/calcification is present, without any evidence of aortic stenosis.  6. The inferior vena cava is dilated in size with <50% respiratory variability, suggesting right atrial pressure of 15 mmHg. Comparison(s): Prior images reviewed side by side. The left ventricular function is worsened. The left ventricular wall motion is new. FINDINGS  Left Ventricle: No thrombus is seen (Definity contrast was used). Left ventricular ejection fraction, by estimation, is 40 to 45%. The left ventricle has mildly decreased function. The left ventricle demonstrates regional wall motion abnormalities. Definity contrast agent was given IV to delineate the left ventricular endocardial borders. The left ventricular internal cavity size was normal in size. There is no left ventricular hypertrophy. Left ventricular diastolic parameters are consistent with Grade I diastolic dysfunction  (impaired relaxation). Indeterminate filling pressures.  LV Wall Scoring: The apex is dyskinetic. The entire inferior wall and posterior wall are hypokinetic. A very small area of the inferoapical "cap" appears to be frankly dyskinetic. Right Ventricle: The right ventricular size is normal. Right vetricular wall thickness was not well visualized. Right ventricular systolic function is normal. Tricuspid regurgitation signal is inadequate for assessing PA pressure. Left Atrium: Left atrial size was moderately dilated. Right Atrium: Right atrial size was normal in size. Pericardium: There is no evidence of pericardial effusion. Mitral Valve: The mitral valve is normal in structure. Mild to moderate mitral valve regurgitation, with centrally-directed jet. Tricuspid Valve: The tricuspid valve is normal in structure. Tricuspid valve regurgitation is not demonstrated. Aortic Valve: The aortic valve is tricuspid. There is mild calcification of the aortic valve. There is mild thickening of the aortic valve. Aortic valve  regurgitation is not visualized. Aortic valve sclerosis/calcification is present, without any evidence of aortic stenosis. Aortic valve mean gradient measures 6.0 mmHg. Aortic valve peak gradient measures 10.6 mmHg. Aortic valve area, by VTI measures 1.89 cm. Pulmonic Valve: The pulmonic valve was normal in structure. Pulmonic valve regurgitation is not visualized. Aorta: The aortic root is normal in size and structure. Venous: The inferior vena cava is dilated in size with less than 50% respiratory variability, suggesting right atrial pressure of 15 mmHg. IAS/Shunts: No atrial level shunt detected by color flow Doppler.  LEFT VENTRICLE PLAX 2D LVIDd:         5.50 cm   Diastology LVIDs:         4.30 cm   LV e' medial:    7.07 cm/s LV PW:         1.10 cm   LV E/e' medial:  13.0 LV IVS:        1.00 cm   LV e' lateral:   9.79 cm/s LVOT diam:     2.20 cm   LV E/e' lateral: 9.4 LV SV:         52 LV SV Index:   24  LVOT Area:     3.80 cm  RIGHT VENTRICLE RV Basal diam:  3.10 cm RV Mid diam:    1.70 cm RV S prime:     12.90 cm/s TAPSE (M-mode): 2.1 cm LEFT ATRIUM             Index        RIGHT ATRIUM           Index LA diam:        4.70 cm 2.16 cm/m   RA Area:     14.50 cm LA Vol (A2C):   68.2 ml 31.38 ml/m  RA Volume:   31.10 ml  14.31 ml/m LA Vol (A4C):   44.0 ml 20.25 ml/m LA Biplane Vol: 54.3 ml 24.99 ml/m  AORTIC VALVE AV Area (Vmax):    1.91 cm AV Area (Vmean):   1.82 cm AV Area (VTI):     1.89 cm AV Vmax:           163.00 cm/s AV Vmean:          113.000 cm/s AV VTI:            0.275 m AV Peak Grad:      10.6 mmHg AV Mean Grad:      6.0 mmHg LVOT Vmax:         81.80 cm/s LVOT Vmean:        54.000 cm/s LVOT VTI:          0.137 m LVOT/AV VTI ratio: 0.50  AORTA Ao Root diam: 3.60 cm MITRAL VALVE MV Area (PHT): 4.29 cm     SHUNTS MV Decel Time: 177 msec     Systemic VTI:  0.14 m MV E velocity: 92.20 cm/s   Systemic Diam: 2.20 cm MV A velocity: 113.00 cm/s MV E/A ratio:  0.82 Mihai Croitoru MD Electronically signed by Thurmon Fair MD Signature Date/Time: 04/10/2023/6:09:56 PM    Final    DG CHEST PORT 1 VIEW  Result Date: 04/09/2023 CLINICAL DATA:  Fever EXAM: PORTABLE CHEST 1 VIEW COMPARISON:  Chest x-ray 04/08/2023 FINDINGS: Small right pleural effusion has mildly increased. The left lung is clear. There is no pneumothorax. The cardiomediastinal silhouette appears stable. The osseous structures are unchanged. IMPRESSION: Small right pleural effusion has mildly increased. Electronically Signed  By: Darliss Cheney M.D.   On: 04/09/2023 22:29     Medical Consultants:   None.   Subjective:    Michael Rose shortness of breath resolved denies any exertional chest pain.  Objective:    Vitals:   04/10/23 2346 04/11/23 0254 04/11/23 0330 04/11/23 0756  BP: 108/76  122/77   Pulse: 87 99 97   Resp: 20  20 20   Temp: 98.2 F (36.8 C)   99 F (37.2 C)  TempSrc: Oral  Oral Oral  SpO2: 93% 92% 97%    Weight:      Height:       SpO2: 97 % O2 Flow Rate (L/min): 2 L/min   Intake/Output Summary (Last 24 hours) at 04/11/2023 0826 Last data filed at 04/11/2023 0401 Gross per 24 hour  Intake 96.27 ml  Output 600 ml  Net -503.73 ml   Filed Weights   04/10/23 0533  Weight: 97.4 kg    Exam: General exam: In no acute distress. Respiratory system: Good air movement and clear to auscultation. Cardiovascular system: S1 & S2 heard, RRR. No JVD. Gastrointestinal system: Abdomen is nondistended, soft and nontender.  Extremities: No pedal edema. Skin: No rashes, lesions or ulcers Psychiatry: Judgement and insight appear normal. Mood & affect appropriate.  Data Reviewed:    Labs: Basic Metabolic Panel: Recent Labs  Lab 04/09/23 1747 04/10/23 0702  NA 141 136  K 3.5 3.4*  CL 102 103  CO2 27 23  GLUCOSE 111* 128*  BUN 24* 26*  CREATININE 1.03 0.97  CALCIUM 8.6* 8.2*  MG 2.1  --    GFR Estimated Creatinine Clearance: 81.9 mL/min (by C-G formula based on SCr of 0.97 mg/dL). Liver Function Tests: No results for input(s): "AST", "ALT", "ALKPHOS", "BILITOT", "PROT", "ALBUMIN" in the last 168 hours. No results for input(s): "LIPASE", "AMYLASE" in the last 168 hours. No results for input(s): "AMMONIA" in the last 168 hours. Coagulation profile No results for input(s): "INR", "PROTIME" in the last 168 hours. COVID-19 Labs  No results for input(s): "DDIMER", "FERRITIN", "LDH", "CRP" in the last 72 hours.  No results found for: "SARSCOV2NAA"  CBC: Recent Labs  Lab 04/09/23 1747 04/11/23 0423  WBC 9.9 10.2  NEUTROABS 8.4*  --   HGB 12.9* 11.1*  HCT 40.5 34.0*  MCV 78.5* 75.9*  PLT 141* 121*   Cardiac Enzymes: No results for input(s): "CKTOTAL", "CKMB", "CKMBINDEX", "TROPONINI" in the last 168 hours. BNP (last 3 results) No results for input(s): "PROBNP" in the last 8760 hours. CBG: Recent Labs  Lab 04/10/23 0800 04/10/23 1119 04/10/23 1617 04/10/23 2043  04/11/23 0752  GLUCAP 137* 137* 188* 153* 123*   D-Dimer: No results for input(s): "DDIMER" in the last 72 hours. Hgb A1c: No results for input(s): "HGBA1C" in the last 72 hours. Lipid Profile: Recent Labs    04/10/23 0702  CHOL 111  HDL 41  LDLCALC 51  TRIG 94  CHOLHDL 2.7   Thyroid function studies: No results for input(s): "TSH", "T4TOTAL", "T3FREE", "THYROIDAB" in the last 72 hours.  Invalid input(s): "FREET3" Anemia work up: No results for input(s): "VITAMINB12", "FOLATE", "FERRITIN", "TIBC", "IRON", "RETICCTPCT" in the last 72 hours. Sepsis Labs: Recent Labs  Lab 04/09/23 1747 04/11/23 0423  WBC 9.9 10.2   Microbiology Recent Results (from the past 240 hour(s))  Respiratory (~20 pathogens) panel by PCR     Status: None   Collection Time: 04/09/23  5:43 PM   Specimen: Nasopharyngeal Swab; Respiratory  Result Value  Ref Range Status   Adenovirus NOT DETECTED NOT DETECTED Final   Coronavirus 229E NOT DETECTED NOT DETECTED Final    Comment: (NOTE) The Coronavirus on the Respiratory Panel, DOES NOT test for the novel  Coronavirus (2019 nCoV)    Coronavirus HKU1 NOT DETECTED NOT DETECTED Final   Coronavirus NL63 NOT DETECTED NOT DETECTED Final   Coronavirus OC43 NOT DETECTED NOT DETECTED Final   Metapneumovirus NOT DETECTED NOT DETECTED Final   Rhinovirus / Enterovirus NOT DETECTED NOT DETECTED Final   Influenza A NOT DETECTED NOT DETECTED Final   Influenza B NOT DETECTED NOT DETECTED Final   Parainfluenza Virus 1 NOT DETECTED NOT DETECTED Final   Parainfluenza Virus 2 NOT DETECTED NOT DETECTED Final   Parainfluenza Virus 3 NOT DETECTED NOT DETECTED Final   Parainfluenza Virus 4 NOT DETECTED NOT DETECTED Final   Respiratory Syncytial Virus NOT DETECTED NOT DETECTED Final   Bordetella pertussis NOT DETECTED NOT DETECTED Final   Bordetella Parapertussis NOT DETECTED NOT DETECTED Final   Chlamydophila pneumoniae NOT DETECTED NOT DETECTED Final   Mycoplasma  pneumoniae NOT DETECTED NOT DETECTED Final    Comment: Performed at [facility], [address] [place], [place] [address]     Medications:    aspirin EC  81 mg Oral Daily   atorvastatin  40 mg Oral Daily   fluvoxaMINE  200 mg Oral Daily   furosemide  40 mg Oral Daily   insulin aspart  0-15 Units Subcutaneous TID WC   insulin aspart  0-5 Units Subcutaneous QHS   insulin aspart  4 Units Subcutaneous TID WC   insulin glargine-yfgn  5 Units Subcutaneous QHS   isosorbide mononitrate  30 mg Oral Daily   melatonin  10 mg Oral QHS   metoprolol succinate  100 mg Oral Daily   pantoprazole  40 mg Oral Daily   QUEtiapine  25 mg Oral QHS   traZODone  150 mg Oral QHS   Continuous Infusions:  heparin 1,800 Units/hr (04/11/23 0532)      LOS: 2 days   Marinda Elk  [facility]  04/11/2023, 8:26 AM

## 2023-04-11 NOTE — Progress Notes (Signed)
PHARMACY - ANTICOAGULATION CONSULT NOTE  Pharmacy Consult for IV heparin Indication: chest pain/ACS  Allergies  Allergen Reactions   Iodinated Contrast Media Hives and Itching   Penicillins Other (See Comments)    Unknown reaction in childhood    Patient Measurements: Height: 5\' 11"  (180.3 cm) Weight: 97.4 kg (214 lb 11.2 oz) IBW/kg (Calculated) : 75.3 Heparin Dosing Weight: 95.1 kg  Vital Signs: Temp: 99 F (37.2 C) (11/10 0756) Temp Source: Oral (11/10 0756) BP: 118/78 (11/10 0755) Pulse Rate: 88 (11/10 0755)  Labs: Recent Labs    04/09/23 1747 04/10/23 0702 04/10/23 0743 04/10/23 2011 04/11/23 0423 04/11/23 1106  HGB 12.9*  --   --   --  11.1*  --   HCT 40.5  --   --   --  34.0*  --   PLT 141*  --   --   --  121*  --   APTT 58*  --   --   --   --   --   HEPARINUNFRC 0.30  --    < > 0.21* 0.28* 0.23*  CREATININE 1.03 0.97  --   --   --   --    < > = values in this interval not displayed.    Estimated Creatinine Clearance: 81.9 mL/min (by C-G formula based on SCr of 0.97 mg/dL).   Medical History: Past Medical History:  Diagnosis Date   Allergic reaction to contrast dye    Bipolar affective disorder (HCC)    Coronary atherosclerosis of native coronary artery    BMS to RCA 2008, [facility], Dr. Katrinka Blazing   Diabetes mellitus without complication [facility])    GERD (gastroesophageal reflux disease)    Hard of hearing    Right ear   Hyperlipidemia    Hypertension    Myocardial infarction [facility])    IMI 2008   Osteoarthritis    Ringing in right ear     Medications:  Infusions:   heparin 1,800 Units/hr (04/11/23 0800)    Assessment: 72 yo male with NSTEMI transferred to [facility] with IV heparin running at 1130 units/hr.  Initial level 0.3 (at bottom end of goal range).  No overt bleeding or complications noted.  11/10 1200: HL 0.23, subtherapeutic and decreasing after rate increased to 1800 units/hr. No issues with infusion running or signs of bleeding noted  per RN. CBC stable (Hgb 11.1, Plt 121).   Goal of Therapy:  Heparin level 0.3-0.7 units/ml Monitor platelets by anticoagulation protocol: Yes   Plan:  Increase IV heparin to 1900 units/hr. Recheck heparin level in 8 hrs Daily heparin level and CBC. F/u plans for cath lab Monday.  Enos Fling, PharmD PGY-1 Acute Care Pharmacy Resident 04/11/2023 11:56 AM  Please check AMION for all [facility] phone numbers After 10:00 PM, call Main Pharmacy [phone]

## 2023-04-11 NOTE — Plan of Care (Signed)
  Problem: Education: Goal: Knowledge of General Education information will improve Description: Including pain rating scale, medication(s)/side effects and non-pharmacologic comfort measures Outcome: Progressing   Problem: Health Behavior/Discharge Planning: Goal: Ability to manage health-related needs will improve Outcome: Progressing   Problem: Activity: Goal: Risk for activity intolerance will decrease Outcome: Not Progressing   Problem: Nutrition: Goal: Adequate nutrition will be maintained Outcome: Progressing   Problem: Coping: Goal: Level of anxiety will decrease Outcome: Progressing   Problem: Elimination: Goal: Will not experience complications related to bowel motility Outcome: Progressing Goal: Will not experience complications related to urinary retention Outcome: Progressing   Problem: Pain Management: Goal: General experience of comfort will improve Outcome: Progressing   Problem: Safety: Goal: Ability to remain free from injury will improve Outcome: Progressing   Problem: Skin Integrity: Goal: Risk for impaired skin integrity will decrease Outcome: Progressing   Problem: Education: Goal: Understanding of cardiac disease, CV risk reduction, and recovery process will improve Outcome: Progressing Goal: Individualized Educational Video(s) Outcome: Progressing   Problem: Activity: Goal: Ability to tolerate increased activity will improve Outcome: Progressing   Problem: Cardiac: Goal: Ability to achieve and maintain adequate cardiovascular perfusion will improve Outcome: Progressing   Problem: Education: Goal: Ability to describe self-care measures that may prevent or decrease complications (Diabetes Survival Skills Education) will improve Outcome: Progressing Goal: Individualized Educational Video(s) Outcome: Progressing   Problem: Coping: Goal: Ability to adjust to condition or change in health will improve Outcome: Progressing   Problem:  Health Behavior/Discharge Planning: Goal: Ability to identify and utilize available resources and services will improve Outcome: Progressing Goal: Ability to manage health-related needs will improve Outcome: Progressing   Problem: Skin Integrity: Goal: Risk for impaired skin integrity will decrease Outcome: Progressing   Problem: Nutritional: Goal: Maintenance of adequate nutrition will improve Outcome: Progressing Goal: Progress toward achieving an optimal weight will improve Outcome: Progressing   Problem: Skin Integrity: Goal: Risk for impaired skin integrity will decrease Outcome: Progressing   Problem: Tissue Perfusion: Goal: Adequacy of tissue perfusion will improve Outcome: Progressing

## 2023-04-11 NOTE — TOC Initial Note (Signed)
Transition of Care [facility]) - Initial/Assessment Note    Patient Details  Name: Michael Rose MRN: [id number] Date of Birth: [birth year]  Transition of Care [facility]) CM/SW Contact:    Maree Krabbe, LCSW Phone Number: 04/11/2023, 9:53 AM  Clinical Narrative:    SW spoke with pt about discharge plan. Pt came from [facility] Pt is not sure he wants to go back there. Pt states he has a procedure tomorrow and for SW to check back in with him after that.               Expected Discharge Plan: Skilled Nursing Facility Barriers to Discharge: Continued Medical Work up   Patient Goals and CMS Choice Patient states their goals for this hospitalization and ongoing recovery are:: to get better   Choice offered to / list presented to : Patient [place] ownership interest in [facility].provided to:: Patient    Expected Discharge Plan and Services In-house Referral: Clinical Social Work   Post Acute Care Choice: Skilled Nursing Facility Living arrangements for the past 2 months: Skilled Nursing Facility                                      Prior Living Arrangements/Services Living arrangements for the past 2 months: Skilled Nursing Facility Lives with:: Facility Resident Patient language and need for interpreter reviewed:: No Do you feel safe going back to the place where you live?: Yes      Need for Family Participation in Patient Care: Yes (Comment) Care giver support system in place?: Yes (comment)   Criminal Activity/Legal Involvement Pertinent to Current Situation/Hospitalization: No - Comment as needed  Activities of Daily Living   ADL Screening (condition at time of admission) Independently performs ADLs?: Yes (appropriate for developmental age) (uses walker and wheelchair at home) Is the patient deaf or have difficulty hearing?: No Does the patient have difficulty seeing, even when wearing glasses/contacts?: No Does the patient have difficulty  concentrating, remembering, or making decisions?: No  Permission Sought/Granted Permission sought to share information with : Family Supports Permission granted to share information with : Yes, Verbal Permission Granted  Share Information with NAME: Kathie Rhodes     Permission granted to share info w Relationship: spouse  Permission granted to share info w Contact Information: [phone]  Emotional Assessment Appearance:: Appears stated age Attitude/Demeanor/Rapport: Engaged Affect (typically observed): Appropriate Orientation: : Oriented to Situation, Oriented to  Time, Oriented to Place, Oriented to Self Alcohol / Substance Use: Not Applicable Psych Involvement: No (comment)  Admission diagnosis:  NSTEMI (non-ST elevated myocardial infarction) [facility]) [I21.4] Patient Active Problem List   Diagnosis Date Noted   NSTEMI (non-ST elevated myocardial infarction) (HCC) 04/09/2023   Vitamin D deficiency 11/21/2022   Type 2 diabetes mellitus without complication, without long-term current use of insulin (HCC) 11/20/2022   Benign prostatic hyperplasia 11/20/2022   Anxiety and depression 11/20/2022   Leukocytosis 11/20/2022   Closed fracture of right distal femur (HCC) 11/19/2022   Femoral fracture (HCC) 11/19/2022   Mixed hyperlipidemia 07/08/2010   MORBID OBESITY 07/08/2010   Essential hypertension, benign 07/08/2010   CORONARY ATHEROSCLEROSIS NATIVE CORONARY ARTERY 07/08/2010   PCP:  [name], MD Pharmacy:   [facility] - [place], [place] - [address] [place] [address] Phone: [phone] Fax: [phone]  [facility] - [place], [place] - [address] [place] [address] Phone: [phone] Fax: [phone]     Social Determinants of Health (SDOH) Social History: SDOH Screenings   Food Insecurity: No Food Insecurity (04/10/2023)  Housing: Low Risk  (04/10/2023)  Transportation Needs: No Transportation Needs (04/10/2023)   Utilities: Not At Risk (04/10/2023)  Financial Resource Strain: High Risk (01/19/2022)  Physical Activity: Insufficiently Active (07/21/2022)   Received from [facility], [facility]  Social Connections: Socially Integrated (07/21/2022)   Received from [facility], [facility]  Stress: No Stress Concern Present (07/21/2022)   Received from [facility], [facility]  Tobacco Use: Low Risk  (04/10/2023)  Health Literacy: Low Risk  (07/21/2022)   Received from [facility], [facility]   SDOH Interventions:     Readmission Risk Interventions     No data to display

## 2023-04-11 NOTE — Progress Notes (Signed)
PHARMACY - ANTICOAGULATION CONSULT NOTE  Pharmacy Consult for IV heparin Indication: chest pain/ACS  Allergies  Allergen Reactions   Iodinated Contrast Media Hives and Itching   Penicillins Other (See Comments)    Unknown reaction in childhood    Patient Measurements: Height: 5\' 11"  (180.3 cm) Weight: 97.4 kg (214 lb 11.2 oz) IBW/kg (Calculated) : 75.3 Heparin Dosing Weight: 95.1 kg  Vital Signs: Temp: 100 F (37.8 C) (11/10 2004) Temp Source: Oral (11/10 2004) BP: 114/70 (11/10 2004) Pulse Rate: 91 (11/10 2004)  Labs: Recent Labs    04/09/23 1747 04/10/23 0702 04/10/23 0743 04/11/23 0423 04/11/23 1106 04/11/23 2042  HGB 12.9*  --   --  11.1*  --   --   HCT 40.5  --   --  34.0*  --   --   PLT 141*  --   --  121*  --   --   APTT 58*  --   --   --   --   --   HEPARINUNFRC 0.30  --    < > 0.28* 0.23* 0.32  CREATININE 1.03 0.97  --   --   --   --    < > = values in this interval not displayed.    Estimated Creatinine Clearance: 81.9 mL/min (by C-G formula based on SCr of 0.97 mg/dL).   Medical History: Past Medical History:  Diagnosis Date   Allergic reaction to contrast dye    Bipolar affective disorder (HCC)    Coronary atherosclerosis of native coronary artery    BMS to RCA 2008, [facility], Dr. Katrinka Blazing   Diabetes mellitus without complication [facility])    GERD (gastroesophageal reflux disease)    Hard of hearing    Right ear   Hyperlipidemia    Hypertension    Myocardial infarction [facility])    IMI 2008   Osteoarthritis    Ringing in right ear     Medications:  Infusions:   heparin 1,900 Units/hr (04/11/23 1800)    Assessment: 72 yo male with NSTEMI transferred to [facility] with IV heparin running at 1130 units/hr.  Initial level 0.3 (at bottom end of goal range).  No overt bleeding or complications noted.  Heparin level tonight came back therapeutic at 0.32, on 1900 units/hr. No s/sx of bleeding or infusion issues.   Goal of Therapy:  Heparin level  0.3-0.7 units/ml Monitor platelets by anticoagulation protocol: Yes   Plan:  Increase IV heparin to 1950 units/hr to keep in goal range Daily heparin level and CBC. F/u plans for cath lab Monday.  Thank you for allowing pharmacy to participate in this patient's care,  Sherron Monday, PharmD, BCCCP Clinical Pharmacist  Phone: [phone] 04/11/2023 9:39 PM  Please check AMION for all [facility] Pharmacy phone numbers After 10:00 PM, call Main Pharmacy [phone]

## 2023-04-11 NOTE — [place] FL2 (Signed)
[place] MEDICAID FL2 LEVEL OF CARE FORM     IDENTIFICATION  Patient Name: Michael Rose Birthdate: Jan 26, 1951 Sex: male Admission Date (Current Location): 04/09/2023  [facility] and [place] Number:  Producer, television/film/video and Address:  [facility] [place], [address], [place], [place] [address]      Provider Number: [id number]  Attending Physician Name and Address:  Marinda Elk, MD  Relative Name and Phone Number:       Current Level of Care: Hospital Recommended Level of Care: Skilled Nursing Facility Prior Approval Number:    Date Approved/Denied:   PASRR Number: [id number]  Discharge Plan: SNF    Current Diagnoses: Patient Active Problem List   Diagnosis Date Noted   NSTEMI (non-ST elevated myocardial infarction) (HCC) 04/09/2023   Vitamin D deficiency 11/21/2022   Type 2 diabetes mellitus without complication, without long-term current use of insulin (HCC) 11/20/2022   Benign prostatic hyperplasia 11/20/2022   Anxiety and depression 11/20/2022   Leukocytosis 11/20/2022   Closed fracture of right distal femur (HCC) 11/19/2022   Femoral fracture (HCC) 11/19/2022   Mixed hyperlipidemia 07/08/2010   MORBID OBESITY 07/08/2010   Essential hypertension, benign 07/08/2010   CORONARY ATHEROSCLEROSIS NATIVE CORONARY ARTERY 07/08/2010    Orientation RESPIRATION BLADDER Height & Weight     Self, Time, Situation, Place  Normal Incontinent Weight: 214 lb 11.2 oz (97.4 kg) Height:  5\' 11"  (180.3 cm)  BEHAVIORAL SYMPTOMS/MOOD NEUROLOGICAL BOWEL NUTRITION STATUS      Continent Diet (soft diet)  AMBULATORY STATUS COMMUNICATION OF NEEDS Skin   Extensive Assist Verbally Normal                       Personal Care Assistance Level of Assistance  Bathing, Feeding, Dressing Bathing Assistance: Maximum assistance Feeding assistance: Independent Dressing Assistance: Maximum assistance     Functional Limitations Info  Sight, Hearing, Speech  Sight Info: Adequate Hearing Info: Adequate Speech Info: Adequate    SPECIAL CARE FACTORS FREQUENCY  PT (By licensed PT), OT (By licensed OT)     PT Frequency: 5x OT Frequency: 5x            Contractures Contractures Info: Not present    Additional Factors Info  Code Status, Allergies Code Status Info: full code Allergies Info: Iodinated Contrast Media, Penicillins           Current Medications (04/11/2023):  This is the current hospital active medication list Current Facility-Administered Medications  Medication Dose Route Frequency Provider Last Rate Last Admin   acetaminophen (TYLENOL) tablet 650 mg  650 mg Oral Q4H PRN Synetta Fail, MD   650 mg at 04/10/23 1625   aspirin EC tablet 81 mg  81 mg Oral Daily Synetta Fail, MD   81 mg at 04/11/23 0919   atorvastatin (LIPITOR) tablet 40 mg  40 mg Oral Daily Corrin Parker, PA-C   40 mg at 04/11/23 0919   fluvoxaMINE (LUVOX) tablet 200 mg  200 mg Oral Daily Synetta Fail, MD   200 mg at 04/11/23 0919   furosemide (LASIX) tablet 40 mg  40 mg Oral Daily Synetta Fail, MD   40 mg at 04/11/23 0919   heparin ADULT infusion 100 units/mL (25000 units/281mL)  1,800 Units/hr Intravenous Continuous Juliette Mangle, RPH 18 mL/hr at 04/11/23 0800 1,800 Units/hr at 04/11/23 0800   HYDROcodone-acetaminophen (NORCO) 7.5-325 MG per tablet 1 tablet  1 tablet Oral Q6H PRN Radonna Ricker  Rosine Beat, MD   1 tablet at 04/11/23 0415   insulin aspart (novoLOG) injection 0-15 Units  0-15 Units Subcutaneous TID WC Marinda Elk, MD   2 Units at 04/11/23 0850   insulin aspart (novoLOG) injection 0-5 Units  0-5 Units Subcutaneous QHS Marinda Elk, MD       insulin aspart (novoLOG) injection 4 Units  4 Units Subcutaneous TID WC Marinda Elk, MD   4 Units at 04/11/23 0850   insulin glargine-yfgn [facility]) injection 5 Units  5 Units Subcutaneous QHS Synetta Fail, MD   5 Units at 04/10/23 2134   isosorbide  mononitrate (IMDUR) 24 hr tablet 30 mg  30 mg Oral Daily Synetta Fail, MD   30 mg at 04/11/23 0919   melatonin tablet 10 mg  10 mg Oral QHS Dolly Rias, MD   10 mg at 04/10/23 2134   metoprolol succinate (TOPROL-XL) 24 hr tablet 100 mg  100 mg Oral Daily Chrystie Nose, MD   100 mg at 04/11/23 0919   nitroGLYCERIN (NITROSTAT) SL tablet 0.4 mg  0.4 mg Sublingual Q5 Min x 3 PRN Synetta Fail, MD       ondansetron [facility]) injection 4 mg  4 mg Intravenous Q6H PRN Synetta Fail, MD       pantoprazole (PROTONIX) EC tablet 40 mg  40 mg Oral Daily Synetta Fail, MD   40 mg at 04/11/23 0918   polyethylene glycol (MIRALAX / GLYCOLAX) packet 17 g  17 g Oral Daily PRN Marinda Elk, MD       QUEtiapine (SEROQUEL) tablet 25 mg  25 mg Oral QHS Marinda Elk, MD   25 mg at 04/10/23 1841   traZODone (DESYREL) tablet 150 mg  150 mg Oral QHS Synetta Fail, MD   150 mg at 04/10/23 2134     Discharge Medications: Please see discharge summary for a list of discharge medications.  Relevant Imaging Results:  Relevant Lab Results:   Additional Information SSN:[id number]  Reuel Boom Lesleyanne Politte, LCSW

## 2023-04-11 NOTE — Progress Notes (Signed)
   Stable today- plans for [facility] tomorrow. Echo showed LVEF 40-45% with inferoapical dyskinesis. Cath order placed. Please keep NPO p MN for cath tomorrow- probably will be add-on in the afternoon.  Chrystie Nose, MD, [facility], FACP  [place]  [facility]  Medical Director of the Advanced Lipid Disorders &  Cardiovascular Risk Reduction Clinic Diplomate of the American Board of Clinical Lipidology Attending Cardiologist  Direct Dial: [phone]  Fax: [phone]  Website:  [email or web address]

## 2023-04-11 NOTE — Progress Notes (Signed)
PHARMACY - ANTICOAGULATION CONSULT NOTE  Pharmacy Consult for heparin Indication: chest pain/ACS  Labs: Recent Labs    04/09/23 1747 04/10/23 0702 04/10/23 0743 04/10/23 2011 04/11/23 0423  HGB 12.9*  --   --   --  11.1*  HCT 40.5  --   --   --  34.0*  PLT 141*  --   --   --  121*  APTT 58*  --   --   --   --   HEPARINUNFRC 0.30  --  0.16* 0.21* 0.28*  CREATININE 1.03 0.97  --   --   --     Assessment: 72yo male subtherapeutic on heparin after rate change but approaching goal; no infusion issues or signs of bleeding per RN.  Goal of Therapy:  Heparin level 0.3-0.7 units/ml   Plan:  Increase heparin infusion by 2 units/kg/hr to 1800 units/hr. Check level in 6 hours.   Vernard Gambles, PharmD, BCPS 04/11/2023 5:30 AM

## 2023-04-12 ENCOUNTER — Encounter (HOSPITAL_COMMUNITY)
Admission: AD | Disposition: A | Discharge status: Skilled Nursing Facility | Payer: Self-pay | Source: Other Acute Inpatient Hospital | Attending: Internal Medicine

## 2023-04-12 DIAGNOSIS — I2510 Atherosclerotic heart disease of native coronary artery without angina pectoris: Secondary | ICD-10-CM | POA: Diagnosis not present

## 2023-04-12 DIAGNOSIS — I214 Non-ST elevation (NSTEMI) myocardial infarction: Secondary | ICD-10-CM | POA: Diagnosis not present

## 2023-04-12 HISTORY — PX: LEFT HEART CATH AND CORONARY ANGIOGRAPHY: CATH118249

## 2023-04-12 LAB — CBC
HCT: 34.3 % — ABNORMAL LOW (ref 39.0–52.0)
Hemoglobin: 11.1 g/dL — ABNORMAL LOW (ref 13.0–17.0)
MCH: 24.4 pg — ABNORMAL LOW (ref 26.0–34.0)
MCHC: 32.4 g/dL (ref 30.0–36.0)
MCV: 75.6 fL — ABNORMAL LOW (ref 80.0–100.0)
Platelets: 130 10*3/uL — ABNORMAL LOW (ref 150–400)
RBC: 4.54 MIL/uL (ref 4.22–5.81)
RDW: 16.6 % — ABNORMAL HIGH (ref 11.5–15.5)
WBC: 11.2 10*3/uL — ABNORMAL HIGH (ref 4.0–10.5)
nRBC: 0 % (ref 0.0–0.2)

## 2023-04-12 LAB — BASIC METABOLIC PANEL
Anion gap: 9 (ref 5–15)
BUN: 16 mg/dL (ref 8–23)
CO2: 24 mmol/L (ref 22–32)
Calcium: 8.3 mg/dL — ABNORMAL LOW (ref 8.9–10.3)
Chloride: 102 mmol/L (ref 98–111)
Creatinine, Ser: 0.73 mg/dL (ref 0.61–1.24)
GFR, Estimated: >60 mL/min (ref >60)
Glucose, Bld: 153 mg/dL — ABNORMAL HIGH (ref 70–99)
Potassium: 3.1 mmol/L — ABNORMAL LOW (ref 3.5–5.1)
Sodium: 135 mmol/L (ref 135–145)

## 2023-04-12 LAB — GLUCOSE, CAPILLARY
Glucose-Capillary: 139 mg/dL — ABNORMAL HIGH (ref 70–99)
Glucose-Capillary: 143 mg/dL — ABNORMAL HIGH (ref 70–99)
Glucose-Capillary: 138 mg/dL — ABNORMAL HIGH (ref 70–99)
Glucose-Capillary: 200 mg/dL — ABNORMAL HIGH (ref 70–99)

## 2023-04-12 LAB — HEPARIN LEVEL (UNFRACTIONATED): Heparin Unfractionated: 0.34 [IU]/mL (ref 0.30–0.70)

## 2023-04-12 LAB — URIC ACID: Uric Acid, Serum: 6.1 mg/dL (ref 3.7–8.6)

## 2023-04-12 LAB — LIPOPROTEIN A (LPA): Lipoprotein (a): 37.9 nmol/L — ABNORMAL HIGH (ref <75.0)

## 2023-04-12 SURGERY — LEFT HEART CATH AND CORONARY ANGIOGRAPHY
Anesthesia: LOCAL

## 2023-04-12 MED ORDER — LIDOCAINE HCL (PF) 1 % IJ SOLN
INTRAMUSCULAR | Status: DC | PRN
  Administered 2023-04-12: 2 mL

## 2023-04-12 MED ORDER — HYDRALAZINE HCL 20 MG/ML IJ SOLN: 10.0000 mg | INTRAMUSCULAR | Status: AC | PRN

## 2023-04-12 MED ORDER — VERAPAMIL HCL 2.5 MG/ML IV SOLN
INTRAVENOUS | Status: AC
  Filled 2023-04-12: qty 2

## 2023-04-12 MED ORDER — IOHEXOL 350 MG/ML SOLN
INTRAVENOUS | Status: DC | PRN
  Administered 2023-04-12: 55 mL

## 2023-04-12 MED ORDER — SODIUM CHLORIDE 0.9% FLUSH: 3.0000 mL | INTRAVENOUS | Status: DC | PRN

## 2023-04-12 MED ORDER — HEPARIN SODIUM (PORCINE) 1000 UNIT/ML IJ SOLN
INTRAMUSCULAR | Status: AC
  Filled 2023-04-12: qty 10

## 2023-04-12 MED ORDER — MIDAZOLAM HCL 2 MG/2ML IJ SOLN
INTRAMUSCULAR | Status: AC
Start: 2023-04-12 — End: ?
  Filled 2023-04-12: qty 2

## 2023-04-12 MED ORDER — HEPARIN SODIUM (PORCINE) 1000 UNIT/ML IJ SOLN
INTRAMUSCULAR | Status: DC | PRN
  Administered 2023-04-12: 5000 [IU] via INTRAVENOUS

## 2023-04-12 MED ORDER — POTASSIUM CHLORIDE CRYS ER 20 MEQ PO TBCR
40.0000 meq | EXTENDED_RELEASE_TABLET | Freq: Two times a day (BID) | ORAL | Status: AC
  Administered 2023-04-12 (×2): 40 meq via ORAL
  Filled 2023-04-12 (×2): qty 2

## 2023-04-12 MED ORDER — METHYLPREDNISOLONE SODIUM SUCC 125 MG IJ SOLR
125.0000 mg | Freq: Once | INTRAMUSCULAR | Status: AC
  Administered 2023-04-12: 125 mg via INTRAVENOUS
  Filled 2023-04-12: qty 2

## 2023-04-12 MED ORDER — LABETALOL HCL 5 MG/ML IV SOLN
10.0000 mg | INTRAVENOUS | Status: AC | PRN
Start: 2023-04-12 — End: 2023-04-13

## 2023-04-12 MED ORDER — DIPHENHYDRAMINE HCL 50 MG/ML IJ SOLN
25.0000 mg | Freq: Once | INTRAMUSCULAR | Status: AC
Start: 2023-04-12 — End: 2023-04-12
  Administered 2023-04-12: 25 mg via INTRAVENOUS
  Filled 2023-04-12: qty 1

## 2023-04-12 MED ORDER — FENTANYL CITRATE (PF) 100 MCG/2ML IJ SOLN
INTRAMUSCULAR | Status: AC
  Filled 2023-04-12: qty 2

## 2023-04-12 MED ORDER — FENTANYL CITRATE (PF) 100 MCG/2ML IJ SOLN
INTRAMUSCULAR | Status: DC | PRN
  Administered 2023-04-12: 25 ug via INTRAVENOUS

## 2023-04-12 MED ORDER — HEPARIN (PORCINE) 25000 UT/250ML-% IV SOLN
1850.0000 [IU]/h | INTRAVENOUS | Status: DC
  Administered 2023-04-12 – 2023-04-15 (×5): 1950 [IU]/h via INTRAVENOUS
  Administered 2023-04-15 – 2023-04-16 (×2): 1850 [IU]/h via INTRAVENOUS
  Filled 2023-04-12 (×7): qty 250

## 2023-04-12 MED ORDER — FUROSEMIDE 10 MG/ML IJ SOLN
40.0000 mg | Freq: Two times a day (BID) | INTRAMUSCULAR | Status: DC
  Administered 2023-04-12 – 2023-04-15 (×5): 40 mg via INTRAVENOUS
  Filled 2023-04-12 (×6): qty 4

## 2023-04-12 MED ORDER — MIDAZOLAM HCL 2 MG/2ML IJ SOLN
INTRAMUSCULAR | Status: DC | PRN
  Administered 2023-04-12: 1 mg via INTRAVENOUS

## 2023-04-12 MED ORDER — VERAPAMIL HCL 2.5 MG/ML IV SOLN
INTRAVENOUS | Status: DC | PRN
  Administered 2023-04-12: 10 mL via INTRA_ARTERIAL

## 2023-04-12 MED ORDER — SODIUM CHLORIDE 0.9% FLUSH
3.0000 mL | Freq: Two times a day (BID) | INTRAVENOUS | Status: DC
  Administered 2023-04-12 – 2023-04-20 (×13): 3 mL via INTRAVENOUS

## 2023-04-12 MED ORDER — SODIUM CHLORIDE 0.9 % IV SOLN
250.0000 mL | INTRAVENOUS | Status: AC | PRN
Start: 2023-04-12 — End: 2023-04-13

## 2023-04-12 MED ORDER — LIDOCAINE HCL (PF) 1 % IJ SOLN
INTRAMUSCULAR | Status: AC
  Filled 2023-04-12: qty 30

## 2023-04-12 MED ORDER — HEPARIN (PORCINE) IN NACL 1000-0.9 UT/500ML-% IV SOLN
INTRAVENOUS | Status: DC | PRN
  Administered 2023-04-12 (×2): 500 mL

## 2023-04-12 SURGICAL SUPPLY — 8 items
CATH INFINITI 5FR MULTPACK ANG (CATHETERS) IMPLANT
DEVICE RAD COMP TR BAND LRG (VASCULAR PRODUCTS) IMPLANT
GLIDESHEATH SLEND SS 6F .021 (SHEATH) IMPLANT
GUIDEWIRE INQWIRE 1.5J.035X260 (WIRE) IMPLANT
INQWIRE 1.5J .035X260CM (WIRE) ×1
PACK CARDIAC CATHETERIZATION (CUSTOM PROCEDURE TRAY) ×1 IMPLANT
SET ATX-X65L (MISCELLANEOUS) IMPLANT
SHEATH PROBE COVER 6X72 (BAG) IMPLANT

## 2023-04-12 NOTE — Progress Notes (Signed)
DAILY PROGRESS NOTE   Patient Name: Michael Rose Date of Encounter: 04/12/2023 Cardiologist: None  Chief Complaint   No chest pain overnight  Patient Profile   Michael Rose is a 72 y.o. male with a history of CAD with MI in 2008 s/p BMS to RCA, hypertension, hyperlipidemia, type 2 diabetes mellitus, GERD, anxiety/ depression, and bipolar affective disorder who is being seen 04/09/2023 for the evaluation of NSTEMI at the request of Dr. Geraldo Pitter.   Subjective   No events overnight - labs stable except potassium of 3.1. Plan for LHC today. Remains net negative.  Objective   Vitals:   04/11/23 1157 04/11/23 1642 04/11/23 2004 04/12/23 0334  BP: 113/71 107/75 114/70 121/79  Pulse: 89 (!) 53 91 93  Resp: 20 19 16 18   Temp: 99.3 F (37.4 C) 100.2 F (37.9 C) 100 F (37.8 C) 99.8 F (37.7 C)  TempSrc: Oral Oral Oral Oral  SpO2: 96% 98% 98%   Weight:      Height:        Intake/Output Summary (Last 24 hours) at 04/12/2023 0843 Last data filed at 04/12/2023 0800 Gross per 24 hour  Intake 897.8 ml  Output 2010 ml  Net -1112.2 ml   Filed Weights   04/10/23 0533  Weight: 97.4 kg    Physical Exam   General appearance: alert and no distress Neck: no carotid bruit, no JVD, and thyroid not enlarged, symmetric, no tenderness/mass/nodules Lungs: clear to auscultation bilaterally Heart: regular rate and rhythm Abdomen: soft, non-tender; bowel sounds normal; no masses,  no organomegaly Extremities: extremities normal, atraumatic, no cyanosis or edema Pulses: 2+ and symmetric Skin: Skin color, texture, turgor normal. No rashes or lesions Neurologic: Grossly normal Psych: Pleasant  Inpatient Medications    Scheduled Meds:  aspirin EC  81 mg Oral Daily   atorvastatin  40 mg Oral Daily   diclofenac Sodium  2 g Topical QID   feeding supplement (GLUCERNA SHAKE)  237 mL Oral TID BM   fluvoxaMINE  200 mg Oral Daily   furosemide  40 mg Oral Daily   insulin aspart  0-15  Units Subcutaneous TID WC   insulin aspart  0-5 Units Subcutaneous QHS   insulin aspart  4 Units Subcutaneous TID WC   insulin glargine-yfgn  5 Units Subcutaneous QHS   isosorbide mononitrate  30 mg Oral Daily   melatonin  10 mg Oral QHS   metoprolol succinate  100 mg Oral Daily   pantoprazole  40 mg Oral Daily   QUEtiapine  25 mg Oral QHS   sodium chloride flush  10 mL Intravenous Q12H   traZODone  150 mg Oral QHS    Continuous Infusions:  heparin 1,950 Units/hr (04/12/23 0704)    PRN Meds: acetaminophen, HYDROcodone-acetaminophen, nitroGLYCERIN, ondansetron (ZOFRAN) IV, polyethylene glycol   Labs   Results for orders placed or performed during the hospital encounter of 04/09/23 (from the past 48 hour(s))  Glucose, capillary     Status: Abnormal   Collection Time: 04/10/23 11:19 AM  Result Value Ref Range   Glucose-Capillary 137 (H) 70 - 99 mg/dL    Comment: Glucose reference range applies only to samples taken after fasting for at least 8 hours.  Glucose, capillary     Status: Abnormal   Collection Time: 04/10/23  4:17 PM  Result Value Ref Range   Glucose-Capillary 188 (H) 70 - 99 mg/dL    Comment: Glucose reference range applies only to samples taken after fasting for  at least 8 hours.  Heparin level (unfractionated)     Status: Abnormal   Collection Time: 04/10/23  8:11 PM  Result Value Ref Range   Heparin Unfractionated 0.21 (L) 0.30 - 0.70 IU/mL    Comment: (NOTE) The clinical reportable range upper limit is being lowered to >1.10 to align with the FDA approved guidance for the current laboratory assay.  If heparin results are below expected values, and patient dosage has  been confirmed, suggest follow up testing of antithrombin III levels. Performed at [facility], [address] [place], [place] [address]   Glucose, capillary     Status: Abnormal   Collection Time: 04/10/23  8:43 PM  Result Value Ref Range   Glucose-Capillary 153 (H) 70 - 99 mg/dL     Comment: Glucose reference range applies only to samples taken after fasting for at least 8 hours.  CBC     Status: Abnormal   Collection Time: 04/11/23  4:23 AM  Result Value Ref Range   WBC 10.2 4.0 - 10.5 K/uL   RBC 4.48 4.22 - 5.81 MIL/uL   Hemoglobin 11.1 (L) 13.0 - 17.0 g/dL   HCT 84.6 (L) 96.2 - 95.2 %   MCV 75.9 (L) 80.0 - 100.0 fL   MCH 24.8 (L) 26.0 - 34.0 pg   MCHC 32.6 30.0 - 36.0 g/dL   RDW 84.1 (H) 32.4 - 40.1 %   Platelets 121 (L) 150 - 400 K/uL    Comment: REPEATED TO VERIFY   nRBC 0.0 0.0 - 0.2 %    Comment: Performed at [facility], [address], [place], [place] [address]  Heparin level (unfractionated)     Status: Abnormal   Collection Time: 04/11/23  4:23 AM  Result Value Ref Range   Heparin Unfractionated 0.28 (L) 0.30 - 0.70 IU/mL    Comment: (NOTE) The clinical reportable range upper limit is being lowered to >1.10 to align with the FDA approved guidance for the current laboratory assay.  If heparin results are below expected values, and patient dosage has  been confirmed, suggest follow up testing of antithrombin III levels. Performed at [facility], [address], [place], [place] [address]   Glucose, capillary     Status: Abnormal   Collection Time: 04/11/23  7:52 AM  Result Value Ref Range   Glucose-Capillary 123 (H) 70 - 99 mg/dL    Comment: Glucose reference range applies only to samples taken after fasting for at least 8 hours.  Heparin level (unfractionated)     Status: Abnormal   Collection Time: 04/11/23 11:06 AM  Result Value Ref Range   Heparin Unfractionated 0.23 (L) 0.30 - 0.70 IU/mL    Comment: (NOTE) The clinical reportable range upper limit is being lowered to >1.10 to align with the FDA approved guidance for the current laboratory assay.  If heparin results are below expected values, and patient dosage has  been confirmed, suggest follow up testing of antithrombin III levels. Performed at [facility], [address], [place], [place] [address]   Glucose, capillary     Status: Abnormal   Collection Time: 04/11/23 11:54 AM  Result Value Ref Range   Glucose-Capillary 137 (H) 70 - 99 mg/dL    Comment: Glucose reference range applies only to samples taken after fasting for at least 8 hours.  Glucose, capillary     Status: Abnormal   Collection Time: 04/11/23  4:40 PM  Result Value Ref  Range   Glucose-Capillary 140 (H) 70 - 99 mg/dL    Comment: Glucose reference range applies only to samples taken after fasting for at least 8 hours.  Heparin level (unfractionated)     Status: None   Collection Time: 04/11/23  8:42 PM  Result Value Ref Range   Heparin Unfractionated 0.32 0.30 - 0.70 IU/mL    Comment: (NOTE) The clinical reportable range upper limit is being lowered to >1.10 to align with the FDA approved guidance for the current laboratory assay.  If heparin results are below expected values, and patient dosage has  been confirmed, suggest follow up testing of antithrombin III levels. Performed at [facility], [address] [place], [place] [address]   Glucose, capillary     Status: Abnormal   Collection Time: 04/11/23  9:04 PM  Result Value Ref Range   Glucose-Capillary 169 (H) 70 - 99 mg/dL    Comment: Glucose reference range applies only to samples taken after fasting for at least 8 hours.  CBC     Status: Abnormal   Collection Time: 04/12/23  5:18 AM  Result Value Ref Range   WBC 11.2 (H) 4.0 - 10.5 K/uL   RBC 4.54 4.22 - 5.81 MIL/uL   Hemoglobin 11.1 (L) 13.0 - 17.0 g/dL   HCT 91.4 (L) 78.2 - 95.6 %   MCV 75.6 (L) 80.0 - 100.0 fL   MCH 24.4 (L) 26.0 - 34.0 pg   MCHC 32.4 30.0 - 36.0 g/dL   RDW 21.3 (H) 08.6 - 57.8 %   Platelets 130 (L) 150 - 400 K/uL    Comment: REPEATED TO VERIFY   nRBC 0.0 0.0 - 0.2 %    Comment: Performed at [facility], [address], [place], [place] [address]  Uric acid     Status: None   Collection Time: 04/12/23  5:18 AM   Result Value Ref Range   Uric Acid, Serum 6.1 3.7 - 8.6 mg/dL    Comment: Performed at [facility], [address] [place], [place] [address]  Heparin level (unfractionated)     Status: None   Collection Time: 04/12/23  5:18 AM  Result Value Ref Range   Heparin Unfractionated 0.34 0.30 - 0.70 IU/mL    Comment: (NOTE) The clinical reportable range upper limit is being lowered to >1.10 to align with the FDA approved guidance for the current laboratory assay.  If heparin results are below expected values, and patient dosage has  been confirmed, suggest follow up testing of antithrombin III levels. Performed at [facility], [address] [place], [place] [address]   Basic metabolic panel     Status: Abnormal   Collection Time: 04/12/23  5:18 AM  Result Value Ref Range   Sodium 135 135 - 145 mmol/L   Potassium 3.1 (L) 3.5 - 5.1 mmol/L   Chloride 102 98 - 111 mmol/L   CO2 24 22 - 32 mmol/L   Glucose, Bld 153 (H) 70 - 99 mg/dL    Comment: Glucose reference range applies only to samples taken after fasting for at least 8 hours.   BUN 16 8 - 23 mg/dL   Creatinine, Ser 0.10 0.61 - 1.24 mg/dL   Calcium 8.3 (L) 8.9 - 10.3 mg/dL   GFR, Estimated >27 >25 mL/min    Comment: (NOTE) Calculated using the CKD-EPI Creatinine Equation (2021)    Anion gap 9 5 - 15    Comment: Performed at  [facility], [address], [place], [place] [address]  Glucose, capillary     Status: Abnormal   Collection Time: 04/12/23  7:36 AM  Result Value Ref Range   Glucose-Capillary 139 (H) 70 - 99 mg/dL    Comment: Glucose reference range applies only to samples taken after fasting for at least 8 hours.    ECG   N/A  Telemetry   SInus rhythm and sinus tach - Personally Reviewed  Radiology    DG Ankle 2 Views Left  Result Date: 04/11/2023 CLINICAL DATA:  Left ankle pain. EXAM: LEFT ANKLE - 2 VIEW COMPARISON:  None Available. FINDINGS: There is no evidence of acute fracture or  dislocation. Degenerative changes are noted at the ankle and midfoot. Calcaneal spurring is noted. There are enthesopathic changes at the insertion site of the Achilles tendon. Vascular calcifications are noted in the soft tissues. IMPRESSION: 1. No acute fracture or dislocation. 2. Scattered degenerative changes. Electronically Signed   By: Thornell Sartorius M.D.   On: 04/11/2023 02:21   ECHOCARDIOGRAM COMPLETE  Result Date: 04/10/2023    ECHOCARDIOGRAM REPORT   Patient Name:   TYQUON DISOTELL Myung Date of Exam: 04/10/2023 Medical Rec #:  [id number]      Height:       71.0 in Accession #:    [id number]     Weight:       214.7 lb Date of Birth:  08-03-1950       BSA:          2.173 m Patient Age:    72 years       BP:           129/71 mmHg Patient Gender: M              HR:           96 bpm. Exam Location:  Inpatient Procedure: 2D Echo, Color Doppler, Cardiac Doppler and Intracardiac            Opacification Agent Indications:    NSTEMI  History:        Patient has prior history of Echocardiogram examinations, most                 recent 11/20/2022. CAD and NSTEMI; Risk Factors:Hypertension,                 Dyslipidemia and Diabetes.  Sonographer:    [facility] Referring Phys: [id number] Cecille Po MELVIN IMPRESSIONS  1. No thrombus is seen (Definity contrast was used). Left ventricular ejection fraction, by estimation, is 40 to 45%. The left ventricle has mildly decreased function. The left ventricle demonstrates regional wall motion abnormalities (see scoring diagram/findings for description). Left ventricular diastolic parameters are consistent with Grade I diastolic dysfunction (impaired relaxation). A very small area of the inferoapical "cap" appears to be frankly dyskinetic.  2. Right ventricular systolic function is normal. The right ventricular size is normal. Tricuspid regurgitation signal is inadequate for assessing PA pressure.  3. Left atrial size was moderately dilated.  4. The mitral valve is normal in structure. Mild to  moderate mitral valve regurgitation.  5. The aortic valve is tricuspid. There is mild calcification of the aortic valve. There is mild thickening of the aortic valve. Aortic valve regurgitation is not visualized. Aortic valve sclerosis/calcification is present, without any evidence of aortic stenosis.  6. The inferior vena cava is dilated in size with <50% respiratory variability, suggesting right atrial pressure of 15 mmHg. Comparison(s): Prior  images reviewed side by side. The left ventricular function is worsened. The left ventricular wall motion is new. FINDINGS  Left Ventricle: No thrombus is seen (Definity contrast was used). Left ventricular ejection fraction, by estimation, is 40 to 45%. The left ventricle has mildly decreased function. The left ventricle demonstrates regional wall motion abnormalities. Definity contrast agent was given IV to delineate the left ventricular endocardial borders. The left ventricular internal cavity size was normal in size. There is no left ventricular hypertrophy. Left ventricular diastolic parameters are consistent with Grade I diastolic dysfunction (impaired relaxation). Indeterminate filling pressures.  LV Wall Scoring: The apex is dyskinetic. The entire inferior wall and posterior wall are hypokinetic. A very small area of the inferoapical "cap" appears to be frankly dyskinetic. Right Ventricle: The right ventricular size is normal. Right vetricular wall thickness was not well visualized. Right ventricular systolic function is normal. Tricuspid regurgitation signal is inadequate for assessing PA pressure. Left Atrium: Left atrial size was moderately dilated. Right Atrium: Right atrial size was normal in size. Pericardium: There is no evidence of pericardial effusion. Mitral Valve: The mitral valve is normal in structure. Mild to moderate mitral valve regurgitation, with centrally-directed jet. Tricuspid Valve: The tricuspid valve is normal in structure. Tricuspid valve  regurgitation is not demonstrated. Aortic Valve: The aortic valve is tricuspid. There is mild calcification of the aortic valve. There is mild thickening of the aortic valve. Aortic valve regurgitation is not visualized. Aortic valve sclerosis/calcification is present, without any evidence of aortic stenosis. Aortic valve mean gradient measures 6.0 mmHg. Aortic valve peak gradient measures 10.6 mmHg. Aortic valve area, by VTI measures 1.89 cm. Pulmonic Valve: The pulmonic valve was normal in structure. Pulmonic valve regurgitation is not visualized. Aorta: The aortic root is normal in size and structure. Venous: The inferior vena cava is dilated in size with less than 50% respiratory variability, suggesting right atrial pressure of 15 mmHg. IAS/Shunts: No atrial level shunt detected by color flow Doppler.  LEFT VENTRICLE PLAX 2D LVIDd:         5.50 cm   Diastology LVIDs:         4.30 cm   LV e' medial:    7.07 cm/s LV PW:         1.10 cm   LV E/e' medial:  13.0 LV IVS:        1.00 cm   LV e' lateral:   9.79 cm/s LVOT diam:     2.20 cm   LV E/e' lateral: 9.4 LV SV:         52 LV SV Index:   24 LVOT Area:     3.80 cm  RIGHT VENTRICLE RV Basal diam:  3.10 cm RV Mid diam:    1.70 cm RV S prime:     12.90 cm/s TAPSE (M-mode): 2.1 cm LEFT ATRIUM             Index        RIGHT ATRIUM           Index LA diam:        4.70 cm 2.16 cm/m   RA Area:     14.50 cm LA Vol (A2C):   68.2 ml 31.38 ml/m  RA Volume:   31.10 ml  14.31 ml/m LA Vol (A4C):   44.0 ml 20.25 ml/m LA Biplane Vol: 54.3 ml 24.99 ml/m  AORTIC VALVE AV Area (Vmax):    1.91 cm AV Area (Vmean):   1.82 cm AV Area (  VTI):     1.89 cm AV Vmax:           163.00 cm/s AV Vmean:          113.000 cm/s AV VTI:            0.275 m AV Peak Grad:      10.6 mmHg AV Mean Grad:      6.0 mmHg LVOT Vmax:         81.80 cm/s LVOT Vmean:        54.000 cm/s LVOT VTI:          0.137 m LVOT/AV VTI ratio: 0.50  AORTA Ao Root diam: 3.60 cm MITRAL VALVE MV Area (PHT): 4.29 cm      SHUNTS MV Decel Time: 177 msec     Systemic VTI:  0.14 m MV E velocity: 92.20 cm/s   Systemic Diam: 2.20 cm MV A velocity: 113.00 cm/s MV E/A ratio:  0.82 Mihai Croitoru MD Electronically signed by Thurmon Fair MD Signature Date/Time: 04/10/2023/6:09:56 PM    Final     Cardiac Studies   Echo pending  Assessment   Principal Problem:   NSTEMI (non-ST elevated myocardial infarction) (HCC) Active Problems:   Mixed hyperlipidemia   MORBID OBESITY   Essential hypertension, benign   CORONARY ATHEROSCLEROSIS NATIVE CORONARY ARTERY   Type 2 diabetes mellitus without complication, without long-term current use of insulin (HCC)   Benign prostatic hyperplasia   Anxiety and depression   Leukocytosis   Plan   Plan for LHC today- replete potassium. Echo showed LVEF 40-45% with regional WMA's.  Informed Consent   Shared Decision Making/Informed Consent The risks [stroke (1 in 1000), death (1 in 1000), kidney failure [usually temporary] (1 in 500), bleeding (1 in 200), allergic reaction [possibly serious] (1 in 200)], benefits (diagnostic support and management of coronary artery disease) and alternatives of a cardiac catheterization were discussed in detail with Mr. Robustelli and he is willing to proceed.     Time Spent Directly with Patient:  I have spent a total of 25 minutes with the patient reviewing hospital notes, telemetry, EKGs, labs and examining the patient as well as establishing an assessment and plan that was discussed personally with the patient.  > 50% of time was spent in direct patient care.  Length of Stay:  LOS: 3 days   Chrystie Nose, MD, [facility], FACP  [place]  [facility]  Medical Director of the Advanced Lipid Disorders &  Cardiovascular Risk Reduction Clinic Diplomate of the American Board of Clinical Lipidology Attending Cardiologist  Direct Dial: [phone]  Fax: [phone]  Website:  [email or web address] 04/12/2023, 8:43 AM

## 2023-04-12 NOTE — Progress Notes (Signed)
PHARMACY - ANTICOAGULATION CONSULT NOTE  Pharmacy Consult for IV heparin Indication: chest pain/ACS  Allergies  Allergen Reactions   Iodinated Contrast Media Hives and Itching   Penicillins Other (See Comments)    Unknown reaction in childhood    Patient Measurements: Height: 5\' 11"  (180.3 cm) Weight: 97.4 kg (214 lb 11.2 oz) IBW/kg (Calculated) : 75.3 Heparin Dosing Weight: 95.1 kg  Vital Signs: Temp: 98.8 F (37.1 C) (11/11 1159) Temp Source: Oral (11/11 1159) BP: 119/82 (11/11 1827) Pulse Rate: 100 (11/11 1827)  Labs: Recent Labs    04/10/23 0702 04/10/23 0743 04/11/23 0423 04/11/23 1106 04/11/23 2042 04/12/23 0518  HGB  --   --  11.1*  --   --  11.1*  HCT  --   --  34.0*  --   --  34.3*  PLT  --   --  121*  --   --  130*  HEPARINUNFRC  --    < > 0.28* 0.23* 0.32 0.34  CREATININE 0.97  --   --   --   --  0.73   < > = values in this interval not displayed.    Estimated Creatinine Clearance: 99.3 mL/min (by C-G formula based on SCr of 0.73 mg/dL).   Medical History: Past Medical History:  Diagnosis Date   Allergic reaction to contrast dye    Bipolar affective disorder (HCC)    Coronary atherosclerosis of native coronary artery    BMS to RCA 2008, [facility], Dr. 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 No AC PTA.  Heparin level was previously therapeutic at 0.34, on 1950 units/hr. Underwent cardiac cath finding multivessel CAD with plans for TCTS consult - okay to restart heparin 2 hours after TR band removed. TR band was accidentally removed earlier by patient ~1945 - was assess  by cath lab staff and Tegaderm was placed on it. Confirmed with fellow okay to still plan for 2 hours after it was removed since stable without hematoma.   Goal of Therapy:  Heparin level 0.3-0.7 units/ml Monitor platelets by anticoagulation protocol: Yes   Plan:  Restart heparin infusion at 1950 units/hr on 11/11@2200  Daily heparin level and CBC  Thank you for allowing pharmacy to participate in this patient's care,  Sherron Monday, PharmD, BCCCP Clinical Pharmacist  Phone: [phone] 04/12/2023 6:44 PM  Please check AMION for all [facility] Pharmacy phone numbers After 10:00 PM, call Main Pharmacy [phone]

## 2023-04-12 NOTE — Plan of Care (Signed)
  Problem: Education: Goal: Knowledge of General Education information will improve Description: Including pain rating scale, medication(s)/side effects and non-pharmacologic comfort measures Outcome: Progressing   Problem: Health Behavior/Discharge Planning: Goal: Ability to manage health-related needs will improve Outcome: Progressing   Problem: Clinical Measurements: Goal: Ability to maintain clinical measurements within normal limits will improve Outcome: Progressing Goal: Will remain free from infection Outcome: Progressing Goal: Diagnostic test results will improve Outcome: Progressing Goal: Respiratory complications will improve Outcome: Progressing Goal: Cardiovascular complication will be avoided Outcome: Progressing   Problem: Activity: Goal: Risk for activity intolerance will decrease Outcome: Progressing   Problem: Nutrition: Goal: Adequate nutrition will be maintained Outcome: Progressing   Problem: Coping: Goal: Level of anxiety will decrease Outcome: Progressing   Problem: Elimination: Goal: Will not experience complications related to bowel motility Outcome: Progressing Goal: Will not experience complications related to urinary retention Outcome: Progressing   Problem: Pain Management: Goal: General experience of comfort will improve Outcome: Progressing   Problem: Safety: Goal: Ability to remain free from injury will improve Outcome: Progressing   Problem: Skin Integrity: Goal: Risk for impaired skin integrity will decrease Outcome: Progressing   Problem: Education: Goal: Understanding of cardiac disease, CV risk reduction, and recovery process will improve Outcome: Progressing Goal: Individualized Educational Video(s) Outcome: Progressing   Problem: Activity: Goal: Ability to tolerate increased activity will improve Outcome: Progressing   Problem: Cardiac: Goal: Ability to achieve and maintain adequate cardiovascular perfusion will  improve Outcome: Progressing   Problem: Education: Goal: Ability to describe self-care measures that may prevent or decrease complications (Diabetes Survival Skills Education) will improve Outcome: Progressing Goal: Individualized Educational Video(s) Outcome: Progressing   Problem: Coping: Goal: Ability to adjust to condition or change in health will improve Outcome: Progressing   Problem: Fluid Volume: Goal: Ability to maintain a balanced intake and output will improve Outcome: Progressing   Problem: Health Behavior/Discharge Planning: Goal: Ability to identify and utilize available resources and services will improve Outcome: Progressing Goal: Ability to manage health-related needs will improve Outcome: Progressing   Problem: Metabolic: Goal: Ability to maintain appropriate glucose levels will improve Outcome: Progressing   Problem: Nutritional: Goal: Maintenance of adequate nutrition will improve Outcome: Progressing Goal: Progress toward achieving an optimal weight will improve Outcome: Progressing   Problem: Skin Integrity: Goal: Risk for impaired skin integrity will decrease Outcome: Progressing   Problem: Tissue Perfusion: Goal: Adequacy of tissue perfusion will improve Outcome: Progressing   Problem: Education: Goal: Understanding of CV disease, CV risk reduction, and recovery process will improve Outcome: Progressing Goal: Individualized Educational Video(s) Outcome: Progressing   Problem: Activity: Goal: Ability to return to baseline activity level will improve Outcome: Progressing   Problem: Cardiovascular: Goal: Ability to achieve and maintain adequate cardiovascular perfusion will improve Outcome: Progressing Goal: Vascular access site(s) Level 0-1 will be maintained Outcome: Progressing   Problem: Health Behavior/Discharge Planning: Goal: Ability to safely manage health-related needs after discharge will improve Outcome: Progressing

## 2023-04-12 NOTE — Progress Notes (Signed)
Occupational Therapy Treatment Patient Details Name: Michael Rose MRN: [id number] DOB: 07-16-1950 Today's Date: 04/12/2023   History of present illness Pt is 72 year old presented to [facility] on  04/09/23 from [facility] for likely viral gastroenteritis and encephalopathy in setting on profound hypokalemia. Pt with elevated troponin and unclear if NSTEMI vs demand ischemia. Cardiology consulted with planned cardiac cath 11/11.  PMH - rt femur fx with ORIF 10/2022, CAD s/p stenting in 2008, bil TKR, bipolar, HTN, IIDM, anxiety/depression.   OT comments  Pt with slow progress towards goals due to pain (L ankle and bottom) and anxiety regarding activity. Pt declined EOB/OOB attempts despite encouragement. Emphasized UB ADLs that pt is able to assist with at Setup Assist bed level, as well as therapeutic exercises and AROM that can be done bed level to prevent further weakness. Patient will benefit from continued inpatient follow up therapy, <3 hours/day at DC.       If plan is discharge home, recommend the following:  Two people to help with walking and/or transfers;A lot of help with bathing/dressing/bathroom;Assistance with cooking/housework;Assist for transportation;Help with stairs or ramp for entrance;Direct supervision/assist for medications management;Direct supervision/assist for financial management   Equipment Recommendations  Other (comment) (TBD pending progress)    Recommendations for Other Services      Precautions / Restrictions Precautions Precautions: Fall Restrictions Weight Bearing Restrictions: No       Mobility Bed Mobility Overal bed mobility: Needs Assistance             General bed mobility comments: declined EOB multiple times. able to scoot self up in bed, long sit with use of bed rails at Min A    Transfers                   General transfer comment: declined     Balance                                           ADL  either performed or assessed with clinical judgement   ADL Overall ADL's : Needs assistance/impaired     Grooming: Set up;Bed level;Wash/dry face   Upper Body Bathing: Set up;Bed level Upper Body Bathing Details (indicate cue type and reason): able to easily reach majority of areas bed level. declined EOB                           General ADL Comments: Emphasis on gentle AROM bed level, therapeutic exercises bed level to assist in preventing further weakness due to decreased OOB activity    Extremity/Trunk Assessment Upper Extremity Assessment Upper Extremity Assessment: Overall WFL for tasks assessed;Right hand dominant;RUE deficits/detail RUE Deficits / Details: Hand strength 4-/5, all other strength largerly 4+/5; ROM WFL; decreased fine motor coordination; pt reports numbness on ulnar side of hand since recent fall when he landed on his hand - MD aware RUE Sensation: decreased light touch RUE Coordination: decreased fine motor LUE Deficits / Details: Hand strength 4-/5, all other strength largerly 4+/5; ROM WFL; decreased fine motor coordination LUE Coordination: decreased fine motor   Lower Extremity Assessment Lower Extremity Assessment: Defer to PT evaluation        Vision   Vision Assessment?: No apparent visual deficits   Perception     Praxis      Cognition Arousal: Alert Behavior  During Therapy: Anxious, WFL for tasks assessed/performed Overall Cognitive Status: No family/caregiver present to determine baseline cognitive functioning Area of Impairment: Attention, Safety/judgement, Awareness, Problem solving, Following commands                   Current Attention Level: Sustained   Following Commands: Follows one step commands consistently, Follows one step commands with increased time Safety/Judgement: Decreased awareness of safety, Decreased awareness of deficits Awareness: Emergent Problem Solving: Slow processing, Difficulty sequencing,  Requires verbal cues General Comments: Pt with poor attention and very anxious. follows one step commands consistently but does benefit from cues for problem solving and sequencing        Exercises      Shoulder Instructions       General Comments Assisted to call wife during session who also encouraged pt activity    Pertinent Vitals/ Pain       Pain Assessment Pain Assessment: Faces Faces Pain Scale: Hurts even more Pain Location: L ankle, bottom Pain Descriptors / Indicators: Grimacing, Guarding, Sore Pain Intervention(s): Monitored during session, Limited activity within patient's tolerance, Patient requesting pain meds-RN notified, Other (comment) (reapplied ice pack to L ankle at end of session)  Home Living                                          Prior Functioning/Environment              Frequency  Min 1X/week        Progress Toward Goals  OT Goals(current goals can now be found in the care plan section)  Progress towards OT goals: OT to reassess next treatment  Acute Rehab OT Goals Patient Stated Goal: decrease L ankle pain, get heart cath OT Goal Formulation: With patient Time For Goal Achievement: 04/24/23 Potential to Achieve Goals: Good ADL Goals Pt Will Perform Grooming: with contact guard assist;standing Pt Will Perform Lower Body Bathing: with contact guard assist;with adaptive equipment;sitting/lateral leans;sit to/from stand Pt Will Perform Lower Body Dressing: with contact guard assist;with adaptive equipment;sitting/lateral leans;sit to/from stand Pt Will Transfer to Toilet: with contact guard assist;ambulating;bedside commode Pt Will Perform Toileting - Clothing Manipulation and hygiene: sitting/lateral leans;sit to/from stand;with min assist Pt/caregiver will Perform Home Exercise Program: Increased strength;With theraputty;With Supervision;With written HEP provided  Plan      Co-evaluation                  AM-PAC OT "6 Clicks" Daily Activity     Outcome Measure   Help from another person eating meals?: A Little Help from another person taking care of personal grooming?: A Little Help from another person toileting, which includes using toliet, bedpan, or urinal?: A Lot Help from another person bathing (including washing, rinsing, drying)?: A Lot Help from another person to put on and taking off regular upper body clothing?: A Little Help from another person to put on and taking off regular lower body clothing?: Total 6 Click Score: 14    End of Session    OT Visit Diagnosis: Pain;Other (comment);Other abnormalities of gait and mobility (R26.89);Ataxia, unspecified (R27.0)   Activity Tolerance Patient limited by pain;Other (comment) (limited by anxiety)   Patient Left in bed;with call bell/phone within reach;with bed alarm set   Nurse Communication Mobility status;Patient requests pain meds        Time: 1610-9604 OT Time Calculation (min): 16 min  Charges: OT General Charges $OT Visit: 1 Visit OT Treatments $Self Care/Home Management : 8-22 mins  Bradd Canary, OTR/L Acute Rehab Services Office: [phone]   Lorre Munroe 04/12/2023, 8:51 AM

## 2023-04-12 NOTE — H&P (View-Only) (Signed)
DAILY PROGRESS NOTE   Patient Name: Michael Rose Date of Encounter: 04/12/2023 Cardiologist: None  Chief Complaint   No chest pain overnight  Patient Profile   Michael Rose is a 72 y.o. male with a history of CAD with MI in 2008 s/p BMS to RCA, hypertension, hyperlipidemia, type 2 diabetes mellitus, GERD, anxiety/ depression, and bipolar affective disorder who is being seen 04/09/2023 for the evaluation of NSTEMI at the request of Dr. Geraldo Pitter.   Subjective   No events overnight - labs stable except potassium of 3.1. Plan for LHC today. Remains net negative.  Objective   Vitals:   04/11/23 1157 04/11/23 1642 04/11/23 2004 04/12/23 0334  BP: 113/71 107/75 114/70 121/79  Pulse: 89 (!) 53 91 93  Resp: 20 19 16 18   Temp: 99.3 F (37.4 C) 100.2 F (37.9 C) 100 F (37.8 C) 99.8 F (37.7 C)  TempSrc: Oral Oral Oral Oral  SpO2: 96% 98% 98%   Weight:      Height:        Intake/Output Summary (Last 24 hours) at 04/12/2023 0843 Last data filed at 04/12/2023 0800 Gross per 24 hour  Intake 897.8 ml  Output 2010 ml  Net -1112.2 ml   Filed Weights   04/10/23 0533  Weight: 97.4 kg    Physical Exam   General appearance: alert and no distress Neck: no carotid bruit, no JVD, and thyroid not enlarged, symmetric, no tenderness/mass/nodules Lungs: clear to auscultation bilaterally Heart: regular rate and rhythm Abdomen: soft, non-tender; bowel sounds normal; no masses,  no organomegaly Extremities: extremities normal, atraumatic, no cyanosis or edema Pulses: 2+ and symmetric Skin: Skin color, texture, turgor normal. No rashes or lesions Neurologic: Grossly normal Psych: Pleasant  Inpatient Medications    Scheduled Meds:  aspirin EC  81 mg Oral Daily   atorvastatin  40 mg Oral Daily   diclofenac Sodium  2 g Topical QID   feeding supplement (GLUCERNA SHAKE)  237 mL Oral TID BM   fluvoxaMINE  200 mg Oral Daily   furosemide  40 mg Oral Daily   insulin aspart  0-15  Units Subcutaneous TID WC   insulin aspart  0-5 Units Subcutaneous QHS   insulin aspart  4 Units Subcutaneous TID WC   insulin glargine-yfgn  5 Units Subcutaneous QHS   isosorbide mononitrate  30 mg Oral Daily   melatonin  10 mg Oral QHS   metoprolol succinate  100 mg Oral Daily   pantoprazole  40 mg Oral Daily   QUEtiapine  25 mg Oral QHS   sodium chloride flush  10 mL Intravenous Q12H   traZODone  150 mg Oral QHS    Continuous Infusions:  heparin 1,950 Units/hr (04/12/23 0704)    PRN Meds: acetaminophen, HYDROcodone-acetaminophen, nitroGLYCERIN, ondansetron (ZOFRAN) IV, polyethylene glycol   Labs   Results for orders placed or performed during the hospital encounter of 04/09/23 (from the past 48 hour(s))  Glucose, capillary     Status: Abnormal   Collection Time: 04/10/23 11:19 AM  Result Value Ref Range   Glucose-Capillary 137 (H) 70 - 99 mg/dL    Comment: Glucose reference range applies only to samples taken after fasting for at least 8 hours.  Glucose, capillary     Status: Abnormal   Collection Time: 04/10/23  4:17 PM  Result Value Ref Range   Glucose-Capillary 188 (H) 70 - 99 mg/dL    Comment: Glucose reference range applies only to samples taken after fasting for  at least 8 hours.  Heparin level (unfractionated)     Status: Abnormal   Collection Time: 04/10/23  8:11 PM  Result Value Ref Range   Heparin Unfractionated 0.21 (L) 0.30 - 0.70 IU/mL    Comment: (NOTE) The clinical reportable range upper limit is being lowered to >1.10 to align with the FDA approved guidance for the current laboratory assay.  If heparin results are below expected values, and patient dosage has  been confirmed, suggest follow up testing of antithrombin III levels. Performed at [facility], [address] [place], [place] [address]   Glucose, capillary     Status: Abnormal   Collection Time: 04/10/23  8:43 PM  Result Value Ref Range   Glucose-Capillary 153 (H) 70 - 99 mg/dL     Comment: Glucose reference range applies only to samples taken after fasting for at least 8 hours.  CBC     Status: Abnormal   Collection Time: 04/11/23  4:23 AM  Result Value Ref Range   WBC 10.2 4.0 - 10.5 K/uL   RBC 4.48 4.22 - 5.81 MIL/uL   Hemoglobin 11.1 (L) 13.0 - 17.0 g/dL   HCT 84.6 (L) 96.2 - 95.2 %   MCV 75.9 (L) 80.0 - 100.0 fL   MCH 24.8 (L) 26.0 - 34.0 pg   MCHC 32.6 30.0 - 36.0 g/dL   RDW 84.1 (H) 32.4 - 40.1 %   Platelets 121 (L) 150 - 400 K/uL    Comment: REPEATED TO VERIFY   nRBC 0.0 0.0 - 0.2 %    Comment: Performed at [facility], [address], [place], [place] [address]  Heparin level (unfractionated)     Status: Abnormal   Collection Time: 04/11/23  4:23 AM  Result Value Ref Range   Heparin Unfractionated 0.28 (L) 0.30 - 0.70 IU/mL    Comment: (NOTE) The clinical reportable range upper limit is being lowered to >1.10 to align with the FDA approved guidance for the current laboratory assay.  If heparin results are below expected values, and patient dosage has  been confirmed, suggest follow up testing of antithrombin III levels. Performed at [facility], [address], [place], [place] [address]   Glucose, capillary     Status: Abnormal   Collection Time: 04/11/23  7:52 AM  Result Value Ref Range   Glucose-Capillary 123 (H) 70 - 99 mg/dL    Comment: Glucose reference range applies only to samples taken after fasting for at least 8 hours.  Heparin level (unfractionated)     Status: Abnormal   Collection Time: 04/11/23 11:06 AM  Result Value Ref Range   Heparin Unfractionated 0.23 (L) 0.30 - 0.70 IU/mL    Comment: (NOTE) The clinical reportable range upper limit is being lowered to >1.10 to align with the FDA approved guidance for the current laboratory assay.  If heparin results are below expected values, and patient dosage has  been confirmed, suggest follow up testing of antithrombin III levels. Performed at [facility]  Lab, [address], [place], [place] [address]   Glucose, capillary     Status: Abnormal   Collection Time: 04/11/23 11:54 AM  Result Value Ref Range   Glucose-Capillary 137 (H) 70 - 99 mg/dL    Comment: Glucose reference range applies only to samples taken after fasting for at least 8 hours.  Glucose, capillary     Status: Abnormal   Collection Time: 04/11/23  4:40 PM  Result Value Ref  Range   Glucose-Capillary 140 (H) 70 - 99 mg/dL    Comment: Glucose reference range applies only to samples taken after fasting for at least 8 hours.  Heparin level (unfractionated)     Status: None   Collection Time: 04/11/23  8:42 PM  Result Value Ref Range   Heparin Unfractionated 0.32 0.30 - 0.70 IU/mL    Comment: (NOTE) The clinical reportable range upper limit is being lowered to >1.10 to align with the FDA approved guidance for the current laboratory assay.  If heparin results are below expected values, and patient dosage has  been confirmed, suggest follow up testing of antithrombin III levels. Performed at [facility], [address] [place], [place] [address]   Glucose, capillary     Status: Abnormal   Collection Time: 04/11/23  9:04 PM  Result Value Ref Range   Glucose-Capillary 169 (H) 70 - 99 mg/dL    Comment: Glucose reference range applies only to samples taken after fasting for at least 8 hours.  CBC     Status: Abnormal   Collection Time: 04/12/23  5:18 AM  Result Value Ref Range   WBC 11.2 (H) 4.0 - 10.5 K/uL   RBC 4.54 4.22 - 5.81 MIL/uL   Hemoglobin 11.1 (L) 13.0 - 17.0 g/dL   HCT 91.4 (L) 78.2 - 95.6 %   MCV 75.6 (L) 80.0 - 100.0 fL   MCH 24.4 (L) 26.0 - 34.0 pg   MCHC 32.4 30.0 - 36.0 g/dL   RDW 21.3 (H) 08.6 - 57.8 %   Platelets 130 (L) 150 - 400 K/uL    Comment: REPEATED TO VERIFY   nRBC 0.0 0.0 - 0.2 %    Comment: Performed at [facility] Lab, [address], [place], [place] [address]  Uric acid     Status: None   Collection Time: 04/12/23  5:18 AM   Result Value Ref Range   Uric Acid, Serum 6.1 3.7 - 8.6 mg/dL    Comment: Performed at [facility], [address] [place], [place] [address]  Heparin level (unfractionated)     Status: None   Collection Time: 04/12/23  5:18 AM  Result Value Ref Range   Heparin Unfractionated 0.34 0.30 - 0.70 IU/mL    Comment: (NOTE) The clinical reportable range upper limit is being lowered to >1.10 to align with the FDA approved guidance for the current laboratory assay.  If heparin results are below expected values, and patient dosage has  been confirmed, suggest follow up testing of antithrombin III levels. Performed at [facility], [address] [place], [place] [address]   Basic metabolic panel     Status: Abnormal   Collection Time: 04/12/23  5:18 AM  Result Value Ref Range   Sodium 135 135 - 145 mmol/L   Potassium 3.1 (L) 3.5 - 5.1 mmol/L   Chloride 102 98 - 111 mmol/L   CO2 24 22 - 32 mmol/L   Glucose, Bld 153 (H) 70 - 99 mg/dL    Comment: Glucose reference range applies only to samples taken after fasting for at least 8 hours.   BUN 16 8 - 23 mg/dL   Creatinine, Ser 0.10 0.61 - 1.24 mg/dL   Calcium 8.3 (L) 8.9 - 10.3 mg/dL   GFR, Estimated >27 >25 mL/min    Comment: (NOTE) Calculated using the CKD-EPI Creatinine Equation (2021)    Anion gap 9 5 - 15    Comment: Performed at  [facility], [address], [place], [place] [address]  Glucose, capillary     Status: Abnormal   Collection Time: 04/12/23  7:36 AM  Result Value Ref Range   Glucose-Capillary 139 (H) 70 - 99 mg/dL    Comment: Glucose reference range applies only to samples taken after fasting for at least 8 hours.    ECG   N/A  Telemetry   SInus rhythm and sinus tach - Personally Reviewed  Radiology    DG Ankle 2 Views Left  Result Date: 04/11/2023 CLINICAL DATA:  Left ankle pain. EXAM: LEFT ANKLE - 2 VIEW COMPARISON:  None Available. FINDINGS: There is no evidence of acute fracture or  dislocation. Degenerative changes are noted at the ankle and midfoot. Calcaneal spurring is noted. There are enthesopathic changes at the insertion site of the Achilles tendon. Vascular calcifications are noted in the soft tissues. IMPRESSION: 1. No acute fracture or dislocation. 2. Scattered degenerative changes. Electronically Signed   By: Thornell Sartorius M.D.   On: 04/11/2023 02:21   ECHOCARDIOGRAM COMPLETE  Result Date: 04/10/2023    ECHOCARDIOGRAM REPORT   Patient Name:   Michael Rose Myung Date of Exam: 04/10/2023 Medical Rec #:  [id number]      Height:       71.0 in Accession #:    [id number]     Weight:       214.7 lb Date of Birth:  08-03-1950       BSA:          2.173 m Patient Age:    72 years       BP:           129/71 mmHg Patient Gender: M              HR:           96 bpm. Exam Location:  Inpatient Procedure: 2D Echo, Color Doppler, Cardiac Doppler and Intracardiac            Opacification Agent Indications:    NSTEMI  History:        Patient has prior history of Echocardiogram examinations, most                 recent 11/20/2022. CAD and NSTEMI; Risk Factors:Hypertension,                 Dyslipidemia and Diabetes.  Sonographer:    [facility] Referring Phys: [id number] Cecille Po MELVIN IMPRESSIONS  1. No thrombus is seen (Definity contrast was used). Left ventricular ejection fraction, by estimation, is 40 to 45%. The left ventricle has mildly decreased function. The left ventricle demonstrates regional wall motion abnormalities (see scoring diagram/findings for description). Left ventricular diastolic parameters are consistent with Grade I diastolic dysfunction (impaired relaxation). A very small area of the inferoapical "cap" appears to be frankly dyskinetic.  2. Right ventricular systolic function is normal. The right ventricular size is normal. Tricuspid regurgitation signal is inadequate for assessing PA pressure.  3. Left atrial size was moderately dilated.  4. The mitral valve is normal in structure. Mild to  moderate mitral valve regurgitation.  5. The aortic valve is tricuspid. There is mild calcification of the aortic valve. There is mild thickening of the aortic valve. Aortic valve regurgitation is not visualized. Aortic valve sclerosis/calcification is present, without any evidence of aortic stenosis.  6. The inferior vena cava is dilated in size with <50% respiratory variability, suggesting right atrial pressure of 15 mmHg. Comparison(s): Prior  images reviewed side by side. The left ventricular function is worsened. The left ventricular wall motion is new. FINDINGS  Left Ventricle: No thrombus is seen (Definity contrast was used). Left ventricular ejection fraction, by estimation, is 40 to 45%. The left ventricle has mildly decreased function. The left ventricle demonstrates regional wall motion abnormalities. Definity contrast agent was given IV to delineate the left ventricular endocardial borders. The left ventricular internal cavity size was normal in size. There is no left ventricular hypertrophy. Left ventricular diastolic parameters are consistent with Grade I diastolic dysfunction (impaired relaxation). Indeterminate filling pressures.  LV Wall Scoring: The apex is dyskinetic. The entire inferior wall and posterior wall are hypokinetic. A very small area of the inferoapical "cap" appears to be frankly dyskinetic. Right Ventricle: The right ventricular size is normal. Right vetricular wall thickness was not well visualized. Right ventricular systolic function is normal. Tricuspid regurgitation signal is inadequate for assessing PA pressure. Left Atrium: Left atrial size was moderately dilated. Right Atrium: Right atrial size was normal in size. Pericardium: There is no evidence of pericardial effusion. Mitral Valve: The mitral valve is normal in structure. Mild to moderate mitral valve regurgitation, with centrally-directed jet. Tricuspid Valve: The tricuspid valve is normal in structure. Tricuspid valve  regurgitation is not demonstrated. Aortic Valve: The aortic valve is tricuspid. There is mild calcification of the aortic valve. There is mild thickening of the aortic valve. Aortic valve regurgitation is not visualized. Aortic valve sclerosis/calcification is present, without any evidence of aortic stenosis. Aortic valve mean gradient measures 6.0 mmHg. Aortic valve peak gradient measures 10.6 mmHg. Aortic valve area, by VTI measures 1.89 cm. Pulmonic Valve: The pulmonic valve was normal in structure. Pulmonic valve regurgitation is not visualized. Aorta: The aortic root is normal in size and structure. Venous: The inferior vena cava is dilated in size with less than 50% respiratory variability, suggesting right atrial pressure of 15 mmHg. IAS/Shunts: No atrial level shunt detected by color flow Doppler.  LEFT VENTRICLE PLAX 2D LVIDd:         5.50 cm   Diastology LVIDs:         4.30 cm   LV e' medial:    7.07 cm/s LV PW:         1.10 cm   LV E/e' medial:  13.0 LV IVS:        1.00 cm   LV e' lateral:   9.79 cm/s LVOT diam:     2.20 cm   LV E/e' lateral: 9.4 LV SV:         52 LV SV Index:   24 LVOT Area:     3.80 cm  RIGHT VENTRICLE RV Basal diam:  3.10 cm RV Mid diam:    1.70 cm RV S prime:     12.90 cm/s TAPSE (M-mode): 2.1 cm LEFT ATRIUM             Index        RIGHT ATRIUM           Index LA diam:        4.70 cm 2.16 cm/m   RA Area:     14.50 cm LA Vol (A2C):   68.2 ml 31.38 ml/m  RA Volume:   31.10 ml  14.31 ml/m LA Vol (A4C):   44.0 ml 20.25 ml/m LA Biplane Vol: 54.3 ml 24.99 ml/m  AORTIC VALVE AV Area (Vmax):    1.91 cm AV Area (Vmean):   1.82 cm AV Area (  VTI):     1.89 cm AV Vmax:           163.00 cm/s AV Vmean:          113.000 cm/s AV VTI:            0.275 m AV Peak Grad:      10.6 mmHg AV Mean Grad:      6.0 mmHg LVOT Vmax:         81.80 cm/s LVOT Vmean:        54.000 cm/s LVOT VTI:          0.137 m LVOT/AV VTI ratio: 0.50  AORTA Ao Root diam: 3.60 cm MITRAL VALVE MV Area (PHT): 4.29 cm      SHUNTS MV Decel Time: 177 msec     Systemic VTI:  0.14 m MV E velocity: 92.20 cm/s   Systemic Diam: 2.20 cm MV A velocity: 113.00 cm/s MV E/A ratio:  0.82 Mihai Croitoru MD Electronically signed by Thurmon Fair MD Signature Date/Time: 04/10/2023/6:09:56 PM    Final     Cardiac Studies   Echo pending  Assessment   Principal Problem:   NSTEMI (non-ST elevated myocardial infarction) (HCC) Active Problems:   Mixed hyperlipidemia   MORBID OBESITY   Essential hypertension, benign   CORONARY ATHEROSCLEROSIS NATIVE CORONARY ARTERY   Type 2 diabetes mellitus without complication, without long-term current use of insulin (HCC)   Benign prostatic hyperplasia   Anxiety and depression   Leukocytosis   Plan   Plan for LHC today- replete potassium. Echo showed LVEF 40-45% with regional WMA's.  Informed Consent   Shared Decision Making/Informed Consent The risks [stroke (1 in 1000), death (1 in 1000), kidney failure [usually temporary] (1 in 500), bleeding (1 in 200), allergic reaction [possibly serious] (1 in 200)], benefits (diagnostic support and management of coronary artery disease) and alternatives of a cardiac catheterization were discussed in detail with Michael Rose and he is willing to proceed.     Time Spent Directly with Patient:  I have spent a total of 25 minutes with the patient reviewing hospital notes, telemetry, EKGs, labs and examining the patient as well as establishing an assessment and plan that was discussed personally with the patient.  > 50% of time was spent in direct patient care.  Length of Stay:  LOS: 3 days   Chrystie Nose, MD, [facility], FACP  [place]  [facility]  Medical Director of the Advanced Lipid Disorders &  Cardiovascular Risk Reduction Clinic Diplomate of the American Board of Clinical Lipidology Attending Cardiologist  Direct Dial: [phone]  Fax: [phone]  Website:  [email or web address] 04/12/2023, 8:43 AM

## 2023-04-12 NOTE — TOC Progression Note (Addendum)
Transition of Care [facility]) - Progression Note    Patient Details  Name: Michael Rose MRN: [id number] Date of Birth: 1951/02/19  Transition of Care [facility]) CM/SW Contact  Delilah Shan, LCSWA Phone Number: 04/12/2023, 12:00 PM  Clinical Narrative:     CSW provided SNF bed offers. Patient accepted SNF bed offer with [facility]. CSW spoke with Jill Side with [facility] rehab who confirmed SNF bed for patient. CSW Lvm with [facility] patients insurance. CSW awaiting call back to start insurance authorization for SNF and PTAR. CSW will continue to follow.  Update-1:16pm- CSW received call from [place] with [facility]. CSW started insurance authorization for patient for SNF and PTAR. Insurance authorization for SNF and PTAR currently pending.  Expected Discharge Plan: Skilled Nursing Facility Barriers to Discharge: Continued Medical Work up  Expected Discharge Plan and Services In-house Referral: Clinical Social Work   Post Acute Care Choice: Skilled Nursing Facility Living arrangements for the past 2 months: Skilled Nursing Facility                                       Social Determinants of Health (SDOH) Interventions SDOH Screenings   Food Insecurity: No Food Insecurity (04/10/2023)  Housing: Low Risk  (04/10/2023)  Transportation Needs: No Transportation Needs (04/10/2023)  Utilities: Not At Risk (04/10/2023)  Financial Resource Strain: High Risk (01/19/2022)  Physical Activity: Insufficiently Active (07/21/2022)   Received from [facility], [facility]  Social Connections: Socially Integrated (07/21/2022)   Received from [facility], [facility]  Stress: No Stress Concern Present (07/21/2022)   Received from [facility], [facility]  Tobacco Use: Low Risk  (04/10/2023)  Health Literacy: Low Risk  (07/21/2022)   Received from [facility], [facility]    Readmission Risk Interventions     No data to display

## 2023-04-12 NOTE — Progress Notes (Addendum)
PHARMACY - ANTICOAGULATION CONSULT NOTE  Pharmacy Consult for IV heparin Indication: chest pain/ACS  Allergies  Allergen Reactions   Iodinated Contrast Media Hives and Itching   Penicillins Other (See Comments)    Unknown reaction in childhood    Patient Measurements: Height: 5\' 11"  (180.3 cm) Weight: 97.4 kg (214 lb 11.2 oz) IBW/kg (Calculated) : 75.3 Heparin Dosing Weight: 95.1 kg  Vital Signs: Temp: 99.8 F (37.7 C) (11/11 0334) Temp Source: Oral (11/11 0334) BP: 121/79 (11/11 0334) Pulse Rate: 93 (11/11 0334)  Labs: Recent Labs    04/09/23 1747 04/10/23 0702 04/10/23 0743 04/11/23 0423 04/11/23 1106 04/11/23 2042 04/12/23 0518  HGB 12.9*  --   --  11.1*  --   --  11.1*  HCT 40.5  --   --  34.0*  --   --  34.3*  PLT 141*  --   --  121*  --   --  130*  APTT 58*  --   --   --   --   --   --   HEPARINUNFRC 0.30  --    < > 0.28* 0.23* 0.32 0.34  CREATININE 1.03 0.97  --   --   --   --  0.73   < > = values in this interval not displayed.    Estimated Creatinine Clearance: 99.3 mL/min (by C-G formula based on SCr of 0.73 mg/dL).   Medical History: Past Medical History:  Diagnosis Date   Allergic reaction to contrast dye    Bipolar affective disorder (HCC)    Coronary atherosclerosis of native coronary artery    BMS to RCA 2008, [facility], Dr. Katrinka Blazing   Diabetes mellitus without complication [facility])    GERD (gastroesophageal reflux disease)    Hard of hearing    Right ear   Hyperlipidemia    Hypertension    Myocardial infarction [facility])    IMI 2008   Osteoarthritis    Ringing in right ear     Medications:  Infusions:   heparin 1,950 Units/hr (04/12/23 0704)    Assessment: 72 yo male with NSTEMI transferred to [facility] with IV heparin running at 1130 units/hr. No AC PTA.  Heparin level is therapeutic at 0.34, CBC stable, possible cath today.   Goal of Therapy:  Heparin level 0.3-0.7 units/ml Monitor platelets by anticoagulation protocol: Yes    Plan:  Continue heparin 1950 units/h Daily heparin level and CBC  Fredonia Highland, PharmD, [place], [facility] Clinical Pharmacist [phone] Please check AMION for all [facility] Pharm[facility] numbers 04/12/2023

## 2023-04-12 NOTE — Progress Notes (Signed)
[facility] PROGRESS NOTE    Progress Note  KOLBI SHETTY  ZOX:[id number] DOB: Oct 23, 1950 DOA: 04/09/2023 PCP: [name], MD     Brief Narrative:   FABIEN RENDA is an 72 y.o. male past medical history significant for essential hypertension diabetes mellitus type 2 CAD status post PCI, BPH comes in with shortness of breath that started on 04/08/2023, he has been having nonbilious vomiting for about a week prior to admission at [facility] twelve-lead EKG shows sinus tachycardia with diffuse ST segment depression troponins doubled with every repeat last 1 was greater than 3000 chest x-ray showed vascular congestion with right pleural effusion, white count of 17 was transferred to [facility]  Assessment/Plan:   NSTEMI (non-ST elevated myocardial infarction) (HCC) He was started on IV heparin, aspirin, statin, Imdur and metoprolol. 2D echo was done that showed an EF of 40% with regional wall motion abnormality, small inferoapical area which appeared to be frankly dyskinetic. Cardiology recommended to continue to treat medically for left heart cath on 04/12/2023. Currently NPO.  Leukocytosis/watery bowel movement: Has remained afebrile No further diarrhea, Tmax was 100.2 respiratory panel was negative.  Essential hypertension: Continue metoprolol and Imdur.  Iron deficiency anemia: Will need follow-up with PCP as an outpatient. Patient not remember when was his last colonoscopy.  Hyperlipidemia Continue statins.  Type 2 diabetes mellitus without complication, without long-term current use of insulin (HCC): Continue long-acting insulin plus sliding scale. Blood glucose well-controlled.  Obesity: Noted has been counseled.  Anxiety/depression: Continue trazodone and fluoxetine  Benign prostatic hyperplasia Cont home meds   DVT prophylaxis: heparin Family Communication:none Status is: Inpatient Remains inpatient appropriate because: NSTEMI    Code Status:      Code Status Orders  (From admission, onward)           Start     Ordered   04/09/23 1631  Full code  Continuous       Question:  By:  Answer:  Consent: discussion documented in EHR   04/09/23 1632           Code Status History     Date Active Date Inactive Code Status Order ID Comments User Context   11/19/2022 1812 11/25/2022 0002 Full Code [id number]  Emeline General, MD ED         IV Access:   Peripheral IV   Procedures and diagnostic studies:   DG Ankle 2 Views Left  Result Date: 04/11/2023 CLINICAL DATA:  Left ankle pain. EXAM: LEFT ANKLE - 2 VIEW COMPARISON:  None Available. FINDINGS: There is no evidence of acute fracture or dislocation. Degenerative changes are noted at the ankle and midfoot. Calcaneal spurring is noted. There are enthesopathic changes at the insertion site of the Achilles tendon. Vascular calcifications are noted in the soft tissues. IMPRESSION: 1. No acute fracture or dislocation. 2. Scattered degenerative changes. Electronically Signed   By: Thornell Sartorius M.D.   On: 04/11/2023 02:21   ECHOCARDIOGRAM COMPLETE  Result Date: 04/10/2023    ECHOCARDIOGRAM REPORT   Patient Name:   ECKER REILLEY Rahrig Date of Exam: 04/10/2023 Medical Rec #:  [id number]      Height:       71.0 in Accession #:    [id number]     Weight:       214.7 lb Date of Birth:  [birth year]       BSA:          2.173 m Patient Age:    72 years  BP:           129/71 mmHg Patient Gender: M              HR:           96 bpm. Exam Location:  Inpatient Procedure: 2D Echo, Color Doppler, Cardiac Doppler and Intracardiac            Opacification Agent Indications:    NSTEMI  History:        Patient has prior history of Echocardiogram examinations, most                 recent 11/20/2022. CAD and NSTEMI; Risk Factors:Hypertension,                 Dyslipidemia and Diabetes.  Sonographer:    [facility] Referring Phys: [id number] Cecille Po MELVIN IMPRESSIONS  1. No thrombus is seen (Definity contrast was used). Left  ventricular ejection fraction, by estimation, is 40 to 45%. The left ventricle has mildly decreased function. The left ventricle demonstrates regional wall motion abnormalities (see scoring diagram/findings for description). Left ventricular diastolic parameters are consistent with Grade I diastolic dysfunction (impaired relaxation). A very small area of the inferoapical "cap" appears to be frankly dyskinetic.  2. Right ventricular systolic function is normal. The right ventricular size is normal. Tricuspid regurgitation signal is inadequate for assessing PA pressure.  3. Left atrial size was moderately dilated.  4. The mitral valve is normal in structure. Mild to moderate mitral valve regurgitation.  5. The aortic valve is tricuspid. There is mild calcification of the aortic valve. There is mild thickening of the aortic valve. Aortic valve regurgitation is not visualized. Aortic valve sclerosis/calcification is present, without any evidence of aortic stenosis.  6. The inferior vena cava is dilated in size with <50% respiratory variability, suggesting right atrial pressure of 15 mmHg. Comparison(s): Prior images reviewed side by side. The left ventricular function is worsened. The left ventricular wall motion is new. FINDINGS  Left Ventricle: No thrombus is seen (Definity contrast was used). Left ventricular ejection fraction, by estimation, is 40 to 45%. The left ventricle has mildly decreased function. The left ventricle demonstrates regional wall motion abnormalities. Definity contrast agent was given IV to delineate the left ventricular endocardial borders. The left ventricular internal cavity size was normal in size. There is no left ventricular hypertrophy. Left ventricular diastolic parameters are consistent with Grade I diastolic dysfunction (impaired relaxation). Indeterminate filling pressures.  LV Wall Scoring: The apex is dyskinetic. The entire inferior wall and posterior wall are hypokinetic. A very  small area of the inferoapical "cap" appears to be frankly dyskinetic. Right Ventricle: The right ventricular size is normal. Right vetricular wall thickness was not well visualized. Right ventricular systolic function is normal. Tricuspid regurgitation signal is inadequate for assessing PA pressure. Left Atrium: Left atrial size was moderately dilated. Right Atrium: Right atrial size was normal in size. Pericardium: There is no evidence of pericardial effusion. Mitral Valve: The mitral valve is normal in structure. Mild to moderate mitral valve regurgitation, with centrally-directed jet. Tricuspid Valve: The tricuspid valve is normal in structure. Tricuspid valve regurgitation is not demonstrated. Aortic Valve: The aortic valve is tricuspid. There is mild calcification of the aortic valve. There is mild thickening of the aortic valve. Aortic valve regurgitation is not visualized. Aortic valve sclerosis/calcification is present, without any evidence of aortic stenosis. Aortic valve mean gradient measures 6.0 mmHg. Aortic valve peak gradient measures 10.6 mmHg. Aortic valve area, by VTI  measures 1.89 cm. Pulmonic Valve: The pulmonic valve was normal in structure. Pulmonic valve regurgitation is not visualized. Aorta: The aortic root is normal in size and structure. Venous: The inferior vena cava is dilated in size with less than 50% respiratory variability, suggesting right atrial pressure of 15 mmHg. IAS/Shunts: No atrial level shunt detected by color flow Doppler.  LEFT VENTRICLE PLAX 2D LVIDd:         5.50 cm   Diastology LVIDs:         4.30 cm   LV e' medial:    7.07 cm/s LV PW:         1.10 cm   LV E/e' medial:  13.0 LV IVS:        1.00 cm   LV e' lateral:   9.79 cm/s LVOT diam:     2.20 cm   LV E/e' lateral: 9.4 LV SV:         52 LV SV Index:   24 LVOT Area:     3.80 cm  RIGHT VENTRICLE RV Basal diam:  3.10 cm RV Mid diam:    1.70 cm RV S prime:     12.90 cm/s TAPSE (M-mode): 2.1 cm LEFT ATRIUM              Index        RIGHT ATRIUM           Index LA diam:        4.70 cm 2.16 cm/m   RA Area:     14.50 cm LA Vol (A2C):   68.2 ml 31.38 ml/m  RA Volume:   31.10 ml  14.31 ml/m LA Vol (A4C):   44.0 ml 20.25 ml/m LA Biplane Vol: 54.3 ml 24.99 ml/m  AORTIC VALVE AV Area (Vmax):    1.91 cm AV Area (Vmean):   1.82 cm AV Area (VTI):     1.89 cm AV Vmax:           163.00 cm/s AV Vmean:          113.000 cm/s AV VTI:            0.275 m AV Peak Grad:      10.6 mmHg AV Mean Grad:      6.0 mmHg LVOT Vmax:         81.80 cm/s LVOT Vmean:        54.000 cm/s LVOT VTI:          0.137 m LVOT/AV VTI ratio: 0.50  AORTA Ao Root diam: 3.60 cm MITRAL VALVE MV Area (PHT): 4.29 cm     SHUNTS MV Decel Time: 177 msec     Systemic VTI:  0.14 m MV E velocity: 92.20 cm/s   Systemic Diam: 2.20 cm MV A velocity: 113.00 cm/s MV E/A ratio:  0.82 Mihai Croitoru MD Electronically signed by Thurmon Fair MD Signature Date/Time: 04/10/2023/6:09:56 PM    Final      Medical Consultants:   None.   Subjective:    SERENITY RAPA denies any chest pain or shortness of breath has had no further diarrhea.  Objective:    Vitals:   04/11/23 1157 04/11/23 1642 04/11/23 2004 04/12/23 0334  BP: 113/71 107/75 114/70 121/79  Pulse: 89 (!) 53 91 93  Resp: 20 19 16 18   Temp: 99.3 F (37.4 C) 100.2 F (37.9 C) 100 F (37.8 C) 99.8 F (37.7 C)  TempSrc: Oral Oral Oral Oral  SpO2: 96% 98% 98%  Weight:      Height:       SpO2: 98 % O2 Flow Rate (L/min): 2 L/min   Intake/Output Summary (Last 24 hours) at 04/12/2023 0911 Last data filed at 04/12/2023 0800 Gross per 24 hour  Intake 897.8 ml  Output 2010 ml  Net -1112.2 ml   Filed Weights   04/10/23 0533  Weight: 97.4 kg    Exam: General exam: In no acute distress. Respiratory system: Good air movement and clear to auscultation. Cardiovascular system: S1 & S2 heard, RRR. No JVD. Gastrointestinal system: Abdomen is nondistended, soft and nontender.  Extremities: No pedal  edema. Skin: No rashes, lesions or ulcers Psychiatry: Judgement and insight appear normal. Mood & affect appropriate.  Data Reviewed:    Labs: Basic Metabolic Panel: Recent Labs  Lab 04/09/23 1747 04/10/23 0702 04/12/23 0518  NA 141 136 135  K 3.5 3.4* 3.1*  CL 102 103 102  CO2 27 23 24   GLUCOSE 111* 128* 153*  BUN 24* 26* 16  CREATININE 1.03 0.97 0.73  CALCIUM 8.6* 8.2* 8.3*  MG 2.1  --   --    GFR Estimated Creatinine Clearance: 99.3 mL/min (by C-G formula based on SCr of 0.73 mg/dL). Liver Function Tests: No results for input(s): "AST", "ALT", "ALKPHOS", "BILITOT", "PROT", "ALBUMIN" in the last 168 hours. No results for input(s): "LIPASE", "AMYLASE" in the last 168 hours. No results for input(s): "AMMONIA" in the last 168 hours. Coagulation profile No results for input(s): "INR", "PROTIME" in the last 168 hours. COVID-19 Labs  No results for input(s): "DDIMER", "FERRITIN", "LDH", "CRP" in the last 72 hours.  No results found for: "SARSCOV2NAA"  CBC: Recent Labs  Lab 04/09/23 1747 04/11/23 0423 04/12/23 0518  WBC 9.9 10.2 11.2*  NEUTROABS 8.4*  --   --   HGB 12.9* 11.1* 11.1*  HCT 40.5 34.0* 34.3*  MCV 78.5* 75.9* 75.6*  PLT 141* 121* 130*   Cardiac Enzymes: No results for input(s): "CKTOTAL", "CKMB", "CKMBINDEX", "TROPONINI" in the last 168 hours. BNP (last 3 results) No results for input(s): "PROBNP" in the last 8760 hours. CBG: Recent Labs  Lab 04/11/23 0752 04/11/23 1154 04/11/23 1640 04/11/23 2104 04/12/23 0736  GLUCAP 123* 137* 140* 169* 139*   D-Dimer: No results for input(s): "DDIMER" in the last 72 hours. Hgb A1c: No results for input(s): "HGBA1C" in the last 72 hours. Lipid Profile: Recent Labs    04/10/23 0702  CHOL 111  HDL 41  LDLCALC 51  TRIG 94  CHOLHDL 2.7   Thyroid function studies: No results for input(s): "TSH", "T4TOTAL", "T3FREE", "THYROIDAB" in the last 72 hours.  Invalid input(s): "FREET3" Anemia work up: No  results for input(s): "VITAMINB12", "FOLATE", "FERRITIN", "TIBC", "IRON", "RETICCTPCT" in the last 72 hours. Sepsis Labs: Recent Labs  Lab 04/09/23 1747 04/11/23 0423 04/12/23 0518  WBC 9.9 10.2 11.2*   Microbiology Recent Results (from the past 240 hour(s))  Respiratory (~20 pathogens) panel by PCR     Status: None   Collection Time: 04/09/23  5:43 PM   Specimen: Nasopharyngeal Swab; Respiratory  Result Value Ref Range Status   Adenovirus NOT DETECTED NOT DETECTED Final   Coronavirus 229E NOT DETECTED NOT DETECTED Final    Comment: (NOTE) The Coronavirus on the Respiratory Panel, DOES NOT test for the novel  Coronavirus (2019 nCoV)    Coronavirus HKU1 NOT DETECTED NOT DETECTED Final   Coronavirus NL63 NOT DETECTED NOT DETECTED Final   Coronavirus OC43 NOT DETECTED NOT DETECTED Final  Metapneumovirus NOT DETECTED NOT DETECTED Final   Rhinovirus / Enterovirus NOT DETECTED NOT DETECTED Final   Influenza A NOT DETECTED NOT DETECTED Final   Influenza B NOT DETECTED NOT DETECTED Final   Parainfluenza Virus 1 NOT DETECTED NOT DETECTED Final   Parainfluenza Virus 2 NOT DETECTED NOT DETECTED Final   Parainfluenza Virus 3 NOT DETECTED NOT DETECTED Final   Parainfluenza Virus 4 NOT DETECTED NOT DETECTED Final   Respiratory Syncytial Virus NOT DETECTED NOT DETECTED Final   Bordetella pertussis NOT DETECTED NOT DETECTED Final   Bordetella Parapertussis NOT DETECTED NOT DETECTED Final   Chlamydophila pneumoniae NOT DETECTED NOT DETECTED Final   Mycoplasma pneumoniae NOT DETECTED NOT DETECTED Final    Comment: Performed at [facility], [address], [place], [place] [address]     Medications:    aspirin EC  81 mg Oral Daily   atorvastatin  40 mg Oral Daily   diclofenac Sodium  2 g Topical QID   feeding supplement (GLUCERNA SHAKE)  237 mL Oral TID BM   fluvoxaMINE  200 mg Oral Daily   furosemide  40 mg Oral Daily   insulin aspart  0-15 Units Subcutaneous TID WC   insulin  aspart  0-5 Units Subcutaneous QHS   insulin aspart  4 Units Subcutaneous TID WC   insulin glargine-yfgn  5 Units Subcutaneous QHS   isosorbide mononitrate  30 mg Oral Daily   melatonin  10 mg Oral QHS   metoprolol succinate  100 mg Oral Daily   pantoprazole  40 mg Oral Daily   potassium chloride  40 mEq Oral BID   QUEtiapine  25 mg Oral QHS   sodium chloride flush  10 mL Intravenous Q12H   traZODone  150 mg Oral QHS   Continuous Infusions:  heparin 1,950 Units/hr (04/12/23 0704)      LOS: 3 days   Marinda Elk  [facility]  04/12/2023, 9:11 AM

## 2023-04-12 NOTE — Progress Notes (Signed)
Mobility Specialist Progress Note:   04/12/23 1200  Mobility  Activity Dangled on edge of bed;Stood at bedside  Level of Assistance Moderate assist, patient does 50-74%  Assistive Device Stedy  Activity Response Tolerated well  Mobility Referral Yes  $Mobility charge 1 Mobility  Mobility Specialist Start Time (ACUTE ONLY) 1138  Mobility Specialist Stop Time (ACUTE ONLY) 1158  Mobility Specialist Time Calculation (min) (ACUTE ONLY) 20 min    Pt received in bed, agreeable to mobility. Performed supine leg lifts, and seated marches + knee extentions on EOB. C/o his bottom hurting from the bed. Pt's bedding found to be wet and soiled. Stood pt in stedy w/ modA while NT changed bedding. Anxious but asymptomatic throughout. Pt left in bed with call bell and NT present.  D'Vante Earlene Plater Mobility Specialist Please contact via Special educational needs teacher or Rehab office at [phone]

## 2023-04-12 NOTE — Interval H&P Note (Signed)
History and Physical Interval Note:  04/12/2023 5:36 PM  Michael Rose  has presented today for surgery, with the diagnosis of CM, NSTEMI.  The various methods of treatment have been discussed with the patient and family. After consideration of risks, benefits and other options for treatment, the patient has consented to  Procedure(s): LEFT HEART CATH AND CORONARY ANGIOGRAPHY (N/A) as a surgical intervention.  The patient's history has been reviewed, patient examined, no change in status, stable for surgery.  I have reviewed the patient's chart and labs.  Questions were answered to the patient's satisfaction.     [name]

## 2023-04-12 NOTE — Progress Notes (Signed)
Pt found without TR band on when received him at handoff. Patient had just come up from cath lab and had no air removed from band yet. Patient removed band himself. Called cath lab and nurse came up to check patient. Recommended I put Tegaderm and gauze on site and keep it immobilized. Cleaned patient up and put Tegaderm dressing on his left radial site. Limited bleeding and no hematoma present. Will continue to monitor.

## 2023-04-13 ENCOUNTER — Inpatient Hospital Stay (HOSPITAL_COMMUNITY): Payer: PPO

## 2023-04-13 ENCOUNTER — Encounter (HOSPITAL_COMMUNITY): Payer: Self-pay | Admitting: Cardiovascular Disease

## 2023-04-13 DIAGNOSIS — I5021 Acute systolic (congestive) heart failure: Secondary | ICD-10-CM | POA: Diagnosis not present

## 2023-04-13 DIAGNOSIS — I340 Nonrheumatic mitral (valve) insufficiency: Secondary | ICD-10-CM | POA: Diagnosis not present

## 2023-04-13 DIAGNOSIS — I214 Non-ST elevation (NSTEMI) myocardial infarction: Secondary | ICD-10-CM | POA: Diagnosis not present

## 2023-04-13 DIAGNOSIS — I2510 Atherosclerotic heart disease of native coronary artery without angina pectoris: Secondary | ICD-10-CM | POA: Diagnosis not present

## 2023-04-13 DIAGNOSIS — G464 Cerebellar stroke syndrome: Secondary | ICD-10-CM | POA: Diagnosis not present

## 2023-04-13 DIAGNOSIS — I639 Cerebral infarction, unspecified: Secondary | ICD-10-CM | POA: Diagnosis not present

## 2023-04-13 LAB — CBC
HCT: 35.5 % — ABNORMAL LOW (ref 39.0–52.0)
Hemoglobin: 11.7 g/dL — ABNORMAL LOW (ref 13.0–17.0)
MCH: 24.6 pg — ABNORMAL LOW (ref 26.0–34.0)
MCHC: 33 g/dL (ref 30.0–36.0)
MCV: 74.6 fL — ABNORMAL LOW (ref 80.0–100.0)
Platelets: 162 10*3/uL (ref 150–400)
RBC: 4.76 MIL/uL (ref 4.22–5.81)
RDW: 16.3 % — ABNORMAL HIGH (ref 11.5–15.5)
WBC: 12.2 10*3/uL — ABNORMAL HIGH (ref 4.0–10.5)
nRBC: 0 % (ref 0.0–0.2)

## 2023-04-13 LAB — GLUCOSE, CAPILLARY
Glucose-Capillary: 232 mg/dL — ABNORMAL HIGH (ref 70–99)
Glucose-Capillary: 189 mg/dL — ABNORMAL HIGH (ref 70–99)
Glucose-Capillary: 152 mg/dL — ABNORMAL HIGH (ref 70–99)
Glucose-Capillary: 189 mg/dL — ABNORMAL HIGH (ref 70–99)

## 2023-04-13 LAB — HEPARIN LEVEL (UNFRACTIONATED): Heparin Unfractionated: 0.33 [IU]/mL (ref 0.30–0.70)

## 2023-04-13 MED ORDER — ATORVASTATIN CALCIUM 80 MG PO TABS
80.0000 mg | ORAL_TABLET | Freq: Every day | ORAL | Status: DC
  Administered 2023-04-14 – 2023-04-22 (×9): 80 mg via ORAL
  Filled 2023-04-13 (×9): qty 1

## 2023-04-13 MED ORDER — INSULIN GLARGINE-YFGN 100 UNIT/ML ~~LOC~~ SOLN
10.0000 [IU] | Freq: Two times a day (BID) | SUBCUTANEOUS | Status: DC
  Administered 2023-04-13 – 2023-04-22 (×19): 10 [IU] via SUBCUTANEOUS
  Filled 2023-04-13 (×20): qty 0.1

## 2023-04-13 NOTE — Progress Notes (Signed)
Rounding Note    Patient Name: Michael Rose Date of Encounter: 04/13/2023  [facility] Cardiologist:  Lives in [place], previously followed with Dr. Diona Browner  Subjective   Pt took off TR band about 75 min post procedure, question confusion, patient reported becoming "hot." No hematoma.  No chest pain.   Inpatient Medications    Scheduled Meds:  aspirin EC  81 mg Oral Daily   atorvastatin  40 mg Oral Daily   diclofenac Sodium  2 g Topical QID   feeding supplement (GLUCERNA SHAKE)  237 mL Oral TID BM   fluvoxaMINE  200 mg Oral Daily   furosemide  40 mg Intravenous BID   insulin aspart  0-15 Units Subcutaneous TID WC   insulin aspart  0-5 Units Subcutaneous QHS   insulin aspart  4 Units Subcutaneous TID WC   insulin glargine-yfgn  10 Units Subcutaneous BID   isosorbide mononitrate  30 mg Oral Daily   melatonin  10 mg Oral QHS   metoprolol succinate  100 mg Oral Daily   pantoprazole  40 mg Oral Daily   QUEtiapine  25 mg Oral QHS   sodium chloride flush  3 mL Intravenous Q12H   traZODone  150 mg Oral QHS   Continuous Infusions:  sodium chloride     heparin 1,950 Units/hr (04/13/23 0313)   PRN Meds: sodium chloride, acetaminophen, HYDROcodone-acetaminophen, nitroGLYCERIN, ondansetron (ZOFRAN) IV, polyethylene glycol, sodium chloride flush   Vital Signs    Vitals:   04/12/23 1822 04/12/23 1827 04/12/23 2007 04/13/23 0350  BP: 112/82 119/82 125/89 131/89  Pulse: 100 100 93 83  Resp: (!) 31 (!) 30 16 16   Temp:   97.8 F (36.6 C) 98.1 F (36.7 C)  TempSrc:   Oral Oral  SpO2: 94% 93% 92% 95%  Weight:      Height:        Intake/Output Summary (Last 24 hours) at 04/13/2023 0903 Last data filed at 04/13/2023 0353 Gross per 24 hour  Intake 473.74 ml  Output 1400 ml  Net -926.26 ml      04/10/2023    5:33 AM 11/20/2022   11:20 AM 11/19/2022    4:20 PM  Last 3 Weights  Weight (lbs) 214 lb 11.2 oz 228 lb 13.4 oz 229 lb  Weight (kg) 97.387 kg 103.8 kg  103.874 kg      Telemetry    SR in the 80-90s - Personally Reviewed  ECG    No new tracings - Personally Reviewed  Physical Exam   GEN: No acute distress.   Neck: No JVD Cardiac: RRR, no murmurs, rubs, or gallops.  Respiratory: Clear to auscultation bilaterally. GI: Soft, nontender, non-distended  MS: No edema; No deformity. Neuro:  Nonfocal  Psych: Normal affect  Right radial site C/D/I  Labs    High Sensitivity Troponin:  No results for input(s): "TROPONINIHS" in the last 720 hours.   Chemistry Recent Labs  Lab 04/09/23 1747 04/10/23 0702 04/12/23 0518  NA 141 136 135  K 3.5 3.4* 3.1*  CL 102 103 102  CO2 27 23 24   GLUCOSE 111* 128* 153*  BUN 24* 26* 16  CREATININE 1.03 0.97 0.73  CALCIUM 8.6* 8.2* 8.3*  MG 2.1  --   --   GFRNONAA >60 >60 >60  ANIONGAP 12 10 9     Lipids  Recent Labs  Lab 04/10/23 0702  CHOL 111  TRIG 94  HDL 41  LDLCALC 51  CHOLHDL 2.7  Hematology Recent Labs  Lab 04/11/23 0423 04/12/23 0518 04/13/23 0556  WBC 10.2 11.2* 12.2*  RBC 4.48 4.54 4.76  HGB 11.1* 11.1* 11.7*  HCT 34.0* 34.3* 35.5*  MCV 75.9* 75.6* 74.6*  MCH 24.8* 24.4* 24.6*  MCHC 32.6 32.4 33.0  RDW 16.5* 16.6* 16.3*  PLT 121* 130* 162   Thyroid No results for input(s): "TSH", "FREET4" in the last 168 hours.  BNPNo results for input(s): "BNP", "PROBNP" in the last 168 hours.  DDimer No results for input(s): "DDIMER" in the last 168 hours.   Radiology    CARDIAC CATHETERIZATION  Result Date: 04/12/2023 Severe left main disease with diffuse 80-90% calcified stenosis Severe mid-LAD stenosis of 80% Critical ostial RCA stenosis with a 95-99% ulcerated plaque Nonobstructive LCx stenosis Mild-moderate segmental LV dysfunction with severely elevated LVEDP Recommend: TCTS consult for consideration of CABG. Resume heparin. Diuresis as tolerated.    Cardiac Studies   Left heart cath 04/12/23: Severe left main disease with diffuse 80-90% calcified  stenosis Severe mid-LAD stenosis of 80% Critical ostial RCA stenosis with a 95-99% ulcerated plaque Nonobstructive LCx stenosis Mild-moderate segmental LV dysfunction with severely elevated LVEDP   Recommend: TCTS consult for consideration of CABG. Resume heparin. Diuresis as tolerated.   Patient Profile     72 y.o. male male with a history of CAD with MI in 2008 s/p BMS to RCA, hypertension, hyperlipidemia, type 2 diabetes mellitus, GERD, anxiety/ depression, and bipolar affective disorder who is being seen 04/09/2023 for the evaluation of NSTEMI.  Assessment & Plan    NSTEMI CAD Heart catheterization yesterday with 90% left main disease followed by 50% and 80% lesions in the LAD as well as 99% stenosis in the ostial-proximal RCA - given left main and critical stenosis in the RCA, CT surgery recommended - continue heparin gtt, ASA,imdur, 100 mg toprol - increase lipitor to 80 mg   Acute systolic heart failure - echo with LVEF 40-45% - LVEDP at the time of cath elevated to 34 mmHg - diuresing on 40 mg IV lasix BID with 2.3 L urine output yesterday - he is overall net negative 2.3 L, no recent weight   Hyperlipidemia with LDL goal < 70 04/10/2023: Cholesterol 111; HDL 41; LDL Cholesterol 51; Triglycerides 94; VLDL 19 - increase lipitor to 80 mg - LP(a) 37.9   Hypertension - medications as above - plan to titrate GDMT for CHF following CABG     For questions or updates, please contact [facility] Please consult [email or web address] for contact info under        Signed, Marcelino Duster, PA  04/13/2023, 9:03 AM

## 2023-04-13 NOTE — Progress Notes (Signed)
Patient slurring speech this AM and believes he is experiencing speech changes.  He has exhibited this intermittently since prior to admission, however.  No confusion is present; alert and oriented x 4.  Also continues to have weakness of both lower extremities, worse on left.  No focal weakness of upper extremities.  Discussed with PA-C rounding for TCTS and with Dr. Radonna Ricker at bedside.  MD will check MRI-brain.  Holding PO medications for [facility] swallow screen.  Full neuro assessment pending.  Will continue to monitor closely.

## 2023-04-13 NOTE — Progress Notes (Signed)
PHARMACY - ANTICOAGULATION CONSULT NOTE  Pharmacy Consult for IV heparin Indication: chest pain/ACS  Allergies  Allergen Reactions   Iodinated Contrast Media Hives and Itching   Penicillins Other (See Comments)    Unknown reaction in childhood    Patient Measurements: Height: 5\' 11"  (180.3 cm) Weight: 97.4 kg (214 lb 11.2 oz) IBW/kg (Calculated) : 75.3 Heparin Dosing Weight: 95.1 kg  Vital Signs: Temp: 98.1 F (36.7 C) (11/12 0350) Temp Source: Oral (11/12 0350) BP: 131/89 (11/12 0350) Pulse Rate: 83 (11/12 0350)  Labs: Recent Labs    04/11/23 0423 04/11/23 1106 04/11/23 2042 04/12/23 0518 04/13/23 0556  HGB 11.1*  --   --  11.1* 11.7*  HCT 34.0*  --   --  34.3* 35.5*  PLT 121*  --   --  130* 162  HEPARINUNFRC 0.28*   < > 0.32 0.34 0.33  CREATININE  --   --   --  0.73  --    < > = values in this interval not displayed.    Estimated Creatinine Clearance: 99.3 mL/min (by C-G formula based on SCr of 0.73 mg/dL).   Medical History: Past Medical History:  Diagnosis Date   Allergic reaction to contrast dye    Bipolar affective disorder (HCC)    Coronary atherosclerosis of native coronary artery    BMS to RCA 2008, [facility], Dr. 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 No AC PTA.  Pt s/p LHC with severe LM and LAD disease, heparin resumed, TCTS consulted for CABG. Heparin level therapeutic this am, CBC stable.  Goal of Therapy:  Heparin level 0.3-0.7 units/ml Monitor platelets by anticoagulation protocol: Yes   Plan:  Continue heparin 1950  units/hr Daily heparin level and CBC   Fredonia Highland, PharmD, [place], [facility] Clinical Pharmacist [phone] Please check AMION for all [facility] numbers 04/13/2023

## 2023-04-13 NOTE — Consult Note (Signed)
NEUROLOGY CONSULT NOTE   Date of service: April 13, 2023 Patient Name: Michael Rose MRN:  [id number] DOB:  Feb 02, 1951 Chief Complaint: "stroke symptoms" Requesting Provider: Marinda Elk, MD  History of Present Illness  LEVIS DUCRE is a 72 y.o. male with PMH significant for HTN, HLD, CAD s/p PCI, MI, obesity, gout, OA who presented to [facility] on 11/7 d/t SOB x 1 week. CT showed chronic infarct. Troponin elevated with upward trend. Patient was transferred to [facility] for cardiac cath. Cath completed 11/11, showed severe left main and LAD stenosis, critical RCA stenosis. ECHO with EF 40-45% with moderately dilated left atrium. Patient stated that he uses either wheelchair or rolling walker at home.   This AM, patient was noted to have slurred speech, but noted that he had been having this intermittently since PTA. Around 10:25, patient began having LUE weakness, ataxia, left facial droop, left tongue deviation. MRI showed multiple right posterior circulation acute infarcts, the most prominent in the right paramedian pons. No previous notation of atrial fibrillation. Neurology consulted.   Patient currently on aspirin 81mg , Heparin IV and Lipitor 80mg .  On exam, patient has moderate dysarthria, left facial droop, left tongue deviation, LUE weakness and decreased sensation to left face, arm and leg.   NIHSS:  1a Level of Conscious.: 0 1b LOC Questions: 0 1c LOC Commands: 0 2 Best Gaze: 0 3 Visual: 0 4 Facial Palsy: 1 5a Motor Arm - left: 1 5b Motor Arm - Right:0  6a Motor Leg - Left: 0 6b Motor Leg - Right: 0 7 Limb Ataxia: 0 8 Sensory: 1 9 Best Language: 0 10 Dysarthria: 2 11 Extinct. and Inatten.: 0 TOTAL: 5    ROS  Comprehensive ROS performed and pertinent positives documented in HPI   Past History   Past Medical History:  Diagnosis Date   Allergic reaction to contrast dye    Bipolar affective disorder (HCC)    Coronary atherosclerosis of native  coronary artery    BMS to RCA 2008, [facility], Dr. 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     Past Surgical History:  Procedure Laterality Date   CHOLECYSTECTOMY  2013   JOINT REPLACEMENT     LEFT HEART CATH AND CORONARY ANGIOGRAPHY N/A 04/12/2023   Procedure: LEFT HEART CATH AND CORONARY ANGIOGRAPHY;  Surgeon: [name], MD;  Location[facility] INVASIVE CV LAB;  Service: Cardiovascular;  Laterality: N/A;   ORIF FEMUR FRACTURE Right 11/20/2022   Procedure: OPEN REDUCTION INTERNAL FIXATION (ORIF) DISTAL FEMUR FRACTURE;  Surgeon: Roby Lofts, MD;  Location: [facility] OR;  Service: Orthopedics;  Laterality: Right;   TOTAL KNEE ARTHROPLASTY     Left   TOTAL KNEE ARTHROPLASTY Right 10/21/2012   Dr Madelon Lips   TOTAL KNEE ARTHROPLASTY Right 10/21/2012   Procedure: TOTAL KNEE ARTHROPLASTY;  Surgeon: Thera Flake., MD;  Location: [facility] OR;  Service: Orthopedics;  Laterality: Right;   VASECTOMY      Family History: Family History  Problem Relation Age of Onset   Heart disease Mother    Heart disease Father     Social History  reports that he has never smoked. He has never used smokeless tobacco. He reports that  he does not drink alcohol and does not use drugs.  Allergies  Allergen Reactions   Iodinated Contrast Media Hives and Itching   Penicillins Other (See Comments)    Unknown reaction in childhood    Medications   Current Facility-Administered Medications:    0.9 %  sodium chloride infusion, 250 mL, Intravenous, PRN, [name], MD   acetaminophen (TYLENOL) tablet 650 mg, 650 mg, Oral, Q4H PRN, [name], MD, 650 mg at 04/10/23 1625   aspirin EC tablet 81 mg, 81 mg, Oral, Daily, [name], MD, 81 mg at 04/12/23 0857   [START ON 04/14/2023] atorvastatin  (LIPITOR) tablet 80 mg, 80 mg, Oral, Daily, [facility], Roe Rutherford, PA   diclofenac Sodium (VOLTAREN) 1 % topical gel 2 g, 2 g, Topical, QID, [name], MD, 2 g at 04/13/23 1009   feeding supplement (GLUCERNA SHAKE) (GLUCERNA SHAKE) liquid 237 mL, 237 mL, Oral, TID BM, [name], MD, 237 mL at 04/12/23 2000   fluvoxaMINE (LUVOX) tablet 200 mg, 200 mg, Oral, Daily, [name], MD, 200 mg at 04/12/23 0857   furosemide (LASIX) injection 40 mg, 40 mg, Intravenous, BID, [name], MD, 40 mg at 04/12/23 1904   heparin ADULT infusion 100 units/mL (25000 units/244mL), 1,950 Units/hr, Intravenous, Continuous, Kendal Hymen, RPH, Last Rate: 19.5 mL/hr at 04/13/23 1149, 1,950 Units/hr at 04/13/23 1149   HYDROcodone-acetaminophen (NORCO) 7.5-325 MG per tablet 1 tablet, 1 tablet, Oral, Q6H PRN, [name], MD, 1 tablet at 04/12/23 2225   insulin aspart (novoLOG) injection 0-15 Units, 0-15 Units, Subcutaneous, TID WC, [name], MD, 5 Units at 04/13/23 0934   insulin aspart (novoLOG) injection 0-5 Units, 0-5 Units, Subcutaneous, QHS, [name], MD   insulin aspart (novoLOG) injection 4 Units, 4 Units, Subcutaneous, TID WC, [name], MD, 4 Units at 04/13/23 0933   insulin glargine-yfgn [facility]) injection 10 Units, 10 Units, Subcutaneous, BID, Marinda Elk, MD, 10 Units at 04/13/23 0935   isosorbide mononitrate (IMDUR) 24 hr tablet 30 mg, 30 mg, Oral, Daily, [name], MD, 30 mg at 04/12/23 0857   melatonin tablet 10 mg, 10 mg, Oral, QHS, [name], MD, 10 mg at 04/12/23 2224   metoprolol succinate (TOPROL-XL) 24 hr tablet 100 mg, 100 mg, Oral, Daily, [name], MD, 100 mg at 04/12/23 0857   nitroGLYCERIN (NITROSTAT) SL tablet 0.4 mg, 0.4 mg, Sublingual, Q5 Min x 3 PRN, [name], MD   ondansetron [facility]) injection 4 mg, 4 mg, Intravenous, Q6H PRN, [name], MD   pantoprazole (PROTONIX) EC tablet 40 mg, 40 mg, Oral, Daily,  [name], MD, 40 mg at 04/12/23 0857   polyethylene glycol (MIRALAX / GLYCOLAX) packet 17 g, 17 g, Oral, Daily PRN, [name], MD   QUEtiapine (SEROQUEL) tablet 25 mg, 25 mg, Oral, QHS, [name], MD, 25 mg at 04/12/23 2225   sodium chloride flush (NS) 0.9 % injection 3 mL, 3 mL, Intravenous, Q12H, [name], MD, 3 mL at 04/12/23 2200   sodium chloride flush (NS) 0.9 % injection 3 mL, 3 mL, Intravenous, PRN, [name], MD   traZODone (DESYREL) tablet 150 mg, 150 mg, Oral, Mickel Crow, MD, 150 mg at 04/12/23 2224  Vitals   Vitals:   04/12/23 1827 04/12/23 2007 04/13/23 0350 04/13/23 1158  BP: 119/82 125/89 131/89   Pulse: 100 93 83   Resp: (!) 30 16 16 18   Temp:  97.8 F (36.6 C) 98.1 F (36.7 C) 97.8 F (36.6 C)  TempSrc:  Oral Oral Oral  SpO2: 93% 92% 95%   Weight:      Height:        Body mass index is 29.94 kg/m.  Physical Exam   Constitutional: Appears well-developed and well-nourished.  Psych: Affect appropriate to situation.  HENT: No OP obstruction.  Head: Normocephalic.  Cardiovascular: SR with PACs on monitor. Respiratory: Effort normal, non-labored breathing.  GI: Soft.  No distension. There is no tenderness.  Skin: WDI.   Neurologic Examination   Neuro: Mental Status: Patient is drowsy, alert, oriented to person, place, month, year, and situation. Patient is able to give a clear and coherent history. No signs of aphasia or neglect. Moderate dysarthria present.  Cranial Nerves: II: Visual Fields are full. Pupils are equal, round, and reactive to light.   III,IV, VI: EOMI without ptosis or diploplia.  V: Facial sensation is symmetric to temperature VII: Left facial droop VIII: hearing is intact to voice X: Uvula elevates symmetrically XI: Shoulder shrug is symmetric. XII: tongue is deviated to the left.  Motor: Tone is normal. Bulk is normal.  LUE: 4/5 tricep, 4+/5 bicep, 4+/5 grip, slight drift.  LLE: 4/5 hip  and knee, 4+/5 dorsal and plantar flexion RUE: 5/5 throughout, no drift RLE 4+/5 throughout (s/p femur fx and surgery June 2024)  Sensory: Sensation decreased to left face, arm and leg.  Cerebellar: FNF mildly impaired with LUE, but not out of proportion to weakness.    Labs/Imaging/Neurodiagnostic studies   CBC:  Recent Labs  Lab 04-16-23 1747 04/11/23 0423 04/12/23 0518 04/13/23 0556  WBC 9.9   < > 11.2* 12.2*  NEUTROABS 8.4*  --   --   --   HGB 12.9*   < > 11.1* 11.7*  HCT 40.5   < > 34.3* 35.5*  MCV 78.5*   < > 75.6* 74.6*  PLT 141*   < > 130* 162   < > = values in this interval not displayed.    Basic Metabolic Panel:  Lab Results  Component Value Date   NA 135 04/12/2023   K 3.1 (L) 04/12/2023   CO2 24 04/12/2023   GLUCOSE 153 (H) 04/12/2023   BUN 16 04/12/2023   CREATININE 0.73 04/12/2023   CALCIUM 8.3 (L) 04/12/2023   GFRNONAA >60 04/12/2023   GFRAA 80 (L) 10/22/2012    Lipid Panel:  Lab Results  Component Value Date   LDLCALC 51 04/10/2023    HgbA1c:  Lab Results  Component Value Date   HGBA1C 6.8 (H) 11/19/2022    Urine Drug Screen: No results found for: "LABOPIA", "COCAINSCRNUR", "LABBENZ", "AMPHETMU", "THCU", "LABBARB"   Alcohol Level No results found for: "ETH"  INR  Lab Results  Component Value Date   INR 1.03 10/13/2012    APTT  Lab Results  Component Value Date   APTT 58 (H) Apr 16, 2023    MRI Brain(Personally reviewed): Right pontine acute infarct, Right cerebellar acute infarct, Right posterior temporal acute infarct, Right posterior occipital acute infarct.  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    Impression  72 y.o. male with PMH significant for HTN, HLD, CAD s/p PCI, obesity, gout, OA who presented to [facility] on 11/7 d/t SOB x 1 week. CT showed chronic infarct. Troponin elevated with  upward trend. Patient was transferred to [facility] for cardiac cath, completed 11/11. This am, patient began having LUE weakness, ataxia, left facial droop,  left tongue deviation. MRI showed multiple right acute infarcts. Patient currently on aspirin 81mg , Heparin IV and Lipitor 80mg .  - Exam reveals moderate dysarthria, left facial droop, left sided weakness and left sided sensory numbness. - MRI brain: Acute infarction affecting the right paramedian pons. Small focus of acute infarction within the right cerebellar hemisphere. Two small foci of acute infarction affecting the right posterior temporal lobe and right occipital lobe. Findings all consistent with posterior circulation insult. Extensive chronic small-vessel ischemic changes otherwise throughout the cerebral hemispheric white matter. Old right basal ganglia infarction with hemosiderin deposition. 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 - DDx for stroke etiology includes cardioembolic and atherothrombotic mechanisms. A periprocedural stroke due to perturbation of atherosclerotic aortic plaque is also possible given cardiac catheterization yesterday.   Recommendations  - Frequent Neuro checks per stroke unit protocol - MRI Brain stroke protocol - Vascular imaging - MRA head without (ordered), Carotid US (ordered) - TTE recently done 11/9, no need to repeat.  - Lipid panel - Statin - will be started if LDL>70 or otherwise medically indicated - A1C - Antithrombotic - Currently on Heparin IV as well as ASA per Cardiology protocol for his NSTEMI. Stroke Team to clarify best long term regimen for stroke prevention that is compatible with prevention of MI in the context of possible upcoming Cardiothoracic surgery for his left main and critical stenosis in the RCA.  - DVT ppx - Heparin IV - Smoking cessation - will counsel patient - SBP goal - Given his cardiac  comorbidity, would select modified permissive HTN parameters, treating SBP if > 180. Continue permissive hypertension for next 24-48 hours.  - Telemetry monitoring for arrhythmia - 72h - Swallow screen - will be performed prior to PO intake - Stroke education - will be given - PT/OT/SLP  ______________________________________________________________________   Pt seen by Neuro NP/APP and later by MD. Note/plan to be edited by MD as needed.    Lynnae January, DNP, AGACNP-BC [facility] Please use AMION for contact information & EPIC for messaging.  I have seen and examined the patient. I have formulated the assessment and recommendations. 72 year old male with acute strokes seen on MRI, possibly periprocedural in etiology given cardiac catheterization yesterday. Exam reveals moderate dysarthria, left facial droop, left sided weakness and left sided sensory numbness. Recommendations as above.  Electronically signed: Dr. Caryl Pina

## 2023-04-13 NOTE — Progress Notes (Signed)
[facility] PROGRESS NOTE    Progress Note  Michael Rose  GMW:[id number] DOB: 04/28/1951 DOA: 04/09/2023 PCP: [name], MD     Brief Narrative:   Michael Rose is an 72 y.o. male past medical history significant for essential hypertension diabetes mellitus type 2 CAD status post PCI, BPH comes in with shortness of breath that started on 04/08/2023, he has been having nonbilious vomiting for about a week prior to admission at [facility] twelve-lead EKG shows sinus tachycardia with diffuse ST segment depression troponins doubled with every repeat last 1 was greater than 3000 chest x-ray showed vascular congestion with right pleural effusion, white count of 17 was transferred to [facility]. 2D echo was done that showed an EF of 40% with regional wall motion abnormality, small inferoapical area which appeared to be frankly dyskinetic.  Assessment/Plan:   NSTEMI (non-ST elevated myocardial infarction) (HCC) He was started on IV heparin, aspirin, statin, Imdur and metoprolol. Cardiology consulted left heart cath on 04/12/2023, showed severe left main disease 80 to 90%. CT surgery was consulted awaiting further recommendations.  Leukocytosis/watery bowel movement: Has remained afebrile No further diarrhea, Tmax was 100.2 respiratory panel was negative. Leukocytosis resolved.  Likely due to steroids  Essential hypertension: Continue metoprolol and Imdur.  Iron deficiency anemia: Will need follow-up with PCP as an outpatient. Patient not remember when was his last colonoscopy.  Hyperlipidemia Continue statins.  Type 2 diabetes mellitus without complication, without long-term current use of insulin (HCC): He received steroids single dose, which makes his blood glucose erratic. Increase long-acting insulin continue sliding scale CBGs before meals and at bedtime.   Obesity: Noted has been counseled.  Anxiety/depression: Continue trazodone and fluoxetine  Benign prostatic  hyperplasia Cont home meds   DVT prophylaxis: heparin Family Communication:none Status is: Inpatient Remains inpatient appropriate because: NSTEMI    Code Status:     Code Status Orders  (From admission, onward)           Start     Ordered   04/09/23 1631  Full code  Continuous       Question:  By:  Answer:  Consent: discussion documented in EHR   04/09/23 1632           Code Status History     Date Active Date Inactive Code Status Order ID Comments User Context   11/19/2022 1812 11/25/2022 0002 Full Code [id number]  Emeline General, MD ED         IV Access:   Peripheral IV   Procedures and diagnostic studies:   CARDIAC CATHETERIZATION  Result Date: 04/12/2023 Severe left main disease with diffuse 80-90% calcified stenosis Severe mid-LAD stenosis of 80% Critical ostial RCA stenosis with a 95-99% ulcerated plaque Nonobstructive LCx stenosis Mild-moderate segmental LV dysfunction with severely elevated LVEDP Recommend: TCTS consult for consideration of CABG. Resume heparin. Diuresis as tolerated.     Medical Consultants:   None.   Subjective:    Michael Rose any chest pain or further diarrhea.  Objective:    Vitals:   04/12/23 1822 04/12/23 1827 04/12/23 2007 04/13/23 0350  BP: 112/82 119/82 125/89 131/89  Pulse: 100 100 93 83  Resp: (!) 31 (!) 30 16 16   Temp:   97.8 F (36.6 C) 98.1 F (36.7 C)  TempSrc:   Oral Oral  SpO2: 94% 93% 92% 95%  Weight:      Height:       SpO2: 95 % O2 Flow Rate (L/min): 2 L/min  Intake/Output Summary (Last 24 hours) at 04/13/2023 0747 Last data filed at 04/13/2023 0353 Gross per 24 hour  Intake 233.74 ml  Output 2350 ml  Net -2116.26 ml   Filed Weights   04/10/23 0533  Weight: 97.4 kg    Exam: General exam: In no acute distress. Respiratory system: Good air movement and clear to auscultation. Cardiovascular system: S1 & S2 heard, RRR. No JVD. Gastrointestinal system: Abdomen is nondistended,  soft and nontender.  Extremities: No pedal edema. Skin: No rashes, lesions or ulcers Psychiatry: Judgement and insight appear normal. Mood & affect appropriate.  Data Reviewed:    Labs: Basic Metabolic Panel: Recent Labs  Lab 04/09/23 1747 04/10/23 0702 04/12/23 0518  NA 141 136 135  K 3.5 3.4* 3.1*  CL 102 103 102  CO2 27 23 24   GLUCOSE 111* 128* 153*  BUN 24* 26* 16  CREATININE 1.03 0.97 0.73  CALCIUM 8.6* 8.2* 8.3*  MG 2.1  --   --    GFR Estimated Creatinine Clearance: 99.3 mL/min (by C-G formula based on SCr of 0.73 mg/dL). Liver Function Tests: No results for input(s): "AST", "ALT", "ALKPHOS", "BILITOT", "PROT", "ALBUMIN" in the last 168 hours. No results for input(s): "LIPASE", "AMYLASE" in the last 168 hours. No results for input(s): "AMMONIA" in the last 168 hours. Coagulation profile No results for input(s): "INR", "PROTIME" in the last 168 hours. COVID-19 Labs  No results for input(s): "DDIMER", "FERRITIN", "LDH", "CRP" in the last 72 hours.  No results found for: "SARSCOV2NAA"  CBC: Recent Labs  Lab 04/09/23 1747 04/11/23 0423 04/12/23 0518 04/13/23 0556  WBC 9.9 10.2 11.2* 12.2*  NEUTROABS 8.4*  --   --   --   HGB 12.9* 11.1* 11.1* 11.7*  HCT 40.5 34.0* 34.3* 35.5*  MCV 78.5* 75.9* 75.6* 74.6*  PLT 141* 121* 130* 162   Cardiac Enzymes: No results for input(s): "CKTOTAL", "CKMB", "CKMBINDEX", "TROPONINI" in the last 168 hours. BNP (last 3 results) No results for input(s): "PROBNP" in the last 8760 hours. CBG: Recent Labs  Lab 04/11/23 2104 04/12/23 0736 04/12/23 1203 04/12/23 1551 04/12/23 2120  GLUCAP 169* 139* 143* 138* 200*   D-Dimer: No results for input(s): "DDIMER" in the last 72 hours. Hgb A1c: No results for input(s): "HGBA1C" in the last 72 hours. Lipid Profile: No results for input(s): "CHOL", "HDL", "LDLCALC", "TRIG", "CHOLHDL", "LDLDIRECT" in the last 72 hours.  Thyroid function studies: No results for input(s): "TSH",  "T4TOTAL", "T3FREE", "THYROIDAB" in the last 72 hours.  Invalid input(s): "FREET3" Anemia work up: No results for input(s): "VITAMINB12", "FOLATE", "FERRITIN", "TIBC", "IRON", "RETICCTPCT" in the last 72 hours. Sepsis Labs: Recent Labs  Lab 04/09/23 1747 04/11/23 0423 04/12/23 0518 04/13/23 0556  WBC 9.9 10.2 11.2* 12.2*   Microbiology Recent Results (from the past 240 hour(s))  Respiratory (~20 pathogens) panel by PCR     Status: None   Collection Time: 04/09/23  5:43 PM   Specimen: Nasopharyngeal Swab; Respiratory  Result Value Ref Range Status   Adenovirus NOT DETECTED NOT DETECTED Final   Coronavirus 229E NOT DETECTED NOT DETECTED Final    Comment: (NOTE) The Coronavirus on the Respiratory Panel, DOES NOT test for the novel  Coronavirus (2019 nCoV)    Coronavirus HKU1 NOT DETECTED NOT DETECTED Final   Coronavirus NL63 NOT DETECTED NOT DETECTED Final   Coronavirus OC43 NOT DETECTED NOT DETECTED Final   Metapneumovirus NOT DETECTED NOT DETECTED Final   Rhinovirus / Enterovirus NOT DETECTED NOT DETECTED Final  Influenza A NOT DETECTED NOT DETECTED Final   Influenza B NOT DETECTED NOT DETECTED Final   Parainfluenza Virus 1 NOT DETECTED NOT DETECTED Final   Parainfluenza Virus 2 NOT DETECTED NOT DETECTED Final   Parainfluenza Virus 3 NOT DETECTED NOT DETECTED Final   Parainfluenza Virus 4 NOT DETECTED NOT DETECTED Final   Respiratory Syncytial Virus NOT DETECTED NOT DETECTED Final   Bordetella pertussis NOT DETECTED NOT DETECTED Final   Bordetella Parapertussis NOT DETECTED NOT DETECTED Final   Chlamydophila pneumoniae NOT DETECTED NOT DETECTED Final   Mycoplasma pneumoniae NOT DETECTED NOT DETECTED Final    Comment: Performed at [facility], [address], [place], [place] [address]     Medications:    aspirin EC  81 mg Oral Daily   atorvastatin  40 mg Oral Daily   diclofenac Sodium  2 g Topical QID   feeding supplement (GLUCERNA SHAKE)  237 mL Oral TID BM    fluvoxaMINE  200 mg Oral Daily   furosemide  40 mg Intravenous BID   insulin aspart  0-15 Units Subcutaneous TID WC   insulin aspart  0-5 Units Subcutaneous QHS   insulin aspart  4 Units Subcutaneous TID WC   insulin glargine-yfgn  5 Units Subcutaneous QHS   isosorbide mononitrate  30 mg Oral Daily   melatonin  10 mg Oral QHS   metoprolol succinate  100 mg Oral Daily   pantoprazole  40 mg Oral Daily   QUEtiapine  25 mg Oral QHS   sodium chloride flush  3 mL Intravenous Q12H   traZODone  150 mg Oral QHS   Continuous Infusions:  sodium chloride     heparin 1,950 Units/hr (04/13/23 0313)      LOS: 4 days   Marinda Elk  [facility]  04/13/2023, 7:47 AM

## 2023-04-13 NOTE — Care Management Important Message (Signed)
Important Message  Patient Details  Name: Michael Rose MRN: [id number] Date of Birth: 1950-11-30   Important Message Given:  Yes - Medicare IM     Dorena Bodo 04/13/2023, 1:24 PM

## 2023-04-13 NOTE — Progress Notes (Signed)
Physical Therapy Re-Evaluation Patient Details Name: Michael Rose MRN: [id number] DOB: 06/19/1950 Today's Date: 04/13/2023  History of Present Illness  Pt is 72 year old presented to [facility] on  04/09/23 from [facility] for likely viral gastroenteritis and encephalopathy in setting on profound hypokalemia. Pt with elevated troponin and unclear if NSTEMI vs demand ischemia. Cardiology consulted and underwent cardiac cath on 11/11. On 04/13/23 pt developed new onset of Lt facial droop, Lt UE ataxia, and slurred speech. 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   Clinical Impression  Michael Rose is 72 y.o. male admitted with above HPI and diagnosis. 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 He was limited this session by significant pain at sacrum and Max assist required today for rolling Rt/Lt with Total assist for pericare due to BM. Sacral wound noted to have pink purulent drainage and pt has tenderness to palpation around periwound with increased drainage with palpation. MD and RN notified and mattress replacement for low air loss mattress to protect skin integrity ordered. Patient will benefit from continued skilled PT interventions to address impairments and progress independence with mobility. Continue to recommend follow up rehab in inpatient setting. Pending activity tolerance pt may benefit from intense rehab follow up >3 hours/day. Acute PT will follow and progress as able and updated recommendations as appropriate.      If plan is discharge home, recommend the following: Two people to help with walking and/or transfers;Two people to help with bathing/dressing/bathroom;Assistance with cooking/housework;Assist for transportation;Supervision due to  cognitive status;Help with stairs or ramp for entrance;Assistance with feeding   Can travel by private vehicle   No    Equipment Recommendations None recommended by PT  Recommendations for Other Services  Rehab consult (if pt progresses tolerance may be candidate new CVA)    Functional Status Assessment Patient has had a recent decline in their functional status and demonstrates the ability to make significant improvements in function in a reasonable and predictable amount of time.     Precautions / Restrictions Precautions Precautions: Fall Precaution Comments: very anxious Restrictions Weight Bearing Restrictions: No      Mobility  Bed Mobility Overal bed mobility: Needs Assistance Bed Mobility: Rolling Rolling: Max assist, Used rails         General bed mobility comments: max assist to roll Rt/Lt due for pericare due to soiled with BM    Transfers                   General transfer comment: deferred due to pain and fatigue    Ambulation/Gait                  Stairs            Wheelchair Mobility     Tilt Bed    Modified Rankin (Stroke Patients Only) Modified Rankin (Stroke Patients Only) Pre-Morbid Rankin Score: Moderately severe disability Modified Rankin: Severe disability     Balance                                             Pertinent Vitals/Pain Pain Assessment Pain Assessment: Faces Faces  Pain Scale: Hurts whole lot Pain Location: buttock at sacral wound Pain Descriptors / Indicators: Grimacing, Guarding, Sore Pain Intervention(s): Limited activity within patient's tolerance, Monitored during session, Repositioned    Home Living Family/patient expects to be discharged to:: Private residence Living Arrangements: Spouse/significant other Available Help at Discharge: Family;Available 24 hours/day;Available PRN/intermittently (daughter and two sons; all work) Type of Home: Apartment Home Access:  Stairs to enter Entrance Stairs-Rails: Right;Left;Can reach both Secretary/administrator of Steps: 3   Home Layout: One level Home Equipment: Agricultural consultant (2 wheels);Cane - single point;Wheelchair - manual;Tub bench;Hand held shower head;BSC/3in1      Prior Function Prior Level of Function : Independent/Modified Independent             Mobility Comments: At baseline, pt uses a RW for functional mobility at home and a manual wheelchair in the community. has been primarily using WC since June 2024 after Rt Le fx and sx. ADLs Comments: At baseline, pt in Independent to Contact guard assist for ADLs.     Extremity/Trunk Assessment   Upper Extremity Assessment Upper Extremity Assessment: Defer to OT evaluation LUE Deficits / Details: FTN impaired on Lt    Lower Extremity Assessment Lower Extremity Assessment: RLE deficits/detail;LLE deficits/detail RLE Deficits / Details: grossly 3/5 or better for LE strength; heel to shin intact. RLE Coordination: WNL LLE Deficits / Details: 2-/5 or less for LE strength with hip flex, knee flex/ext, ankle Df/Pf. impaired heel to shin. LLE Coordination: decreased fine motor;decreased gross motor    Cervical / Trunk Assessment Cervical / Trunk Assessment: Kyphotic  Communication   Communication Communication: Hearing impairment  Cognition Arousal: Alert Behavior During Therapy: Anxious, WFL for tasks assessed/performed Overall Cognitive Status: No family/caregiver present to determine baseline cognitive functioning Area of Impairment: Attention, Safety/judgement, Awareness, Problem solving, Following commands                   Current Attention Level: Sustained   Following Commands: Follows one step commands consistently, Follows one step commands with increased time Safety/Judgement: Decreased awareness of safety, Decreased awareness of deficits Awareness: Emergent Problem Solving: Slow processing, Decreased initiation, Difficulty  sequencing, Requires verbal cues General Comments: Pt with poor attention and anxious. follows one step commands consistently but does benefit from cues for problem solving and sequencing.        General Comments      Exercises     Assessment/Plan    PT Assessment Patient needs continued PT services  PT Problem List Decreased strength;Decreased activity tolerance;Decreased balance;Decreased mobility;Decreased cognition;Pain;Decreased safety awareness;Decreased knowledge of use of DME;Decreased coordination;Decreased skin integrity;Obesity       PT Treatment Interventions DME instruction;Gait training;Functional mobility training;Therapeutic activities;Therapeutic exercise;Balance training;Patient/family education;Neuromuscular re-education;Wheelchair mobility training;Manual techniques;Cognitive remediation    PT Goals (Current goals can be found in the Care Plan section)  Acute Rehab PT Goals Patient Stated Goal: get better PT Goal Formulation: With patient Time For Goal Achievement: 04/13/23 Potential to Achieve Goals: Good    Frequency Min 1X/week     Co-evaluation               AM-PAC PT "6 Clicks" Mobility  Outcome Measure Help needed turning from your back to your side while in a flat bed without using bedrails?: Total Help needed moving from lying on your back to sitting on the side of a flat bed without using bedrails?: Total Help needed moving to and from a bed to a chair (including a wheelchair)?: Total Help needed standing up  from a chair using your arms (e.g., wheelchair or bedside chair)?: Total Help needed to walk in hospital room?: Total Help needed climbing 3-5 steps with a railing? : Total 6 Click Score: 6    End of Session   Activity Tolerance: Patient limited by pain;Patient limited by fatigue Patient left: in bed;with call bell/phone within reach;with bed alarm set;with family/visitor present Nurse Communication: Mobility status;Need for lift  equipment PT Visit Diagnosis: Unsteadiness on feet (R26.81);Other abnormalities of gait and mobility (R26.89);Repeated falls (R29.6);Muscle weakness (generalized) (M62.81);Pain;Hemiplegia and hemiparesis Hemiplegia - Right/Left: Left Hemiplegia - dominant/non-dominant: Non-dominant Hemiplegia - caused by: Cerebral infarction Pain - part of body:  (buttock/sacrum)    Time: 1524-1610 PT Time Calculation (min) (ACUTE ONLY): 46 min   Charges:   PT Evaluation $PT Re-evaluation: 1 Re-eval PT Treatments $Therapeutic Activity: 23-37 mins PT General Charges $$ ACUTE PT VISIT: 1 Visit         Wynn Maudlin, DPT Acute Rehabilitation Services Office [phone]  04/13/23 5:03 PM

## 2023-04-13 NOTE — TOC Progression Note (Signed)
Transition of Care [facility]) - Progression Note    Patient Details  Name: Michael Rose MRN: [id number] Date of Birth: [birth year]  Transition of Care [facility] CM/SW Contact  Delilah Shan, LCSWA Phone Number: 04/13/2023, 12:19 PM  Clinical Narrative:     CSW withdrew insurance authorization for SNF and PTAR. CSW following to restart insurance authorization for SNF and PTAR closer to patient being medically ready for dc.  Expected Discharge Plan: Skilled Nursing Facility Barriers to Discharge: Continued Medical Work up  Expected Discharge Plan and Services In-house Referral: Clinical Social Work   Post Acute Care Choice: Skilled Nursing Facility Living arrangements for the past 2 months: Skilled Nursing Facility                                       Social Determinants of Health (SDOH) Interventions SDOH Screenings   Food Insecurity: No Food Insecurity (04/10/2023)  Housing: Low Risk  (04/10/2023)  Transportation Needs: No Transportation Needs (04/10/2023)  Utilities: Not At Risk (04/10/2023)  Financial Resource Strain: High Risk (01/19/2022)  Physical Activity: Insufficiently Active (07/21/2022)   Received from [facility], [facility]  Social Connections: Socially Integrated (07/21/2022)   Received from [facility], [facility]  Stress: No Stress Concern Present (07/21/2022)   Received from [facility], [facility]  Tobacco Use: Low Risk  (04/10/2023)  Health Literacy: Low Risk  (07/21/2022)   Received from [facility], [facility]    Readmission Risk Interventions     No data to display

## 2023-04-13 NOTE — Consult Note (Cosign Needed)
[address]       [place] [address]             [phone]        DACORION ASMAR [facility] Medical Record #[id number] Date of Birth: 1951-05-06  Referring: Karsten Fells* Primary Care: [name], MD Primary Cardiologist:None  Chief Complaint:   Non-STEMI, presentation with shortness of breath  History of Present Illness:    Patient is a 72 year old male with significant cardiac risk factors including hypertension, hyperlipidemia, diabetes mellitus, known coronary artery disease status post PCI as well as obesity.  He presented to [facility] facility with shortness of breath.  This was on 04/08/2023.  He had noted symptoms for approximately 1 week and was also associated with some nausea/vomiting.  He did deny pain.  He described an abnormal sensation in his temples which was similar to symptoms he had from his previous heart attack.  A CT scan done at that facility showed no acute abnormality but did demonstrate a chronic infarct.  Troponin was noted to be elevated with upward trend peaking at 2700.  He was felt to require transfer to [name] for further cardiology care/evaluation including catheterization and medical stabilization for non-STEMI.  Cardiology consultation was obtained.  Catheterization done on 04/12/2023 showed severe left main disease with a 80-90% calcified stenosis.  Additionally there was a severe 80% mid-LAD stenosis.  There was critical ostial RCA stenosis with a 95-99% ulcerated plaque.  There was nonobstructive left circumflex disease.  There is mild to moderate segmental LV dysfunction with severely elevated LVEDP.  Echocardiogram showed LV ejection fraction by estimation at 40 to 45%.  The left ventricle showed mildly decreased function with regional wall motion abnormalities.  Please see the full report described below.  We are asked to see the patient for consideration of coronary artery surgical  revascularization(CABG)    Current Activity/ Functional Status: Patient was not independent with mobility/ambulation, transfers, ADL's, IADL's. Uses wheelchair he states 90% of the time   Zubrod Score: At the time of surgery this patient's most appropriate activity status/level should be described as: []     0    Normal activity, no symptoms []     1    Restricted in physical strenuous activity but ambulatory, able to do out light work []     2    Ambulatory and capable of self care, unable to do work activities, up and about                 more than 50%  Of the time                            []     3    Only limited self care, in bed greater than 50% of waking hours []     4    Completely disabled, no self care, confined to bed or chair []     5    Moribund  Past Medical History:  Diagnosis Date   Allergic reaction to contrast dye    Bipolar affective disorder (HCC)    Coronary atherosclerosis of native coronary artery    BMS to RCA 2008, [facility], Dr. Katrinka Blazing   Diabetes mellitus without complication (HCC)    GERD (gastroesophageal reflux disease)    Hard of hearing    Right ear   Hyperlipidemia    Hypertension    Myocardial infarction (HCC)  IMI 2008   Osteoarthritis    Ringing in right ear     Past Surgical History:  Procedure Laterality Date   CHOLECYSTECTOMY  2013   JOINT REPLACEMENT     LEFT HEART CATH AND CORONARY ANGIOGRAPHY N/A 04/12/2023   Procedure: LEFT HEART CATH AND CORONARY ANGIOGRAPHY;  Surgeon: [name], MD;  Location: [facility] INVASIVE CV LAB;  Service: Cardiovascular;  Laterality: N/A;   ORIF FEMUR FRACTURE Right 11/20/2022   Procedure: OPEN REDUCTION INTERNAL FIXATION (ORIF) DISTAL FEMUR FRACTURE;  Surgeon: Roby Lofts, MD;  Location: [facility] OR;  Service: Orthopedics;  Laterality: Right;   TOTAL KNEE ARTHROPLASTY     Left   TOTAL KNEE ARTHROPLASTY Right 10/21/2012   Dr Madelon Lips   TOTAL KNEE ARTHROPLASTY Right 10/21/2012   Procedure: TOTAL KNEE  ARTHROPLASTY;  Surgeon: Thera Flake., MD;  Location: [facility] OR;  Service: Orthopedics;  Laterality: Right;   VASECTOMY      Social History   Tobacco Use  Smoking Status Never  Smokeless Tobacco Never    Social History   Substance and Sexual Activity  Alcohol Use No   Alcohol/week: 0.0 standard drinks of alcohol     Allergies  Allergen Reactions   Iodinated Contrast Media Hives and Itching   Penicillins Other (See Comments)    Unknown reaction in childhood    Current Facility-Administered Medications  Medication Dose Route Frequency Provider Last Rate Last Admin   0.9 %  sodium chloride infusion  250 mL Intravenous PRN [name], MD       acetaminophen (TYLENOL) tablet 650 mg  650 mg Oral Q4H PRN [name], MD   650 mg at 04/10/23 1625   aspirin EC tablet 81 mg  81 mg Oral Daily [name], MD   81 mg at 04/12/23 0857   atorvastatin (LIPITOR) tablet 40 mg  40 mg Oral Daily [name], MD   40 mg at 04/12/23 0857   diclofenac Sodium (VOLTAREN) 1 % topical gel 2 g  2 g Topical QID [name], MD   2 g at 04/12/23 2227   feeding supplement (GLUCERNA SHAKE) (GLUCERNA SHAKE) liquid 237 mL  237 mL Oral TID BM [name], MD   237 mL at 04/12/23 2000   fluvoxaMINE (LUVOX) tablet 200 mg  200 mg Oral Daily [name], MD   200 mg at 04/12/23 0857   furosemide (LASIX) injection 40 mg  40 mg Intravenous BID [name], MD   40 mg at 04/12/23 1904   heparin ADULT infusion 100 units/mL (25000 units/271mL)  1,950 Units/hr Intravenous Continuous Kendal Hymen, RPH 19.5 mL/hr at 04/13/23 0313 1,950 Units/hr at 04/13/23 0313   HYDROcodone-acetaminophen (NORCO) 7.5-325 MG per tablet 1 tablet  1 tablet Oral Q6H PRN [name], MD   1 tablet at 04/12/23 2225   insulin aspart (novoLOG) injection 0-15 Units  0-15 Units Subcutaneous TID WC [name], MD   2 Units at 04/12/23 0858   insulin aspart (novoLOG) injection 0-5 Units  0-5 Units  Subcutaneous QHS [name], MD       insulin aspart (novoLOG) injection 4 Units  4 Units Subcutaneous TID WC [name], MD   4 Units at 04/11/23 1756   insulin glargine-yfgn (SEMGLEE) injection 10 Units  10 Units Subcutaneous BID Marinda Elk, MD       isosorbide mononitrate (IMDUR) 24 hr tablet 30 mg  30 mg Oral Daily [name], MD   30 mg at 04/12/23 [phone]  melatonin tablet 10 mg  10 mg Oral Mickel Crow, MD   10 mg at 04/12/23 2224   metoprolol succinate (TOPROL-XL) 24 hr tablet 100 mg  100 mg Oral Daily [name], MD   100 mg at 04/12/23 0857   nitroGLYCERIN (NITROSTAT) SL tablet 0.4 mg  0.4 mg Sublingual Q5 Min x 3 PRN [name], MD       ondansetron [facility]) injection 4 mg  4 mg Intravenous Q6H PRN [name], MD       pantoprazole (PROTONIX) EC tablet 40 mg  40 mg Oral Daily [name], MD   40 mg at 04/12/23 0857   polyethylene glycol (MIRALAX / GLYCOLAX) packet 17 g  17 g Oral Daily PRN [name], MD       QUEtiapine (SEROQUEL) tablet 25 mg  25 mg Oral Mickel Crow, MD   25 mg at 04/12/23 2225   sodium chloride flush (NS) 0.9 % injection 3 mL  3 mL Intravenous Freada Bergeron, MD   3 mL at 04/12/23 2200   sodium chloride flush (NS) 0.9 % injection 3 mL  3 mL Intravenous PRN [name], MD       traZODone (DESYREL) tablet 150 mg  150 mg Oral Mickel Crow, MD   150 mg at 04/12/23 2224    Medications Prior to Admission  Medication Sig Dispense Refill Last Dose   aspirin 81 MG tablet Take 81 mg by mouth daily.   04/08/2023   atorvastatin (LIPITOR) 10 MG tablet Take 10 mg by mouth daily.   04/08/2023   empagliflozin (JARDIANCE) 25 MG TABS tablet Take 25 mg by mouth daily.   04/08/2023   fluvoxaMINE (LUVOX) 100 MG tablet Take 200 mg by mouth daily.   04/08/2023   furosemide (LASIX) 40 MG tablet Take 40 mg by mouth daily.   04/08/2023   HYDROcodone-acetaminophen (NORCO) 7.5-325 MG tablet Take 1 tablet by mouth  every 6 (six) hours as needed for moderate pain (pain score 4-6) or severe pain (pain score 7-10).   04/08/2023   IBU 800 MG tablet Take 800 mg by mouth every 8 (eight) hours as needed for headache or moderate pain.   04/08/2023   Insulin Degludec (TRESIBA) 100 UNIT/ML SOLN Inject 14 Units into the skin at bedtime.   04/08/2023   isosorbide mononitrate (IMDUR) 30 MG 24 hr tablet TAKE 1 TABLET BY MOUTH EVERY DAY (Patient taking differently: Take 30 mg by mouth daily.) 30 tablet 6 04/08/2023   levocetirizine (XYZAL) 5 MG tablet Take 5 mg by mouth daily.   04/08/2023   methocarbamol (ROBAXIN) 500 MG tablet Take 1 tablet (500 mg total) by mouth every 6 (six) hours as needed for muscle spasms. 20 tablet 0 04/08/2023   metoprolol succinate (TOPROL-XL) 50 MG 24 hr tablet TAKE 1 TABLET BY MOUTH EVERY MORNING AND TAKE 1/2 TABLET EVERY EVENING - NEEDS APPOINTMENT (Patient taking differently: Take 50 mg by mouth daily.) 15 tablet 0 04/08/2023   NOVOLIN N 100 UNIT/ML injection Inject 10-35 Units into the skin See admin instructions. Inject 10 units in the morning, 35 units with lunch, 10 units at bedtime.   04/08/2023   OZEMPIC, 1 MG/DOSE, 4 MG/3ML SOPN Inject 1 mg into the skin every Monday.   Past Week   pantoprazole (PROTONIX) 40 MG tablet Take 40 mg by mouth daily.   04/08/2023   polyethylene glycol (MIRALAX / GLYCOLAX) 17 g packet Take 17 g by mouth 2 (two) times daily. (  Patient taking differently: Take 17 g by mouth daily as needed for mild constipation, moderate constipation or severe constipation.) 14 each 0 Unk   traZODone (DESYREL) 150 MG tablet Take 150 mg by mouth at bedtime.   04/08/2023    Family History  Problem Relation Age of Onset   Heart disease Mother    Heart disease Father      Review of Systems:   Review of Systems  Constitutional:  Positive for malaise/fatigue.  Respiratory:  Positive for shortness of breath.   Cardiovascular:  Positive for leg swelling.  Gastrointestinal:  Positive for  nausea and vomiting.  Musculoskeletal:  Positive for joint pain and myalgias.  Neurological:  Positive for focal weakness and headaches.           Physical Exam: BP 131/89 (BP Location: Right Arm)   Pulse 83   Temp 98.1 F (36.7 C) (Oral)   Resp 16   Ht 5\' 11"  (1.803 m)   Wt 97.4 kg   SpO2 95%   BMI 29.94 kg/m    General appearance: alert, cooperative, appears stated age, fatigued, moderately obese, and slowed mentation Head: Normocephalic, without obvious abnormality, atraumatic Neck: no adenopathy, no carotid bruit, no JVD, supple, symmetrical, trachea midline, and thyroid not enlarged, symmetric, no tenderness/mass/nodules Lymph nodes: Cervical, supraclavicular, and axillary nodes normal. Resp: clear to auscultation bilaterally Back: symmetric, no curvature. ROM normal. No CVA tenderness. Cardio: regular rate and rhythm, S1, S2 normal, no murmur, click, rub or gallop GI: soft, non-tender; bowel sounds normal; no masses,  no organomegaly Extremities: palpable pedal pulses Neurologic: Mental status: Alert, oriented, thought content appropriate, alertness: lethargic Motor: decreased strength left leg, right is somewhat weak but significantly better than the left currently. Coordination: generally decreased + dysarthria   Diagnostic Studies & Laboratory data:     Recent Radiology Findings:   CARDIAC CATHETERIZATION  Result Date: 04/12/2023 Severe left main disease with diffuse 80-90% calcified stenosis Severe mid-LAD stenosis of 80% Critical ostial RCA stenosis with a 95-99% ulcerated plaque Nonobstructive LCx stenosis Mild-moderate segmental LV dysfunction with severely elevated LVEDP Recommend: TCTS consult for consideration of CABG. Resume heparin. Diuresis as tolerated.          ECHOCARDIOGRAM REPORT       Patient Name:   Michael Rose Kuhrt Date of Exam: 04/10/2023  Medical Rec #:  [id number]      Height:       71.0 in  Accession #:    [id number]     Weight:        214.7 lb  Date of Birth:  07/06/1950       BSA:          2.173 m  Patient Age:    72 years       BP:           129/71 mmHg  Patient Gender: M              HR:           96 bpm.  Exam Location:  Inpatient   Procedure: 2D Echo, Color Doppler, Cardiac Doppler and Intracardiac             Opacification Agent   Indications:    NSTEMI    History:        Patient has prior history of Echocardiogram examinations,  most                 recent 11/20/2022. CAD and NSTEMI; Risk  Factors:Hypertension,                 Dyslipidemia and Diabetes.    Sonographer:    [facility]  Referring Phys: [id number] Cecille Po MELVIN   IMPRESSIONS     1. No thrombus is seen (Definity contrast was used). Left ventricular  ejection fraction, by estimation, is 40 to 45%. The left ventricle has  mildly decreased function. The left ventricle demonstrates regional wall  motion abnormalities (see scoring  diagram/findings for description). Left ventricular diastolic parameters  are consistent with Grade I diastolic dysfunction (impaired relaxation). A  very small area of the inferoapical "cap" appears to be frankly  dyskinetic.   2. Right ventricular systolic function is normal. The right ventricular  size is normal. Tricuspid regurgitation signal is inadequate for assessing  PA pressure.   3. Left atrial size was moderately dilated.   4. The mitral valve is normal in structure. Mild to moderate mitral valve  regurgitation.   5. The aortic valve is tricuspid. There is mild calcification of the  aortic valve. There is mild thickening of the aortic valve. Aortic valve  regurgitation is not visualized. Aortic valve sclerosis/calcification is  present, without any evidence of  aortic stenosis.   6. The inferior vena cava is dilated in size with <50% respiratory  variability, suggesting right atrial pressure of 15 mmHg.   Comparison(s): Prior images reviewed side by side. The left ventricular  function is worsened. The left  ventricular wall motion is new.   FINDINGS   Left Ventricle: No thrombus is seen (Definity contrast was used). Left  ventricular ejection fraction, by estimation, is 40 to 45%. The left  ventricle has mildly decreased function. The left ventricle demonstrates  regional wall motion abnormalities.  Definity contrast agent was given IV to delineate the left ventricular  endocardial borders. The left ventricular internal cavity size was normal  in size. There is no left ventricular hypertrophy. Left ventricular  diastolic parameters are consistent with  Grade I diastolic dysfunction (impaired relaxation). Indeterminate filling  pressures.     LV Wall Scoring:  The apex is dyskinetic. The entire inferior wall and posterior wall are  hypokinetic. A very small area of the inferoapical "cap" appears to be  frankly dyskinetic.   Right Ventricle: The right ventricular size is normal. Right vetricular  wall thickness was not well visualized. Right ventricular systolic  function is normal. Tricuspid regurgitation signal is inadequate for  assessing PA pressure.   Left Atrium: Left atrial size was moderately dilated.   Right Atrium: Right atrial size was normal in size.   Pericardium: There is no evidence of pericardial effusion.   Mitral Valve: The mitral valve is normal in structure. Mild to moderate  mitral valve regurgitation, with centrally-directed jet.   Tricuspid Valve: The tricuspid valve is normal in structure. Tricuspid  valve regurgitation is not demonstrated.   Aortic Valve: The aortic valve is tricuspid. There is mild calcification  of the aortic valve. There is mild thickening of the aortic valve. Aortic  valve regurgitation is not visualized. Aortic valve  sclerosis/calcification is present, without any  evidence of aortic stenosis. Aortic valve mean gradient measures 6.0 mmHg.  Aortic valve peak gradient measures 10.6 mmHg. Aortic valve area, by VTI  measures 1.89 cm.    Pulmonic Valve: The pulmonic valve was normal in structure. Pulmonic valve  regurgitation is not visualized.   Aorta: The aortic root is normal in size and structure.   Venous: The inferior  vena cava is dilated in size with less than 50%  respiratory variability, suggesting right atrial pressure of 15 mmHg.   IAS/Shunts: No atrial level shunt detected by color flow Doppler.     LEFT VENTRICLE  PLAX 2D  LVIDd:         5.50 cm   Diastology  LVIDs:         4.30 cm   LV e' medial:    7.07 cm/s  LV PW:         1.10 cm   LV E/e' medial:  13.0  LV IVS:        1.00 cm   LV e' lateral:   9.79 cm/s  LVOT diam:     2.20 cm   LV E/e' lateral: 9.4  LV SV:         52  LV SV Index:   24  LVOT Area:     3.80 cm     RIGHT VENTRICLE  RV Basal diam:  3.10 cm  RV Mid diam:    1.70 cm  RV S prime:     12.90 cm/s  TAPSE (M-mode): 2.1 cm   LEFT ATRIUM             Index        RIGHT ATRIUM           Index  LA diam:        4.70 cm 2.16 cm/m   RA Area:     14.50 cm  LA Vol (A2C):   68.2 ml 31.38 ml/m  RA Volume:   31.10 ml  14.31 ml/m  LA Vol (A4C):   44.0 ml 20.25 ml/m  LA Biplane Vol: 54.3 ml 24.99 ml/m   AORTIC VALVE  AV Area (Vmax):    1.91 cm  AV Area (Vmean):   1.82 cm  AV Area (VTI):     1.89 cm  AV Vmax:           163.00 cm/s  AV Vmean:          113.000 cm/s  AV VTI:            0.275 m  AV Peak Grad:      10.6 mmHg  AV Mean Grad:      6.0 mmHg  LVOT Vmax:         81.80 cm/s  LVOT Vmean:        54.000 cm/s  LVOT VTI:          0.137 m  LVOT/AV VTI ratio: 0.50    AORTA  Ao Root diam: 3.60 cm   MITRAL VALVE  MV Area (PHT): 4.29 cm     SHUNTS  MV Decel Time: 177 msec     Systemic VTI:  0.14 m  MV E velocity: 92.20 cm/s   Systemic Diam: 2.20 cm  MV A velocity: 113.00 cm/s  MV E/A ratio:  0.82   Mihai Croitoru MD  Electronically signed by Thurmon Fair MD  Signature Date/Time: 04/10/2023/6:09:56 PM        Final      I have independently reviewed the above  radiologic studies and discussed with the patient   Recent Lab Findings: Lab Results  Component Value Date   WBC 12.2 (H) 04/13/2023   HGB 11.7 (L) 04/13/2023   HCT 35.5 (L) 04/13/2023   PLT 162 04/13/2023   GLUCOSE 153 (H) 04/12/2023   CHOL 111 04/10/2023   TRIG 94 04/10/2023  HDL 41 04/10/2023   LDLCALC 51 04/10/2023   ALT 25 10/13/2012   AST 22 10/13/2012   NA 135 04/12/2023   K 3.1 (L) 04/12/2023   CL 102 04/12/2023   CREATININE 0.73 04/12/2023   BUN 16 04/12/2023   CO2 24 04/12/2023   INR 1.03 10/13/2012   HGBA1C 6.8 (H) 11/19/2022      Assessment / Plan: Severe left main and two-vessel coronary artery disease in the setting of non-STEMI.  He has had previous PCI, uncertain vessel in 2010.  Reduced LVEF and elevated LVEDP.  Previous history of MI ?  New onset dysarthria and difficulty using left lower extremity.  He does have a previous CVA by CT scan.  Hospitalist is aware of these findings and it is uncertain how acute they are.  Plans to order brain MRI. DM 2 Bipolar affective disorder GERD Hard of hearing right ear Hyperlipidemia Hypertension Osteoarthritis Tinnitus History of gout Obesity Previous femoral fracture left leg    Plan: Uncertain if findings on neuroexam are new CVA.  MRI has been ordered.  Surgeon will evaluate patient and studies to determine if candidate to proceed with CABG in near future.  His mobility may be a limiting factor as he describes use of wheel chair most of the time with some use of a rolling walker. This apparently relates to osteoarthritis and previous orthopedic injuries as well as gout.   I  spent 40 minutes counseling the patient face to face.   Rowe Clack, PA-C  04/13/2023 9:16 AM    Agree with above 72yo male with LM/3V CAD CAD, mildly reduced LV EF, and mild to moderate MR.   He also has suffered an acute CVA with residual deficits.  He also has very limited mobility, and is currently being treated for a sacral  decubitus ulcer.  Given all of his comorbidities, and recent stroke, he likely would not recover well from a sternotomy.  No plans for surgery at this point.  His case will be discussed in cath conference.   Harrell Keane Scrape

## 2023-04-13 NOTE — Progress Notes (Signed)
Patient is now having left upper extremity weakness and ataxia, mild left facial droop and tongue shift from midline to left which are new findings.  Completed full neuro assessment, NIH stroke scale and Yale swallow screen (passed).  Initiating q2hr neuro checks per protocol.  Patient is pending MRI-brain.  Will continue to monitor closely.

## 2023-04-14 ENCOUNTER — Encounter (HOSPITAL_COMMUNITY): Payer: PPO

## 2023-04-14 ENCOUNTER — Other Ambulatory Visit (HOSPITAL_COMMUNITY): Payer: PPO

## 2023-04-14 ENCOUNTER — Inpatient Hospital Stay (HOSPITAL_COMMUNITY): Payer: PPO

## 2023-04-14 DIAGNOSIS — I214 Non-ST elevation (NSTEMI) myocardial infarction: Secondary | ICD-10-CM | POA: Diagnosis not present

## 2023-04-14 DIAGNOSIS — I639 Cerebral infarction, unspecified: Secondary | ICD-10-CM

## 2023-04-14 LAB — CBC
HCT: 35.3 % — ABNORMAL LOW (ref 39.0–52.0)
Hemoglobin: 11.5 g/dL — ABNORMAL LOW (ref 13.0–17.0)
MCH: 24.7 pg — ABNORMAL LOW (ref 26.0–34.0)
MCHC: 32.6 g/dL (ref 30.0–36.0)
MCV: 75.9 fL — ABNORMAL LOW (ref 80.0–100.0)
Platelets: 195 10*3/uL (ref 150–400)
RBC: 4.65 MIL/uL (ref 4.22–5.81)
RDW: 16.2 % — ABNORMAL HIGH (ref 11.5–15.5)
WBC: 15.4 10*3/uL — ABNORMAL HIGH (ref 4.0–10.5)
nRBC: 0 % (ref 0.0–0.2)

## 2023-04-14 LAB — GLUCOSE, CAPILLARY
Glucose-Capillary: 124 mg/dL — ABNORMAL HIGH (ref 70–99)
Glucose-Capillary: 154 mg/dL — ABNORMAL HIGH (ref 70–99)
Glucose-Capillary: 187 mg/dL — ABNORMAL HIGH (ref 70–99)
Glucose-Capillary: 135 mg/dL — ABNORMAL HIGH (ref 70–99)

## 2023-04-14 LAB — COMPREHENSIVE METABOLIC PANEL
ALT: 28 U/L (ref 0–44)
AST: 27 U/L (ref 15–41)
Albumin: 2.4 g/dL — ABNORMAL LOW (ref 3.5–5.0)
Alkaline Phosphatase: 69 U/L (ref 38–126)
Anion gap: 7 (ref 5–15)
BUN: 22 mg/dL (ref 8–23)
CO2: 28 mmol/L (ref 22–32)
Calcium: 8.2 mg/dL — ABNORMAL LOW (ref 8.9–10.3)
Chloride: 104 mmol/L (ref 98–111)
Creatinine, Ser: 1.11 mg/dL (ref 0.61–1.24)
GFR, Estimated: >60 mL/min (ref >60)
Glucose, Bld: 130 mg/dL — ABNORMAL HIGH (ref 70–99)
Potassium: 3 mmol/L — ABNORMAL LOW (ref 3.5–5.1)
Sodium: 139 mmol/L (ref 135–145)
Total Bilirubin: 0.9 mg/dL (ref <1.2)
Total Protein: 5.7 g/dL — ABNORMAL LOW (ref 6.5–8.1)

## 2023-04-14 LAB — HEPARIN LEVEL (UNFRACTIONATED): Heparin Unfractionated: 0.43 [IU]/mL (ref 0.30–0.70)

## 2023-04-14 MED ORDER — POTASSIUM CHLORIDE CRYS ER 20 MEQ PO TBCR
40.0000 meq | EXTENDED_RELEASE_TABLET | ORAL | Status: AC
  Administered 2023-04-14 (×2): 40 meq via ORAL
  Filled 2023-04-14 (×2): qty 2

## 2023-04-14 NOTE — Progress Notes (Signed)
STROKE TEAM PROGRESS NOTE   SUBJECTIVE (INTERVAL HISTORY) No family is at the bedside.  Patient lying in bed, overall his condition is stable.  On aspirin and heparin IV   OBJECTIVE Temp:  [90.7 F (32.6 C)-98.6 F (37 C)] 97.8 F (36.6 C) (11/13 0229) Pulse Rate:  [73-82] 82 (11/13 0957) Cardiac Rhythm: Normal sinus rhythm (11/13 0700) Resp:  [18-20] 20 (11/13 0229) BP: (98-125)/(61-79) 121/79 (11/13 0957) SpO2:  [93 %-97 %] 97 % (11/13 0952)  Recent Labs  Lab 04/13/23 1151 04/13/23 1556 04/13/23 2059 04/14/23 0826 04/14/23 1207  GLUCAP 189* 152* 189* 124* 154*   Recent Labs  Lab 04/09/23 1747 04/10/23 0702 04/12/23 0518 04/14/23 0817  NA 141 136 135 139  K 3.5 3.4* 3.1* 3.0*  CL 102 103 102 104  CO2 27 23 24 28   GLUCOSE 111* 128* 153* 130*  BUN 24* 26* 16 22  CREATININE 1.03 0.97 0.73 1.11  CALCIUM 8.6* 8.2* 8.3* 8.2*  MG 2.1  --   --   --    Recent Labs  Lab 04/14/23 0817  AST 27  ALT 28  ALKPHOS 69  BILITOT 0.9  PROT 5.7*  ALBUMIN 2.4*   Recent Labs  Lab 04/09/23 1747 04/11/23 0423 04/12/23 0518 04/13/23 0556 04/14/23 0328  WBC 9.9 10.2 11.2* 12.2* 15.4*  NEUTROABS 8.4*  --   --   --   --   HGB 12.9* 11.1* 11.1* 11.7* 11.5*  HCT 40.5 34.0* 34.3* 35.5* 35.3*  MCV 78.5* 75.9* 75.6* 74.6* 75.9*  PLT 141* 121* 130* 162 195   No results for input(s): "CKTOTAL", "CKMB", "CKMBINDEX", "TROPONINI" in the last 168 hours. No results for input(s): "LABPROT", "INR" in the last 72 hours. No results for input(s): "COLORURINE", "LABSPEC", "PHURINE", "GLUCOSEU", "HGBUR", "BILIRUBINUR", "KETONESUR", "PROTEINUR", "UROBILINOGEN", "NITRITE", "LEUKOCYTESUR" in the last 72 hours.  Invalid input(s): "APPERANCEUR"     Component Value Date/Time   CHOL 111 04/10/2023 0702   TRIG 94 04/10/2023 0702   HDL 41 04/10/2023 0702   CHOLHDL 2.7 04/10/2023 0702   VLDL 19 04/10/2023 0702   LDLCALC 51 04/10/2023 0702   Lab Results  Component Value Date   HGBA1C 6.8 (H)  11/19/2022   No results found for: "LABOPIA", "COCAINSCRNUR", "LABBENZ", "AMPHETMU", "THCU", "LABBARB"  No results for input(s): "ETH" in the last 168 hours.  I have personally reviewed the radiological images below and agree with the radiology interpretations.  MR ANGIO HEAD WO CONTRAST  Result Date: 04/14/2023 CLINICAL DATA:  Provided history: Stroke/TIA, determine embolic source. Altered mental status. Slurred speech. EXAM: MRA HEAD WITHOUT CONTRAST TECHNIQUE: Angiographic images of the Circle of Willis were acquired using MRA technique without intravenous contrast. COMPARISON:  Brain MRI 04/13/2023. FINDINGS: Anterior circulation: The intracranial internal carotid arteries are patent. The M1 middle cerebral arteries are patent. No M2 proximal branch occlusion or high-grade proximal stenosis. The anterior cerebral arteries are patent. 1-2 mm vascular protrusion arising from the proximal cavernous right ICA, which may reflect an aneurysm or the origin of an otherwise poorly delineated branch vessel (series 352, image 11). Posterior circulation: The intracranial vertebral arteries are patent. The right vertebral artery is developmentally diminutive beyond the PICA origin. The basilar artery is patent. The posterior cerebral arteries are patent. Severe stenosis within the right PCA at the P1/P2 junction. Posterior communicating arteries are present bilaterally. Anatomic variants: As described. IMPRESSION: 1. No intracranial proximal large vessel occlusion identified. 2. Severe stenosis within the right posterior cerebral artery at the P1/P2  junction. 3. 1-2 mm vascular protrusion arising from the proximal cavernous right internal carotid artery, which may reflect an aneurysm or the origin of an otherwise poorly delineated branch vessel. Electronically Signed   By: Jackey Loge D.O.   On: 04/14/2023 11:31   MR BRAIN WO CONTRAST  Result Date: 04/13/2023 CLINICAL DATA:  Mental status change, unknown  cause. Slurred speech beginning today. EXAM: MRI HEAD WITHOUT CONTRAST TECHNIQUE: Multiplanar, multiecho pulse sequences of the brain and surrounding structures were obtained without intravenous contrast. COMPARISON:  Head CT 04/08/2023 FINDINGS: Brain: Diffusion imaging shows acute infarction affecting the right para median pons. Small focus of acute infarction within the right cerebellar hemisphere. Two small foci of acute infarction affecting the right posterior temporal lobe and right occipital lobe. No other acute finding. Extensive chronic small-vessel ischemic changes seen otherwise throughout the cerebral hemispheric white matter. Old punctate foci of hemosiderin deposition associated with some of the old small vessel infarctions. Old basal ganglia infarction on the right with hemosiderin deposition. Ex vacuo enlargement of the frontal horn of the right lateral ventricle. No evidence of obstructive hydrocephalus. Chronic encephalomalacia the inferior frontal lobes, more extensive on the left than the right, the pattern most consistent with distant closed head injury. No mass lesion. No extra-axial collection. Vascular: Major vessels at the base of the brain show flow. This includes flow within both vertebral arteries and the basilar artery. The left vertebral artery is dominant, but the small non dominant right vertebral artery does appear to show flow. Skull and upper cervical spine: Negative Sinuses/Orbits: Clear/normal Other: None IMPRESSION: 1. Acute infarction affecting the right para median pons. Small focus of acute infarction within the right cerebellar hemisphere. Two small foci of acute infarction affecting the right posterior temporal lobe and right occipital lobe. Findings all consistent with posterior circulation insult. 2. Extensive chronic small-vessel ischemic changes otherwise throughout the cerebral hemispheric white matter. Old right basal ganglia infarction with hemosiderin deposition. 3.  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 Electronically Signed   By: Paulina Fusi M.D.   On: 04/13/2023 11:37   CARDIAC CATHETERIZATION  Result Date: 04/12/2023 Severe left main disease with diffuse 80-90% calcified stenosis Severe mid-LAD stenosis of 80% Critical ostial RCA stenosis with a 95-99% ulcerated plaque Nonobstructive LCx stenosis Mild-moderate segmental LV dysfunction with severely elevated LVEDP Recommend: TCTS consult for consideration of CABG. Resume heparin. Diuresis as tolerated.   DG Ankle 2 Views Left  Result Date: 04/11/2023 CLINICAL DATA:  Left ankle pain. EXAM: LEFT ANKLE - 2 VIEW COMPARISON:  None Available. FINDINGS: There is no evidence of acute fracture or dislocation. Degenerative changes are noted at the ankle and midfoot. Calcaneal spurring is noted. There are enthesopathic changes at the insertion site of the Achilles tendon. Vascular calcifications are noted in the soft tissues. IMPRESSION: 1. No acute fracture or dislocation. 2. Scattered degenerative changes. Electronically Signed   By: Thornell Sartorius M.D.   On: 04/11/2023 02:21   ECHOCARDIOGRAM COMPLETE  Result Date: 04/10/2023    ECHOCARDIOGRAM REPORT   Patient Name:   Michael Rose Date of Exam: 04/10/2023 Medical Rec #:  [id number]      Height:       71.0 in Accession #:    [id number]     Weight:       214.7 lb Date of Birth:  September 08, 1950       BSA:  2.173 m Patient Age:    72 years       BP:           129/71 mmHg Patient Gender: M              HR:           96 bpm. Exam Location:  Inpatient Procedure: 2D Echo, Color Doppler, Cardiac Doppler and Intracardiac            Opacification Agent Indications:    NSTEMI  History:        Patient has prior history of Echocardiogram examinations, most                 recent 11/20/2022. CAD and NSTEMI; Risk Factors:Hypertension,                 Dyslipidemia and Diabetes.  Sonographer:    [facility] Referring  Phys: [id number] Cecille Po MELVIN IMPRESSIONS  1. No thrombus is seen (Definity contrast was used). Left ventricular ejection fraction, by estimation, is 40 to 45%. The left ventricle has mildly decreased function. The left ventricle demonstrates regional wall motion abnormalities (see scoring diagram/findings for description). Left ventricular diastolic parameters are consistent with Grade I diastolic dysfunction (impaired relaxation). A very small area of the inferoapical "cap" appears to be frankly dyskinetic.  2. Right ventricular systolic function is normal. The right ventricular size is normal. Tricuspid regurgitation signal is inadequate for assessing PA pressure.  3. Left atrial size was moderately dilated.  4. The mitral valve is normal in structure. Mild to moderate mitral valve regurgitation.  5. The aortic valve is tricuspid. There is mild calcification of the aortic valve. There is mild thickening of the aortic valve. Aortic valve regurgitation is not visualized. Aortic valve sclerosis/calcification is present, without any evidence of aortic stenosis.  6. The inferior vena cava is dilated in size with <50% respiratory variability, suggesting right atrial pressure of 15 mmHg. Comparison(s): Prior images reviewed side by side. The left ventricular function is worsened. The left ventricular wall motion is new. FINDINGS  Left Ventricle: No thrombus is seen (Definity contrast was used). Left ventricular ejection fraction, by estimation, is 40 to 45%. The left ventricle has mildly decreased function. The left ventricle demonstrates regional wall motion abnormalities. Definity contrast agent was given IV to delineate the left ventricular endocardial borders. The left ventricular internal cavity size was normal in size. There is no left ventricular hypertrophy. Left ventricular diastolic parameters are consistent with Grade I diastolic dysfunction (impaired relaxation). Indeterminate filling pressures.  LV Wall  Scoring: The apex is dyskinetic. The entire inferior wall and posterior wall are hypokinetic. A very small area of the inferoapical "cap" appears to be frankly dyskinetic. Right Ventricle: The right ventricular size is normal. Right vetricular wall thickness was not well visualized. Right ventricular systolic function is normal. Tricuspid regurgitation signal is inadequate for assessing PA pressure. Left Atrium: Left atrial size was moderately dilated. Right Atrium: Right atrial size was normal in size. Pericardium: There is no evidence of pericardial effusion. Mitral Valve: The mitral valve is normal in structure. Mild to moderate mitral valve regurgitation, with centrally-directed jet. Tricuspid Valve: The tricuspid valve is normal in structure. Tricuspid valve regurgitation is not demonstrated. Aortic Valve: The aortic valve is tricuspid. There is mild calcification of the aortic valve. There is mild thickening of the aortic valve. Aortic valve regurgitation is not visualized. Aortic valve sclerosis/calcification is present, without any evidence of aortic stenosis. Aortic valve mean gradient  measures 6.0 mmHg. Aortic valve peak gradient measures 10.6 mmHg. Aortic valve area, by VTI measures 1.89 cm. Pulmonic Valve: The pulmonic valve was normal in structure. Pulmonic valve regurgitation is not visualized. Aorta: The aortic root is normal in size and structure. Venous: The inferior vena cava is dilated in size with less than 50% respiratory variability, suggesting right atrial pressure of 15 mmHg. IAS/Shunts: No atrial level shunt detected by color flow Doppler.  LEFT VENTRICLE PLAX 2D LVIDd:         5.50 cm   Diastology LVIDs:         4.30 cm   LV e' medial:    7.07 cm/s LV PW:         1.10 cm   LV E/e' medial:  13.0 LV IVS:        1.00 cm   LV e' lateral:   9.79 cm/s LVOT diam:     2.20 cm   LV E/e' lateral: 9.4 LV SV:         52 LV SV Index:   24 LVOT Area:     3.80 cm  RIGHT VENTRICLE RV Basal diam:  3.10 cm  RV Mid diam:    1.70 cm RV S prime:     12.90 cm/s TAPSE (M-mode): 2.1 cm LEFT ATRIUM             Index        RIGHT ATRIUM           Index LA diam:        4.70 cm 2.16 cm/m   RA Area:     14.50 cm LA Vol (A2C):   68.2 ml 31.38 ml/m  RA Volume:   31.10 ml  14.31 ml/m LA Vol (A4C):   44.0 ml 20.25 ml/m LA Biplane Vol: 54.3 ml 24.99 ml/m  AORTIC VALVE AV Area (Vmax):    1.91 cm AV Area (Vmean):   1.82 cm AV Area (VTI):     1.89 cm AV Vmax:           163.00 cm/s AV Vmean:          113.000 cm/s AV VTI:            0.275 m AV Peak Grad:      10.6 mmHg AV Mean Grad:      6.0 mmHg LVOT Vmax:         81.80 cm/s LVOT Vmean:        54.000 cm/s LVOT VTI:          0.137 m LVOT/AV VTI ratio: 0.50  AORTA Ao Root diam: 3.60 cm MITRAL VALVE MV Area (PHT): 4.29 cm     SHUNTS MV Decel Time: 177 msec     Systemic VTI:  0.14 m MV E velocity: 92.20 cm/s   Systemic Diam: 2.20 cm MV A velocity: 113.00 cm/s MV E/A ratio:  0.82 Mihai Croitoru MD Electronically signed by Thurmon Fair MD Signature Date/Time: 04/10/2023/6:09:56 PM    Final    DG CHEST PORT 1 VIEW  Result Date: 04/09/2023 CLINICAL DATA:  Fever EXAM: PORTABLE CHEST 1 VIEW COMPARISON:  Chest x-ray 04/08/2023 FINDINGS: Small right pleural effusion has mildly increased. The left lung is clear. There is no pneumothorax. The cardiomediastinal silhouette appears stable. The osseous structures are unchanged. IMPRESSION: Small right pleural effusion has mildly increased. Electronically Signed   By: Darliss Cheney M.D.   On: 04/09/2023 22:29     PHYSICAL EXAM  Temp:  [  90.7 F (32.6 C)-98.6 F (37 C)] 97.8 F (36.6 C) (11/13 0229) Pulse Rate:  [73-82] 82 (11/13 0957) Resp:  [18-20] 20 (11/13 0229) BP: (98-125)/(61-79) 121/79 (11/13 0957) SpO2:  [93 %-97 %] 97 % (11/13 0952)  General - Well nourished, well developed, in no apparent distress, mildly drowsy sleepy.  Ophthalmologic - fundi not visualized due to noncooperation.  Cardiovascular - Regular rhythm  and rate.  Neuro - mildly drowsy sleepy but easily arousable, eyes open on voice, orientated to age, place, time. No aphasia but mild dysarthria and paucity of speech, following all simple commands. Able to name and repeat. No gaze palsy, tracking bilaterally, visual field full. No significant facial droop. Tongue midline. Bilateral UEs 5/5, no drift but significant LUE ataxia. Bilaterally LEs 3/5 proximally, RLE 4/5 distally but LLE foot drop with ankle DF/PF 0/5. Sensation symmetrical bilaterally, gait not tested.     ASSESSMENT/PLAN Michael Rose is a 72 y.o. male with history of hypertension, hyperlipidemia, CAD and MI status post PCI, obesity admitted for shortness of breath and elevated troponin.  Status post cardiac cath on 04/12/2023 found to have severe CAD.  1 day after cath, patient found to have slurred speech, left upper extremity ataxia, left facial droop.  No tPA given due to outside window.    Stroke: Scattered post circulation infarcts, embolic pattern, could be related to cardiac cath procedure versus large vessel disease in the setting of procedure/hypotension CT no acute abnormality, old right BG infarct MRI showed right pontine infarct.  Small focus of acute infarction within the right cerebellar hemisphere.  2 small foci of acute infarction affecting the right posterior temporal lobe and right occipital lobe. 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 Carotid Doppler pending 2D Echo EF 40 to 45% LDL 51 HgbA1c 5.9 UDS pending Heparin IV for VTE prophylaxis aspirin 81 mg daily prior to admission, now on aspirin 81 mg daily and heparin IV.  Ongoing aggressive stroke risk factor management Therapy recommendations: CIR versus SNF Disposition: Pending  Non-STEMI Cardiomyopathy History of CAD/MI status post PCI Elevated troponin level Cardiac cath showed severe CAD EF 40 to 45% On metoprolol On aspirin and heparin IV, further antithrombotic regimen per  cardiology  Diabetes HgbA1c 5.9 goal < 7.0 Controlled Currently on insulin CBG monitoring SSI DM education and close PCP follow up  Hypertension Stable on the low end Avoid low BP On metoprolol Long term BP goal normotensive  Hyperlipidemia Home meds: Lipitor 10 LDL 51, goal < 70 Now on Lipitor 80 Continue statin at discharge  Other Stroke Risk Factors Advanced age Obesity, BMI 29.94  Other Active Problems Leukocytosis, WBC 11.2--12.2--15.4  Hospital day # 5    Marvel Plan, MD PhD Stroke Neurology 04/14/2023 1:39 PM    To contact Stroke Continuity provider, please refer to [email or web address]. After hours, contact General Neurology

## 2023-04-14 NOTE — Consult Note (Signed)
WOC Nurse Consult Note: Reason for Consult: Requested a assessment to a sacrum wound. Wound type: full thickness wound infected, painful with large amount of brown pus with odor. Measurement: .3x.3x2cm when swab inserted Secure chat was sent it by primary medical team.  - Patient has a infected wound is his sacrum, which will required a surgical consult and a CT scan. Abscess with pus draining brown and odor.  Dressing procedure/placement/frequency: Foam dressing in the sacrum, until further follow up of surgery.  [facility] Acquanetta Sit RN, BSN, WOC nurse  Cammie Mcgee WOC nurse

## 2023-04-14 NOTE — Progress Notes (Signed)
Physical Therapy Treatment Patient Details Name: Michael Rose MRN: [id number] DOB: [birth year] Today's Date: 04/14/2023   History of Present Illness Pt is 72 year old presented to [facility] on  04/09/23 from [facility] for likely viral gastroenteritis and encephalopathy in setting on profound hypokalemia. Pt with elevated troponin and unclear if NSTEMI vs demand ischemia. Cardiology consulted and underwent cardiac cath on 11/11. On 04/13/23 pt developed new onset of Lt facial droop, Lt UE ataxia, and slurred speech. 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    PT Comments  Pt admitted with above diagnosis. Pt was able to sit EOB x 10 min with supervision at EOB. Pt limited by pain buttocks and refused to try and stand today.  Did assist pt in changing linens.  Slow progress.  Pt currently with functional limitations due to the deficits listed below (see PT Problem List). Pt will benefit from acute skilled PT to increase their independence and safety with mobility to allow discharge.       If plan is discharge home, recommend the following: Two people to help with walking and/or transfers;Two people to help with bathing/dressing/bathroom;Assistance with cooking/housework;Assist for transportation;Supervision due to cognitive status;Help with stairs or ramp for entrance;Assistance with feeding   Can travel by private vehicle     No  Equipment Recommendations  None recommended by PT    Recommendations for Other Services Rehab consult (if pt progresses tolerance may be candidate new CVA)     Precautions / Restrictions Precautions Precautions: Fall Precaution Comments: very anxious Restrictions Weight Bearing Restrictions: No     Mobility  Bed Mobility Overal bed mobility: Needs Assistance Bed  Mobility: Rolling Rolling: Max assist, Used rails   Supine to sit: Mod assist, +2 for physical assistance Sit to supine: Min assist, +2 for safety/equipment        Transfers                   General transfer comment: deferred due to pain and fatigue    Ambulation/Gait                   Stairs             Wheelchair Mobility     Tilt Bed    Modified Rankin (Stroke Patients Only) Modified Rankin (Stroke Patients Only) Pre-Morbid Rankin Score: Moderately severe disability Modified Rankin: Severe disability     Balance Overall balance assessment: Needs assistance Sitting-balance support: No upper extremity supported, Feet supported, Bilateral upper extremity supported Sitting balance-Leahy Scale: Fair Sitting balance - Comments: Pt can sit wihtout UE and external support with CGA for about 10 min. Fatigues quickly.                                    Cognition Arousal: Alert Behavior During Therapy: Anxious, WFL for tasks assessed/performed Overall Cognitive Status: No family/caregiver present to determine baseline cognitive functioning Area of Impairment: Attention, Safety/judgement, Awareness, Problem solving, Following commands                   Current Attention Level: Sustained   Following Commands: Follows one step commands consistently, Follows one step commands with increased time Safety/Judgement: Decreased awareness of safety, Decreased awareness of  deficits Awareness: Emergent Problem Solving: Slow processing, Decreased initiation, Difficulty sequencing, Requires verbal cues General Comments: Pt with poor attention and anxious. follows one step commands consistently but does benefit from cues for problem solving and sequencing.        Exercises      General Comments General comments (skin integrity, edema, etc.): VSS      Pertinent Vitals/Pain Pain Assessment Pain Assessment: Faces Faces Pain Scale:  Hurts whole lot Pain Location: buttock at sacral wound Pain Descriptors / Indicators: Grimacing, Guarding, Sore Pain Intervention(s): Limited activity within patient's tolerance, Monitored during session, Repositioned    Home Living                          Prior Function            PT Goals (current goals can now be found in the care plan section) Acute Rehab PT Goals Patient Stated Goal: get better PT Goal Formulation: With patient Time For Goal Achievement: 04/24/23 Potential to Achieve Goals: Good Progress towards PT goals: Not progressing toward goals - comment (self limiting and slow progression; pt does have buttock wound)    Frequency    Min 1X/week      PT Plan      Co-evaluation              AM-PAC PT "6 Clicks" Mobility   Outcome Measure  Help needed turning from your back to your side while in a flat bed without using bedrails?: Total Help needed moving from lying on your back to sitting on the side of a flat bed without using bedrails?: Total Help needed moving to and from a bed to a chair (including a wheelchair)?: Total Help needed standing up from a chair using your arms (e.g., wheelchair or bedside chair)?: Total Help needed to walk in hospital room?: Total Help needed climbing 3-5 steps with a railing? : Total 6 Click Score: 6    End of Session Equipment Utilized During Treatment: Gait belt Activity Tolerance: Patient limited by pain;Patient limited by fatigue Patient left: in bed;with call bell/phone within reach;with bed alarm set Nurse Communication: Mobility status;Need for lift equipment PT Visit Diagnosis: Unsteadiness on feet (R26.81);Other abnormalities of gait and mobility (R26.89);Repeated falls (R29.6);Muscle weakness (generalized) (M62.81);Pain;Hemiplegia and hemiparesis Hemiplegia - Right/Left: Left Hemiplegia - dominant/non-dominant: Non-dominant Hemiplegia - caused by: Cerebral infarction Pain - Right/Left:  Left Pain - part of body:  (buttock/sacrum)     Time: 1610-9604 PT Time Calculation (min) (ACUTE ONLY): 13 min  Charges:    $Therapeutic Activity: 8-22 mins PT General Charges $$ ACUTE PT VISIT: 1 Visit                     Chabeli Barsamian M,PT Acute Rehab Services [phone]    Bevelyn Buckles 04/14/2023, 1:51 PM

## 2023-04-14 NOTE — Progress Notes (Signed)
Progress Note   Patient: Michael Rose:[id number] DOB: March 16, 1951 DOA: 04/09/2023     5 DOS: the patient was seen and examined on 04/14/2023   Brief hospital course: 72 y.o. male past medical history significant for essential hypertension diabetes mellitus type 2 CAD status post PCI, BPH comes in with shortness of breath that started on 04/08/2023, he has been having nonbilious vomiting for about a week prior to admission at [facility] twelve-lead EKG shows sinus tachycardia with diffuse ST segment depression troponins doubled with every repeat last 1 was greater than 3000 chest x-ray showed vascular congestion with right pleural effusion, white count of 17 was transferred to [facility]. 2D echo was done that showed an EF of 40% with regional wall motion abnormality, small inferoapical area which appeared to be frankly dyskinetic.   Heart cath revealed severe multivessel disease with CT surgery consulted for consideration for cabg. 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 Cardiology consulted left heart cath on 04/12/2023, showed severe left main disease 80 to 90%. CT surgery was consulted and following. Deciding on options while below CVA is being addressed by Neurology -Per Cardiology, need to consider options should pt not be a CABG candidate. For now, Cardiology recommends to remain off plavix until it is certain he will not have surgery  Acute R paramedian pons CVA, Acute R cerebellar hemisphere CVA, Acute R post temporal lobe and R occipital lobe CVA -new diagnosis this visit, MRI reviewed -Neurology following -MRI performed -F/u with stroke team. Pt is continued on heparin gtt for now -Per Cardiology, would not start plavix until it is certain pt will not have surgery   Leukocytosis/watery bowel movement: Has  remained afebrile No further diarrhea, Tmax was 100.2 respiratory panel was negative. Leukocytosis resolved.  Likely due to steroids   Essential hypertension: Continue metoprolol and Imdur.   Iron deficiency anemia: Will need follow-up with PCP as an outpatient. Patient not remember when was his last colonoscopy.   Hyperlipidemia Continue statins.   Type 2 diabetes mellitus without complication, without long-term current use of insulin (HCC): He received steroids single dose, which makes his blood glucose erratic. Increase long-acting insulin continue sliding scale CBGs before meals and at bedtime.     Obesity: Noted has been counseled.   Anxiety/depression: Continue trazodone and fluoxetine   Benign prostatic hyperplasia Cont home meds  Sacral non-pressure wound present on admit Wound / Incision (Open or Dehisced) 04/14/23 Non-pressure wound Sacrum Red draining full thickness (Active)  Date First Assessed/Time First Assessed: 04/14/23 1003   Wound Type: Non-pressure wound  Location: Sacrum  Wound Description (Comments): Red draining full thickness  Present on Admission: Yes    Assessments 04/13/2023  8:26 PM  Wound Length (cm) 0.3 cm  Wound Width (cm) 0.3 cm  Wound Depth (cm) 2 cm  Wound Volume (cm^3) 0.18 cm^3  Wound Surface Area (cm^2) 0.09 cm^2     No associated orders.      Subjective: Reports feeling somewhat stronger on the L today  Physical Exam: Vitals:   04/14/23 0952 04/14/23 0957 04/14/23 1300 04/14/23 1404  BP: 121/79 121/79 105/63   Pulse: 82 82 85   Resp:   18 18  Temp:   97.8 F (36.6 C) 97.8 F (36.6 C)  TempSrc:   Oral Oral  SpO2: 97%  96%   Weight:  Height:       General exam: Awake, laying in bed, in nad Respiratory system: Normal respiratory effort, no wheezing Cardiovascular system: regular rate, s1, s2 Gastrointestinal system: Soft, nondistended, positive BS Central nervous system: CN2-12 grossly intact, L facial droop, 4/5  strength on L Extremities: Perfused, no clubbing Skin: Normal skin turgor, no notable skin lesions seen Psychiatry: Mood normal // no visual hallucinations   Data Reviewed:  Labs reviewed: Na 130, K 3.0, Cr 1.11, WBC 15.4, Hgb 11.5  Family Communication: pt in room, family not at bedside  Disposition: Status is: Inpatient Remains inpatient appropriate because: severity of illness  Planned Discharge Destination:  unclear at this time     Author: Rickey Barbara, MD 04/14/2023 3:21 PM  For on call review [email or web address].

## 2023-04-14 NOTE — Progress Notes (Signed)
Carotid duplex has been completed.   Results can be found under chart review under CV PROC. 04/14/2023 6:29 PM Cesareo Vickrey RVT, RDMS

## 2023-04-14 NOTE — Progress Notes (Signed)
PHARMACY - ANTICOAGULATION CONSULT NOTE  Pharmacy Consult for IV heparin Indication: chest pain/ACS  Allergies  Allergen Reactions   Iodinated Contrast Media Hives and Itching   Penicillins Other (See Comments)    Unknown reaction in childhood    Patient Measurements: Height: 5\' 11"  (180.3 cm) Weight: 97.4 kg (214 lb 11.2 oz) IBW/kg (Calculated) : 75.3 Heparin Dosing Weight: 95.1 kg  Vital Signs: Temp: 97.8 F (36.6 C) (11/13 0229) Temp Source: Oral (11/13 0229) BP: 98/61 (11/13 0229) Pulse Rate: 73 (11/13 0229)  Labs: Recent Labs    04/12/23 0518 04/13/23 0556 04/14/23 0328  HGB 11.1* 11.7* 11.5*  HCT 34.3* 35.5* 35.3*  PLT 130* 162 195  HEPARINUNFRC 0.34 0.33 0.43  CREATININE 0.73  --   --     Estimated Creatinine Clearance: 99.3 mL/min (by C-G formula based on SCr of 0.73 mg/dL).   Medical History: Past Medical History:  Diagnosis Date   Allergic reaction to contrast dye    Bipolar affective disorder (HCC)    Coronary atherosclerosis of native coronary artery    BMS to RCA 2008, [facility], Dr. Katrinka Blazing   Diabetes mellitus without complication [facility])    GERD (gastroesophageal reflux disease)    Hard of hearing    Right ear   Hyperlipidemia    Hypertension    Myocardial infarction [facility])    IMI 2008   Osteoarthritis    Ringing in right ear     Medications:  Infusions:   heparin 1,950 Units/hr (04/14/23 0044)    Assessment: 72 yo male with NSTEMI transferred to [facility] with IV heparin running at 1130 units/hr. No AC PTA.  Pt s/p LHC with severe LM and LAD disease, heparin resumed, TCTS consulted for CABG. MRI with acute infarcts 11/12, heparin ok to continue per neuro. Heparin level therapeutic at 0.43, CBC stable.  Goal of Therapy:  Heparin level 0.3-0.5 units/ml Monitor platelets by anticoagulation protocol: Yes   Plan:  Continue heparin 1950 units/hr Daily heparin level and CBC   Fredonia Highland, PharmD, [place], [facility] Clinical  Pharmacist [phone] Please check AMION for all [facility] numbers 04/14/2023

## 2023-04-14 NOTE — Progress Notes (Signed)
Inpatient Rehab Admissions Coordinator:   Per therapy recommendations, patient was screened for CIR candidacy by Megan Salon, MS, CCC-SLP. At this time, Pt. is unable to tolerate the intensity of CIR; however,  Pt. may have potential to progress to becoming a potential CIR candidate, so CIR admissions team will follow and monitor for progress and participation with therapies and place consult order if Pt. appears to be an appropriate candidate. Please contact me with any questions.   Megan Salon, MS, CCC-SLP Rehab Admissions Coordinator  [phone] (celll) [phone] (office)

## 2023-04-14 NOTE — Progress Notes (Signed)
Rounding Note    Patient Name: Michael Rose Date of Encounter: 04/14/2023  [facility] Cardiologist: lives in [place], previously followed with Dr. Diona Browner  Subjective   Yesterday morning developed dysarthria and left sided weakness found to have acute right cerebellar infarction.  He denies chest pain  Inpatient Medications    Scheduled Meds:  aspirin EC  81 mg Oral Daily   atorvastatin  80 mg Oral Daily   diclofenac Sodium  2 g Topical QID   feeding supplement (GLUCERNA SHAKE)  237 mL Oral TID BM   fluvoxaMINE  200 mg Oral Daily   furosemide  40 mg Intravenous BID   insulin aspart  0-15 Units Subcutaneous TID WC   insulin aspart  0-5 Units Subcutaneous QHS   insulin aspart  4 Units Subcutaneous TID WC   insulin glargine-yfgn  10 Units Subcutaneous BID   melatonin  10 mg Oral QHS   metoprolol succinate  100 mg Oral Daily   pantoprazole  40 mg Oral Daily   potassium chloride  40 mEq Oral Q4H   QUEtiapine  25 mg Oral QHS   sodium chloride flush  3 mL Intravenous Q12H   traZODone  150 mg Oral QHS   Continuous Infusions:  heparin 1,950 Units/hr (04/14/23 0044)   PRN Meds: acetaminophen, HYDROcodone-acetaminophen, nitroGLYCERIN, ondansetron (ZOFRAN) IV, polyethylene glycol, sodium chloride flush   Vital Signs    Vitals:   04/13/23 1940 04/14/23 0229 04/14/23 0952 04/14/23 0957  BP: 107/67 98/61 121/79 121/79  Pulse: 80 73 82 82  Resp: 20 20    Temp: 98.6 F (37 C) 97.8 F (36.6 C)    TempSrc: Oral Oral    SpO2: 97% 93% 97%   Weight:      Height:        Intake/Output Summary (Last 24 hours) at 04/14/2023 1011 Last data filed at 04/14/2023 0327 Gross per 24 hour  Intake --  Output 2050 ml  Net -2050 ml      04/10/2023    5:33 AM 11/20/2022   11:20 AM 11/19/2022    4:20 PM  Last 3 Weights  Weight (lbs) 214 lb 11.2 oz 228 lb 13.4 oz 229 lb  Weight (kg) 97.387 kg 103.8 kg 103.874 kg      Telemetry    Sinus rhythm in the 80s - Personally  Reviewed  ECG    No new tracings - Personally Reviewed  Physical Exam   GEN: No acute distress.  Speech altered compared to yesterday Neck: No JVD Cardiac: RRR, no murmurs, rubs, or gallops.  Respiratory: Clear to auscultation bilaterally. GI: Soft, nontender, non-distended  MS: No edema; does not move left leg (chronic), moving other three extremities Neuro:  Nonfocal  Psych: Normal affect   Labs    High Sensitivity Troponin:  No results for input(s): "TROPONINIHS" in the last 720 hours.   Chemistry Recent Labs  Lab 04/09/23 1747 04/10/23 0702 04/12/23 0518 04/14/23 0817  NA 141 136 135 139  K 3.5 3.4* 3.1* 3.0*  CL 102 103 102 104  CO2 27 23 24 28   GLUCOSE 111* 128* 153* 130*  BUN 24* 26* 16 22  CREATININE 1.03 0.97 0.73 1.11  CALCIUM 8.6* 8.2* 8.3* 8.2*  MG 2.1  --   --   --   PROT  --   --   --  5.7*  ALBUMIN  --   --   --  2.4*  AST  --   --   --  27  ALT  --   --   --  28  ALKPHOS  --   --   --  69  BILITOT  --   --   --  0.9  GFRNONAA >60 >60 >60 >60  ANIONGAP 12 10 9 7     Lipids  Recent Labs  Lab 04/10/23 0702  CHOL 111  TRIG 94  HDL 41  LDLCALC 51  CHOLHDL 2.7    Hematology Recent Labs  Lab 04/12/23 0518 04/13/23 0556 04/14/23 0328  WBC 11.2* 12.2* 15.4*  RBC 4.54 4.76 4.65  HGB 11.1* 11.7* 11.5*  HCT 34.3* 35.5* 35.3*  MCV 75.6* 74.6* 75.9*  MCH 24.4* 24.6* 24.7*  MCHC 32.4 33.0 32.6  RDW 16.6* 16.3* 16.2*  PLT 130* 162 195   Thyroid No results for input(s): "TSH", "FREET4" in the last 168 hours.  BNPNo results for input(s): "BNP", "PROBNP" in the last 168 hours.  DDimer No results for input(s): "DDIMER" in the last 168 hours.   Radiology    MR BRAIN WO CONTRAST  Result Date: 04/13/2023 CLINICAL DATA:  Mental status change, unknown cause. Slurred speech beginning today. EXAM: MRI HEAD WITHOUT CONTRAST TECHNIQUE: Multiplanar, multiecho pulse sequences of the brain and surrounding structures were obtained without intravenous  contrast. COMPARISON:  Head CT 04/08/2023 FINDINGS: Brain: Diffusion imaging shows acute infarction affecting the right para median pons. Small focus of acute infarction within the right cerebellar hemisphere. Two small foci of acute infarction affecting the right posterior temporal lobe and right occipital lobe. No other acute finding. Extensive chronic small-vessel ischemic changes seen otherwise throughout the cerebral hemispheric white matter. Old punctate foci of hemosiderin deposition associated with some of the old small vessel infarctions. Old basal ganglia infarction on the right with hemosiderin deposition. Ex vacuo enlargement of the frontal horn of the right lateral ventricle. No evidence of obstructive hydrocephalus. Chronic encephalomalacia the inferior frontal lobes, more extensive on the left than the right, the pattern most consistent with distant closed head injury. No mass lesion. No extra-axial collection. Vascular: Major vessels at the base of the brain show flow. This includes flow within both vertebral arteries and the basilar artery. The left vertebral artery is dominant, but the small non dominant right vertebral artery does appear to show flow. Skull and upper cervical spine: Negative Sinuses/Orbits: Clear/normal Other: None IMPRESSION: 1. Acute infarction affecting the right para median pons. Small focus of acute infarction within the right cerebellar hemisphere. Two small foci of acute infarction affecting the right posterior temporal lobe and right occipital lobe. Findings all consistent with posterior circulation insult. 2. Extensive chronic small-vessel ischemic changes otherwise throughout the cerebral hemispheric white matter. Old right basal ganglia infarction with hemosiderin deposition. 3. 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 Electronically Signed   By: Paulina Fusi M.D.   On: 04/13/2023 11:37   CARDIAC  CATHETERIZATION  Result Date: 04/12/2023 Severe left main disease with diffuse 80-90% calcified stenosis Severe mid-LAD stenosis of 80% Critical ostial RCA stenosis with a 95-99% ulcerated plaque Nonobstructive LCx stenosis Mild-moderate segmental LV dysfunction with severely elevated LVEDP Recommend: TCTS consult for consideration of CABG. Resume heparin. Diuresis as tolerated.    Cardiac Studies   Left heart cath 04/12/23: Severe left main disease with diffuse 80-90% calcified stenosis Severe mid-LAD stenosis of 80% Critical ostial RCA stenosis with a 95-99% ulcerated plaque Nonobstructive LCx stenosis Mild-moderate segmental LV dysfunction with severely elevated  LVEDP   Recommend: TCTS consult for consideration of CABG. Resume heparin. Diuresis as tolerated.    Echo 04/10/23:  1. No thrombus is seen (Definity contrast was used). Left ventricular  ejection fraction, by estimation, is 40 to 45%. The left ventricle has  mildly decreased function. The left ventricle demonstrates regional wall  motion abnormalities (see scoring  diagram/findings for description). Left ventricular diastolic parameters  are consistent with Grade I diastolic dysfunction (impaired relaxation). A  very small area of the inferoapical "cap" appears to be frankly  dyskinetic.   2. Right ventricular systolic function is normal. The right ventricular  size is normal. Tricuspid regurgitation signal is inadequate for assessing  PA pressure.   3. Left atrial size was moderately dilated.   4. The mitral valve is normal in structure. Mild to moderate mitral valve  regurgitation.   5. The aortic valve is tricuspid. There is mild calcification of the  aortic valve. There is mild thickening of the aortic valve. Aortic valve  regurgitation is not visualized. Aortic valve sclerosis/calcification is  present, without any evidence of  aortic stenosis.   6. The inferior vena cava is dilated in size with <50% respiratory   variability, suggesting right atrial pressure of 15 mmHg.    Patient Profile     72 y.o. male with a history of CAD with MI in 2008 s/p BMS to RCA, hypertension, hyperlipidemia, type 2 diabetes mellitus, GERD, anxiety/ depression, and bipolar affective disorder who is being seen 04/09/2023 for the evaluation of NSTEMI.   Assessment & Plan    NSTEMI CAD Heart catheterization yesterday with 90% left main disease followed by 50% and 80% lesions in the LAD as well as 99% stenosis in the ostial-proximal RCA  - given left main and critical RCA stenosis, CT surgery was consulted - candidacy for surgery questionable given mobility (chronically unable to move left leg and uses a wheelchair, does not leave the house) - Unfortunately patient had acute neurological changes yesterday morning with brain MRI consistent with acute CVA -Treated with aspirin, Imdur, 100 mg Toprol, 80 mg Lipitor   Acute CVA - Brain MR consistent with acute infarction in the right median pons, acute infarction in the right cerebellar hemisphere, 2 areas in the right posterior temporal lobe and right occipital lobe-all consistent with posterior circulation insult -Appreciate neurology input - He remains on heparin -He chronically is unable to move his left leg, at approximately 10:25 AM yesterday patient began having L UE weakness, ataxia, left facial droop, left tongue deviation. - Differential includes periprocedural stroke versus cardioembolic etiology -- has not been treated with DAPT given question of upcoming CABG   Acute systolic heart failure - LVEF 57-84% - LVEDP at the time of cath 34 mmHg - diuresing on 40 mg IV lasix BID - 2L urine output - volume status difficult on exam - albumin 2.4 - sCr 1.11 (0.73) - will hold afternoon dose of lasix and monitor   Hyperlipidemia with LDL goal less than 70 Increase Lipitor to 80 mg 04/10/2023: Cholesterol 111; HDL 41; LDL Cholesterol 51; Triglycerides 94; VLDL 19 -  LP(a) 37.9        For questions or updates, please contact [facility] Please consult [email or web address] for contact info under        Signed, Marcelino Duster, PA  04/14/2023, 10:11 AM

## 2023-04-14 NOTE — Consult Note (Addendum)
[facility] Surgery Consult Note  Michael Rose [birth year]  [id number].    Requesting MD: Rickey Barbara Chief Complaint/Reason for Consult: sacral wound  HPI:  Michael Rose is a 72 y.o. male PMH HTN, DM, HLD, CAD s/p PCI 2010 who was admitted to [facility] 04/08/23 after presenting with SOB. He was found to have NSTEMI and underwent cardiac cath 04/12/23 which showed severe multivessel disease. TCTS has seen for consideration of CABG. Hospital course complicated by acute stroke diagnosed yesterday 04/13/23. He is currently on heparin gtt and daily ASA 81mg .  Today he was noted to have drainage from a wound over his sacrum. WOC has seen and recommended surgery consult. Patient states that this wound appeared about 1 week ago. He has never had a wound in this area. Prior to admission he mobilized mostly with wheelchair, but did also use a walker at times.    Family History  Problem Relation Age of Onset   Heart disease Mother    Heart disease Father     Past Medical History:  Diagnosis Date   Allergic reaction to contrast dye    Bipolar affective disorder (HCC)    Coronary atherosclerosis of native coronary artery    BMS to RCA 2008, [facility], Dr. Katrinka Blazing   Diabetes mellitus without complication (HCC)    GERD (gastroesophageal reflux disease)    Hard of hearing    Right ear   Hyperlipidemia    Hypertension    Myocardial infarction [facility])    IMI 2008   Osteoarthritis    Ringing in right ear     Past Surgical History:  Procedure Laterality Date   CHOLECYSTECTOMY  2013   JOINT REPLACEMENT     LEFT HEART CATH AND CORONARY ANGIOGRAPHY N/A 04/12/2023   Procedure: LEFT HEART CATH AND CORONARY ANGIOGRAPHY;  Surgeon: [name], MD;  Location: [facility] INVASIVE CV LAB;  Service: Cardiovascular;  Laterality: N/A;   ORIF FEMUR FRACTURE Right 11/20/2022   Procedure: OPEN REDUCTION INTERNAL FIXATION (ORIF) DISTAL FEMUR FRACTURE;  Surgeon: Roby Lofts, MD;  Location: [facility] OR;   Service: Orthopedics;  Laterality: Right;   TOTAL KNEE ARTHROPLASTY     Left   TOTAL KNEE ARTHROPLASTY Right 10/21/2012   Dr Madelon Lips   TOTAL KNEE ARTHROPLASTY Right 10/21/2012   Procedure: TOTAL KNEE ARTHROPLASTY;  Surgeon: Thera Flake., MD;  Location: [facility] OR;  Service: Orthopedics;  Laterality: Right;   VASECTOMY      Social History:  reports that he has never smoked. He has never used smokeless tobacco. He reports that he does not drink alcohol and does not use drugs.  Allergies:  Allergies  Allergen Reactions   Iodinated Contrast Media Hives and Itching   Penicillins Other (See Comments)    Unknown reaction in childhood    Medications Prior to Admission  Medication Sig Dispense Refill   aspirin 81 MG tablet Take 81 mg by mouth daily.     atorvastatin (LIPITOR) 10 MG tablet Take 10 mg by mouth daily.     empagliflozin (JARDIANCE) 25 MG TABS tablet Take 25 mg by mouth daily.     fluvoxaMINE (LUVOX) 100 MG tablet Take 200 mg by mouth daily.     furosemide (LASIX) 40 MG tablet Take 40 mg by mouth daily.     HYDROcodone-acetaminophen (NORCO) 7.5-325 MG tablet Take 1 tablet by mouth every 6 (six) hours as needed for moderate pain (pain score 4-6) or severe pain (pain score 7-10).  IBU 800 MG tablet Take 800 mg by mouth every 8 (eight) hours as needed for headache or moderate pain.     Insulin Degludec (TRESIBA) 100 UNIT/ML SOLN Inject 14 Units into the skin at bedtime.     isosorbide mononitrate (IMDUR) 30 MG 24 hr tablet TAKE 1 TABLET BY MOUTH EVERY DAY (Patient taking differently: Take 30 mg by mouth daily.) 30 tablet 6   levocetirizine (XYZAL) 5 MG tablet Take 5 mg by mouth daily.     methocarbamol (ROBAXIN) 500 MG tablet Take 1 tablet (500 mg total) by mouth every 6 (six) hours as needed for muscle spasms. 20 tablet 0   metoprolol succinate (TOPROL-XL) 50 MG 24 hr tablet TAKE 1 TABLET BY MOUTH EVERY MORNING AND TAKE 1/2 TABLET EVERY EVENING - NEEDS APPOINTMENT (Patient taking  differently: Take 50 mg by mouth daily.) 15 tablet 0   NOVOLIN N 100 UNIT/ML injection Inject 10-35 Units into the skin See admin instructions. Inject 10 units in the morning, 35 units with lunch, 10 units at bedtime.     OZEMPIC, 1 MG/DOSE, 4 MG/3ML SOPN Inject 1 mg into the skin every Monday.     pantoprazole (PROTONIX) 40 MG tablet Take 40 mg by mouth daily.     polyethylene glycol (MIRALAX / GLYCOLAX) 17 g packet Take 17 g by mouth 2 (two) times daily. (Patient taking differently: Take 17 g by mouth daily as needed for mild constipation, moderate constipation or severe constipation.) 14 each 0   traZODone (DESYREL) 150 MG tablet Take 150 mg by mouth at bedtime.      Prior to Admission medications   Medication Sig Start Date End Date Taking? Authorizing Provider  aspirin 81 MG tablet Take 81 mg by mouth daily.   Yes [provider]  atorvastatin (LIPITOR) 10 MG tablet Take 10 mg by mouth daily.   Yes [provider]  empagliflozin (JARDIANCE) 25 MG TABS tablet Take 25 mg by mouth daily.   Yes [provider]  fluvoxaMINE (LUVOX) 100 MG tablet Take 200 mg by mouth daily.   Yes [provider]  furosemide (LASIX) 40 MG tablet Take 40 mg by mouth daily. 02/17/23  Yes [provider]  HYDROcodone-acetaminophen (NORCO) 7.5-325 MG tablet Take 1 tablet by mouth every 6 (six) hours as needed for moderate pain (pain score 4-6) or severe pain (pain score 7-10).   Yes [provider]  IBU 800 MG tablet Take 800 mg by mouth every 8 (eight) hours as needed for headache or moderate pain. 10/30/22  Yes [provider]  Insulin Degludec (TRESIBA) 100 UNIT/ML SOLN Inject 14 Units into the skin at bedtime.   Yes [provider]  isosorbide mononitrate (IMDUR) 30 MG 24 hr tablet TAKE 1 TABLET BY MOUTH EVERY DAY Patient taking differently: Take 30 mg by mouth daily. 07/17/14  Yes Jonelle Sidle, MD  levocetirizine (XYZAL) 5 MG tablet Take 5  mg by mouth daily. 10/26/22  Yes [provider]  methocarbamol (ROBAXIN) 500 MG tablet Take 1 tablet (500 mg total) by mouth every 6 (six) hours as needed for muscle spasms. 11/23/22  Yes McClung, Sarah A, PA-C  metoprolol succinate (TOPROL-XL) 50 MG 24 hr tablet TAKE 1 TABLET BY MOUTH EVERY MORNING AND TAKE 1/2 TABLET EVERY EVENING - NEEDS APPOINTMENT Patient taking differently: Take 50 mg by mouth daily. 03/07/19  Yes Jonelle Sidle, MD  NOVOLIN N 100 UNIT/ML injection Inject 10-35 Units into the skin See admin instructions.  Inject 10 units in the morning, 35 units with lunch, 10 units at bedtime. 10/13/22  Yes [provider]  OZEMPIC, 1 MG/DOSE, 4 MG/3ML SOPN Inject 1 mg into the skin every Monday. 10/22/22  Yes [provider]  pantoprazole (PROTONIX) 40 MG tablet Take 40 mg by mouth daily.   Yes [provider]  polyethylene glycol (MIRALAX / GLYCOLAX) 17 g packet Take 17 g by mouth 2 (two) times daily. Patient taking differently: Take 17 g by mouth daily as needed for mild constipation, moderate constipation or severe constipation. 11/24/22  Yes Rodolph Bong, MD  traZODone (DESYREL) 150 MG tablet Take 150 mg by mouth at bedtime.   Yes [provider]    Blood pressure 121/79, pulse 82, temperature 97.8 F (36.6 C), temperature source Oral, resp. rate 20, height 5\' 11"  (1.803 m), weight 97.4 kg, SpO2 97%. Physical Exam: General: chronically ill appearing male who is laying in bed in NAD HEENT: head is normocephalic, atraumatic.  Sclera are noninjected.  Pupils equal and round.  Ears and nose without any masses or lesions.  Mouth is pink and moist. Dentition fair Heart: regular, rate, and rhythm Lungs: CTAB, no wheezes, rhonchi, or rales noted.  Respiratory effort nonlabored on room air Abd: soft, NT/ND Psych: A&O with an appropriate affect Neuro: weakness noted to LUE and LLE Skin: 0.5x0.5x0.5cm opening over sacrum with purulent drainage,  there is about 2cm tunneling distally. No significant overlying cellulitis, fluctuance, or induration. No crepitance     Results for orders placed or performed during the hospital encounter of 04/09/23 (from the past 48 hour(s))  Glucose, capillary     Status: Abnormal   Collection Time: 04/12/23  3:51 PM  Result Value Ref Range   Glucose-Capillary 138 (H) 70 - 99 mg/dL    Comment: Glucose reference range applies only to samples taken after fasting for at least 8 hours.  Glucose, capillary     Status: Abnormal   Collection Time: 04/12/23  9:20 PM  Result Value Ref Range   Glucose-Capillary 200 (H) 70 - 99 mg/dL    Comment: Glucose reference range applies only to samples taken after fasting for at least 8 hours.  CBC     Status: Abnormal   Collection Time: 04/13/23  5:56 AM  Result Value Ref Range   WBC 12.2 (H) 4.0 - 10.5 K/uL   RBC 4.76 4.22 - 5.81 MIL/uL   Hemoglobin 11.7 (L) 13.0 - 17.0 g/dL   HCT 01.0 (L) 27.2 - 53.6 %   MCV 74.6 (L) 80.0 - 100.0 fL   MCH 24.6 (L) 26.0 - 34.0 pg   MCHC 33.0 30.0 - 36.0 g/dL   RDW 64.4 (H) 03.4 - 74.2 %   Platelets 162 150 - 400 K/uL   nRBC 0.0 0.0 - 0.2 %    Comment: Performed at [facility], [address], [place], [place] [address]  Heparin level (unfractionated)     Status: None   Collection Time: 04/13/23  5:56 AM  Result Value Ref Range   Heparin Unfractionated 0.33 0.30 - 0.70 IU/mL    Comment: (NOTE) The clinical reportable range upper limit is being lowered to >1.10 to align with the FDA approved guidance for the current laboratory assay.  If heparin results are below expected values, and patient dosage has  been confirmed, suggest follow up testing of antithrombin III levels. Performed at [facility], [address], [place], [place] [address]   Glucose,  capillary     Status: Abnormal   Collection Time: 04/13/23  7:50 AM  Result Value Ref Range   Glucose-Capillary 232 (H) 70 - 99 mg/dL    Comment: Glucose  reference range applies only to samples taken after fasting for at least 8 hours.  Glucose, capillary     Status: Abnormal   Collection Time: 04/13/23 11:51 AM  Result Value Ref Range   Glucose-Capillary 189 (H) 70 - 99 mg/dL    Comment: Glucose reference range applies only to samples taken after fasting for at least 8 hours.  Glucose, capillary     Status: Abnormal   Collection Time: 04/13/23  3:56 PM  Result Value Ref Range   Glucose-Capillary 152 (H) 70 - 99 mg/dL    Comment: Glucose reference range applies only to samples taken after fasting for at least 8 hours.  Glucose, capillary     Status: Abnormal   Collection Time: 04/13/23  8:59 PM  Result Value Ref Range   Glucose-Capillary 189 (H) 70 - 99 mg/dL    Comment: Glucose reference range applies only to samples taken after fasting for at least 8 hours.  CBC     Status: Abnormal   Collection Time: 04/14/23  3:28 AM  Result Value Ref Range   WBC 15.4 (H) 4.0 - 10.5 K/uL   RBC 4.65 4.22 - 5.81 MIL/uL   Hemoglobin 11.5 (L) 13.0 - 17.0 g/dL   HCT 16.1 (L) 09.6 - 04.5 %   MCV 75.9 (L) 80.0 - 100.0 fL   MCH 24.7 (L) 26.0 - 34.0 pg   MCHC 32.6 30.0 - 36.0 g/dL   RDW 40.9 (H) 81.1 - 91.4 %   Platelets 195 150 - 400 K/uL   nRBC 0.0 0.0 - 0.2 %    Comment: Performed at [facility], [address], [place], [place] [address]  Heparin level (unfractionated)     Status: None   Collection Time: 04/14/23  3:28 AM  Result Value Ref Range   Heparin Unfractionated 0.43 0.30 - 0.70 IU/mL    Comment: (NOTE) The clinical reportable range upper limit is being lowered to >1.10 to align with the FDA approved guidance for the current laboratory assay.  If heparin results are below expected values, and patient dosage has  been confirmed, suggest follow up testing of antithrombin III levels. Performed at [facility], [address], [place], [place] [address]   Comprehensive metabolic panel     Status: Abnormal   Collection Time:  04/14/23  8:17 AM  Result Value Ref Range   Sodium 139 135 - 145 mmol/L   Potassium 3.0 (L) 3.5 - 5.1 mmol/L   Chloride 104 98 - 111 mmol/L   CO2 28 22 - 32 mmol/L   Glucose, Bld 130 (H) 70 - 99 mg/dL    Comment: Glucose reference range applies only to samples taken after fasting for at least 8 hours.   BUN 22 8 - 23 mg/dL   Creatinine, Ser 8.65 0.61 - 1.24 mg/dL   Calcium 8.2 (L) 8.9 - 10.3 mg/dL   Total Protein 5.7 (L) 6.5 - 8.1 g/dL   Albumin 2.4 (L) 3.5 - 5.0 g/dL   AST 27 15 - 41 U/L   ALT 28 0 - 44 U/L   Alkaline Phosphatase 69 38 - 126 U/L   Total Bilirubin 0.9 <1.2 mg/dL   GFR, Estimated >78 >46 mL/min    Comment: (NOTE) Calculated using the CKD-EPI Creatinine Equation (  2021)    Anion gap 7 5 - 15    Comment: Performed at [facility], [address] [place], [place] [address]  Glucose, capillary     Status: Abnormal   Collection Time: 04/14/23  8:26 AM  Result Value Ref Range   Glucose-Capillary 124 (H) 70 - 99 mg/dL    Comment: Glucose reference range applies only to samples taken after fasting for at least 8 hours.  Glucose, capillary     Status: Abnormal   Collection Time: 04/14/23 12:07 PM  Result Value Ref Range   Glucose-Capillary 154 (H) 70 - 99 mg/dL    Comment: Glucose reference range applies only to samples taken after fasting for at least 8 hours.   MR ANGIO HEAD WO CONTRAST  Result Date: 04/14/2023 CLINICAL DATA:  Provided history: Stroke/TIA, determine embolic source. Altered mental status. Slurred speech. EXAM: MRA HEAD WITHOUT CONTRAST TECHNIQUE: Angiographic images of the Circle of Willis were acquired using MRA technique without intravenous contrast. COMPARISON:  Brain MRI 04/13/2023. FINDINGS: Anterior circulation: The intracranial internal carotid arteries are patent. The M1 middle cerebral arteries are patent. No M2 proximal branch occlusion or high-grade proximal stenosis. The anterior cerebral arteries are patent. 1-2 mm vascular protrusion  arising from the proximal cavernous right ICA, which may reflect an aneurysm or the origin of an otherwise poorly delineated branch vessel (series 352, image 11). Posterior circulation: The intracranial vertebral arteries are patent. The right vertebral artery is developmentally diminutive beyond the PICA origin. The basilar artery is patent. The posterior cerebral arteries are patent. Severe stenosis within the right PCA at the P1/P2 junction. Posterior communicating arteries are present bilaterally. Anatomic variants: As described. IMPRESSION: 1. No intracranial proximal large vessel occlusion identified. 2. Severe stenosis within the right posterior cerebral artery at the P1/P2 junction. 3. 1-2 mm vascular protrusion arising from the proximal cavernous right internal carotid artery, which may reflect an aneurysm or the origin of an otherwise poorly delineated branch vessel. Electronically Signed   By: Jackey Loge D.O.   On: 04/14/2023 11:31   MR BRAIN WO CONTRAST  Result Date: 04/13/2023 CLINICAL DATA:  Mental status change, unknown cause. Slurred speech beginning today. EXAM: MRI HEAD WITHOUT CONTRAST TECHNIQUE: Multiplanar, multiecho pulse sequences of the brain and surrounding structures were obtained without intravenous contrast. COMPARISON:  Head CT 04/08/2023 FINDINGS: Brain: Diffusion imaging shows acute infarction affecting the right para median pons. Small focus of acute infarction within the right cerebellar hemisphere. Two small foci of acute infarction affecting the right posterior temporal lobe and right occipital lobe. No other acute finding. Extensive chronic small-vessel ischemic changes seen otherwise throughout the cerebral hemispheric white matter. Old punctate foci of hemosiderin deposition associated with some of the old small vessel infarctions. Old basal ganglia infarction on the right with hemosiderin deposition. Ex vacuo enlargement of the frontal horn of the right lateral  ventricle. No evidence of obstructive hydrocephalus. Chronic encephalomalacia the inferior frontal lobes, more extensive on the left than the right, the pattern most consistent with distant closed head injury. No mass lesion. No extra-axial collection. Vascular: Major vessels at the base of the brain show flow. This includes flow within both vertebral arteries and the basilar artery. The left vertebral artery is dominant, but the small non dominant right vertebral artery does appear to show flow. Skull and upper cervical spine: Negative Sinuses/Orbits: Clear/normal Other: None IMPRESSION: 1. Acute infarction affecting the right para median pons. Small focus of acute infarction within the right  cerebellar hemisphere. Two small foci of acute infarction affecting the right posterior temporal lobe and right occipital lobe. Findings all consistent with posterior circulation insult. 2. Extensive chronic small-vessel ischemic changes otherwise throughout the cerebral hemispheric white matter. Old right basal ganglia infarction with hemosiderin deposition. 3. 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 Electronically Signed   By: Paulina Fusi M.D.   On: 04/13/2023 11:37   CARDIAC CATHETERIZATION  Result Date: 04/12/2023 Severe left main disease with diffuse 80-90% calcified stenosis Severe mid-LAD stenosis of 80% Critical ostial RCA stenosis with a 95-99% ulcerated plaque Nonobstructive LCx stenosis Mild-moderate segmental LV dysfunction with severely elevated LVEDP Recommend: TCTS consult for consideration of CABG. Resume heparin. Diuresis as tolerated.    Anti-infectives (From admission, onward)    None        Assessment/Plan Sacral wound with abscess - Patient with small open wound over sacrum with purulent drainage. There is no significant overlying cellulitis, fluctuance or induration. This is draining spontaneously. Would not  recommend any surgical intervention. Recommend local wound care and antibiotics.   ID - none VTE - heparin gtt FEN - CM diet Foley - none  NSTEMI CAD, multivessel disease, hx PCI 2010 Acute stroke HTN DM HLD  I reviewed Consultant neurology and TCTS notes, hospitalist notes, last 24 h vitals and pain scores, last 48 h intake and output, and last 24 h labs and trends.   Franne Forts, PA-C [facility] 04/14/2023, 1:31 PM Please see Amion for pager number during day hours 7:00am-4:30pm

## 2023-04-14 NOTE — Plan of Care (Signed)

## 2023-04-14 NOTE — Hospital Course (Signed)
72 y.o. male past medical history significant for essential hypertension diabetes mellitus type 2 CAD status post PCI, BPH comes in with shortness of breath that started on 04/08/2023, he has been having nonbilious vomiting for about a week prior to admission at [facility] twelve-lead EKG shows sinus tachycardia with diffuse ST segment depression troponins doubled with every repeat last 1 was greater than 3000 chest x-ray showed vascular congestion with right pleural effusion, white count of 17 was transferred to [facility]. 2D echo was done that showed an EF of 40% with regional wall motion abnormality, small inferoapical area which appeared to be frankly dyskinetic.   Heart cath revealed severe multivessel disease with CT surgery consulted for consideration for cabg. During this time, pt ws found to have a posterior circulation stroke

## 2023-04-14 NOTE — Plan of Care (Signed)
  Problem: Education: Goal: Knowledge of General Education information will improve Description: Including pain rating scale, medication(s)/side effects and non-pharmacologic comfort measures Outcome: Progressing   Problem: Health Behavior/Discharge Planning: Goal: Ability to manage health-related needs will improve Outcome: Progressing   Problem: Clinical Measurements: Goal: Ability to maintain clinical measurements within normal limits will improve Outcome: Progressing Goal: Will remain free from infection Outcome: Progressing Goal: Diagnostic test results will improve Outcome: Progressing Goal: Respiratory complications will improve Outcome: Progressing Goal: Cardiovascular complication will be avoided Outcome: Progressing   Problem: Activity: Goal: Risk for activity intolerance will decrease Outcome: Progressing   Problem: Nutrition: Goal: Adequate nutrition will be maintained Outcome: Progressing   Problem: Coping: Goal: Level of anxiety will decrease Outcome: Progressing   Problem: Elimination: Goal: Will not experience complications related to bowel motility Outcome: Progressing Goal: Will not experience complications related to urinary retention Outcome: Progressing   Problem: Pain Management: Goal: General experience of comfort will improve Outcome: Progressing   Problem: Safety: Goal: Ability to remain free from injury will improve Outcome: Progressing   Problem: Skin Integrity: Goal: Risk for impaired skin integrity will decrease Outcome: Progressing   Problem: Education: Goal: Understanding of cardiac disease, CV risk reduction, and recovery process will improve Outcome: Progressing Goal: Individualized Educational Video(s) Outcome: Progressing   Problem: Activity: Goal: Ability to tolerate increased activity will improve Outcome: Progressing   Problem: Cardiac: Goal: Ability to achieve and maintain adequate cardiovascular perfusion will  improve Outcome: Progressing   Problem: Health Behavior/Discharge Planning: Goal: Ability to safely manage health-related needs after discharge will improve Outcome: Progressing   Problem: Education: Goal: Ability to describe self-care measures that may prevent or decrease complications (Diabetes Survival Skills Education) will improve Outcome: Progressing Goal: Individualized Educational Video(s) Outcome: Progressing   Problem: Coping: Goal: Ability to adjust to condition or change in health will improve Outcome: Progressing   Problem: Fluid Volume: Goal: Ability to maintain a balanced intake and output will improve Outcome: Progressing   Problem: Health Behavior/Discharge Planning: Goal: Ability to identify and utilize available resources and services will improve Outcome: Progressing Goal: Ability to manage health-related needs will improve Outcome: Progressing   Problem: Metabolic: Goal: Ability to maintain appropriate glucose levels will improve Outcome: Progressing   Problem: Nutritional: Goal: Maintenance of adequate nutrition will improve Outcome: Progressing Goal: Progress toward achieving an optimal weight will improve Outcome: Progressing   Problem: Skin Integrity: Goal: Risk for impaired skin integrity will decrease Outcome: Progressing   Problem: Tissue Perfusion: Goal: Adequacy of tissue perfusion will improve Outcome: Progressing   Problem: Education: Goal: Understanding of CV disease, CV risk reduction, and recovery process will improve Outcome: Progressing Goal: Individualized Educational Video(s) Outcome: Progressing   Problem: Activity: Goal: Ability to return to baseline activity level will improve Outcome: Progressing   Problem: Cardiovascular: Goal: Ability to achieve and maintain adequate cardiovascular perfusion will improve Outcome: Progressing Goal: Vascular access site(s) Level 0-1 will be maintained Outcome: Progressing    Problem: Health Behavior/Discharge Planning: Goal: Ability to safely manage health-related needs after discharge will improve Outcome: Progressing

## 2023-04-15 DIAGNOSIS — I2510 Atherosclerotic heart disease of native coronary artery without angina pectoris: Secondary | ICD-10-CM | POA: Diagnosis not present

## 2023-04-15 DIAGNOSIS — I214 Non-ST elevation (NSTEMI) myocardial infarction: Secondary | ICD-10-CM | POA: Diagnosis not present

## 2023-04-15 DIAGNOSIS — I63539 Cerebral infarction due to unspecified occlusion or stenosis of unspecified posterior cerebral artery: Secondary | ICD-10-CM | POA: Diagnosis not present

## 2023-04-15 DIAGNOSIS — I6340 Cerebral infarction due to embolism of unspecified cerebral artery: Secondary | ICD-10-CM

## 2023-04-15 LAB — CBC
HCT: 39.7 % (ref 39.0–52.0)
Hemoglobin: 12.3 g/dL — ABNORMAL LOW (ref 13.0–17.0)
MCH: 24.7 pg — ABNORMAL LOW (ref 26.0–34.0)
MCHC: 31 g/dL (ref 30.0–36.0)
MCV: 79.9 fL — ABNORMAL LOW (ref 80.0–100.0)
Platelets: 188 10*3/uL (ref 150–400)
RBC: 4.97 MIL/uL (ref 4.22–5.81)
RDW: 16.5 % — ABNORMAL HIGH (ref 11.5–15.5)
WBC: 10.3 10*3/uL (ref 4.0–10.5)
nRBC: 0 % (ref 0.0–0.2)

## 2023-04-15 LAB — BASIC METABOLIC PANEL
Anion gap: 8 (ref 5–15)
BUN: 19 mg/dL (ref 8–23)
CO2: 24 mmol/L (ref 22–32)
Calcium: 8 mg/dL — ABNORMAL LOW (ref 8.9–10.3)
Chloride: 103 mmol/L (ref 98–111)
Creatinine, Ser: 1.15 mg/dL (ref 0.61–1.24)
GFR, Estimated: >60 mL/min (ref >60)
Glucose, Bld: 131 mg/dL — ABNORMAL HIGH (ref 70–99)
Potassium: 3.1 mmol/L — ABNORMAL LOW (ref 3.5–5.1)
Sodium: 135 mmol/L (ref 135–145)

## 2023-04-15 LAB — GLUCOSE, CAPILLARY
Glucose-Capillary: 133 mg/dL — ABNORMAL HIGH (ref 70–99)
Glucose-Capillary: 201 mg/dL — ABNORMAL HIGH (ref 70–99)
Glucose-Capillary: 139 mg/dL — ABNORMAL HIGH (ref 70–99)
Glucose-Capillary: 113 mg/dL — ABNORMAL HIGH (ref 70–99)

## 2023-04-15 LAB — HEPARIN LEVEL (UNFRACTIONATED)
Heparin Unfractionated: 0.54 [IU]/mL (ref 0.30–0.70)
Heparin Unfractionated: 0.36 [IU]/mL (ref 0.30–0.70)

## 2023-04-15 MED ORDER — CLOPIDOGREL BISULFATE 75 MG PO TABS
75.0000 mg | ORAL_TABLET | Freq: Every day | ORAL | Status: DC
  Administered 2023-04-15 – 2023-04-22 (×8): 75 mg via ORAL
  Filled 2023-04-15 (×8): qty 1

## 2023-04-15 MED ORDER — POTASSIUM CHLORIDE CRYS ER 20 MEQ PO TBCR
40.0000 meq | EXTENDED_RELEASE_TABLET | ORAL | Status: AC
  Administered 2023-04-15 (×2): 40 meq via ORAL
  Filled 2023-04-15 (×2): qty 2

## 2023-04-15 NOTE — Progress Notes (Signed)
PHARMACY - ANTICOAGULATION CONSULT NOTE  Pharmacy Consult for IV heparin Indication: chest pain/ACS  Allergies  Allergen Reactions   Iodinated Contrast Media Hives and Itching   Penicillins Other (See Comments)    Unknown reaction in childhood    Patient Measurements: Height: 5\' 11"  (180.3 cm) Weight: 97.4 kg (214 lb 11.2 oz) IBW/kg (Calculated) : 75.3 Heparin Dosing Weight: 95.1 kg  Vital Signs: Temp: 97.8 F (36.6 C) (11/14 1500) Temp Source: Oral (11/14 1500) BP: 127/79 (11/14 1500) Pulse Rate: 78 (11/14 1500)  Labs: Recent Labs    04/13/23 0556 04/14/23 0328 04/14/23 0817 04/15/23 0558 04/15/23 1733  HGB 11.7* 11.5*  --  12.3*  --   HCT 35.5* 35.3*  --  39.7  --   PLT 162 195  --  188  --   HEPARINUNFRC 0.33 0.43  --  0.54 0.36  CREATININE  --   --  1.11 1.15  --     Estimated Creatinine Clearance: 69.1 mL/min (by C-G formula based on SCr of 1.15 mg/dL).   Medical History: Past Medical History:  Diagnosis Date   Allergic reaction to contrast dye    Bipolar affective disorder (HCC)    Coronary atherosclerosis of native coronary artery    BMS to RCA 2008, [facility], Dr. 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 No AC PTA.  Pt s/p LHC with severe LM and LAD disease, heparin resumed, TCTS consulted for CABG. MRI with acute infarcts 11/12, heparin ok to continue per neuro. Heparin level now therapeutic at 0.36 on heparin 1850 units/hr, CBC stable.  Goal of Therapy:  Heparin level 0.3-0.5 units/ml Monitor platelets by anticoagulation protocol: Yes   Plan:  Continue  heparin at 1850 units/hr Check confirmatory heparin level with AM labs Daily heparin level and CBC  Loralee Pacas, PharmD, BCPS Clinical Pharmacist 04/15/2023  7:23 PM  Please check AMION for all [facility] Pharmacy phone numbers After 10:00 PM, call Main Pharmacy [phone]

## 2023-04-15 NOTE — Progress Notes (Signed)
PHARMACY - ANTICOAGULATION CONSULT NOTE  Pharmacy Consult for IV heparin Indication: chest pain/ACS  Allergies  Allergen Reactions   Iodinated Contrast Media Hives and Itching   Penicillins Other (See Comments)    Unknown reaction in childhood    Patient Measurements: Height: 5\' 11"  (180.3 cm) Weight: 97.4 kg (214 lb 11.2 oz) IBW/kg (Calculated) : 75.3 Heparin Dosing Weight: 95.1 kg  Vital Signs: Temp: 97.8 F (36.6 C) (11/14 0708) Temp Source: Oral (11/14 0708) BP: 124/71 (11/14 0415) Pulse Rate: 83 (11/14 0415)  Labs: Recent Labs    04/13/23 0556 04/14/23 0328 04/14/23 0817 04/15/23 0558  HGB 11.7* 11.5*  --  12.3*  HCT 35.5* 35.3*  --  39.7  PLT 162 195  --  188  HEPARINUNFRC 0.33 0.43  --  0.54  CREATININE  --   --  1.11 1.15    Estimated Creatinine Clearance: 69.1 mL/min (by C-G formula based on SCr of 1.15 mg/dL).   Medical History: Past Medical History:  Diagnosis Date   Allergic reaction to contrast dye    Bipolar affective disorder (HCC)    Coronary atherosclerosis of native coronary artery    BMS to RCA 2008, [facility], Dr. Katrinka Blazing   Diabetes mellitus without complication [facility])    GERD (gastroesophageal reflux disease)    Hard of hearing    Right ear   Hyperlipidemia    Hypertension    Myocardial infarction [facility])    IMI 2008   Osteoarthritis    Ringing in right ear     Medications:  Infusions:   heparin 1,950 Units/hr (04/15/23 0159)    Assessment: 72 yo male with NSTEMI transferred to [facility] with IV heparin running at 1130 units/hr. No AC PTA.  Pt s/p LHC with severe LM and LAD disease, heparin resumed, TCTS consulted for CABG. MRI with acute infarcts 11/12, heparin ok to continue per neuro. Heparin level supratherapeutic at 0.54, CBC stable.  Goal of Therapy:  Heparin level 0.3-0.5 units/ml Monitor platelets by anticoagulation protocol: Yes   Plan:  Decrease heparin to 1850 units/hr 8 hour heparin level Daily heparin level  and CBC  Trixie Rude, PharmD Clinical Pharmacist 04/15/2023  9:09 AM

## 2023-04-15 NOTE — Progress Notes (Signed)
STROKE TEAM PROGRESS NOTE   SUBJECTIVE (INTERVAL HISTORY) No family is at the bedside.  Patient lying in bed, overall his condition is stable.  Seems not good candidate for CABG, now on DAPT. Continue heparin IV per cardiology.    OBJECTIVE Temp:  [97.8 F (36.6 C)-98.2 F (36.8 C)] 98 F (36.7 C) (11/14 1203) Pulse Rate:  [79-84] 80 (11/14 1002) Cardiac Rhythm: Normal sinus rhythm (11/14 0747) Resp:  [17-18] 18 (11/14 1203) BP: (113-128)/(71-86) 128/74 (11/14 1203) SpO2:  [93 %-94 %] 93 % (11/14 0415)  Recent Labs  Lab 04/14/23 1207 04/14/23 1622 04/14/23 2103 04/15/23 0752 04/15/23 1202  GLUCAP 154* 187* 135* 133* 201*   Recent Labs  Lab 04/09/23 1747 04/10/23 0702 04/12/23 0518 04/14/23 0817 04/15/23 0558  NA 141 136 135 139 135  K 3.5 3.4* 3.1* 3.0* 3.1*  CL 102 103 102 104 103  CO2 27 23 24 28 24   GLUCOSE 111* 128* 153* 130* 131*  BUN 24* 26* 16 22 19   CREATININE 1.03 0.97 0.73 1.11 1.15  CALCIUM 8.6* 8.2* 8.3* 8.2* 8.0*  MG 2.1  --   --   --   --    Recent Labs  Lab 04/14/23 0817  AST 27  ALT 28  ALKPHOS 69  BILITOT 0.9  PROT 5.7*  ALBUMIN 2.4*   Recent Labs  Lab 04/09/23 1747 04/11/23 0423 04/12/23 0518 04/13/23 0556 04/14/23 0328 04/15/23 0558  WBC 9.9 10.2 11.2* 12.2* 15.4* 10.3  NEUTROABS 8.4*  --   --   --   --   --   HGB 12.9* 11.1* 11.1* 11.7* 11.5* 12.3*  HCT 40.5 34.0* 34.3* 35.5* 35.3* 39.7  MCV 78.5* 75.9* 75.6* 74.6* 75.9* 79.9*  PLT 141* 121* 130* 162 195 188   No results for input(s): "CKTOTAL", "CKMB", "CKMBINDEX", "TROPONINI" in the last 168 hours. No results for input(s): "LABPROT", "INR" in the last 72 hours. No results for input(s): "COLORURINE", "LABSPEC", "PHURINE", "GLUCOSEU", "HGBUR", "BILIRUBINUR", "KETONESUR", "PROTEINUR", "UROBILINOGEN", "NITRITE", "LEUKOCYTESUR" in the last 72 hours.  Invalid input(s): "APPERANCEUR"     Component Value Date/Time   CHOL 111 04/10/2023 0702   TRIG 94 04/10/2023 0702   HDL 41  04/10/2023 0702   CHOLHDL 2.7 04/10/2023 0702   VLDL 19 04/10/2023 0702   LDLCALC 51 04/10/2023 0702   Lab Results  Component Value Date   HGBA1C 6.8 (H) 11/19/2022   No results found for: "LABOPIA", "COCAINSCRNUR", "LABBENZ", "AMPHETMU", "THCU", "LABBARB"  No results for input(s): "ETH" in the last 168 hours.  I have personally reviewed the radiological images below and agree with the radiology interpretations.  VAS US CAROTID  Result Date: 04/14/2023 Carotid Arterial Duplex Study Patient Name:  DWAUN SOSEBEE Shaul  Date of Exam:   04/14/2023 Medical Rec #: [id number]       Accession #:    [id number] Date of Birth: 08/27/1950        Patient Gender: M Patient Age:   72 years Exam Location:  [facility] Procedure:      VAS US CAROTID Referring Phys: ERIC LINDZEN --------------------------------------------------------------------------------  Indications:       CVA. Risk Factors:      Hypertension, hyperlipidemia, Diabetes, no history of                    smoking, prior MI, coronary artery disease. Comparison Study:  No previous exams Performing Technologist: Jody Hill RVT, RDMS  Examination Guidelines: A complete evaluation includes B-mode imaging, spectral  Doppler, color Doppler, and power Doppler as needed of all accessible portions of each vessel. Bilateral testing is considered an integral part of a complete examination. Limited examinations for reoccurring indications may be performed as noted.  Right Carotid Findings: +----------+--------+--------+--------+------------------+--------+           PSV cm/sEDV cm/sStenosisPlaque DescriptionComments +----------+--------+--------+--------+------------------+--------+ CCA Prox  62      12                                         +----------+--------+--------+--------+------------------+--------+ CCA Distal55      11              calcific                   +----------+--------+--------+--------+------------------+--------+ ICA  Prox  54      12      1-39%   calcific                   +----------+--------+--------+--------+------------------+--------+ ICA Distal41      12                                         +----------+--------+--------+--------+------------------+--------+ +----------+--------+-------+--------+-------------------+           PSV cm/sEDV cmsDescribeArm Pressure (mmHG) +----------+--------+-------+--------+-------------------+ [id number]                                         +----------+--------+-------+--------+-------------------+ +---------+--------+--+--------+-+ VertebralPSV cm/s26EDV cm/s8 +---------+--------+--+--------+-+  Left Carotid Findings: +----------+--------+--------+--------+------------------+------------------+           PSV cm/sEDV cm/sStenosisPlaque DescriptionComments           +----------+--------+--------+--------+------------------+------------------+ CCA Prox  70      12                                intimal thickening +----------+--------+--------+--------+------------------+------------------+ CCA Distal64      16              focal and calcificintimal thickening +----------+--------+--------+--------+------------------+------------------+ ICA Prox  40      11              calcific          intimal thickening +----------+--------+--------+--------+------------------+------------------+ ICA Distal59      18                                                   +----------+--------+--------+--------+------------------+------------------+ ECA       74      7                                                    +----------+--------+--------+--------+------------------+------------------+ +----------+--------+--------+--------+-------------------+           PSV cm/sEDV cm/sDescribeArm Pressure (mmHG) +----------+--------+--------+--------+-------------------+ [id number]                                           +----------+--------+--------+--------+-------------------+ +---------+--------+--+--------+--+  VertebralPSV cm/s35EDV cm/s11 +---------+--------+--+--------+--+   Summary: Right Carotid: Velocities in the right ICA are consistent with a 1-39% stenosis. Left Carotid: The extracranial vessels were near-normal with only minimal wall               thickening or plaque. Vertebrals:  Bilateral vertebral arteries demonstrate antegrade flow. Subclavians: Normal flow hemodynamics were seen in bilateral subclavian              arteries. *See table(s) above for measurements and observations.     Preliminary    MR ANGIO HEAD WO CONTRAST  Result Date: 04/14/2023 CLINICAL DATA:  Provided history: Stroke/TIA, determine embolic source. Altered mental status. Slurred speech. EXAM: MRA HEAD WITHOUT CONTRAST TECHNIQUE: Angiographic images of the Circle of Willis were acquired using MRA technique without intravenous contrast. COMPARISON:  Brain MRI 04/13/2023. FINDINGS: Anterior circulation: The intracranial internal carotid arteries are patent. The M1 middle cerebral arteries are patent. No M2 proximal branch occlusion or high-grade proximal stenosis. The anterior cerebral arteries are patent. 1-2 mm vascular protrusion arising from the proximal cavernous right ICA, which may reflect an aneurysm or the origin of an otherwise poorly delineated branch vessel (series 352, image 11). Posterior circulation: The intracranial vertebral arteries are patent. The right vertebral artery is developmentally diminutive beyond the PICA origin. The basilar artery is patent. The posterior cerebral arteries are patent. Severe stenosis within the right PCA at the P1/P2 junction. Posterior communicating arteries are present bilaterally. Anatomic variants: As described. IMPRESSION: 1. No intracranial proximal large vessel occlusion identified. 2. Severe stenosis within the right posterior cerebral artery at the P1/P2 junction. 3. 1-2 mm  vascular protrusion arising from the proximal cavernous right internal carotid artery, which may reflect an aneurysm or the origin of an otherwise poorly delineated branch vessel. Electronically Signed   By: Jackey Loge D.O.   On: 04/14/2023 11:31   MR BRAIN WO CONTRAST  Result Date: 04/13/2023 CLINICAL DATA:  Mental status change, unknown cause. Slurred speech beginning today. EXAM: MRI HEAD WITHOUT CONTRAST TECHNIQUE: Multiplanar, multiecho pulse sequences of the brain and surrounding structures were obtained without intravenous contrast. COMPARISON:  Head CT 04/08/2023 FINDINGS: Brain: Diffusion imaging shows acute infarction affecting the right para median pons. Small focus of acute infarction within the right cerebellar hemisphere. Two small foci of acute infarction affecting the right posterior temporal lobe and right occipital lobe. No other acute finding. Extensive chronic small-vessel ischemic changes seen otherwise throughout the cerebral hemispheric white matter. Old punctate foci of hemosiderin deposition associated with some of the old small vessel infarctions. Old basal ganglia infarction on the right with hemosiderin deposition. Ex vacuo enlargement of the frontal horn of the right lateral ventricle. No evidence of obstructive hydrocephalus. Chronic encephalomalacia the inferior frontal lobes, more extensive on the left than the right, the pattern most consistent with distant closed head injury. No mass lesion. No extra-axial collection. Vascular: Major vessels at the base of the brain show flow. This includes flow within both vertebral arteries and the basilar artery. The left vertebral artery is dominant, but the small non dominant right vertebral artery does appear to show flow. Skull and upper cervical spine: Negative Sinuses/Orbits: Clear/normal Other: None IMPRESSION: 1. Acute infarction affecting the right para median pons. Small focus of acute infarction within the right cerebellar  hemisphere. Two small foci of acute infarction affecting the right posterior temporal lobe and right occipital lobe. Findings all consistent with posterior circulation insult. 2. Extensive chronic small-vessel ischemic changes otherwise throughout the  cerebral hemispheric white matter. Old right basal ganglia infarction with hemosiderin deposition. 3. 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 Electronically Signed   By: Paulina Fusi M.D.   On: 04/13/2023 11:37   CARDIAC CATHETERIZATION  Result Date: 04/12/2023 Severe left main disease with diffuse 80-90% calcified stenosis Severe mid-LAD stenosis of 80% Critical ostial RCA stenosis with a 95-99% ulcerated plaque Nonobstructive LCx stenosis Mild-moderate segmental LV dysfunction with severely elevated LVEDP Recommend: TCTS consult for consideration of CABG. Resume heparin. Diuresis as tolerated.   DG Ankle 2 Views Left  Result Date: 04/11/2023 CLINICAL DATA:  Left ankle pain. EXAM: LEFT ANKLE - 2 VIEW COMPARISON:  None Available. FINDINGS: There is no evidence of acute fracture or dislocation. Degenerative changes are noted at the ankle and midfoot. Calcaneal spurring is noted. There are enthesopathic changes at the insertion site of the Achilles tendon. Vascular calcifications are noted in the soft tissues. IMPRESSION: 1. No acute fracture or dislocation. 2. Scattered degenerative changes. Electronically Signed   By: Thornell Sartorius M.D.   On: 04/11/2023 02:21   ECHOCARDIOGRAM COMPLETE  Result Date: 04/10/2023    ECHOCARDIOGRAM REPORT   Patient Name:   ARINZECHUKWU MACQUARRIE Steeves Date of Exam: 04/10/2023 Medical Rec #:  [id number]      Height:       71.0 in Accession #:    [id number]     Weight:       214.7 lb Date of Birth:  03-May-1951       BSA:          2.173 m Patient Age:    72 years       BP:           129/71 mmHg Patient Gender: M              HR:           96 bpm. Exam Location:  Inpatient  Procedure: 2D Echo, Color Doppler, Cardiac Doppler and Intracardiac            Opacification Agent Indications:    NSTEMI  History:        Patient has prior history of Echocardiogram examinations, most                 recent 11/20/2022. CAD and NSTEMI; Risk Factors:Hypertension,                 Dyslipidemia and Diabetes.  Sonographer:    [facility] Referring Phys: [id number] Cecille Po MELVIN IMPRESSIONS  1. No thrombus is seen (Definity contrast was used). Left ventricular ejection fraction, by estimation, is 40 to 45%. The left ventricle has mildly decreased function. The left ventricle demonstrates regional wall motion abnormalities (see scoring diagram/findings for description). Left ventricular diastolic parameters are consistent with Grade I diastolic dysfunction (impaired relaxation). A very small area of the inferoapical "cap" appears to be frankly dyskinetic.  2. Right ventricular systolic function is normal. The right ventricular size is normal. Tricuspid regurgitation signal is inadequate for assessing PA pressure.  3. Left atrial size was moderately dilated.  4. The mitral valve is normal in structure. Mild to moderate mitral valve regurgitation.  5. The aortic valve is tricuspid. There is mild calcification of the aortic valve. There is mild thickening of the aortic valve. Aortic valve regurgitation is not visualized. Aortic valve sclerosis/calcification is present, without any evidence of aortic stenosis.  6. The inferior vena cava is  dilated in size with <50% respiratory variability, suggesting right atrial pressure of 15 mmHg. Comparison(s): Prior images reviewed side by side. The left ventricular function is worsened. The left ventricular wall motion is new. FINDINGS  Left Ventricle: No thrombus is seen (Definity contrast was used). Left ventricular ejection fraction, by estimation, is 40 to 45%. The left ventricle has mildly decreased function. The left ventricle demonstrates regional wall motion  abnormalities. Definity contrast agent was given IV to delineate the left ventricular endocardial borders. The left ventricular internal cavity size was normal in size. There is no left ventricular hypertrophy. Left ventricular diastolic parameters are consistent with Grade I diastolic dysfunction (impaired relaxation). Indeterminate filling pressures.  LV Wall Scoring: The apex is dyskinetic. The entire inferior wall and posterior wall are hypokinetic. A very small area of the inferoapical "cap" appears to be frankly dyskinetic. Right Ventricle: The right ventricular size is normal. Right vetricular wall thickness was not well visualized. Right ventricular systolic function is normal. Tricuspid regurgitation signal is inadequate for assessing PA pressure. Left Atrium: Left atrial size was moderately dilated. Right Atrium: Right atrial size was normal in size. Pericardium: There is no evidence of pericardial effusion. Mitral Valve: The mitral valve is normal in structure. Mild to moderate mitral valve regurgitation, with centrally-directed jet. Tricuspid Valve: The tricuspid valve is normal in structure. Tricuspid valve regurgitation is not demonstrated. Aortic Valve: The aortic valve is tricuspid. There is mild calcification of the aortic valve. There is mild thickening of the aortic valve. Aortic valve regurgitation is not visualized. Aortic valve sclerosis/calcification is present, without any evidence of aortic stenosis. Aortic valve mean gradient measures 6.0 mmHg. Aortic valve peak gradient measures 10.6 mmHg. Aortic valve area, by VTI measures 1.89 cm. Pulmonic Valve: The pulmonic valve was normal in structure. Pulmonic valve regurgitation is not visualized. Aorta: The aortic root is normal in size and structure. Venous: The inferior vena cava is dilated in size with less than 50% respiratory variability, suggesting right atrial pressure of 15 mmHg. IAS/Shunts: No atrial level shunt detected by color flow  Doppler.  LEFT VENTRICLE PLAX 2D LVIDd:         5.50 cm   Diastology LVIDs:         4.30 cm   LV e' medial:    7.07 cm/s LV PW:         1.10 cm   LV E/e' medial:  13.0 LV IVS:        1.00 cm   LV e' lateral:   9.79 cm/s LVOT diam:     2.20 cm   LV E/e' lateral: 9.4 LV SV:         52 LV SV Index:   24 LVOT Area:     3.80 cm  RIGHT VENTRICLE RV Basal diam:  3.10 cm RV Mid diam:    1.70 cm RV S prime:     12.90 cm/s TAPSE (M-mode): 2.1 cm LEFT ATRIUM             Index        RIGHT ATRIUM           Index LA diam:        4.70 cm 2.16 cm/m   RA Area:     14.50 cm LA Vol (A2C):   68.2 ml 31.38 ml/m  RA Volume:   31.10 ml  14.31 ml/m LA Vol (A4C):   44.0 ml 20.25 ml/m LA Biplane Vol: 54.3 ml 24.99 ml/m  AORTIC VALVE  AV Area (Vmax):    1.91 cm AV Area (Vmean):   1.82 cm AV Area (VTI):     1.89 cm AV Vmax:           163.00 cm/s AV Vmean:          113.000 cm/s AV VTI:            0.275 m AV Peak Grad:      10.6 mmHg AV Mean Grad:      6.0 mmHg LVOT Vmax:         81.80 cm/s LVOT Vmean:        54.000 cm/s LVOT VTI:          0.137 m LVOT/AV VTI ratio: 0.50  AORTA Ao Root diam: 3.60 cm MITRAL VALVE MV Area (PHT): 4.29 cm     SHUNTS MV Decel Time: 177 msec     Systemic VTI:  0.14 m MV E velocity: 92.20 cm/s   Systemic Diam: 2.20 cm MV A velocity: 113.00 cm/s MV E/A ratio:  0.82 Mihai Croitoru MD Electronically signed by Thurmon Fair MD Signature Date/Time: 04/10/2023/6:09:56 PM    Final    DG CHEST PORT 1 VIEW  Result Date: 04/09/2023 CLINICAL DATA:  Fever EXAM: PORTABLE CHEST 1 VIEW COMPARISON:  Chest x-ray 04/08/2023 FINDINGS: Small right pleural effusion has mildly increased. The left lung is clear. There is no pneumothorax. The cardiomediastinal silhouette appears stable. The osseous structures are unchanged. IMPRESSION: Small right pleural effusion has mildly increased. Electronically Signed   By: Darliss Cheney M.D.   On: 04/09/2023 22:29     PHYSICAL EXAM  Temp:  [97.8 F (36.6 C)-98.2 F (36.8 C)] 98 F  (36.7 C) (11/14 1203) Pulse Rate:  [79-84] 80 (11/14 1002) Resp:  [17-18] 18 (11/14 1203) BP: (113-128)/(71-86) 128/74 (11/14 1203) SpO2:  [93 %-94 %] 93 % (11/14 0415)  General - Well nourished, well developed, in no apparent distress, mildly drowsy sleepy.  Ophthalmologic - fundi not visualized due to noncooperation.  Cardiovascular - Regular rhythm and rate.  Neuro - mildly drowsy sleepy but easily arousable, eyes open on voice, orientated to age, place, time. No aphasia but mild dysarthria and paucity of speech, following all simple commands. Able to name and repeat. No gaze palsy, tracking bilaterally, visual field full. No significant facial droop. Tongue midline. Bilateral UEs 5/5, no drift but significant LUE ataxia. Bilaterally LEs 3/5 proximally, RLE 4/5 distally but LLE foot drop with ankle DF/PF 0/5. Sensation symmetrical bilaterally, gait not tested.     ASSESSMENT/PLAN Mr. RYOTT CERVENY is a 72 y.o. male with history of hypertension, hyperlipidemia, CAD and MI status post PCI, obesity admitted for shortness of breath and elevated troponin.  Status post cardiac cath on 04/12/2023 found to have severe CAD.  1 day after cath, patient found to have slurred speech, left upper extremity ataxia, left facial droop.  No tPA given due to outside window.    Stroke: Scattered post circulation infarcts, embolic pattern, could be related to cardiac cath procedure versus large vessel disease in the setting of procedure/hypotension CT no acute abnormality, old right BG infarct MRI showed right pontine infarct.  Small focus of acute infarction within the right cerebellar hemisphere.  2 small foci of acute infarction affecting the right posterior temporal lobe and right occipital lobe. 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 Carotid Doppler unremarkable 2D Echo EF 40 to 45% LDL 51 HgbA1c 5.9 UDS pending Heparin IV for VTE prophylaxis  aspirin 81 mg daily prior to admission,  now on aspirin 81 mg daily and plavix DAPT. Continue heparin IV per cardiology.  Ongoing aggressive stroke risk factor management Therapy recommendations: CIR versus SNF Disposition: Pending  Non-STEMI Cardiomyopathy History of CAD/MI status post PCI Elevated troponin level Cardiac cath showed severe CAD EF 40 to 45% On metoprolol Not good candidate for CABG On aspirin and plavix DAPT now On heparin IV, management per cardiology  Diabetes HgbA1c 5.9 goal < 7.0 Controlled Currently on insulin CBG monitoring SSI DM education and close PCP follow up  Hypertension Stable on the low end Avoid low BP On metoprolol Long term BP goal normotensive  Hyperlipidemia Home meds: Lipitor 10 LDL 51, goal < 70 Now on Lipitor 80 Continue statin at discharge  Other Stroke Risk Factors Advanced age Obesity, BMI 29.94  Other Active Problems Leukocytosis, WBC 11.2--12.2--15.4--10.3  Hospital day # 6  Neurology will sign off. Please call with questions. Pt will follow up with stroke clinic NP at [facility] in about 4 weeks. Thanks for the consult.   Marvel Plan, MD PhD Stroke Neurology 04/15/2023 1:16 PM    To contact Stroke Continuity provider, please refer to [email or web address]. After hours, contact General Neurology

## 2023-04-15 NOTE — Progress Notes (Signed)
Progress Note   Patient: Michael Rose WFU:[id number] DOB: 29-Jul-1950 DOA: 04/09/2023     6 DOS: the patient was seen and examined on 04/15/2023   Brief hospital course: 72 y.o. male past medical history significant for essential hypertension diabetes mellitus type 2 CAD status post PCI, BPH comes in with shortness of breath that started on 04/08/2023, he has been having nonbilious vomiting for about a week prior to admission at [facility] twelve-lead EKG shows sinus tachycardia with diffuse ST segment depression troponins doubled with every repeat last 1 was greater than 3000 chest x-ray showed vascular congestion with right pleural effusion, white count of 17 was transferred to [facility]. 2D echo was done that showed an EF of 40% with regional wall motion abnormality, small inferoapical area which appeared to be frankly dyskinetic.   Heart cath revealed severe multivessel disease with CT surgery consulted for consideration for cabg. 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 Cardiology consulted left heart cath on 04/12/2023, showed severe left main disease 80 to 90%. CT surgery was consulted and following. Deciding on options while below CVA is being addressed by Neurology -Per Cardiology, not CABG candidate at this point. Plan to resume plavix 75mg  without load and cont heparin pending interventional recommendations. If there are no plans for PCI, then would d/c heparin at that time  Acute R paramedian pons CVA, Acute R cerebellar hemisphere CVA, Acute R post temporal lobe and R occipital lobe CVA -new diagnosis this visit, MRI reviewed -Neurology following -MRI performed -F/u with stroke team. Pt is continued on heparin gtt for now with ASA 81mg  and plavix DAPT -Neurology to f/u with [facility] in 4 weeks after d/c    Leukocytosis/watery bowel movement: Has remained afebrile No further diarrhea, Tmax was 100.2 respiratory panel was negative. Leukocytosis resolved.  Likely due to steroids   Essential hypertension: Continue metoprolol and Imdur.   Iron deficiency anemia: Will need follow-up with PCP as an outpatient. Patient not remember when was his last colonoscopy.   Hyperlipidemia Continue statins.   Type 2 diabetes mellitus without complication, without long-term current use of insulin (HCC): He received steroids single dose, which makes his blood glucose erratic. Increase long-acting insulin continue sliding scale CBGs before meals and at bedtime.     Obesity: Noted has been counseled.   Anxiety/depression: Continue trazodone and fluoxetine   Benign prostatic hyperplasia Cont home meds  Sacral non-pressure wound present on admit Wound / Incision (Open or Dehisced) 04/14/23 Non-pressure wound Sacrum Red draining full thickness (Active)  Date First Assessed/Time First Assessed: 04/14/23 1003   Wound Type: Non-pressure wound  Location: Sacrum  Wound Description (Comments): Red draining full thickness  Present on Admission: Yes    Assessments 04/13/2023  8:26 PM 04/15/2023 10:00 AM  Dressing Type -- Foam - Lift dressing to assess site every shift  Dressing Changed -- New  Dressing Status -- Clean, Dry, Intact  Wound Length (cm) 0.3 cm --  Wound Width (cm) 0.3 cm --  Wound Depth (cm) 2 cm --  Wound Volume (cm^3) 0.18 cm^3 --  Wound Surface Area (cm^2) 0.09 cm^2 --  Tunneling (cm) -- 2cm at 11 o'clock position  Margins -- Attached edges (approximated)  Closure -- None  Drainage Amount -- Minimal  Drainage Description -- Purulent;Serosanguineous;Odor - foul     No associated orders.  Subjective: Without complaints or chest pain this AM  Physical Exam: Vitals:   04/15/23 0708 04/15/23 1002 04/15/23 1203 04/15/23 1500  BP: 113/86 127/75 128/74   Pulse: 79 80    Resp: 18   18 16   Temp: 97.8 F (36.6 C)  98 F (36.7 C) 97.8 F (36.6 C)  TempSrc: Oral  Oral Oral  SpO2:      Weight:      Height:       General exam: Conversant, in no acute distress Respiratory system: normal chest rise, clear, no audible wheezing Cardiovascular system: regular rhythm, s1-s2 Gastrointestinal system: Nondistended, nontender, pos BS Central nervous system: No seizures, no tremors Extremities: No cyanosis, no joint deformities Skin: No rashes, no pallor Psychiatry: Affect normal // no auditory hallucinations   Data Reviewed:  Labs reviewed: Na 135, K 3.1, Cr 1.15, WBC 10.3, Hgb 12.3  Family Communication: pt in room, family not at bedside  Disposition: Status is: Inpatient Remains inpatient appropriate because: severity of illness  Planned Discharge Destination:  unclear at this time     Author: Rickey Barbara, MD 04/15/2023 4:22 PM  For on call review [email or web address].

## 2023-04-15 NOTE — Progress Notes (Signed)
Patient ID: Michael Rose, male   DOB: 31-Dec-1950, 73 y.o.   MRN: [id number] [facility] Surgery Progress Note  3 Days Post-Op  Subjective: CC-  Less sacral pain today. Ongoing purulent drainage from this area. WBC WNL, afebrile.  Objective: Vital signs in last 24 hours: Temp:  [97.8 F (36.6 C)-98.2 F (36.8 C)] 97.8 F (36.6 C) (11/14 0708) Pulse Rate:  [79-85] 80 (11/14 1002) Resp:  [17-18] 18 (11/14 0708) BP: (105-127)/(63-86) 127/75 (11/14 1002) SpO2:  [93 %-96 %] 93 % (11/14 0415) Last BM Date : 04/14/23  Intake/Output from previous day: 11/13 0701 - 11/14 0700 In: 3 [I.V.:3] Out: 700 [Urine:700] Intake/Output this shift: Total I/O In: -  Out: 900 [Urine:900]  PE: General: chronically ill appearing male who is laying in bed in NAD Lungs: respiratory effort nonlabored on room air Skin: 0.5x0.5x0.5cm opening over sacrum with ongoing purulent drainage, there is about 2cm tunneling distally. No significant overlying cellulitis, fluctuance, or induration. No crepitance   Lab Results:  Recent Labs    04/14/23 0328 04/15/23 0558  WBC 15.4* 10.3  HGB 11.5* 12.3*  HCT 35.3* 39.7  PLT 195 188   BMET Recent Labs    04/14/23 0817 04/15/23 0558  NA 139 135  K 3.0* 3.1*  CL 104 103  CO2 28 24  GLUCOSE 130* 131*  BUN 22 19  CREATININE 1.11 1.15  CALCIUM 8.2* 8.0*   PT/INR No results for input(s): "LABPROT", "INR" in the last 72 hours. CMP     Component Value Date/Time   NA 135 04/15/2023 0558   K 3.1 (L) 04/15/2023 0558   CL 103 04/15/2023 0558   CO2 24 04/15/2023 0558   GLUCOSE 131 (H) 04/15/2023 0558   BUN 19 04/15/2023 0558   CREATININE 1.15 04/15/2023 0558   CALCIUM 8.0 (L) 04/15/2023 0558   PROT 5.7 (L) 04/14/2023 0817   ALBUMIN 2.4 (L) 04/14/2023 0817   AST 27 04/14/2023 0817   ALT 28 04/14/2023 0817   ALKPHOS 69 04/14/2023 0817   BILITOT 0.9 04/14/2023 0817   GFRNONAA >60 04/15/2023 0558   GFRAA 80 (L) 10/22/2012 0425   Lipase      Component Value Date/Time   LIPASE 24 05/29/2010 1032       Studies/Results: VAS US CAROTID  Result Date: 04/14/2023 Carotid Arterial Duplex Study Patient Name:  Michael Rose  Date of Exam:   04/14/2023 Medical Rec #: [id number]       Accession #:    [id number] Date of Birth: [birth year]        Patient Gender: M Patient Age:   40 years Exam Location:  [facility] Procedure:      VAS US CAROTID Referring Phys: ERIC LINDZEN --------------------------------------------------------------------------------  Indications:       CVA. Risk Factors:      Hypertension, hyperlipidemia, Diabetes, no history of                    smoking, prior MI, coronary artery disease. Comparison Study:  No previous exams Performing Technologist: Jody Hill RVT, RDMS  Examination Guidelines: A complete evaluation includes B-mode imaging, spectral Doppler, color Doppler, and power Doppler as needed of all accessible portions of each vessel. Bilateral testing is considered an integral part of a complete examination. Limited examinations for reoccurring indications may be performed as noted.  Right Carotid Findings: +----------+--------+--------+--------+------------------+--------+           PSV cm/sEDV cm/sStenosisPlaque DescriptionComments +----------+--------+--------+--------+------------------+--------+ CCA Prox  62  12                                         +----------+--------+--------+--------+------------------+--------+ CCA Distal55      11              calcific                   +----------+--------+--------+--------+------------------+--------+ ICA Prox  54      12      1-39%   calcific                   +----------+--------+--------+--------+------------------+--------+ ICA Distal41      12                                         +----------+--------+--------+--------+------------------+--------+ +----------+--------+-------+--------+-------------------+           PSV  cm/sEDV cmsDescribeArm Pressure (mmHG) +----------+--------+-------+--------+-------------------+ [id number]                                         +----------+--------+-------+--------+-------------------+ +---------+--------+--+--------+-+ VertebralPSV cm/s26EDV cm/s8 +---------+--------+--+--------+-+  Left Carotid Findings: +----------+--------+--------+--------+------------------+------------------+           PSV cm/sEDV cm/sStenosisPlaque DescriptionComments           +----------+--------+--------+--------+------------------+------------------+ CCA Prox  70      12                                intimal thickening +----------+--------+--------+--------+------------------+------------------+ CCA Distal64      16              focal and calcificintimal thickening +----------+--------+--------+--------+------------------+------------------+ ICA Prox  40      11              calcific          intimal thickening +----------+--------+--------+--------+------------------+------------------+ ICA Distal59      18                                                   +----------+--------+--------+--------+------------------+------------------+ ECA       74      7                                                    +----------+--------+--------+--------+------------------+------------------+ +----------+--------+--------+--------+-------------------+           PSV cm/sEDV cm/sDescribeArm Pressure (mmHG) +----------+--------+--------+--------+-------------------+ [id number]                                          +----------+--------+--------+--------+-------------------+ +---------+--------+--+--------+--+ VertebralPSV cm/s35EDV cm/s11 +---------+--------+--+--------+--+   Summary: Right Carotid: Velocities in the right ICA are consistent with a 1-39% stenosis. Left Carotid: The extracranial vessels were near-normal with only minimal wall  thickening or plaque. Vertebrals:  Bilateral vertebral arteries demonstrate antegrade flow. Subclavians: Normal flow hemodynamics were seen in bilateral subclavian              arteries. *See table(s) above for measurements and observations.     Preliminary    MR ANGIO HEAD WO CONTRAST  Result Date: 04/14/2023 CLINICAL DATA:  Provided history: Stroke/TIA, determine embolic source. Altered mental status. Slurred speech. EXAM: MRA HEAD WITHOUT CONTRAST TECHNIQUE: Angiographic images of the Circle of Willis were acquired using MRA technique without intravenous contrast. COMPARISON:  Brain MRI 04/13/2023. FINDINGS: Anterior circulation: The intracranial internal carotid arteries are patent. The M1 middle cerebral arteries are patent. No M2 proximal branch occlusion or high-grade proximal stenosis. The anterior cerebral arteries are patent. 1-2 mm vascular protrusion arising from the proximal cavernous right ICA, which may reflect an aneurysm or the origin of an otherwise poorly delineated branch vessel (series 352, image 11). Posterior circulation: The intracranial vertebral arteries are patent. The right vertebral artery is developmentally diminutive beyond the PICA origin. The basilar artery is patent. The posterior cerebral arteries are patent. Severe stenosis within the right PCA at the P1/P2 junction. Posterior communicating arteries are present bilaterally. Anatomic variants: As described. IMPRESSION: 1. No intracranial proximal large vessel occlusion identified. 2. Severe stenosis within the right posterior cerebral artery at the P1/P2 junction. 3. 1-2 mm vascular protrusion arising from the proximal cavernous right internal carotid artery, which may reflect an aneurysm or the origin of an otherwise poorly delineated branch vessel. Electronically Signed   By: Jackey Loge D.O.   On: 04/14/2023 11:31    Anti-infectives: Anti-infectives (From admission, onward)    None         Assessment/Plan Sacral wound with abscess - Ongoing purulent drainage but there is no significant overlying cellulitis, fluctuance or induration. Seems to be decompressing well on its own. Would not recommend any surgical intervention. We will recheck tomorrow to ensure this completely decompresses.   ID - none VTE - heparin gtt FEN - CM diet Foley - none   NSTEMI CAD, multivessel disease, hx PCI 2010 Acute stroke HTN DM HLD  I reviewed hospitalist notes, last 24 h vitals and pain scores, last 48 h intake and output, last 24 h labs and trends, and last 24 h imaging results.    LOS: 6 days    Franne Forts, [facility] Surgery 04/15/2023, 11:38 AM Please see Amion for pager number during day hours 7:00am-4:30pm

## 2023-04-15 NOTE — Progress Notes (Signed)
Occupational Therapy Treatment Patient Details Name: Michael Rose MRN: [id number] DOB: Aug 29, 1950 Today's Date: 04/15/2023   History of present illness Pt is 72 year old presented to [facility] on  04/09/23 from [facility] for likely viral gastroenteritis and encephalopathy in setting on profound hypokalemia. Pt with elevated troponin and unclear if NSTEMI vs demand ischemia. Cardiology consulted and underwent cardiac cath on 11/11. On 04/13/23 pt developed new onset of Lt facial droop, Lt UE ataxia, and slurred speech. 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   OT comments  Pt seen for first OT session since CVA noted w/ L knee buckling noted in standing. Pt remains significantly limited by anxiety and perseveration on sacral wound pain. Pt able to progress OOB to chair with Max A x 2, then to [facility] and ultimately back to bed due to restlessness and difficulty redirecting while in chair posing safety concerns despite chair alarm. Based on continued physical impairments and lack of +2 assist at home, continue to recommend postacute rehab at [place].      If plan is discharge home, recommend the following:  Two people to help with walking and/or transfers;A lot of help with bathing/dressing/bathroom;Assistance with cooking/housework;Assist for transportation;Help with stairs or ramp for entrance;Direct supervision/assist for medications management;Direct supervision/assist for financial management   Equipment Recommendations  Other (comment) (TBD)    Recommendations for Other Services      Precautions / Restrictions Precautions Precautions: Fall Precaution Comments: very anxious Restrictions Weight Bearing Restrictions: No       Mobility Bed Mobility Overal bed mobility: Needs Assistance Bed Mobility: Sit  to Supine       Sit to supine: Max assist, +2 for physical assistance, +2 for safety/equipment, HOB elevated, Used rails        Transfers Overall transfer level: Needs assistance Equipment used: Rolling walker (2 wheels), 2 person hand held assist Transfers: Sit to/from Stand, Bed to chair/wheelchair/BSC Sit to Stand: Mod assist, +2 physical assistance, +2 safety/equipment   Squat pivot transfers: Max assist, +2 physical assistance, +2 safety/equipment       General transfer comment: Able to stand with Mod A x 2 from bedside with RW but unable to stand fully upright and pt anxious about unable to extend LLE fully. opted for squat pivot to recliner with cues for hand placement w/ Max A x 2. Pt with continued restlessness so OT called for RN in to assess; BSC transfer with Max  A x 2 and removal of BSC from behind pt and bed pulled up behind for pt to sit.     Balance Overall balance assessment: Needs assistance Sitting-balance support: No upper extremity supported, Feet supported, Bilateral upper extremity supported Sitting balance-Leahy Scale: Poor Sitting balance - Comments: reports L lean though unsure if due to anxiety; kept L hand on bedrail   Standing balance support: Bilateral upper extremity supported, During functional activity, Reliant on assistive device for balance Standing balance-Leahy Scale: Poor                             ADL either performed or assessed with clinical judgement   ADL Overall ADL's : Needs assistance/impaired Eating/Feeding: Set up;Sitting   Grooming: Minimal assistance;Sitting;Brushing hair Grooming Details (indicate cue type and reason): wetting hair with washcloth and  combing. able to use RUE to assist with task             Lower Body Dressing: Total assistance;Bed level   Toilet Transfer: Maximal assistance;+2 for physical assistance;+2 for safety/equipment;Squat-pivot;BSC/3in1 Toilet Transfer Details (indicate cue type  and reason): RN and OT transferring pt from recliner to [facility] on R side with LLE lagging behind. cues for hand placement Toileting- Clothing Manipulation and Hygiene: Total assistance;+2 for physical assistance;+2 for safety/equipment;Sitting/lateral lean;Sit to/from stand Toileting - Clothing Manipulation Details (indicate cue type and reason): RN and OT assisting pt in standing with L knee blocked while nursing student assisting with peri care.            Extremity/Trunk Assessment Upper Extremity Assessment Upper Extremity Assessment: Right hand dominant;LUE deficits/detail;RUE deficits/detail RUE Deficits / Details: Hand strength 4-/5, all other strength largerly 4+/5; ROM WFL; decreased fine motor coordination; pt reports numbness on ulnar side of hand since recent fall when he landed on his hand - MD aware RUE Sensation: decreased light touch RUE Coordination: decreased fine motor LUE Deficits / Details: fine motor coordination impaired. reports increased weakness on this side but unable to fully assess today LUE Coordination: decreased fine motor   Lower Extremity Assessment Lower Extremity Assessment: Defer to PT evaluation        Vision   Vision Assessment?: Vision impaired- to be further tested in functional context Additional Comments: reports wearing glasses normally and reports new L eye blurriness. when asked about double vision, pt reports "sometimes" but denied diplopia during session. to be further assessed   Perception     Praxis      Cognition Arousal: Alert Behavior During Therapy: Anxious, Restless Overall Cognitive Status: No family/caregiver present to determine baseline cognitive functioning Area of Impairment: Attention, Safety/judgement, Awareness, Problem solving, Following commands                   Current Attention Level: Focused   Following Commands: Follows one step commands consistently, Follows one step commands with increased  time Safety/Judgement: Decreased awareness of safety, Decreased awareness of deficits Awareness: Emergent, Intellectual Problem Solving: Slow processing, Decreased initiation, Difficulty sequencing, Requires verbal cues General Comments: very anxious and very restless. able to follow directions but needs constant reassusrance for safety, sequencing, etc. memory deficits as pt reports constipation though RN reports recent hx of loose stools        Exercises      Shoulder Instructions       General Comments BP elevated 160s after activity with pt reporting dizziness.    Pertinent Vitals/ Pain       Pain Assessment Pain Assessment: Faces Faces Pain Scale: Hurts even more Pain Location: buttock at sacral wound Pain Descriptors / Indicators: Grimacing, Guarding, Sore Pain Intervention(s): Monitored during session, Limited activity within patient's tolerance, Repositioned, Premedicated before session  Home Living                                          Prior Functioning/Environment              Frequency  Min 1X/week        Progress Toward Goals  OT Goals(current goals can now be found in the care plan section)  Progress towards OT goals: OT to reassess next treatment  Acute Rehab OT Goals Patient Stated Goal: get medical issues resolved OT Goal Formulation: With  patient Time For Goal Achievement: 04/24/23 Potential to Achieve Goals: Good ADL Goals Pt Will Perform Grooming: with contact guard assist;standing Pt Will Perform Lower Body Bathing: with contact guard assist;with adaptive equipment;sitting/lateral leans;sit to/from stand Pt Will Perform Lower Body Dressing: with contact guard assist;with adaptive equipment;sitting/lateral leans;sit to/from stand Pt Will Transfer to Toilet: with contact guard assist;ambulating;bedside commode Pt Will Perform Toileting - Clothing Manipulation and hygiene: sitting/lateral leans;sit to/from stand;with min  assist Pt/caregiver will Perform Home Exercise Program: Increased strength;With theraputty;With Supervision;With written HEP provided  Plan      Co-evaluation                 AM-PAC OT "6 Clicks" Daily Activity     Outcome Measure   Help from another person eating meals?: A Little Help from another person taking care of personal grooming?: A Little Help from another person toileting, which includes using toliet, bedpan, or urinal?: A Lot Help from another person bathing (including washing, rinsing, drying)?: A Lot Help from another person to put on and taking off regular upper body clothing?: A Little Help from another person to put on and taking off regular lower body clothing?: Total 6 Click Score: 14    End of Session Equipment Utilized During Treatment: Gait belt  OT Visit Diagnosis: Pain;Other (comment);Other abnormalities of gait and mobility (R26.89);Ataxia, unspecified (R27.0)   Activity Tolerance Patient limited by fatigue;Treatment limited secondary to agitation   Patient Left in bed;with call bell/phone within reach;with nursing/sitter in room   Nurse Communication Mobility status;Other (comment) (RN in with OT)        Time: 1610-9604 OT Time Calculation (min): 43 min  Charges: OT General Charges $OT Visit: 1 Visit OT Treatments $Self Care/Home Management : 8-22 mins $Therapeutic Activity: 23-37 mins  Bradd Canary, OTR/L Acute Rehab Services Office: [phone]   Lorre Munroe 04/15/2023, 12:06 PM

## 2023-04-15 NOTE — Progress Notes (Signed)
Patient Name: Michael Rose Date of Encounter: 04/15/2023 [facility] Cardiologist: None   Interval Summary  .    Patient reports feeling well today. No chest pain. Breathing improved.   Vital Signs .    Vitals:   04/14/23 1404 04/14/23 2015 04/15/23 0415 04/15/23 0708  BP:  118/74 124/71   Pulse:  84 83   Resp: 18 17 18 18   Temp: 97.8 F (36.6 C) 98.2 F (36.8 C) 98 F (36.7 C) 97.8 F (36.6 C)  TempSrc: Oral Oral Oral Oral  SpO2:  94% 93%   Weight:      Height:        Intake/Output Summary (Last 24 hours) at 04/15/2023 0955 Last data filed at 04/15/2023 0846 Gross per 24 hour  Intake 3 ml  Output 1600 ml  Net -1597 ml      04/10/2023    5:33 AM 11/20/2022   11:20 AM 11/19/2022    4:20 PM  Last 3 Weights  Weight (lbs) 214 lb 11.2 oz 228 lb 13.4 oz 229 lb  Weight (kg) 97.387 kg 103.8 kg 103.874 kg      Telemetry/ECG    NSR with rare PVCs - Personally Reviewed  Physical Exam .   GEN: Chronically ill appearing elderly male. Sitting upright in the bed.  Neck: No JVD Cardiac: RRR, no murmurs, rubs, or gallops  Respiratory: Clear to auscultation bilaterally. Normal work of breathing on room air  GI: Soft, nontender, non-distended  MS: No edema in BLE   Assessment & Plan .     NSTEMI  CAD  - Patient presented to [facility] with weakness, AMS.  Troponins elevated 398 >> 711 >> 1,431 >> 1,665 >> 1,922 >> 2,853 - Underwent  LHC on 11/11 that showed severe left main disease with diffuse 80-90% calcified stenosis, severe 80% mid LAD stenosis, critical 95-99% ostial RCA stenosis. Overall, recommended TCTS consultation for consideration of CABG - Patient was seen by CT surgery on 11/12- with his acute CVA with residual deficits, limited mobility with sacral decubitus ulcer, unlikely that he would recover well from sternotomy. No plans for surgery at this time  - Case will be discussed at cath conference tomorrow - continue ASA, lipitor, metoprolol   - Continue IV heparin   Acute CVA  - On 11/12, patient developed slurred speech. Underwent Brain MRI that showed acute infarction affecting the right para median pons, small focus of acute infarction within the right cerebellar hemisphere, two small foci of acute infarction affecting the right posterior temporal lobe and right occipital lobe. Findings consistent with posterior circulation insult  - Brain MRA yesterday showed severe stenosis within the right posterior cerebral artery at the P1/P2 junction - Followed by neurology- suspect related to cardiac cath vs large vessel disease in the setting of procedure/hypotension  - Continue IV heparin for now. Has not been started on DAPT given question of upcoming CABG  Acute Systolic Heart Failure  - Echocardiogram this admission showed EF 40-45%. LVEDP was 34 mmHg during cath on 11/11 - Patient has been on IV lasix 40 mg BID- creatinine stable today at 1.15 - Currently net -5.06 L since admission  - Appears euvolemic on exam. Already received AM dose of IV lasix today. Stop IV lasix and plan to start PO lasix tomorrow  - Continue metoprolol succinate 100 mg daily  - With recent stroke, cautious to avoid hypotension. BP stable, but lower end of normal. Hold off on additional GDMT for  now   HLD  - Lipid panel this admission showed LDL 51 - Continue lipitor 80 mg daily   Otherwise per primary - Diarrhea  - Iron Deficiency Anemia  - Type 2 DM  - Sacral wound   For questions or updates, please contact [facility] Please consult [email or web address] for contact info under        Signed, Jonita Albee, PA-C

## 2023-04-15 NOTE — Plan of Care (Signed)
  Problem: Education: Goal: Knowledge of General Education information will improve Description: Including pain rating scale, medication(s)/side effects and non-pharmacologic comfort measures Outcome: Progressing   Problem: Health Behavior/Discharge Planning: Goal: Ability to manage health-related needs will improve Outcome: Progressing   Problem: Clinical Measurements: Goal: Ability to maintain clinical measurements within normal limits will improve Outcome: Progressing Goal: Will remain free from infection Outcome: Progressing Goal: Diagnostic test results will improve Outcome: Progressing Goal: Respiratory complications will improve Outcome: Progressing Goal: Cardiovascular complication will be avoided Outcome: Progressing   Problem: Activity: Goal: Risk for activity intolerance will decrease Outcome: Progressing   Problem: Nutrition: Goal: Adequate nutrition will be maintained Outcome: Progressing   Problem: Coping: Goal: Level of anxiety will decrease Outcome: Progressing   Problem: Elimination: Goal: Will not experience complications related to bowel motility Outcome: Progressing Goal: Will not experience complications related to urinary retention Outcome: Progressing   Problem: Pain Management: Goal: General experience of comfort will improve Outcome: Progressing   Problem: Safety: Goal: Ability to remain free from injury will improve Outcome: Progressing   Problem: Skin Integrity: Goal: Risk for impaired skin integrity will decrease Outcome: Progressing   Problem: Education: Goal: Understanding of cardiac disease, CV risk reduction, and recovery process will improve Outcome: Progressing Goal: Individualized Educational Video(s) Outcome: Progressing   Problem: Activity: Goal: Ability to tolerate increased activity will improve Outcome: Progressing   Problem: Cardiac: Goal: Ability to achieve and maintain adequate cardiovascular perfusion will  improve Outcome: Progressing   Problem: Health Behavior/Discharge Planning: Goal: Ability to safely manage health-related needs after discharge will improve Outcome: Progressing   Problem: Education: Goal: Ability to describe self-care measures that may prevent or decrease complications (Diabetes Survival Skills Education) will improve Outcome: Progressing Goal: Individualized Educational Video(s) Outcome: Progressing   Problem: Coping: Goal: Ability to adjust to condition or change in health will improve Outcome: Progressing   Problem: Fluid Volume: Goal: Ability to maintain a balanced intake and output will improve Outcome: Progressing   Problem: Health Behavior/Discharge Planning: Goal: Ability to identify and utilize available resources and services will improve Outcome: Progressing Goal: Ability to manage health-related needs will improve Outcome: Progressing   Problem: Metabolic: Goal: Ability to maintain appropriate glucose levels will improve Outcome: Progressing   Problem: Nutritional: Goal: Maintenance of adequate nutrition will improve Outcome: Progressing Goal: Progress toward achieving an optimal weight will improve Outcome: Progressing   Problem: Skin Integrity: Goal: Risk for impaired skin integrity will decrease Outcome: Progressing   Problem: Tissue Perfusion: Goal: Adequacy of tissue perfusion will improve Outcome: Progressing   Problem: Education: Goal: Understanding of CV disease, CV risk reduction, and recovery process will improve Outcome: Progressing Goal: Individualized Educational Video(s) Outcome: Progressing   Problem: Activity: Goal: Ability to return to baseline activity level will improve Outcome: Progressing   Problem: Cardiovascular: Goal: Ability to achieve and maintain adequate cardiovascular perfusion will improve Outcome: Progressing Goal: Vascular access site(s) Level 0-1 will be maintained Outcome: Progressing    Problem: Health Behavior/Discharge Planning: Goal: Ability to safely manage health-related needs after discharge will improve Outcome: Progressing

## 2023-04-16 DIAGNOSIS — I214 Non-ST elevation (NSTEMI) myocardial infarction: Secondary | ICD-10-CM | POA: Diagnosis not present

## 2023-04-16 DIAGNOSIS — I2510 Atherosclerotic heart disease of native coronary artery without angina pectoris: Secondary | ICD-10-CM | POA: Diagnosis not present

## 2023-04-16 LAB — COMPREHENSIVE METABOLIC PANEL
ALT: 29 U/L (ref 0–44)
AST: 25 U/L (ref 15–41)
Albumin: 2.4 g/dL — ABNORMAL LOW (ref 3.5–5.0)
Alkaline Phosphatase: 94 U/L (ref 38–126)
Anion gap: 9 (ref 5–15)
BUN: 15 mg/dL (ref 8–23)
CO2: 21 mmol/L — ABNORMAL LOW (ref 22–32)
Calcium: 8.2 mg/dL — ABNORMAL LOW (ref 8.9–10.3)
Chloride: 106 mmol/L (ref 98–111)
Creatinine, Ser: 0.8 mg/dL (ref 0.61–1.24)
GFR, Estimated: >60 mL/min (ref >60)
Glucose, Bld: 187 mg/dL — ABNORMAL HIGH (ref 70–99)
Potassium: 3.4 mmol/L — ABNORMAL LOW (ref 3.5–5.1)
Sodium: 136 mmol/L (ref 135–145)
Total Bilirubin: 0.7 mg/dL (ref <1.2)
Total Protein: 5.8 g/dL — ABNORMAL LOW (ref 6.5–8.1)

## 2023-04-16 LAB — MAGNESIUM: Magnesium: 1.9 mg/dL (ref 1.7–2.4)

## 2023-04-16 LAB — CBC
HCT: 38.6 % — ABNORMAL LOW (ref 39.0–52.0)
Hemoglobin: 12.1 g/dL — ABNORMAL LOW (ref 13.0–17.0)
MCH: 24.3 pg — ABNORMAL LOW (ref 26.0–34.0)
MCHC: 31.3 g/dL (ref 30.0–36.0)
MCV: 77.7 fL — ABNORMAL LOW (ref 80.0–100.0)
Platelets: 248 10*3/uL (ref 150–400)
RBC: 4.97 MIL/uL (ref 4.22–5.81)
RDW: 16.5 % — ABNORMAL HIGH (ref 11.5–15.5)
WBC: 10.1 10*3/uL (ref 4.0–10.5)
nRBC: 0 % (ref 0.0–0.2)

## 2023-04-16 LAB — HEMOGLOBIN A1C
Hgb A1c MFr Bld: 6.4 % — ABNORMAL HIGH (ref 4.8–5.6)
Mean Plasma Glucose: 136.98 mg/dL

## 2023-04-16 LAB — GLUCOSE, CAPILLARY
Glucose-Capillary: 178 mg/dL — ABNORMAL HIGH (ref 70–99)
Glucose-Capillary: 132 mg/dL — ABNORMAL HIGH (ref 70–99)
Glucose-Capillary: 122 mg/dL — ABNORMAL HIGH (ref 70–99)
Glucose-Capillary: 146 mg/dL — ABNORMAL HIGH (ref 70–99)

## 2023-04-16 LAB — HEPARIN LEVEL (UNFRACTIONATED): Heparin Unfractionated: 0.45 [IU]/mL (ref 0.30–0.70)

## 2023-04-16 MED ORDER — METRONIDAZOLE 500 MG PO TABS: 500.0000 mg | ORAL_TABLET | Freq: Two times a day (BID) | ORAL | Status: DC

## 2023-04-16 MED ORDER — POTASSIUM CHLORIDE CRYS ER 20 MEQ PO TBCR
40.0000 meq | EXTENDED_RELEASE_TABLET | ORAL | Status: AC
  Administered 2023-04-16 (×2): 40 meq via ORAL
  Filled 2023-04-16 (×2): qty 2

## 2023-04-16 MED ORDER — METRONIDAZOLE 500 MG/100ML IV SOLN: 500.0000 mg | Freq: Two times a day (BID) | INTRAVENOUS | Status: DC

## 2023-04-16 MED ORDER — HEPARIN SODIUM (PORCINE) 5000 UNIT/ML IJ SOLN
5000.0000 [IU] | Freq: Three times a day (TID) | INTRAMUSCULAR | Status: DC
  Administered 2023-04-16 – 2023-04-22 (×19): 5000 [IU] via SUBCUTANEOUS
  Filled 2023-04-16 (×19): qty 1

## 2023-04-16 MED ORDER — DIPHENHYDRAMINE HCL 50 MG/ML IJ SOLN
25.0000 mg | Freq: Once | INTRAMUSCULAR | Status: DC | PRN
  Filled 2023-04-16: qty 1

## 2023-04-16 MED ORDER — SODIUM CHLORIDE 0.9 % IV SOLN: 2.0000 g | Freq: Three times a day (TID) | INTRAVENOUS | Status: DC

## 2023-04-16 MED ORDER — DOXYCYCLINE HYCLATE 100 MG PO TABS
100.0000 mg | ORAL_TABLET | Freq: Two times a day (BID) | ORAL | Status: DC
  Administered 2023-04-16 – 2023-04-22 (×13): 100 mg via ORAL
  Filled 2023-04-16 (×13): qty 1

## 2023-04-16 MED ORDER — AMOXICILLIN-POT CLAVULANATE 875-125 MG PO TABS
1.0000 | ORAL_TABLET | Freq: Two times a day (BID) | ORAL | Status: DC
  Administered 2023-04-16 – 2023-04-22 (×13): 1 via ORAL
  Filled 2023-04-16 (×14): qty 1

## 2023-04-16 MED ORDER — EPINEPHRINE 0.3 MG/0.3ML IJ SOAJ: 0.3000 mg | Freq: Once | INTRAMUSCULAR | Status: DC | PRN

## 2023-04-16 NOTE — Progress Notes (Addendum)
Pharmacy Note- Penicillin Allergy Clarification   ASSESSMENT:   PEN-FAST Scoring   Five years or less since last reaction 0  Anaphylaxis/Angioedema OR Severe cutaneous adverse reaction  0  Treatment required for reaction  0  Total Score 0 points - Very low risk of positive penicillin allergy test (<1%)      Type of intervention:  Re-label as intolerance  Impact on therapy:  Penicillin allergy removed   PLAN: - Augmentin x1 - Q69min vitals monitoring x 1 hour  - Epi-Pen PRN  - IV diphenhydramine PRN  - plan communicated to primary RN   POST-CHALLENGE FOLLOW-UP:  Per RN, no reaction noted after Augmentin dose - will continue with current regimen and monitor for development of rash.  Trixie Rude, PharmD Clinical Pharmacist 04/16/2023  2:33 PM

## 2023-04-16 NOTE — Progress Notes (Signed)
PHARMACY - ANTICOAGULATION CONSULT NOTE  Pharmacy Consult for IV heparin Indication: chest pain/ACS  Allergies  Allergen Reactions   Iodinated Contrast Media Hives and Itching   Penicillins Other (See Comments)    Unknown reaction in childhood    Patient Measurements: Height: 5\' 11"  (180.3 cm) Weight: 97.4 kg (214 lb 11.2 oz) IBW/kg (Calculated) : 75.3 Heparin Dosing Weight: 95.1 kg  Vital Signs: Temp: 98.3 F (36.8 C) (11/15 0733) Temp Source: Oral (11/15 0733) BP: 137/82 (11/15 0733) Pulse Rate: 77 (11/15 0733)  Labs: Recent Labs    04/14/23 0328 04/14/23 0817 04/15/23 0558 04/15/23 1733 04/16/23 0534  HGB 11.5*  --  12.3*  --  12.1*  HCT 35.3*  --  39.7  --  38.6*  PLT 195  --  188  --  248  HEPARINUNFRC 0.43  --  0.54 0.36 0.45  CREATININE  --  1.11 1.15  --  0.80    Estimated Creatinine Clearance: 99.3 mL/min (by C-G formula based on SCr of 0.8 mg/dL).   Medical History: Past Medical History:  Diagnosis Date   Allergic reaction to contrast dye    Bipolar affective disorder (HCC)    Coronary atherosclerosis of native coronary artery    BMS to RCA 2008, [facility], Dr. Katrinka Blazing   Diabetes mellitus without complication [facility])    GERD (gastroesophageal reflux disease)    Hard of hearing    Right ear   Hyperlipidemia    Hypertension    Myocardial infarction [facility])    IMI 2008   Osteoarthritis    Ringing in right ear     Medications:  Infusions:   heparin 1,850 Units/hr (04/16/23 0524)    Assessment: 72 yo male with NSTEMI transferred to [facility] with IV heparin running at 1130 units/hr. No AC PTA.  Pt s/p LHC with severe LM and LAD disease, heparin resumed, TCTS consulted for CABG. MRI with acute infarcts 11/12, heparin ok to continue per neuro. Heparin level 0.45 is therapeutic on heparin 1850 units/hr, CBC stable.  Goal of Therapy:  Heparin level 0.3-0.5 units/ml Monitor platelets by anticoagulation protocol: Yes   Plan:  Continue heparin at  1850 units/hr Daily heparin level and CBC F/u intervention plans  Trixie Rude, PharmD Clinical Pharmacist 04/16/2023  8:20 AM

## 2023-04-16 NOTE — Progress Notes (Addendum)
Patient Name: Michael Rose Date of Encounter: 04/16/2023 [facility] Cardiologist: None   Interval Summary  .    Patient denies chest pain, shortness of breath. Feels tired.   Vital Signs .    Vitals:   04/15/23 1949 04/16/23 0348 04/16/23 0733 04/16/23 0817  BP: 119/75 125/61 137/82 137/82  Pulse: 79 74 77 84  Resp: 20 19 18    Temp: 98.7 F (37.1 C) 97.7 F (36.5 C) 98.3 F (36.8 C)   TempSrc: Oral Oral Oral   SpO2: 98% 97% 95%   Weight:      Height:        Intake/Output Summary (Last 24 hours) at 04/16/2023 1007 Last data filed at 04/16/2023 0408 Gross per 24 hour  Intake 650 ml  Output 700 ml  Net -50 ml      04/10/2023    5:33 AM 11/20/2022   11:20 AM 11/19/2022    4:20 PM  Last 3 Weights  Weight (lbs) 214 lb 11.2 oz 228 lb 13.4 oz 229 lb  Weight (kg) 97.387 kg 103.8 kg 103.874 kg      Telemetry/ECG    NSR - Personally Reviewed  Physical Exam .   GEN: No acute distress. Laying in the bed, asleep but arouses easily to voice Neck: No JVD Cardiac: RRR, no murmurs, rubs, or gallops. Left radial cath site is soft, nontender  Respiratory: Clear to auscultation bilaterally. Breathing unlabored  GI: Soft, nontender, non-distended  MS: No edema in BLE   Assessment & Plan .    NSTEMI  CAD  - Patient presented to [facility] with weakness, AMS.  Troponins elevated 398 >> 711 >> 1,431 >> 1,665 >> 1,922 >> 2,853 - Underwent  LHC on 11/11 that showed severe left main disease with diffuse 80-90% calcified stenosis, severe 80% mid LAD stenosis, critical 95-99% ostial RCA stenosis. Overall, recommended TCTS consultation for consideration of CABG - Patient was seen by CT surgery on 11/12- with his acute CVA with residual deficits, limited mobility with sacral decubitus ulcer, unlikely that he would recover well from sternotomy. No plans for surgery at this time  - Patient is very high risk for PCI- plan for medical management   - continue ASA, plavix ,  lipitor, metoprolol    Acute CVA  - On 11/12, patient developed slurred speech. Underwent Brain MRI that showed acute infarction affecting the right para median pons, small focus of acute infarction within the right cerebellar hemisphere, two small foci of acute infarction affecting the right posterior temporal lobe and right occipital lobe. Findings consistent with posterior circulation insult  - Brain MRA yesterday showed severe stenosis within the right posterior cerebral artery at the P1/P2 junction - Followed by neurology- suspect related to cardiac cath vs large vessel disease in the setting of procedure/hypotension  - Continue ASA, plavix    Acute Systolic Heart Failure  - Echocardiogram this admission showed EF 40-45%. LVEDP was 34 mmHg during cath on 11/11 - Patient has been on IV lasix 40 mg BID- creatinine stable today at 1.15 - Currently net -5.06 L since admission  - Appears euvolemic on exam. Already received AM dose of IV lasix today. Stop IV lasix and plan to start PO lasix tomorrow  - Continue metoprolol succinate 100 mg daily  - With recent stroke, cautious to avoid hypotension. BP stable, but lower end of normal. Hold off on additional GDMT for now    HLD  - Lipid panel this admission showed  LDL 51 - Continue lipitor 80 mg daily    Otherwise per primary - Diarrhea  - Iron Deficiency Anemia  - Type 2 DM  - Sacral wound   For questions or updates, please contact [facility] Please consult [email or web address] for contact info under        Signed, Jonita Albee, PA-C

## 2023-04-16 NOTE — Progress Notes (Signed)
Progress Note   Patient: Michael Rose ZOX:[id number] DOB: 1950-09-12 DOA: 04/09/2023     7 DOS: the patient was seen and examined on 04/16/2023   Brief hospital course: 72 y.o. male past medical history significant for essential hypertension diabetes mellitus type 2 CAD status post PCI, BPH comes in with shortness of breath that started on 04/08/2023, he has been having nonbilious vomiting for about a week prior to admission at [facility] twelve-lead EKG shows sinus tachycardia with diffuse ST segment depression troponins doubled with every repeat last 1 was greater than 3000 chest x-ray showed vascular congestion with right pleural effusion, white count of 17 was transferred to [facility]. 2D echo was done that showed an EF of 40% with regional wall motion abnormality, small inferoapical area which appeared to be frankly dyskinetic.   Heart cath revealed severe multivessel disease with CT surgery consulted for consideration for cabg. 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 Cardiology consulted left heart cath on 04/12/2023, showed severe left main disease 80 to 90%. CT surgery was consulted and following. Deciding on options while below CVA is being addressed by Neurology -Per Cardiology, not CABG candidate at this point. Pt also not recommended for high-risk intervention. Heparin has been d/c'd -Pt now back on DAPT  Acute R paramedian pons CVA, Acute R cerebellar hemisphere CVA, Acute R post temporal lobe and R occipital lobe CVA -new diagnosis this visit, MRI reviewed -Neurology following -MRI performed -F/u with stroke team. Pt was on heparin with ASA 81mg  and plavix DAPT -Heparin stopped by Cardiology per above -Neurology to f/u with [facility] in 4 weeks after d/c   Leukocytosis/watery bowel movement: Has remained  afebrile No further diarrhea, Tmax was 100.2 respiratory panel was negative. Leukocytosis resolved.  Likely due to steroids   Essential hypertension: Continue metoprolol and Imdur.   Iron deficiency anemia: Will need follow-up with PCP as an outpatient. Patient not remember when was his last colonoscopy.   Hyperlipidemia Continue statins.   Type 2 diabetes mellitus without complication, without long-term current use of insulin (HCC): He received steroids single dose, which makes his blood glucose erratic. Increase long-acting insulin continue sliding scale CBGs before meals and at bedtime.    Obesity: Noted has been counseled.   Anxiety/depression: Continue trazodone and fluoxetine   Benign prostatic hyperplasia Cont home meds  Sacral non-pressure wound present on admit Wound / Incision (Open or Dehisced) 04/14/23 Non-pressure wound Sacrum Red draining full thickness (Active)  Date First Assessed/Time First Assessed: 04/14/23 1003   Wound Type: Non-pressure wound  Location: Sacrum  Wound Description (Comments): Red draining full thickness  Present on Admission: Yes    Assessments 04/13/2023  8:26 PM 04/16/2023  1:02 PM  Dressing Type -- Foam - Lift dressing to assess site every shift  Dressing Changed -- Changed  Dressing Status -- Clean, Dry, Intact  Dressing Change Frequency -- Twice a day  Wound Length (cm) 0.3 cm --  Wound Width (cm) 0.3 cm --  Wound Depth (cm) 2 cm --  Wound Volume (cm^3) 0.18 cm^3 --  Wound Surface Area (cm^2) 0.09 cm^2 --  Drainage Amount -- Moderate  Drainage Description -- Serosanguineous     No associated orders.      Subjective:Sleepy this AM. Without complaints.   Physical Exam: Vitals:   04/16/23 1206 04/16/23 1302 04/16/23 1338 04/16/23  1400  BP: (!) 142/86 134/89 (!) 146/81   Pulse: 84 83 80   Resp: 18 18 18    Temp: 98.2 F (36.8 C) 98.2 F (36.8 C)  98.2 F (36.8 C)  TempSrc: Oral Oral  Oral  SpO2: 98% 97% 100%   Weight:       Height:       General exam: Conversant, in no acute distress Respiratory system: normal chest rise, clear, no audible wheezing Cardiovascular system: regular rhythm, s1-s2 Gastrointestinal system: Nondistended, nontender, pos BS Central nervous system: No seizures, no tremors Extremities: No cyanosis, no joint deformities Skin: No rashes, no pallor Psychiatry: Affect normal // no auditory hallucinations   Data Reviewed:  Labs reviewed: Na 136, K 3.4, Cr 0.80, WBC 10.1, Hgb 12.1, Plts 248  Family Communication: pt in room, family not at bedside  Disposition: Status is: Inpatient Remains inpatient appropriate because: severity of illness  Planned Discharge Destination: Rehab     Author: Rickey Barbara, MD 04/16/2023 3:30 PM  For on call review [email or web address].

## 2023-04-16 NOTE — Progress Notes (Addendum)
Patient ID: Michael Rose, male   DOB: June 08, 1950, 72 y.o.   MRN: [id number] [facility] Surgery Progress Note  4 Days Post-Op  Subjective: CC-  No new complaints. Ongoing drainage from sacral area. WBC 10.1, afebrile.  Objective: Vital signs in last 24 hours: Temp:  [97.7 F (36.5 C)-98.7 F (37.1 C)] 98.3 F (36.8 C) (11/15 0733) Pulse Rate:  [74-84] 84 (11/15 0817) Resp:  [16-20] 18 (11/15 0733) BP: (119-137)/(61-82) 137/82 (11/15 0817) SpO2:  [95 %-98 %] 95 % (11/15 0733) Last BM Date : 04/15/23  Intake/Output from previous day: 11/14 0701 - 11/15 0700 In: 650 [P.O.:650] Out: 1600 [Urine:1600] Intake/Output this shift: No intake/output data recorded.  PE: General: chronically ill appearing male who is laying in bed in NAD Lungs: respiratory effort nonlabored on room air Skin: 0.5x0.5x0.5cm opening over sacrum with ongoing purulent drainage, there is about 2cm tunneling distally. No significant overlying cellulitis, fluctuance, or induration. No crepitance   Lab Results:  Recent Labs    04/15/23 0558 04/16/23 0534  WBC 10.3 10.1  HGB 12.3* 12.1*  HCT 39.7 38.6*  PLT 188 248   BMET Recent Labs    04/15/23 0558 04/16/23 0534  NA 135 136  K 3.1* 3.4*  CL 103 106  CO2 24 21*  GLUCOSE 131* 187*  BUN 19 15  CREATININE 1.15 0.80  CALCIUM 8.0* 8.2*   PT/INR No results for input(s): "LABPROT", "INR" in the last 72 hours. CMP     Component Value Date/Time   NA 136 04/16/2023 0534   K 3.4 (L) 04/16/2023 0534   CL 106 04/16/2023 0534   CO2 21 (L) 04/16/2023 0534   GLUCOSE 187 (H) 04/16/2023 0534   BUN 15 04/16/2023 0534   CREATININE 0.80 04/16/2023 0534   CALCIUM 8.2 (L) 04/16/2023 0534   PROT 5.8 (L) 04/16/2023 0534   ALBUMIN 2.4 (L) 04/16/2023 0534   AST 25 04/16/2023 0534   ALT 29 04/16/2023 0534   ALKPHOS 94 04/16/2023 0534   BILITOT 0.7 04/16/2023 0534   GFRNONAA >60 04/16/2023 0534   GFRAA 80 (L) 10/22/2012 0425   Lipase     Component  Value Date/Time   LIPASE 24 05/29/2010 1032       Studies/Results: VAS US CAROTID  Result Date: 04/14/2023 Carotid Arterial Duplex Study Patient Name:  ORBIE PICHA Borboa  Date of Exam:   04/14/2023 Medical Rec #: [id number]       Accession #:    [id number] Date of Birth: [birth year]        Patient Gender: M Patient Age:   28 years Exam Location:  [facility] Procedure:      VAS US CAROTID Referring Phys: ERIC LINDZEN --------------------------------------------------------------------------------  Indications:       CVA. Risk Factors:      Hypertension, hyperlipidemia, Diabetes, no history of                    smoking, prior MI, coronary artery disease. Comparison Study:  No previous exams Performing Technologist: Jody Hill RVT, RDMS  Examination Guidelines: A complete evaluation includes B-mode imaging, spectral Doppler, color Doppler, and power Doppler as needed of all accessible portions of each vessel. Bilateral testing is considered an integral part of a complete examination. Limited examinations for reoccurring indications may be performed as noted.  Right Carotid Findings: +----------+--------+--------+--------+------------------+--------+           PSV cm/sEDV cm/sStenosisPlaque DescriptionComments +----------+--------+--------+--------+------------------+--------+ CCA Prox  62  12                                         +----------+--------+--------+--------+------------------+--------+ CCA Distal55      11              calcific                   +----------+--------+--------+--------+------------------+--------+ ICA Prox  54      12      1-39%   calcific                   +----------+--------+--------+--------+------------------+--------+ ICA Distal41      12                                         +----------+--------+--------+--------+------------------+--------+ +----------+--------+-------+--------+-------------------+           PSV cm/sEDV  cmsDescribeArm Pressure (mmHG) +----------+--------+-------+--------+-------------------+ [id number]                                         +----------+--------+-------+--------+-------------------+ +---------+--------+--+--------+-+ VertebralPSV cm/s26EDV cm/s8 +---------+--------+--+--------+-+  Left Carotid Findings: +----------+--------+--------+--------+------------------+------------------+           PSV cm/sEDV cm/sStenosisPlaque DescriptionComments           +----------+--------+--------+--------+------------------+------------------+ CCA Prox  70      12                                intimal thickening +----------+--------+--------+--------+------------------+------------------+ CCA Distal64      16              focal and calcificintimal thickening +----------+--------+--------+--------+------------------+------------------+ ICA Prox  40      11              calcific          intimal thickening +----------+--------+--------+--------+------------------+------------------+ ICA Distal59      18                                                   +----------+--------+--------+--------+------------------+------------------+ ECA       74      7                                                    +----------+--------+--------+--------+------------------+------------------+ +----------+--------+--------+--------+-------------------+           PSV cm/sEDV cm/sDescribeArm Pressure (mmHG) +----------+--------+--------+--------+-------------------+ [id number]                                          +----------+--------+--------+--------+-------------------+ +---------+--------+--+--------+--+ VertebralPSV cm/s35EDV cm/s11 +---------+--------+--+--------+--+   Summary: Right Carotid: Velocities in the right ICA are consistent with a 1-39% stenosis. Left Carotid: The extracranial vessels were near-normal with only minimal wall  thickening or plaque. Vertebrals:  Bilateral vertebral arteries demonstrate antegrade flow. Subclavians: Normal flow hemodynamics were seen in bilateral subclavian              arteries. *See table(s) above for measurements and observations.     Preliminary    MR ANGIO HEAD WO CONTRAST  Result Date: 04/14/2023 CLINICAL DATA:  Provided history: Stroke/TIA, determine embolic source. Altered mental status. Slurred speech. EXAM: MRA HEAD WITHOUT CONTRAST TECHNIQUE: Angiographic images of the Circle of Willis were acquired using MRA technique without intravenous contrast. COMPARISON:  Brain MRI 04/13/2023. FINDINGS: Anterior circulation: The intracranial internal carotid arteries are patent. The M1 middle cerebral arteries are patent. No M2 proximal branch occlusion or high-grade proximal stenosis. The anterior cerebral arteries are patent. 1-2 mm vascular protrusion arising from the proximal cavernous right ICA, which may reflect an aneurysm or the origin of an otherwise poorly delineated branch vessel (series 352, image 11). Posterior circulation: The intracranial vertebral arteries are patent. The right vertebral artery is developmentally diminutive beyond the PICA origin. The basilar artery is patent. The posterior cerebral arteries are patent. Severe stenosis within the right PCA at the P1/P2 junction. Posterior communicating arteries are present bilaterally. Anatomic variants: As described. IMPRESSION: 1. No intracranial proximal large vessel occlusion identified. 2. Severe stenosis within the right posterior cerebral artery at the P1/P2 junction. 3. 1-2 mm vascular protrusion arising from the proximal cavernous right internal carotid artery, which may reflect an aneurysm or the origin of an otherwise poorly delineated branch vessel. Electronically Signed   By: Jackey Loge D.O.   On: 04/14/2023 11:31    Anti-infectives: Anti-infectives (From admission, onward)    Start     Dose/Rate Route Frequency  Ordered Stop   04/16/23 1400  ceFEPIme (MAXIPIME) 2 g in sodium chloride 0.9 % 100 mL IVPB        2 g 200 mL/hr over 30 Minutes Intravenous Every 8 hours 04/16/23 0955     04/16/23 1100  metroNIDAZOLE (FLAGYL) tablet 500 mg        500 mg Oral Every 12 hours 04/16/23 1008     04/16/23 1045  metroNIDAZOLE (FLAGYL) IVPB 500 mg  Status:  Discontinued        500 mg 100 mL/hr over 60 Minutes Intravenous Every 12 hours 04/16/23 0955 04/16/23 1008        Assessment/Plan Sacral wound with abscess - Ongoing purulent drainage but there is no significant overlying cellulitis, fluctuance or induration. Seems to be decompressing well on its own, but does still have quite a bit of purulent drainage. Would not recommend any surgical intervention at this point. Given his recent NSTEMI and CVA would be high risk for surgery. Continue antibiotics, wound care.    ID - doxy/augmentin VTE - heparin gtt, plavix FEN - CM diet Foley - none   NSTEMI CAD, multivessel disease, hx PCI 2010 Acute stroke HTN DM HLD  I reviewed last 24 h vitals and pain scores, last 48 h intake and output, and last 24 h labs and trends.    LOS: 7 days    Franne Forts, [facility] Surgery 04/16/2023, 9:49 AM Please see Amion for pager number during day hours 7:00am-4:30pm

## 2023-04-16 NOTE — Progress Notes (Signed)
Physical Therapy Treatment Patient Details Name: Michael Rose MRN: [id number] DOB: [birth year] Today's Date: 04/16/2023   History of Present Illness Pt is 72 year old presented to [facility] on  04/09/23 from [facility] for likely viral gastroenteritis and encephalopathy in setting on profound hypokalemia. Pt with elevated troponin and unclear if NSTEMI vs demand ischemia. Cardiology consulted and underwent cardiac cath on 11/11. On 04/13/23 pt developed new onset of Lt facial droop, Lt UE ataxia, and slurred speech. 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    PT Comments  Pt received in supine and agreeable to session. Pt demonstrates restlessness and anxiety during session, limiting progression of mobility away from the bed. Pt demonstrates improved bed mobility with up to min A, however did not follow cues for improved sequencing due to moving quickly. Pt reporting dizziness once sitting EOB, however BP stable. Pt able to stand x2 from EOB with min A +2, however demonstrates difficulty achieving an upright posture due to L knee instability. Pt appears very anxious once standing and is difficult to redirect with pt stating need to sit back to EOB despite encouragement. Pt declines transfer to recliner and requests to return to supine. Pt continues to benefit from PT services to progress toward functional mobility goals.    If plan is discharge home, recommend the following: Two people to help with walking and/or transfers;Two people to help with bathing/dressing/bathroom;Assistance with cooking/housework;Assist for transportation;Supervision due to cognitive status;Help with stairs or ramp for entrance;Assistance with feeding   Can travel by private vehicle     No  Equipment Recommendations  None recommended by  PT    Recommendations for Other Services       Precautions / Restrictions Precautions Precautions: Fall Precaution Comments: very anxious, L knee buckles Restrictions Weight Bearing Restrictions: No     Mobility  Bed Mobility Overal bed mobility: Needs Assistance Bed Mobility: Sit to Supine, Supine to Sit     Supine to sit: Min assist Sit to supine: Contact guard assist   General bed mobility comments: Min A for BLE management due to pt attempting to elevate trunk from sidelying with BLE still on the bed despite cues. Good power up to sitting using BUE. Pt with decreased control of descent back to supine, but is able to elevate BLE to EOB    Transfers Overall transfer level: Needs assistance Equipment used: Rolling walker (2 wheels) Transfers: Sit to/from Stand Sit to Stand: Min assist, +2 physical assistance           General transfer comment: from elevated EOB with min A +2 and pt pulling up on RW. Good power up to stand, however L knee buckles in standing requiring assist and dense cues for pt to achieve upright posture and remain standing. Pt able to briefly improve with cues for increased BUE support on RW, however pt quickly stating need to sit. Unable to attempt steps due to LLE instability and pt's anxiety    Modified Rankin (Stroke Patients Only) Modified Rankin (Stroke Patients Only) Pre-Morbid Rankin Score: Moderately severe disability Modified Rankin: Severe disability     Balance Overall balance assessment: Needs assistance Sitting-balance support: Feet supported, Bilateral upper extremity supported Sitting balance-Leahy Scale: Fair Sitting balance - Comments: sitting EOB with UE support   Standing balance support: Bilateral upper extremity supported,  During functional activity, Reliant on assistive device for balance Standing balance-Leahy Scale: Poor Standing balance comment: Pt reliant on RW and assist to maintain balance                             Cognition Arousal: Alert Behavior During Therapy: Anxious, Restless Overall Cognitive Status: Difficult to assess                                 General Comments: Pt very quick to initiate movements before instructions are given leading to increased anxiety. Pt requires heavy encouragement and reassurance        Exercises General Exercises - Lower Extremity Long Arc Quad: AROM, Seated, Both, 5 reps    General Comments General comments (skin integrity, edema, etc.): Pt reporting dizziness throughout session with BP stable sitting EOB      Pertinent Vitals/Pain Pain Assessment Pain Assessment: Faces Faces Pain Scale: Hurts little more Pain Location: buttock at sacral wound Pain Descriptors / Indicators: Grimacing, Guarding, Sore Pain Intervention(s): Monitored during session, Repositioned     PT Goals (current goals can now be found in the care plan section) Acute Rehab PT Goals Patient Stated Goal: get better PT Goal Formulation: With patient Time For Goal Achievement: 04/24/23 Progress towards PT goals: Progressing toward goals    Frequency    Min 1X/week       AM-PAC PT "6 Clicks" Mobility   Outcome Measure  Help needed turning from your back to your side while in a flat bed without using bedrails?: A Little Help needed moving from lying on your back to sitting on the side of a flat bed without using bedrails?: A Little Help needed moving to and from a bed to a chair (including a wheelchair)?: Total Help needed standing up from a chair using your arms (e.g., wheelchair or bedside chair)?: A Lot Help needed to walk in hospital room?: Total Help needed climbing 3-5 steps with a railing? : Total 6 Click Score: 11    End of Session Equipment Utilized During Treatment: Gait belt Activity Tolerance: Other (comment);Patient limited by fatigue (anxiety) Patient left: in bed;with call bell/phone within reach;with family/visitor  present Nurse Communication: Mobility status;Need for lift equipment PT Visit Diagnosis: Unsteadiness on feet (R26.81);Other abnormalities of gait and mobility (R26.89);Repeated falls (R29.6);Muscle weakness (generalized) (M62.81);Pain;Hemiplegia and hemiparesis     Time: 2440-1027 PT Time Calculation (min) (ACUTE ONLY): 23 min  Charges:    $Therapeutic Activity: 23-37 mins PT General Charges $$ ACUTE PT VISIT: 1 Visit                     Johny Shock, PTA Acute Rehabilitation Services Secure Chat Preferred  [phone]    Johny Shock 04/16/2023, 3:04 PM

## 2023-04-16 NOTE — Progress Notes (Signed)
Inpatient Rehabilitation Admissions Coordinator   I will place rehab consult to assess for candidacy for possible Cir admit.  Ottie Glazier, RN, MSN Rehab Admissions Coordinator [phone] 04/16/2023 5:11 PM

## 2023-04-17 DIAGNOSIS — I214 Non-ST elevation (NSTEMI) myocardial infarction: Secondary | ICD-10-CM | POA: Diagnosis not present

## 2023-04-17 LAB — CBC
HCT: 37.8 % — ABNORMAL LOW (ref 39.0–52.0)
Hemoglobin: 12.1 g/dL — ABNORMAL LOW (ref 13.0–17.0)
MCH: 24.5 pg — ABNORMAL LOW (ref 26.0–34.0)
MCHC: 32 g/dL (ref 30.0–36.0)
MCV: 76.5 fL — ABNORMAL LOW (ref 80.0–100.0)
Platelets: 245 10*3/uL (ref 150–400)
RBC: 4.94 MIL/uL (ref 4.22–5.81)
RDW: 16.5 % — ABNORMAL HIGH (ref 11.5–15.5)
WBC: 12.4 10*3/uL — ABNORMAL HIGH (ref 4.0–10.5)
nRBC: 0 % (ref 0.0–0.2)

## 2023-04-17 LAB — COMPREHENSIVE METABOLIC PANEL
ALT: 26 U/L (ref 0–44)
AST: 18 U/L (ref 15–41)
Albumin: 2.5 g/dL — ABNORMAL LOW (ref 3.5–5.0)
Alkaline Phosphatase: 72 U/L (ref 38–126)
Anion gap: 7 (ref 5–15)
BUN: 14 mg/dL (ref 8–23)
CO2: 22 mmol/L (ref 22–32)
Calcium: 8.5 mg/dL — ABNORMAL LOW (ref 8.9–10.3)
Chloride: 109 mmol/L (ref 98–111)
Creatinine, Ser: 0.74 mg/dL (ref 0.61–1.24)
GFR, Estimated: >60 mL/min (ref >60)
Glucose, Bld: 146 mg/dL — ABNORMAL HIGH (ref 70–99)
Potassium: 4.1 mmol/L (ref 3.5–5.1)
Sodium: 138 mmol/L (ref 135–145)
Total Bilirubin: 0.7 mg/dL (ref <1.2)
Total Protein: 5.9 g/dL — ABNORMAL LOW (ref 6.5–8.1)

## 2023-04-17 LAB — GLUCOSE, CAPILLARY
Glucose-Capillary: 141 mg/dL — ABNORMAL HIGH (ref 70–99)
Glucose-Capillary: 151 mg/dL — ABNORMAL HIGH (ref 70–99)
Glucose-Capillary: 148 mg/dL — ABNORMAL HIGH (ref 70–99)
Glucose-Capillary: 141 mg/dL — ABNORMAL HIGH (ref 70–99)

## 2023-04-17 NOTE — Progress Notes (Signed)
Progress Note   Patient: Michael Rose ZOX:[id number] DOB: Feb 07, 1951 DOA: 04/09/2023     8 DOS: the patient was seen and examined on 04/17/2023   Brief hospital course: 72 y.o. male past medical history significant for essential hypertension diabetes mellitus type 2 CAD status post PCI, BPH comes in with shortness of breath that started on 04/08/2023, he has been having nonbilious vomiting for about a week prior to admission at [facility] twelve-lead EKG shows sinus tachycardia with diffuse ST segment depression troponins doubled with every repeat last 1 was greater than 3000 chest x-ray showed vascular congestion with right pleural effusion, white count of 17 was transferred to [facility]. 2D echo was done that showed an EF of 40% with regional wall motion abnormality, small inferoapical area which appeared to be frankly dyskinetic.   Heart cath revealed severe multivessel disease with CT surgery consulted for consideration for cabg. 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 Cardiology consulted left heart cath on 04/12/2023, showed severe left main disease 80 to 90%. CT surgery was consulted and following. Deciding on options while below CVA is being addressed by Neurology -Per Cardiology, not CABG candidate at this point. Pt also not recommended for high-risk intervention. Heparin has been d/c'd -Pt now back on DAPT. 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 Pt was on heparin with ASA 81mg  and plavix DAPT -Heparin stopped by Cardiology per above -Neurology to f/u with [facility] in 4 weeks after d/c -Plan for possible CIR    Leukocytosis/watery bowel movement: Has remained afebrile No further diarrhea, Tmax was 100.2 respiratory panel was negative. Leukocytosis resolved.  Likely due to steroids   Essential hypertension: Continue metoprolol and Imdur.   Iron deficiency anemia: Will need follow-up with PCP as an outpatient. Patient not remember when was his last colonoscopy.   Hyperlipidemia Continue statins.   Type 2 diabetes mellitus without complication, without long-term current use of insulin (HCC): He received steroids single dose, which makes his blood glucose erratic. Increase long-acting insulin continue sliding scale CBGs before meals and at bedtime.    Obesity: Noted has been counseled.   Anxiety/depression: Continue trazodone and fluoxetine   Benign prostatic hyperplasia Cont home meds  Sacral non-pressure wound present on admit Wound / Incision (Open or Dehisced) 04/14/23 Non-pressure wound Sacrum Red draining full thickness (Active)  Date First Assessed/Time First Assessed: 04/14/23 1003   Wound Type: Non-pressure wound  Location: Sacrum  Wound Description (Comments): Red draining full thickness  Present on Admission: Yes    Assessments 04/13/2023  8:26 PM 04/17/2023  7:00 AM  Dressing Type -- Foam - Lift dressing to assess site every shift  Dressing Changed -- Changed  Dressing Status -- Clean, Dry, Intact  Wound Length (cm) 0.3 cm --  Wound Width (cm) 0.3 cm --  Wound Depth (cm) 2 cm --  Wound Volume (cm^3) 0.18 cm^3 --  Wound Surface Area (cm^2) 0.09 cm^2 --  Closure -- None     No associated orders.      Subjective:Without complaints this AM  Physical Exam: Vitals:   04/16/23 1643 04/16/23 1933 04/17/23 0319 04/17/23 1259  BP: 139/84 118/79 133/86 137/82  Pulse: 81 83 81 84  Resp: 18 16 20 18   Temp: 98.8 F (37.1 C) 99.1 F (37.3 C) 98.6 F (37 C) (!) 97.5 F (36.4 C)  TempSrc: Oral Oral Oral Oral  SpO2: 97% 93% 97% 96%  Weight:      Height:       General  exam: Awake, laying in bed, in nad Respiratory system: Normal respiratory effort, no wheezing Cardiovascular system: regular rate, s1, s2 Gastrointestinal system: Soft, nondistended, positive BS Central nervous system: CN2-12 grossly intact, strength intact Extremities: Perfused, no clubbing Skin: Normal skin turgor, no notable skin lesions seen Psychiatry: Mood normal // no visual hallucinations   Data Reviewed:  Labs reviewed: Na 138, K 4.1, Cr 0.74, WBC 12.4, Hgb 12.1  Family Communication: pt in room, family not at bedside  Disposition: Status is: Inpatient Remains inpatient appropriate because: severity of illness  Planned Discharge Destination: Rehab     Author: Rickey Barbara, MD 04/17/2023 4:26 PM  For on call review [email or web address].

## 2023-04-17 NOTE — Progress Notes (Signed)
CARDIAC REHAB PHASE I   Post MI education including site care, restrictions, risk factors, MI booklet, heart healthy diabetic diet and CRP2 reviewed. Referral for CRP2 sent to [facility] per protocol. Pt has various co-morbidities that will most likely prevent participation with out patient CRP2 services. Workup for Rehab at discharge. PT/OT assisting with mobility needs. CRP1 will sign off for now. CRP1 available for consult if further needs arise this admission.  [id number]    Woodroe Chen, RN BSN 04/17/2023 10:16 AM

## 2023-04-17 NOTE — Progress Notes (Signed)
Inpatient Rehab Admissions:  Inpatient Rehab Consult received.  I met with patient at the bedside for rehabilitation assessment and to discuss goals and expectations of an inpatient rehab admission.  Discussed average length of stay, insurance authorization requirement and discharge home after completion of CIR. Pt acknowledged understanding and would like to pursue CIR. Pt gave permission to contact wife. Spoke with pt's wife Kathie Rhodes on the telephone. She also acknowledged understanding of CIR goals and expectations. She is supportive of pt pursuing CIR. She confirmed that she will be able to provide 24/7 support for pt after discharge. Will continue to follow pt's progress with therapies and medical workup.  Signed: Wolfgang Phoenix, MS, CCC-SLP Admissions Coordinator [phone]

## 2023-04-17 NOTE — PMR Pre-admission (Incomplete)
PMR Admission Coordinator Pre-Admission Assessment  Patient: Michael Rose is an 72 y.o., male MRN: [id number] DOB: December 02, 1950 Height: 5\' 11"  (180.3 cm) Weight: 97.4 kg  Insurance Information HMO: ***    PPO:      PCP:      IPA:      80/20:      OTHER:  PRIMARY: [employer]      Policy#: [id number]      Subscriber: patient CM Name: ***      Phone#: ***     Fax#: *** Pre-Cert#: ***      Employer: *** Benefits:  Phone #: ***     Name: *** Dolores Hoose. Date: ***     Deduct: ***      Out of Pocket Max: ***      Life Max: *** CIR: ***      SNF: *** Outpatient: ***     Co-Pay: *** Home Health: ***      Co-Pay: *** DME: ***     Co-Pay: *** Providers: in-network SECONDARY:       Policy#:      Phone#:   Financial Counselor:       Phone#:   The Data processing manager" for patients in Inpatient Rehabilitation Facilities with attached "Privacy Act Statement-Health Care Records" was provided and verbally reviewed with: {CHL IP Patient Family VZ:[id number]}  Emergency Contact Information Contact Information     Name Relation Home Work Mobile   [place] Spouse [phone]  [phone]      Other Contacts     Name Relation Home Work Mobile   [place] Daughter [phone]  [phone]       Current Medical History  Patient Admitting Diagnosis: cardiac debility History of Present Illness: *** Complete NIHSS TOTAL: 7  Patient's medical record from [facility] has been reviewed by the rehabilitation admission coordinator and physician.  Past Medical History  Past Medical History:  Diagnosis Date   Allergic reaction to contrast dye    Bipolar affective disorder (HCC)    Coronary atherosclerosis of native coronary artery    BMS to RCA 2008, [facility], Dr. Katrinka Blazing   Diabetes mellitus without complication (HCC)    GERD (gastroesophageal reflux disease)    Hard of hearing    Right ear   Hyperlipidemia    Hypertension    Myocardial infarction  [facility])    IMI 2008   Osteoarthritis    Ringing in right ear     Has the patient had major surgery during 100 days prior to admission? No  Family History   family history includes Heart disease in his father and mother.  Current Medications  Current Facility-Administered Medications:    acetaminophen (TYLENOL) tablet 650 mg, 650 mg, Oral, Q4H PRN, [name], MD, 650 mg at 04/16/23 1303   amoxicillin-clavulanate (AUGMENTIN) 875-125 MG per tablet 1 tablet, 1 tablet, Oral, Q12H, Meuth, Brooke A, PA-C, 1 tablet at 04/17/23 7628   aspirin EC tablet 81 mg, 81 mg, Oral, Daily, [name], MD, 81 mg at 04/17/23 0932   atorvastatin (LIPITOR) tablet 80 mg, 80 mg, Oral, Daily, Marcelino Duster, [place], 80 mg at 04/17/23 0932   clopidogrel (PLAVIX) tablet 75 mg, 75 mg, Oral, Daily, Hilty, Lisette Abu, MD, 75 mg at 04/17/23 0932   diclofenac Sodium (VOLTAREN) 1 % topical gel 2 g, 2 g, Topical, QID, [name], MD, 2 g at 04/17/23 1500   diphenhydrAMINE (BENADRYL) injection 25 mg, 25 mg, Intravenous, Once PRN, Meuth, Brooke A,  PA-C   doxycycline (VIBRA-TABS) tablet 100 mg, 100 mg, Oral, Q12H, Meuth, Brooke A, PA-C, 100 mg at 04/17/23 0932   EPINEPHrine (EPI-PEN) injection 0.3 mg, 0.3 mg, Intramuscular, Once PRN, Meuth, Brooke A, PA-C   feeding supplement (GLUCERNA SHAKE) (GLUCERNA SHAKE) liquid 237 mL, 237 mL, Oral, TID BM, [name], MD, 237 mL at 04/17/23 0939   fluvoxaMINE (LUVOX) tablet 200 mg, 200 mg, Oral, Daily, [name], MD, 200 mg at 04/17/23 4782   heparin injection 5,000 Units, 5,000 Units, Subcutaneous, Q8H, Hilty, Lisette Abu, MD, 5,000 Units at 04/17/23 1500   HYDROcodone-acetaminophen (NORCO) 7.5-325 MG per tablet 1 tablet, 1 tablet, Oral, Q6H PRN, [name], MD, 1 tablet at 04/17/23 0825   insulin aspart (novoLOG) injection 0-15 Units, 0-15 Units, Subcutaneous, TID WC, [name], MD, 3 Units at 04/17/23 1248   insulin aspart (novoLOG) injection 0-5  Units, 0-5 Units, Subcutaneous, QHS, [name], MD   insulin aspart (novoLOG) injection 4 Units, 4 Units, Subcutaneous, TID WC, [name], MD, 4 Units at 04/17/23 1206   insulin glargine-yfgn (SEMGLEE) injection 10 Units, 10 Units, Subcutaneous, BID, Marinda Elk, MD, 10 Units at 04/17/23 9562   melatonin tablet 10 mg, 10 mg, Oral, QHS, [name], MD, 10 mg at 04/16/23 2244   metoprolol succinate (TOPROL-XL) 24 hr tablet 100 mg, 100 mg, Oral, Daily, [name], MD, 100 mg at 04/17/23 0932   nitroGLYCERIN (NITROSTAT) SL tablet 0.4 mg, 0.4 mg, Sublingual, Q5 Min x 3 PRN, [name], MD   ondansetron [facility]) injection 4 mg, 4 mg, Intravenous, Q6H PRN, [name], MD   pantoprazole (PROTONIX) EC tablet 40 mg, 40 mg, Oral, Daily, [name], MD, 40 mg at 04/17/23 0932   polyethylene glycol (MIRALAX / GLYCOLAX) packet 17 g, 17 g, Oral, Daily PRN, [name], MD   QUEtiapine (SEROQUEL) tablet 25 mg, 25 mg, Oral, QHS, [name], MD, 25 mg at 04/16/23 2332   sodium chloride flush (NS) 0.9 % injection 3 mL, 3 mL, Intravenous, Q12H, [name], MD, 3 mL at 04/17/23 0939   sodium chloride flush (NS) 0.9 % injection 3 mL, 3 mL, Intravenous, PRN, [name], MD   traZODone (DESYREL) tablet 150 mg, 150 mg, Oral, Mickel Crow, MD, 150 mg at 04/16/23 2332  Patients Current Diet:  Diet Order             Diet Carb Modified Fluid consistency: Thin; Room service appropriate? Yes  Diet effective now                   Precautions / Restrictions Precautions Precautions: Fall Precaution Comments: very anxious, L knee buckles Restrictions Weight Bearing Restrictions: No   Has the patient had 2 or more falls or a fall with injury in the past year? Yes  Prior Activity Level Limited Community (1-2x/wk): doctors' appointments  Prior Functional Level Self Care: Did the patient need help bathing, dressing, using the toilet or eating?  Needed some help  Indoor Mobility: Did the patient need assistance with walking from room to room (with or without device)? Independent  Stairs: Did the patient need assistance with internal or external stairs (with or without device)? Independent  Functional Cognition: Did the patient need help planning regular tasks such as shopping or remembering to take medications? Needed some help  Patient Information Are you of Hispanic, Latino/a,or Spanish origin?: A. No, not of Hispanic, Latino/a, or Spanish origin What is your race?: A. White Do you need or want  an interpreter to communicate with a doctor or health care staff?: 0. No  Patient's Response To:  Health Literacy and Transportation Is the patient able to respond to health literacy and transportation needs?: Yes Health Literacy - How often do you need to have someone help you when you read instructions, pamphlets, or other written material from your doctor or pharmacy?: Always In the past 12 months, has lack of transportation kept you from medical appointments or from getting medications?: No In the past 12 months, has lack of transportation kept you from meetings, work, or from getting things needed for daily living?: No  Home Assistive Devices / Equipment Home Equipment: Agricultural consultant (2 wheels), [employer] - single point, Wheelchair - manual, Tub bench, Hand held shower head, BSC/3in1  Prior Device Use: Indicate devices/aids used by the patient prior to current illness, exacerbation or injury? Manual wheelchair and Walker  Current Functional Level Cognition  Overall Cognitive Status: Difficult to assess Current Attention Level: Focused Orientation Level: Oriented X4 Following Commands: Follows one step commands consistently, Follows one step commands with increased time Safety/Judgement: Decreased awareness of safety, Decreased awareness of deficits General Comments: Pt very quick to initiate movements before instructions are given  leading to increased anxiety. Pt requires heavy encouragement and reassurance    Extremity Assessment (includes Sensation/Coordination)  Upper Extremity Assessment: Right hand dominant, LUE deficits/detail, RUE deficits/detail RUE Deficits / Details: Hand strength 4-/5, all other strength largerly 4+/5; ROM WFL; decreased fine motor coordination; pt reports numbness on ulnar side of hand since recent fall when he landed on his hand - MD aware RUE Sensation: decreased light touch RUE Coordination: decreased fine motor LUE Deficits / Details: fine motor coordination impaired. reports increased weakness on this side but unable to fully assess today LUE Sensation: WNL LUE Coordination: decreased fine motor  Lower Extremity Assessment: Defer to PT evaluation RLE Deficits / Details: grossly 3/5 or better for LE strength; heel to shin intact. RLE Coordination: WNL LLE Deficits / Details: 2-/5 or less for LE strength with hip flex, knee flex/ext, ankle Df/Pf. impaired heel to shin. LLE Coordination: decreased fine motor, decreased gross motor    ADLs  Overall ADL's : Needs assistance/impaired Eating/Feeding: Set up, Sitting Grooming: Minimal assistance, Sitting, Brushing hair Grooming Details (indicate cue type and reason): wetting hair with washcloth and combing. able to use RUE to assist with task Upper Body Bathing: Set up, Bed level Upper Body Bathing Details (indicate cue type and reason): able to easily reach majority of areas bed level. declined EOB Lower Body Bathing: Maximal assistance, Sitting/lateral leans, Cueing for sequencing, Cueing for compensatory techniques (with increased time) Lower Body Bathing Details (indicate cue type and reason): simulated Upper Body Dressing : Minimal assistance, Cueing for sequencing, Sitting (with increased time) Lower Body Dressing: Total assistance, Bed level Toilet Transfer: Maximal assistance, +2 for physical assistance, +2 for safety/equipment,  Squat-pivot, BSC/3in1 Toilet Transfer Details (indicate cue type and reason): RN and OT transferring pt from recliner to [facility] on R side with LLE lagging behind. cues for hand placement Toileting- Clothing Manipulation and Hygiene: Total assistance, +2 for physical assistance, +2 for safety/equipment, Sitting/lateral lean, Sit to/from stand Toileting - Clothing Manipulation Details (indicate cue type and reason): RN and OT assisting pt in standing with L knee blocked while nursing student assisting with peri care. General ADL Comments: Emphasis on gentle AROM bed level, therapeutic exercises bed level to assist in preventing further weakness due to decreased OOB activity    Mobility  Overal bed mobility: Needs Assistance Bed Mobility: Sit to Supine, Supine to Sit Rolling: Max assist, Used rails Supine to sit: Min assist Sit to supine: Contact guard assist General bed mobility comments: Min A for BLE management due to pt attempting to elevate trunk from sidelying with BLE still on the bed despite cues. Good power up to sitting using BUE. Pt with decreased control of descent back to supine, but is able to elevate BLE to EOB    Transfers  Overall transfer level: Needs assistance Equipment used: Rolling walker (2 wheels) Transfers: Sit to/from Stand Sit to Stand: Min assist, +2 physical assistance Bed to/from chair/wheelchair/BSC transfer type:: Squat pivot Squat pivot transfers: Max assist, +2 physical assistance, +2 safety/equipment Transfer via Lift Equipment: Stedy General transfer comment: from elevated EOB with min A +2 and pt pulling up on RW. Good power up to stand, however L knee buckles in standing requiring assist and dense cues for pt to achieve upright posture and remain standing. Pt able to briefly improve with cues for increased BUE support on RW, however pt quickly stating need to sit. Unable to attempt steps due to LLE instability and pt's anxiety    Ambulation / Gait / Stairs /  Wheelchair Mobility  Ambulation/Gait Pre-gait activities: Stood with walker <5 sec with +2 mod assist. Stood with Stedy x 60 sec with min assist for pericare    Posture / Balance Dynamic Sitting Balance Sitting balance - Comments: sitting EOB with UE support Balance Overall balance assessment: Needs assistance Sitting-balance support: Feet supported, Bilateral upper extremity supported Sitting balance-Leahy Scale: Fair Sitting balance - Comments: sitting EOB with UE support Standing balance support: Bilateral upper extremity supported, During functional activity, Reliant on assistive device for balance Standing balance-Leahy Scale: Poor Standing balance comment: Pt reliant on RW and assist to maintain balance    Special needs/care consideration Skin Non-pressure wound: sacrum; Erythema/Redness: buttocks, groin/bilateral, Bladder and Bowel incontinence, External Urinary Catheter and Diabetic management Novolog 0-15 units 3x daily with meals; Novolog 0-5 units daily at bedtime; Novolog 4 units 3x daily with meals; Semglee 10 units 2x daily   Previous Home Environment (from acute therapy documentation) Living Arrangements: Spouse/significant other  Lives With: Spouse Available Help at Discharge: Family, Available 24 hours/day Type of Home: Apartment Home Layout: One level Home Access: Stairs to enter Entrance Stairs-Rails: Can reach both Entrance Stairs-Number of Steps: 3 Bathroom Shower/Tub: Health visitor: Standard Bathroom Accessibility: Yes How Accessible: Accessible via walker Home Care Services: Yes Type of Home Care Services: Home PT Home Care Agency (if known): [facility]  Discharge Living Setting Plans for Discharge Living Setting: Patient's home Type of Home at Discharge: Apartment Discharge Home Layout: One level Discharge Home Access: Stairs to enter Entrance Stairs-Rails: Can reach both Entrance Stairs-Number of Steps: 3 Discharge Bathroom  Shower/Tub: Walk-in shower Discharge Bathroom Toilet: Standard Discharge Bathroom Accessibility: Yes How Accessible: Accessible via walker Does the patient have any problems obtaining your medications?: Yes (Describe) (expensive)  Social/Family/Support Systems Anticipated Caregiver: Kathie Rhodes, wife Anticipated Caregiver's Contact Information: [phone] Caregiver Availability: 24/7 Discharge Plan Discussed with Primary Caregiver: Yes Is Caregiver In Agreement with Plan?: Yes Does Caregiver/Family have Issues with Lodging/Transportation while Pt is in Rehab?: No  Goals Patient/Family Goal for Rehab: *** Expected length of stay: *** Pt/Family Agrees to Admission and willing to participate: Yes Program Orientation Provided & Reviewed with Pt/Caregiver Including Roles  & Responsibilities: Yes  Decrease burden of Care through IP rehab admission: NA  Possible need for  SNF placement upon discharge: Not anticipated  Patient Condition: {PATIENT'S CONDITION:22832}  Preadmission Screen Completed By:  Domingo Pulse, 04/17/2023 4:05 PM ______________________________________________________________________   Discussed status with Dr. Marland Kitchen on *** at *** and received approval for admission today.  Admission Coordinator:  Domingo Pulse, CCC-SLP, time ***/Date ***   Assessment/Plan: Diagnosis: Does the need for close, 24 hr/day Medical supervision in concert with the patient's rehab needs make it unreasonable for this patient to be served in a less intensive setting? {yes_no_potentially:3041433} Co-Morbidities requiring supervision/potential complications: *** Due to {due ZO:1096045}, does the patient require 24 hr/day rehab nursing? {yes_no_potentially:3041433} Does the patient require coordinated care of a physician, rehab nurse, PT, OT, and SLP to address physical and functional deficits in the context of the above medical diagnosis(es)? {yes_no_potentially:3041433} Addressing  deficits in the following areas: {deficits:3041436} Can the patient actively participate in an intensive therapy program of at least 3 hrs of therapy 5 days a week? {yes_no_potentially:3041433} The potential for patient to make measurable gains while on inpatient rehab is {potential:3041437} Anticipated functional outcomes upon discharge from inpatient rehab: {functional outcomes:304600100} PT, {functional outcomes:304600100} OT, {functional outcomes:304600100} SLP Estimated rehab length of stay to reach the above functional goals is: *** Anticipated discharge destination: {anticipated dc setting:21604} 10. Overall Rehab/Functional Prognosis: {potential:3041437}   MD Signature: ***

## 2023-04-18 DIAGNOSIS — I214 Non-ST elevation (NSTEMI) myocardial infarction: Secondary | ICD-10-CM | POA: Diagnosis not present

## 2023-04-18 LAB — CBC
HCT: 38.3 % — ABNORMAL LOW (ref 39.0–52.0)
Hemoglobin: 12.7 g/dL — ABNORMAL LOW (ref 13.0–17.0)
MCH: 25.3 pg — ABNORMAL LOW (ref 26.0–34.0)
MCHC: 33.2 g/dL (ref 30.0–36.0)
MCV: 76.4 fL — ABNORMAL LOW (ref 80.0–100.0)
Platelets: 239 10*3/uL (ref 150–400)
RBC: 5.01 MIL/uL (ref 4.22–5.81)
RDW: 16.8 % — ABNORMAL HIGH (ref 11.5–15.5)
WBC: 11.6 10*3/uL — ABNORMAL HIGH (ref 4.0–10.5)
nRBC: 0 % (ref 0.0–0.2)

## 2023-04-18 LAB — COMPREHENSIVE METABOLIC PANEL
ALT: 24 U/L (ref 0–44)
AST: 17 U/L (ref 15–41)
Albumin: 2.5 g/dL — ABNORMAL LOW (ref 3.5–5.0)
Alkaline Phosphatase: 77 U/L (ref 38–126)
Anion gap: 9 (ref 5–15)
BUN: 15 mg/dL (ref 8–23)
CO2: 22 mmol/L (ref 22–32)
Calcium: 8.7 mg/dL — ABNORMAL LOW (ref 8.9–10.3)
Chloride: 107 mmol/L (ref 98–111)
Creatinine, Ser: 0.73 mg/dL (ref 0.61–1.24)
GFR, Estimated: >60 mL/min (ref >60)
Glucose, Bld: 132 mg/dL — ABNORMAL HIGH (ref 70–99)
Potassium: 4.3 mmol/L (ref 3.5–5.1)
Sodium: 138 mmol/L (ref 135–145)
Total Bilirubin: 0.9 mg/dL (ref <1.2)
Total Protein: 6 g/dL — ABNORMAL LOW (ref 6.5–8.1)

## 2023-04-18 LAB — GLUCOSE, CAPILLARY
Glucose-Capillary: 144 mg/dL — ABNORMAL HIGH (ref 70–99)
Glucose-Capillary: 148 mg/dL — ABNORMAL HIGH (ref 70–99)
Glucose-Capillary: 140 mg/dL — ABNORMAL HIGH (ref 70–99)
Glucose-Capillary: 98 mg/dL (ref 70–99)

## 2023-04-18 NOTE — Plan of Care (Signed)
  Problem: Education: Goal: Knowledge of General Education information will improve Description: Including pain rating scale, medication(s)/side effects and non-pharmacologic comfort measures Outcome: Progressing   Problem: Health Behavior/Discharge Planning: Goal: Ability to manage health-related needs will improve Outcome: Progressing   Problem: Clinical Measurements: Goal: Ability to maintain clinical measurements within normal limits will improve Outcome: Progressing Goal: Will remain free from infection Outcome: Progressing Goal: Diagnostic test results will improve Outcome: Progressing Goal: Respiratory complications will improve Outcome: Progressing Goal: Cardiovascular complication will be avoided Outcome: Progressing   Problem: Activity: Goal: Risk for activity intolerance will decrease Outcome: Progressing   Problem: Nutrition: Goal: Adequate nutrition will be maintained Outcome: Progressing   Problem: Coping: Goal: Level of anxiety will decrease Outcome: Progressing   Problem: Elimination: Goal: Will not experience complications related to bowel motility Outcome: Progressing Goal: Will not experience complications related to urinary retention Outcome: Progressing   Problem: Pain Management: Goal: General experience of comfort will improve Outcome: Progressing   Problem: Safety: Goal: Ability to remain free from injury will improve Outcome: Progressing   Problem: Skin Integrity: Goal: Risk for impaired skin integrity will decrease Outcome: Progressing   Problem: Education: Goal: Understanding of cardiac disease, CV risk reduction, and recovery process will improve Outcome: Progressing Goal: Individualized Educational Video(s) Outcome: Progressing   Problem: Activity: Goal: Ability to tolerate increased activity will improve Outcome: Progressing   Problem: Cardiac: Goal: Ability to achieve and maintain adequate cardiovascular perfusion will  improve Outcome: Progressing   Problem: Health Behavior/Discharge Planning: Goal: Ability to safely manage health-related needs after discharge will improve Outcome: Progressing   Problem: Education: Goal: Ability to describe self-care measures that may prevent or decrease complications (Diabetes Survival Skills Education) will improve Outcome: Progressing Goal: Individualized Educational Video(s) Outcome: Progressing   Problem: Coping: Goal: Ability to adjust to condition or change in health will improve Outcome: Progressing   Problem: Fluid Volume: Goal: Ability to maintain a balanced intake and output will improve Outcome: Progressing   Problem: Health Behavior/Discharge Planning: Goal: Ability to identify and utilize available resources and services will improve Outcome: Progressing Goal: Ability to manage health-related needs will improve Outcome: Progressing   Problem: Metabolic: Goal: Ability to maintain appropriate glucose levels will improve Outcome: Progressing   Problem: Nutritional: Goal: Maintenance of adequate nutrition will improve Outcome: Progressing Goal: Progress toward achieving an optimal weight will improve Outcome: Progressing   Problem: Skin Integrity: Goal: Risk for impaired skin integrity will decrease Outcome: Progressing   Problem: Tissue Perfusion: Goal: Adequacy of tissue perfusion will improve Outcome: Progressing   Problem: Education: Goal: Understanding of CV disease, CV risk reduction, and recovery process will improve Outcome: Progressing Goal: Individualized Educational Video(s) Outcome: Progressing   Problem: Activity: Goal: Ability to return to baseline activity level will improve Outcome: Progressing   Problem: Cardiovascular: Goal: Ability to achieve and maintain adequate cardiovascular perfusion will improve Outcome: Progressing Goal: Vascular access site(s) Level 0-1 will be maintained Outcome: Progressing    Problem: Health Behavior/Discharge Planning: Goal: Ability to safely manage health-related needs after discharge will improve Outcome: Progressing

## 2023-04-18 NOTE — Progress Notes (Signed)
Progress Note   Patient: Michael Rose UXN:[id number] DOB: [birth year] DOA: 04/09/2023     9 DOS: the patient was seen and examined on 04/18/2023   Brief hospital course: 72 y.o. male past medical history significant for essential hypertension diabetes mellitus type 2 CAD status post PCI, BPH comes in with shortness of breath that started on 04/08/2023, he has been having nonbilious vomiting for about a week prior to admission at [facility] twelve-lead EKG shows sinus tachycardia with diffuse ST segment depression troponins doubled with every repeat last 1 was greater than 3000 chest x-ray showed vascular congestion with right pleural effusion, white count of 17 was transferred to [facility]. 2D echo was done that showed an EF of 40% with regional wall motion abnormality, small inferoapical area which appeared to be frankly dyskinetic.   Heart cath revealed severe multivessel disease with CT surgery consulted for consideration for cabg. 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 Cardiology consulted left heart cath on 04/12/2023, showed severe left main disease 80 to 90%. CT surgery was consulted and following. Deciding on options while below CVA is being addressed by Neurology -Per Cardiology, not CABG candidate at this point. Pt also not recommended for high-risk intervention. Heparin has been d/c'd -Pt now back on DAPT. 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 Pt was on heparin with ASA 81mg  and plavix DAPT -Heparin stopped by Cardiology per above -Neurology to f/u with [facility] in 4 weeks after d/c -Possible CIR pending    Leukocytosis/watery bowel movement: Has remained afebrile No further diarrhea, Tmax was 100.2 respiratory panel was negative. Leukocytosis resolved.  Likely due to steroids   Essential hypertension: Continue metoprolol and Imdur.   Iron deficiency anemia: Will need follow-up with PCP as an outpatient. Patient not remember when was his last colonoscopy.   Hyperlipidemia Continue statins.   Type 2 diabetes mellitus without complication, without long-term current use of insulin (HCC): He received steroids single dose, which makes his blood glucose erratic. Increase long-acting insulin continue sliding scale CBGs before meals and at bedtime.    Obesity: Noted has been counseled.   Anxiety/depression: Continue trazodone and fluoxetine   Benign prostatic hyperplasia Cont home meds  Sacral non-pressure wound present on admit Wound / Incision (Open or Dehisced) 04/14/23 Non-pressure wound Sacrum Red draining full thickness (Active)  Date First Assessed/Time First Assessed: 04/14/23 1003   Wound Type: Non-pressure wound  Location: Sacrum  Wound Description (Comments): Red draining full thickness  Present on Admission: Yes    Assessments 04/13/2023  8:26 PM 04/17/2023  8:30 PM  Dressing Type -- Foam - Lift dressing to assess site every shift  Dressing Changed -- Changed  Dressing Status -- Clean, Dry, Intact  Dressing Change Frequency -- Twice a day  Wound Length (cm) 0.3 cm --  Wound Width (cm) 0.3 cm --  Wound Depth (cm) 2 cm --  Wound Volume (cm^3) 0.18 cm^3 --  Wound Surface Area (cm^2) 0.09 cm^2 --  Margins -- Attached edges (approximated)  Closure -- None  Drainage Amount -- Moderate  Drainage Description -- Serosanguineous  Treatment -- Cleansed;Packing (Dry gauze)     No associated orders.  Subjective:Reports feeling better.   Physical Exam: Vitals:   04/17/23 1259 04/17/23 1918 04/18/23 0519 04/18/23 1425  BP: 137/82 125/80 (!) 150/93 (!) 149/85  Pulse:  84 81 86 (!) 102  Resp: 18 20 16 18   Temp: (!) 97.5 F (36.4 C) 98.2 F (36.8 C) 98.4 F (36.9 C)   TempSrc: Oral Oral Oral   SpO2: 96% 99% 96% 99%  Weight:      Height:       General exam: Conversant, in no acute distress Respiratory system: normal chest rise, clear, no audible wheezing Cardiovascular system: regular rhythm, s1-s2 Gastrointestinal system: Nondistended, nontender, pos BS Central nervous system: No seizures, no tremors Extremities: No cyanosis, no joint deformities Skin: No rashes, no pallor Psychiatry: Affect normal // no auditory hallucinations   Data Reviewed:  Labs reviewed: Na 138, K 4.3, Cr 0.73, WBC 11.6, Hgb 12.7, Plts 16.8  Family Communication: pt in room, family not at bedside  Disposition: Status is: Inpatient Remains inpatient appropriate because: severity of illness  Planned Discharge Destination: Rehab     Author: Rickey Barbara, MD 04/18/2023 2:50 PM  For on call review [email or web address].

## 2023-04-18 NOTE — Progress Notes (Signed)
Patient stated that he has access to weapons (357 magnum ) that he has a collection of guns. He also stated later on that he has an ex-wife and family members that would love to see him dead so why continue to live. When asked to explain, patient stated "It is miserable in my situation. I can just use one of my guns."   Charge nurse notified and asked to notify MD. Patient further voiced to CNA that  " I Just want to Die!" The CNA reported this to the Clinical research associate.

## 2023-04-19 ENCOUNTER — Ambulatory Visit: Payer: Medicare PPO | Admitting: Orthopedic Surgery

## 2023-04-19 ENCOUNTER — Other Ambulatory Visit (HOSPITAL_COMMUNITY): Payer: Self-pay

## 2023-04-19 DIAGNOSIS — I214 Non-ST elevation (NSTEMI) myocardial infarction: Secondary | ICD-10-CM | POA: Diagnosis not present

## 2023-04-19 LAB — CBC
HCT: 42 % (ref 39.0–52.0)
Hemoglobin: 13 g/dL (ref 13.0–17.0)
MCH: 24.3 pg — ABNORMAL LOW (ref 26.0–34.0)
MCHC: 31 g/dL (ref 30.0–36.0)
MCV: 78.7 fL — ABNORMAL LOW (ref 80.0–100.0)
Platelets: 279 10*3/uL (ref 150–400)
RBC: 5.34 MIL/uL (ref 4.22–5.81)
RDW: 16.9 % — ABNORMAL HIGH (ref 11.5–15.5)
WBC: 10.9 10*3/uL — ABNORMAL HIGH (ref 4.0–10.5)
nRBC: 0 % (ref 0.0–0.2)

## 2023-04-19 LAB — GLUCOSE, CAPILLARY
Glucose-Capillary: 130 mg/dL — ABNORMAL HIGH (ref 70–99)
Glucose-Capillary: 145 mg/dL — ABNORMAL HIGH (ref 70–99)
Glucose-Capillary: 115 mg/dL — ABNORMAL HIGH (ref 70–99)
Glucose-Capillary: 159 mg/dL — ABNORMAL HIGH (ref 70–99)

## 2023-04-19 LAB — TSH: TSH: 2.473 u[IU]/mL (ref 0.350–4.500)

## 2023-04-19 LAB — VITAMIN B12: Vitamin B-12: 269 pg/mL (ref 180–914)

## 2023-04-19 LAB — FOLATE: Folate: 18.8 ng/mL (ref >5.9)

## 2023-04-19 MED ORDER — FLUVOXAMINE MALEATE 100 MG PO TABS
100.0000 mg | ORAL_TABLET | Freq: Every day | ORAL | Status: DC
  Administered 2023-04-20 – 2023-04-22 (×3): 100 mg via ORAL
  Filled 2023-04-19 (×3): qty 1

## 2023-04-19 MED ORDER — ALUM & MAG HYDROXIDE-SIMETH 200-200-20 MG/5ML PO SUSP
15.0000 mL | Freq: Four times a day (QID) | ORAL | Status: DC | PRN
  Administered 2023-04-19: 15 mL via ORAL
  Filled 2023-04-19: qty 30

## 2023-04-19 MED ORDER — OMEGA-3-ACID ETHYL ESTERS 1 G PO CAPS
1.0000 g | ORAL_CAPSULE | Freq: Two times a day (BID) | ORAL | Status: DC
  Administered 2023-04-19 – 2023-04-22 (×7): 1 g via ORAL
  Filled 2023-04-19 (×7): qty 1

## 2023-04-19 NOTE — TOC Benefit Eligibility Note (Signed)
Patient Product/process development scientist completed.    The patient is insured through [employer]/ [employer]. Patient has Medicare and is not eligible for a copay card, but may be able to apply for patient assistance, if available.    Ran test claim for omega-3 acid ethyl esters (Lovaza) 1 g capsules and the current 30 day co-pay is $4.50.   This test claim was processed through [facility]- copay amounts may vary at other pharmacies due to pharmacy/plan contracts, or as the patient moves through the different stages of their insurance plan.     Roland Earl, CPHT Pharmacy Technician III Certified Patient Advocate [facility] Patient Advocate Team Direct Number: [phone]  Fax: [phone]

## 2023-04-19 NOTE — Progress Notes (Signed)
Physical Therapy Treatment Patient Details Name: Michael Rose MRN: [id number] DOB: 1950-10-15 Today's Date: 04/19/2023   History of Present Illness Pt is 72 year old presented to [facility] on  04/09/23 from [facility] for likely viral gastroenteritis and encephalopathy in setting on profound hypokalemia. Pt with elevated troponin and unclear if NSTEMI vs demand ischemia. Cardiology consulted and underwent cardiac cath on 11/11. On 04/13/23 pt developed new onset of Lt facial droop, Lt UE ataxia, and slurred speech. 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    PT Comments  Pt admitted with above diagnosis. Pt was able to stand to Stedy x 3 x with mod assist and +2 for safety.  Pt with difficulty standing fully upright. Also pt was orthostatic and dizzy therefore ended up lying pt back down.  Wife present.  Discussed d/c plan and that PT recommends post acute REhab < 3 hours day. Will continue to follow acutely.  Pt currently with functional limitations due to the deficits listed below (see PT Problem List). Pt will benefit from acute skilled PT to increase their independence and safety with mobility to allow discharge.     Orthostatic BPs  Supine 117/84, 81 bpm  Sitting 101/87, 94 bpm  Standing 93/80, 90 bpm  Standing after 3 min Too dizzy to obtain     BP at end of treatment 133/99 with pt back in supine position.   If plan is discharge home, recommend the following: Two people to help with walking and/or transfers;Two people to help with bathing/dressing/bathroom;Assistance with cooking/housework;Assist for transportation;Supervision due to cognitive status;Help with stairs or ramp for entrance;Assistance with feeding   Can travel by private vehicle     No  Equipment Recommendations  None recommended by  PT    Recommendations for Other Services Rehab consult (if pt progresses tolerance may be candidate new CVA)     Precautions / Restrictions Precautions Precautions: Fall Precaution Comments: very anxious, L knee buckles Restrictions Weight Bearing Restrictions: No     Mobility  Bed Mobility Overal bed mobility: Needs Assistance Bed Mobility: Supine to Sit, Sit to Supine     Supine to sit: Min assist Sit to supine: Mod assist   General bed mobility comments: Light assist to lift trunk to EOB but good effort on pt part with bedrails. returned to supine impulsively posing safety concern, Mod A forLE into bed    Transfers Overall transfer level: Needs assistance Equipment used: Ambulation equipment used Transfers: Sit to/from Stand Sit to Stand: Mod assist, +2 safety/equipment, +2 physical assistance           General transfer comment: to stand in [place], poor upright posturing of trunk, impulsive with movements.  Difficulty standing fully upright.  Orthostatic with dizziness therefore had to lie pt back down.  Recommend nursing get pt up with [place] or Stedy 2 x daily. Transfer via Lift Equipment: Stedy  Ambulation/Gait                   Stairs             Wheelchair Mobility     Tilt Bed    Modified Rankin (Stroke Patients Only) Modified Rankin (Stroke Patients Only) Pre-Morbid Rankin Score: Moderately severe disability Modified Rankin: Severe disability     Balance Overall balance assessment: Needs assistance Sitting-balance  support: Feet supported, Bilateral upper extremity supported Sitting balance-Leahy Scale: Fair Sitting balance - Comments: sitting EOB with UE support   Standing balance support: Bilateral upper extremity supported, During functional activity, Reliant on assistive device for balance Standing balance-Leahy Scale: Poor Standing balance comment: Pt reliant on RW and assist to maintain balance                             Cognition Arousal: Alert Behavior During Therapy: Anxious, Restless Overall Cognitive Status: Difficult to assess Area of Impairment: Attention, Safety/judgement, Awareness, Problem solving, Following commands                   Current Attention Level: Focused   Following Commands: Follows one step commands consistently, Follows one step commands with increased time Safety/Judgement: Decreased awareness of safety, Decreased awareness of deficits Awareness: Emergent, Intellectual Problem Solving: Slow processing, Decreased initiation, Difficulty sequencing, Requires verbal cues General Comments: impulsive with movements, very anxious and limited by this, max encouragement to participate. cues for attention, sequencing and engagement needed.        Exercises General Exercises - Lower Extremity Ankle Circles/Pumps: AROM, Both, 10 reps, Supine Long Arc Quad: AROM, Both, 10 reps, Seated    General Comments        Pertinent Vitals/Pain Pain Assessment Pain Assessment: Faces Faces Pain Scale: Hurts little more Pain Location: buttock at sacral wound Pain Descriptors / Indicators: Grimacing, Guarding, Sore Pain Intervention(s): Limited activity within patient's tolerance, Monitored during session, Repositioned    Home Living                          Prior Function            PT Goals (current goals can now be found in the care plan section) Acute Rehab PT Goals Patient Stated Goal: get better Progress towards PT goals: Not progressing toward goals - comment (orthostatic and anxiety)    Frequency    Min 1X/week      PT Plan      Co-evaluation              AM-PAC PT "6 Clicks" Mobility   Outcome Measure  Help needed turning from your back to your side while in a flat bed without using bedrails?: A Little Help needed moving from lying on your back to sitting on the side of a flat bed without using bedrails?: A Lot Help needed  moving to and from a bed to a chair (including a wheelchair)?: Total Help needed standing up from a chair using your arms (e.g., wheelchair or bedside chair)?: A Lot Help needed to walk in hospital room?: Total Help needed climbing 3-5 steps with a railing? : Total 6 Click Score: 10    End of Session Equipment Utilized During Treatment: Gait belt Activity Tolerance: Patient limited by fatigue (anxiety) Patient left: in bed;with call bell/phone within reach Nurse Communication: Mobility status;Need for lift equipment PT Visit Diagnosis: Unsteadiness on feet (R26.81);Other abnormalities of gait and mobility (R26.89);Repeated falls (R29.6);Muscle weakness (generalized) (M62.81);Pain;Hemiplegia and hemiparesis Hemiplegia - Right/Left: Left Hemiplegia - dominant/non-dominant: Non-dominant Hemiplegia - caused by: Cerebral infarction Pain - Right/Left: Left Pain - part of body: Ankle and joints of foot;Leg     Time: 1610-9604 PT Time Calculation (min) (ACUTE ONLY): 26 min  Charges:    $Therapeutic Activity: 23-37 mins PT General Charges $$ ACUTE PT VISIT: 1 Visit  [facility] M,PT Acute Rehab Services [phone]    Bevelyn Buckles 04/19/2023, 3:10 PM

## 2023-04-19 NOTE — Progress Notes (Signed)
Occupational Therapy Treatment Patient Details Name: Michael Rose MRN: [id number] DOB: [birth year] Today's Date: 04/19/2023   History of present illness Pt is 72 year old presented to [facility] on  04/09/23 from [facility] for likely viral gastroenteritis and encephalopathy in setting on profound hypokalemia. Pt with elevated troponin and unclear if NSTEMI vs demand ischemia. Cardiology consulted and underwent cardiac cath on 11/11. On 04/13/23 pt developed new onset of Lt facial droop, Lt UE ataxia, and slurred speech. 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   OT comments  Pt with minimal progress towards OT goals today. Pt remains limited by sacral wound discomfort, anxiety and self limiting behaviors at times. Pt continues to be restless and impulsive with movements. Overall, pt requires Mod A for bed mobility, Mod A x 2 to stand in [place] to prevent L knee buckling. Pt participatory in minimal ADLs standing at sink d/t attention deficits and reported discomfort. Returned to bed at end of session as no recliner available in room. Patient will benefit from continued inpatient follow up therapy, <3 hours/day at DC.      If plan is discharge home, recommend the following:  Two people to help with walking and/or transfers;A lot of help with bathing/dressing/bathroom;Assistance with cooking/housework;Assist for transportation;Help with stairs or ramp for entrance;Direct supervision/assist for medications management;Direct supervision/assist for financial management   Equipment Recommendations  Other (comment);Wheelchair cushion (measurements OT) (TBD pending progress; would likely benefit from gel or air w/c cushion for home use)    Recommendations for Other Services      Precautions / Restrictions  Precautions Precautions: Fall Precaution Comments: very anxious, L knee buckles Restrictions Weight Bearing Restrictions: No       Mobility Bed Mobility Overal bed mobility: Needs Assistance Bed Mobility: Supine to Sit, Sit to Supine     Supine to sit: Min assist Sit to supine: Mod assist   General bed mobility comments: Light assist to lift trunk to EOB but good effort on pt part with bedrails. returned to supine impulsively posing safety concern, Mod A forLE into bed    Transfers Overall transfer level: Needs assistance Equipment used: Ambulation equipment used Transfers: Sit to/from Stand Sit to Stand: Mod assist, +2 safety/equipment, +2 physical assistance           General transfer comment: to stand in [place], poor upright posturing of trunk, impulsive with movements     Balance Overall balance assessment: Needs assistance Sitting-balance support: Feet supported, Bilateral upper extremity supported Sitting balance-Leahy Scale: Fair Sitting balance - Comments: sitting EOB with UE support   Standing balance support: Bilateral upper extremity supported, During functional activity, Reliant on assistive device for balance Standing balance-Leahy Scale: Poor                             ADL either performed or assessed with clinical judgement   ADL Overall ADL's : Needs assistance/impaired     Grooming: Standing;Brushing hair Grooming Details (indicate cue type and reason): seated/standing at sink in [place] briefly. requested return to bed due to bottom discomfort         Upper Body Dressing : Minimal assistance;Sitting   Lower Body Dressing: Total assistance;Bed level  General ADL Comments: Hoped to progress standing confidence with Stedy with ADLs at sink but pt briefly participatory. Noted pt recliner missing from room today    Extremity/Trunk Assessment Upper Extremity Assessment Upper Extremity Assessment: Right hand  dominant;RUE deficits/detail;LUE deficits/detail RUE Deficits / Details: Hand strength 4-/5, all other strength largerly 4+/5; ROM WFL; decreased fine motor coordination; pt reports numbness on ulnar side of hand since recent fall when he landed on his hand - MD aware RUE Sensation: decreased light touch RUE Coordination: decreased fine motor LUE Deficits / Details: fine motor coordination impaired. AROM appears WFL and functional throughout LUE Coordination: decreased fine motor   Lower Extremity Assessment Lower Extremity Assessment: Defer to PT evaluation        Vision   Vision Assessment?: Vision impaired- to be further tested in functional context Additional Comments: reported blurriness in L eye with prior session but unable to fully assess due to poor attention   Perception     Praxis      Cognition Arousal: Alert Behavior During Therapy: Anxious, Restless Overall Cognitive Status: Difficult to assess Area of Impairment: Attention, Safety/judgement, Awareness, Problem solving, Following commands                   Current Attention Level: Focused   Following Commands: Follows one step commands consistently, Follows one step commands with increased time Safety/Judgement: Decreased awareness of safety, Decreased awareness of deficits Awareness: Emergent, Intellectual Problem Solving: Slow processing, Decreased initiation, Difficulty sequencing, Requires verbal cues General Comments: impulsive with movements, very anxious and limited by this, max encouragement to participate. cues for attention, sequencing and engagement needed.        Exercises      Shoulder Instructions       General Comments      Pertinent Vitals/ Pain       Pain Assessment Pain Assessment: Faces Faces Pain Scale: Hurts little more Pain Location: buttock at sacral wound Pain Descriptors / Indicators: Grimacing, Guarding, Sore Pain Intervention(s): Monitored during session, Limited  activity within patient's tolerance, RN gave pain meds during session  Home Living                                          Prior Functioning/Environment              Frequency  Min 1X/week        Progress Toward Goals  OT Goals(current goals can now be found in the care plan section)  Progress towards OT goals: OT to reassess next treatment  Acute Rehab OT Goals Patient Stated Goal: decrease pain in bottom, get better OT Goal Formulation: With patient Time For Goal Achievement: 04/24/23 Potential to Achieve Goals: Good ADL Goals Pt Will Perform Grooming: with contact guard assist;standing Pt Will Perform Lower Body Bathing: with contact guard assist;with adaptive equipment;sitting/lateral leans;sit to/from stand Pt Will Perform Lower Body Dressing: with contact guard assist;with adaptive equipment;sitting/lateral leans;sit to/from stand Pt Will Transfer to Toilet: with contact guard assist;ambulating;bedside commode Pt Will Perform Toileting - Clothing Manipulation and hygiene: sitting/lateral leans;sit to/from stand;with min assist Pt/caregiver will Perform Home Exercise Program: Increased strength;With theraputty;With Supervision;With written HEP provided  Plan      Co-evaluation                 AM-PAC OT "6 Clicks" Daily Activity     Outcome Measure  Help from another person eating meals?: A Little Help from another person taking care of personal grooming?: A Little Help from another person toileting, which includes using toliet, bedpan, or urinal?: A Lot Help from another person bathing (including washing, rinsing, drying)?: A Lot Help from another person to put on and taking off regular upper body clothing?: A Little Help from another person to put on and taking off regular lower body clothing?: Total 6 Click Score: 14    End of Session Equipment Utilized During Treatment: Gait belt  OT Visit Diagnosis: Pain;Other (comment);Other  abnormalities of gait and mobility (R26.89);Ataxia, unspecified (R27.0)   Activity Tolerance Other (comment) (limited by anxiety)   Patient Left in bed;with call bell/phone within reach;with nursing/sitter in room   Nurse Communication Mobility status        Time: 3500-9381 OT Time Calculation (min): 23 min  Charges: OT General Charges $OT Visit: 1 Visit OT Treatments $Therapeutic Activity: 8-22 mins  Bradd Canary, OTR/L Acute Rehab Services Office: [phone]   Lorre Munroe 04/19/2023, 10:45 AM

## 2023-04-19 NOTE — Plan of Care (Signed)
  Problem: Education: Goal: Knowledge of General Education information will improve Description: Including pain rating scale, medication(s)/side effects and non-pharmacologic comfort measures Outcome: Progressing   Problem: Health Behavior/Discharge Planning: Goal: Ability to manage health-related needs will improve Outcome: Progressing   Problem: Clinical Measurements: Goal: Ability to maintain clinical measurements within normal limits will improve Outcome: Progressing Goal: Will remain free from infection Outcome: Progressing Goal: Diagnostic test results will improve Outcome: Progressing Goal: Respiratory complications will improve Outcome: Progressing Goal: Cardiovascular complication will be avoided Outcome: Progressing   Problem: Activity: Goal: Risk for activity intolerance will decrease Outcome: Progressing   Problem: Nutrition: Goal: Adequate nutrition will be maintained Outcome: Progressing   Problem: Coping: Goal: Level of anxiety will decrease Outcome: Progressing   Problem: Elimination: Goal: Will not experience complications related to bowel motility Outcome: Progressing Goal: Will not experience complications related to urinary retention Outcome: Progressing   Problem: Pain Management: Goal: General experience of comfort will improve Outcome: Progressing   Problem: Safety: Goal: Ability to remain free from injury will improve Outcome: Progressing   Problem: Skin Integrity: Goal: Risk for impaired skin integrity will decrease Outcome: Progressing   Problem: Education: Goal: Understanding of cardiac disease, CV risk reduction, and recovery process will improve Outcome: Progressing Goal: Individualized Educational Video(s) Outcome: Progressing   Problem: Activity: Goal: Ability to tolerate increased activity will improve Outcome: Progressing   Problem: Cardiac: Goal: Ability to achieve and maintain adequate cardiovascular perfusion will  improve Outcome: Progressing   Problem: Health Behavior/Discharge Planning: Goal: Ability to safely manage health-related needs after discharge will improve Outcome: Progressing   Problem: Education: Goal: Ability to describe self-care measures that may prevent or decrease complications (Diabetes Survival Skills Education) will improve Outcome: Progressing Goal: Individualized Educational Video(s) Outcome: Progressing   Problem: Coping: Goal: Ability to adjust to condition or change in health will improve Outcome: Progressing   Problem: Fluid Volume: Goal: Ability to maintain a balanced intake and output will improve Outcome: Progressing   Problem: Health Behavior/Discharge Planning: Goal: Ability to identify and utilize available resources and services will improve Outcome: Progressing Goal: Ability to manage health-related needs will improve Outcome: Progressing   Problem: Metabolic: Goal: Ability to maintain appropriate glucose levels will improve Outcome: Progressing   Problem: Nutritional: Goal: Maintenance of adequate nutrition will improve Outcome: Progressing Goal: Progress toward achieving an optimal weight will improve Outcome: Progressing   Problem: Skin Integrity: Goal: Risk for impaired skin integrity will decrease Outcome: Progressing   Problem: Tissue Perfusion: Goal: Adequacy of tissue perfusion will improve Outcome: Progressing   Problem: Education: Goal: Understanding of CV disease, CV risk reduction, and recovery process will improve Outcome: Progressing Goal: Individualized Educational Video(s) Outcome: Progressing   Problem: Activity: Goal: Ability to return to baseline activity level will improve Outcome: Progressing   Problem: Cardiovascular: Goal: Ability to achieve and maintain adequate cardiovascular perfusion will improve Outcome: Progressing Goal: Vascular access site(s) Level 0-1 will be maintained Outcome: Progressing    Problem: Health Behavior/Discharge Planning: Goal: Ability to safely manage health-related needs after discharge will improve Outcome: Progressing

## 2023-04-19 NOTE — Progress Notes (Addendum)
Inpatient Rehab Admissions Coordinator:  Note therapy recommendations have changed to SNF. TOC pursuing SNF rehab for pt. Spoke with pt's wife and she is in agreement. AC will sign off.   Wolfgang Phoenix, MS, CCC-SLP Admissions Coordinator [phone]

## 2023-04-19 NOTE — TOC Progression Note (Addendum)
Transition of Care [facility]) - Progression Note    Patient Details  Name: Michael Rose MRN: [id number] Date of Birth: April 14, 1951  Transition of [facility] CM/SW Contact  Delilah Shan, LCSWA Phone Number: 04/19/2023, 2:37 PM  Clinical Narrative:     CSW received message from PT current recs for SNF .Patients spouse wanting SNF bed at [facility] rehab for patient. CSW spoke with Jill Side with [facility] rehab who confirmed patient still has SNF bed there when medically ready. CSW following to start insurance authorization closer to patinet being medically ready for dc. CSW informed MD.   Expected Discharge Plan: Skilled Nursing Facility Barriers to Discharge: Continued Medical Work up  Expected Discharge Plan and Services In-house Referral: Clinical Social Work   Post Acute Care Choice: Skilled Nursing Facility Living arrangements for the past 2 months: Skilled Nursing Facility                                       Social Determinants of Health (SDOH) Interventions SDOH Screenings   Food Insecurity: No Food Insecurity (04/10/2023)  Housing: Low Risk  (04/10/2023)  Transportation Needs: No Transportation Needs (04/10/2023)  Utilities: Not At Risk (04/10/2023)  Financial Resource Strain: High Risk (01/19/2022)  Physical Activity: Insufficiently Active (07/21/2022)   Received [facility], [facility]  Social Connections: Socially Integrated (07/21/2022)   Received from [facility], [facility]  Stress: No Stress Concern Present (07/21/2022)   Received from [facility], [facility]  Tobacco Use: Low Risk  (04/10/2023)  Health Literacy: Low Risk  (07/21/2022)   Received from [facility], [facility]    Readmission Risk Interventions     No data to display

## 2023-04-19 NOTE — Progress Notes (Addendum)
7 Days Post-Op   Subjective/Chief Complaint: No specific complaints   Objective: Vital signs in last 24 hours: Temp:  [97.6 F (36.4 C)-98.6 F (37 C)] 97.6 F (36.4 C) (11/18 0551) Pulse Rate:  [81-102] 81 (11/18 0551) Resp:  [18-20] 20 (11/18 0551) BP: (136-149)/(82-89) 138/82 (11/18 0551) SpO2:  [91 %-99 %] 97 % (11/18 0551) Weight:  [90 kg] 90 kg (11/18 0551) Last BM Date : 04/18/23  Intake/Output from previous day: 11/17 0701 - 11/18 0700 In: 960 [P.O.:960] Out: 4250 [Urine:4250] Intake/Output this shift: No intake/output data recorded.  Skin with < 1 cm opening with some purulent drainage when expressed, some tunneling inferior and to right side, no real cellulitis or fluctuance   Lab Results:  Recent Labs    04/18/23 0446 04/19/23 0732  WBC 11.6* 10.9*  HGB 12.7* 13.0  HCT 38.3* 42.0  PLT 239 279   BMET Recent Labs    04/17/23 0124 04/18/23 0446  NA 138 138  K 4.1 4.3  CL 109 107  CO2 22 22  GLUCOSE 146* 132*  BUN 14 15  CREATININE 0.74 0.73  CALCIUM 8.5* 8.7*   PT/INR No results for input(s): "LABPROT", "INR" in the last 72 hours. ABG No results for input(s): "PHART", "HCO3" in the last 72 hours.  Invalid input(s): "PCO2", "PO2"  Studies/Results: No results found.  Anti-infectives: Anti-infectives (From admission, onward)    Start     Dose/Rate Route Frequency Ordered Stop   04/16/23 1400  ceFEPIme (MAXIPIME) 2 g in sodium chloride 0.9 % 100 mL IVPB  Status:  Discontinued        2 g 200 mL/hr over 30 Minutes Intravenous Every 8 hours 04/16/23 0955 04/16/23 1140   04/16/23 1300  amoxicillin-clavulanate (AUGMENTIN) 875-125 MG per tablet 1 tablet        1 tablet Oral Every 12 hours 04/16/23 1202     04/16/23 1300  doxycycline (VIBRA-TABS) tablet 100 mg        100 mg Oral Every 12 hours 04/16/23 1202     04/16/23 1100  metroNIDAZOLE (FLAGYL) tablet 500 mg  Status:  Discontinued        500 mg Oral Every 12 hours 04/16/23 1008 04/16/23 1140    04/16/23 1045  metroNIDAZOLE (FLAGYL) IVPB 500 mg  Status:  Discontinued        500 mg 100 mL/hr over 60 Minutes Intravenous Every 12 hours 04/16/23 0955 04/16/23 1008       Assessment/Plan:  Sacral wound with abscess - Ongoing purulent drainage but there is no significant overlying cellulitis, fluctuance or induration. Seems to be decompressing well on its own still. Continue bid dressing changes. Dont think needs any more drainage done operatively. Would not recommend any surgical intervention at this point. Given his recent NSTEMI and CVA would be high risk for surgery. Continue antibiotics, wound care.    ID - doxy/augmentin VTE - heparin gtt, plavix FEN - CM diet Foley - none   NSTEMI CAD, multivessel disease, hx PCI 2010 Acute stroke HTN DM HLD  Michael Rose 04/19/2023

## 2023-04-19 NOTE — Progress Notes (Signed)
Progress Note   Patient: Michael Rose WUJ:[id number] DOB: 20-May-1951 DOA: 04/09/2023     10 DOS: the patient was seen and examined on 04/19/2023   Brief hospital course: 72 y.o. male past medical history significant for essential hypertension diabetes mellitus type 2 CAD status post PCI, BPH comes in with shortness of breath that started on 04/08/2023, he has been having nonbilious vomiting for about a week prior to admission at [facility] twelve-lead EKG shows sinus tachycardia with diffuse ST segment depression troponins doubled with every repeat last 1 was greater than 3000 chest x-ray showed vascular congestion with right pleural effusion, white count of 17 was transferred to [facility]. 2D echo was done that showed an EF of 40% with regional wall motion abnormality, small inferoapical area which appeared to be frankly dyskinetic.   Heart cath revealed severe multivessel disease with CT surgery consulted for consideration for cabg. 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 Cardiology consulted left heart cath on 04/12/2023, showed severe left main disease 80 to 90%. CT surgery was consulted and following. Deciding on options while below CVA is being addressed by Neurology -Per Cardiology, not CABG candidate at this point. Pt also not recommended for high-risk intervention. Heparin has been d/c'd -Pt now back on DAPT. Remains stable  Acute R paramedian pons CVA, Acute R cerebellar hemisphere CVA, Acute R post temporal lobe and R occipital lobe CVA -new diagnosis this visit, MRI reviewed -Neurology following -MRI performed -F/u with stroke team. Pt was on heparin with ASA 81mg  and plavix DAPT -Heparin stopped by Cardiology per above -Neurology to f/u with [facility] in 4 weeks after d/c -CIR was initially planned, however now plan  for SNF, TOC following   Leukocytosis/watery bowel movement: Has remained afebrile No further diarrhea, Tmax was 100.2 respiratory panel was negative. Leukocytosis resolved.  Likely due to steroids   Essential hypertension: Continue metoprolol and Imdur.   Iron deficiency anemia: Will need follow-up with PCP as an outpatient. Patient not remember when was his last colonoscopy.   Hyperlipidemia Continue statins.   Type 2 diabetes mellitus without complication, without long-term current use of insulin (HCC): He received steroids single dose, which makes his blood glucose erratic. Increase long-acting insulin continue sliding scale CBGs before meals and at bedtime.    Obesity: Noted has been counseled.   Anxiety/depression: Continue trazodone and fluoxetine   Benign prostatic hyperplasia Cont home meds  Suicidal ideation -See nursing documentation. Pt had voiced thoughts of wanting to die and harming himself -Appreciate input by Psychiatry.  -Psychiatry recommends to decrease fluvoxamine 100mg , trazadone 150mg  at bedtime, stop seroquel, and to start fish oil -Final dispo uncertain at this time, psychiatry to f/u  Sacral non-pressure wound present on admit Wound / Incision (Open or Dehisced) 04/14/23 Non-pressure wound Sacrum Red draining full thickness (Active)  Date First Assessed/Time First Assessed: 04/14/23 1003   Wound Type: Non-pressure wound  Location: Sacrum  Wound Description (Comments): Red draining full thickness  Present on Admission: Yes    Assessments 04/13/2023  8:26 PM 04/19/2023 10:00 AM  Dressing Type -- Abdominal pads;Gauze (Comment)  Dressing Changed -- Changed  Dressing Status -- Clean, Dry, Intact  Dressing Change Frequency -- Twice a day  Site / Wound Assessment -- Pink;Red  Peri-wound Assessment -- Intact  Wound Length (cm) 0.3 cm --  Wound Width (cm)  0.3 cm --  Wound Depth (cm) 2 cm --  Wound Volume (cm^3) 0.18 cm^3 --  Wound Surface Area  (cm^2) 0.09 cm^2 --  Margins -- Unattached edges (unapproximated)  Closure -- None  Drainage Amount -- Moderate  Drainage Description -- Serosanguineous  Treatment -- Cleansed     No associated orders.      Subjective:Without complaints  Physical Exam: Vitals:   04/19/23 0400 04/19/23 0551 04/19/23 0736 04/19/23 1316  BP:  138/82 (!) 148/86 119/65  Pulse:  81 80 79  Resp:  20 20 20   Temp:  97.6 F (36.4 C) (!) 97.5 F (36.4 C)   TempSrc:  Oral Oral   SpO2: 96% 97% 98% 97%  Weight:  90 kg    Height:       General exam: Awake, laying in bed, in nad Respiratory system: Normal respiratory effort, no wheezing Cardiovascular system: regular rate, s1, s2 Gastrointestinal system: Soft, nondistended, positive BS Central nervous system: CN2-12 grossly intact, strength intact Extremities: Perfused, no clubbing Skin: Normal skin turgor, no notable skin lesions seen Psychiatry: Mood normal // no visual hallucinations   Data Reviewed:  Labs reviewed: WBC 10.9, Hgb 13.0, Plts 279  Family Communication: pt in room, family not at bedside  Disposition: Status is: Inpatient Remains inpatient appropriate because: severity of illness  Planned Discharge Destination: Skilled nursing facility     Author: Rickey Barbara, MD 04/19/2023 4:15 PM  For on call review [email or web address].

## 2023-04-19 NOTE — Consult Note (Addendum)
Michael Rose Psychiatry Consult Evaluation  Service Date: April 19, 2023 LOS:  LOS: 10 days    Primary Psychiatric Diagnoses  Demoralization 2.  MDD/recurrent/severe 3.  R/o bipolar d/o  Assessment  Michael Rose is a 72 y.o. 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 Current plan is to maximize medical management. He carries the psychiatric diagnoses of ?depression vs bipolar d/o and has a past medical history of difficulty hearing, arthritis, GERD, DMII, HLD, HTN. Psychiatry was consulted for Suicidal ideation with plan Pt told RN he did not want to live, planned to shoot himself, and that he had a firearm he would use to kill himself with  by Dr. Rhona Leavens.    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 There is some question of hx bipolarity (from wife and EMR) however pt is a poor historian, wife cannot answer any questions about mania, and this dx appears to have been made in pt's 50s or 60s while depressed and suicidal without pt ever being hospitalized psychiatrically; further complicating picture is a couple of encounters (records not available) for OCD and remote hx drug abuse. Only mood stabilizer I could find (aided greatly by NT) was a prescription for rexulti that sounds like it was for steroid induced psychosis and not continued long-term Unclear if we will ever really get a full picture of premorbid psych history, however pt with no s/s of mania on exam 11/18 and appears to have been on fluvoxamine as sole outpt therapy based on review of records He was compliant with medications prior to admission as evidenced by fill history. On initial examination, patient reflected on the many stressors since hospitalization but was able  to outline several reasons to live as below; assented to treatment plan below. Notably, both pt and wife endorse that there is no real intent behind suicidal statements and that he has lost his sense of identity/purpose with physical morbidity - feel maximizing independence (as in CIR) will do the most for this pt's overall mental health. He does not have access to guns mentioned in nursing notes from 11/17; only has a BB gun.  Please see plan below for detailed recommendations.   Notably, fluvoxamine is a potent 1A2 inhibitor.  Diagnoses:  Active Hospital problems: Principal Problem:   NSTEMI (non-ST elevated myocardial infarction) (HCC) Active Problems:   Mixed hyperlipidemia   MORBID OBESITY   Essential hypertension, benign   CORONARY ATHEROSCLEROSIS NATIVE CORONARY ARTERY   Type 2 diabetes mellitus without complication, without long-term current use of insulin (HCC)   Benign prostatic hyperplasia   Anxiety and depression   Leukocytosis     Plan   ## Psychiatric Medication Recommendations:    - decrease to 100 in setting of recent outpt noncompliance   - trazadone 150 mg at bedtime  - STOP quetiapine 25 mg at bedtime  (orthostasis) -- START  - fish oil supplementation (MDD adjunctive tx) d/w primary OK with bleeding risk 11/18  ## Medical Decision Making Capacity:  --- not formally assessed  ## Further Work-up:  -- Labs for common medical causes decreased energy/low mood such as TSH, folate, b12 While pt on Qtc prolonging medications, please monitor & replete K+ to 4 and Mg2+ to 2   -- most recent EKG on 11/10  had QtC of 504 - widened QRS and relatively high HR likely falsely elevating this. With HR correction alone (fredericia) qtc is 470 -- Pertinent labwork reviewed earlier this admission includes: anemia, hypokalemia, hypoalbuminemia (ie generally medically ill)  ## Disposition:  -- Final dispo not reached - pt making unrealistic suicide statements in setting of  frustration about health -- will likely benefit most from CIR  ## Behavioral / Environmental:  --  Utilize compassion and acknowledge the patient's experiences while setting clear and realistic expectations for care.    ## Safety and Observation Level:  - Based on my clinical evaluation, I estimate the patient to be at moderate risk of self harm in the current setting - At this time, we recommend a 1:1 level of observation. This decision is based on my review of the chart including patient's history and current presentation, interview of the patient, mental status examination, and consideration of suicide risk including evaluating suicidal ideation, plan, intent, suicidal or self-harm behaviors, risk factors, and protective factors. This judgment is based on our ability to directly address suicide risk, implement suicide prevention strategies and develop a safety plan while the patient is in the clinical setting. Please contact our team if there is a concern that risk level has changed. -- as always, will reassess this decision daily and will especially consider downgrading sitter rec if possible to facilitate admission to CIR.   Suicide risk assessment  Patient has following modifiable risk factors for suicide: active suicidal ideation and lack of access to outpatient mental health resources, which we are addressing by starting evidence based adjunctive tx for depression (fish oil), investigating for comorbid causes of MDD, and referral to outpt mental health services closer to dc.   Patient has following non-modifiable or demographic risk factors for suicide: male gender, divorce x1 (married to current wife for 27 years), new physical morbidity  Patient has the following protective factors against suicide: Supportive family, Cultural, spiritual, or religious beliefs that discourage suicide, and no history of suicide attempts   Thank you for this consult request. Recommendations have been  communicated to the primary team.  We will continue to follow at this time.   Katherina Wimer A Carmel Waddington  Psychiatric and Social History   Relevant Aspects of Hospital Course:  Admitted on 04/09/2023 for MI. They were found to have posterior circulation stroke. Current recommendation is to go to CIR  On 11/17, made suicidal statements to nurse Caryn Section which is below: Patient stated that he has access to weapons (357 magnum ) that he has a collection of guns. He also stated later on that he has an ex-wife and family members that would love to see him dead so why continue to live. When asked to explain, patient stated "It is miserable in my situation. I can just use one of my guns."    Charge nurse notified and asked to notify MD. Patient further voiced to CNA that  " I Just want to Die!" The CNA reported this to the Clinical research associate.   Spoke to [employer] - has been making several passively suicidal statements "Monday is a good day to die", commenting on physical decline, references to prior substance use, etc - no mention of overt suicidal threats.   Patient Report:  Patient seen in AM.  He is a bit slow to wake up but fully alert and oriented once he wakes. Attention and ocncentration appear intact on brief neurocognitive exam. He is a little dysarthric but able to make  himself understood.  Understanding of why he is in the hospital and the current barriers to getting out of the hospital.  Spends a lot of time focusing on his physical debility and hopes that he will be able to get up, out of bed and take care of himself prior to discharge.  He has a strong faith in God and believes that God's plan includes him recovering.  Specifically, he believes that suicide is a sin.  He continues to feel that his ex-wife does not care if he lives or dies, but has a good relationship with his current wife of 27 years, his children, and his 2 grandchildren aged 30 and 63.  If discharged from the hospital, he plans  to focus on spending more time with his grandkids which is an activity that he enjoys.  He is feeling better today than yesterday because he feels like he is getting more help from doctors, physical therapy, medication, etc.  He did not endorse overt suicidal ideations to me today.  Other than this, he is a fairly poor historian and psych review and history is limited as below.  He assents to treatment as above.  Psych ROS:  Depression: Fatigue, low energy, low mood, intermittent difficulty sleeping, suicidal ideation (yesterday). Anxiety: Worsened by being in the hospital Mania (lifetime and current): Unable to to endorse Psychosis: (lifetime and current): Did not to endorse  Collateral information:  Had brief meeting with wife who showed up towards end of evaluation.  She provides that she believes patient is "bipolar" but unable to provide any information about manic sx. He has never acted on suicidal ideations (although has experienced priorly around time of bipolar dx) and does not have any access to weapons beyond a BB gun. She has little concern that he will act on these thoughts and her main goal is to make sure he gets stronger as she believes being bedbound and totally dependent on others is main drive of depressive episode. Clarifies that he has not used coke, other drugs, in several  years.   Later in day she contacted pharmacy (notified by NT Sterling)- pt had last filled luvox in September and had returned his Oct pills due to a desire to be off med and no mood stabilizers noted.   Later in day went back to talk to pt/wife, discussing reduction in fluvoxamine and plan over next few days. Apparently used to f/u at [facility] in 2022.    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 Spiritual Hx: quite religious Access to weapons: BB gun only - need to make sure it is removed  Substance History All substance/EtoH remote per wife (pt denied all)   Exam Findings   Psychiatric Specialty Exam:  Presentation  General Appearance: Disheveled  Eye Contact:Fair  Speech:Garbled  Speech Volume:Normal  Handedness:No data recorded  Mood and Affect  Mood:-- (A little better)  Affect:Depressed (brightened only briefly when talking about grandkids)   Thought Process  Thought Processes:Coherent; Goal Directed  Descriptions of Associations:Intact  Orientation:Full (Time, Place and Person)  Thought Content:-- (devoid of delusions, paranoia)  Hallucinations:Hallucinations: None  Ideas of Reference:None  Suicidal Thoughts:Suicidal Thoughts: Yes, Passive  Homicidal Thoughts:Homicidal Thoughts: No   Sensorium  Memory:Immediate Fair; Recent Good; Remote Fair  Judgment:Fair  Insight:Fair   Executive Functions  Concentration:Fair  Attention Span:Good  Recall:Poor  Fund of Knowledge:Fair  Language:Fair   Psychomotor Activity  Psychomotor Activity:Psychomotor Activity: Normal   Assets  Assets:Communication Skills; Desire for Improvement; Social Support   Sleep  Sleep:Sleep: Fair    Physical Exam: Vital signs:  Temp:  [97.5 F (36.4 C)-98.6 F (37 C)] 97.5 F (36.4 C) (11/18 0736) Pulse Rate:  [80-102] 80 (11/18 0736) Resp:  [18-20] 20 (11/18 0736) BP: (136-149)/(82-89) 148/86 (11/18 0736) SpO2:  [91 %-99 %] 98 % (11/18 0736) Weight:  [90 kg] 90 kg (11/18 0551) Physical Exam Constitutional:       Comments: Ill appearing   Pulmonary:     Effort: Pulmonary effort is normal.  Neurological:     Mental Status: He is oriented to person, place, and time.     Blood pressure (!) 148/86, pulse 80, temperature (!) 97.5 F (36.4 C), temperature source Oral, resp. rate 20, height 5\' 11"  (1.803 m), weight 90 kg, SpO2 98%. Body mass index is 27.67 kg/m.   Other History   These have been pulled in through the EMR, reviewed, and updated if appropriate.   Family History:  The patient's family history includes Heart disease in his father and mother.  Medical History: Past Medical History:  Diagnosis Date   Allergic reaction to contrast dye    Bipolar affective disorder (HCC)    Coronary atherosclerosis of native coronary artery    BMS to RCA 2008, [facility], Dr. 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     Surgical History: Past Surgical History:  Procedure Laterality Date   CHOLECYSTECTOMY  2013   JOINT REPLACEMENT     LEFT HEART CATH AND CORONARY ANGIOGRAPHY N/A 04/12/2023   Procedure: LEFT HEART CATH AND CORONARY ANGIOGRAPHY;  Surgeon: [name], MD;  Location: [facility] INVASIVE CV LAB;  Service: Cardiovascular;  Laterality: N/A;   ORIF FEMUR FRACTURE Right 11/20/2022   Procedure: OPEN REDUCTION INTERNAL FIXATION (ORIF) DISTAL FEMUR FRACTURE;  Surgeon: Roby Lofts, MD;  Location: [facility] OR;  Service: Orthopedics;  Laterality: Right;   TOTAL KNEE ARTHROPLASTY     Left   TOTAL KNEE ARTHROPLASTY Right 10/21/2012   Dr Madelon Lips   TOTAL KNEE ARTHROPLASTY Right 10/21/2012   Procedure: TOTAL KNEE ARTHROPLASTY;  Surgeon: Thera Flake., MD;  Location: [facility] OR;  Service: Orthopedics;  Laterality: Right;   VASECTOMY      Medications:   Current Facility-Administered  Medications:    acetaminophen (TYLENOL) tablet 650 mg, 650 mg, Oral, Q4H PRN, [name], MD, 650 mg at 04/16/23 1303   alum & mag hydroxide-simeth (MAALOX/MYLANTA) 200-200-20 MG/5ML suspension 15 mL, 15 mL, Oral, Q6H PRN, Jerald Kief, MD, 15 mL at 04/19/23 0156   amoxicillin-clavulanate (AUGMENTIN) 875-125 MG per tablet 1 tablet, 1 tablet, Oral, Q12H, Meuth, Brooke A, PA-C, 1 tablet at 04/19/23 0910   aspirin EC tablet 81 mg, 81 mg, Oral, Daily, [name], MD, [phone]  mg at 04/19/23 0910   atorvastatin (LIPITOR) tablet 80 mg, 80 mg, Oral, Daily, Marcelino Duster, [place], 80 mg at 04/19/23 0981   clopidogrel (PLAVIX) tablet 75 mg, 75 mg, Oral, Daily, Hilty, Lisette Abu, MD, 75 mg at 04/19/23 1914   diclofenac Sodium (VOLTAREN) 1 % topical gel 2 g, 2 g, Topical, QID, [name], MD, 2 g at 04/18/23 2104   diphenhydrAMINE (BENADRYL) injection 25 mg, 25 mg, Intravenous, Once PRN, Meuth, Brooke A, PA-C   doxycycline (VIBRA-TABS) tablet 100 mg, 100 mg, Oral, Q12H, Meuth, Brooke A, PA-C, 100 mg at 04/19/23 0909   EPINEPHrine (EPI-PEN) injection 0.3 mg, 0.3 mg, Intramuscular, Once PRN, Meuth, Brooke A, PA-C   feeding supplement (GLUCERNA SHAKE) (GLUCERNA SHAKE) liquid 237 mL, 237 mL, Oral, TID BM, [name], MD, 237 mL at 04/18/23 2001   [START ON 04/20/2023] fluvoxaMINE (LUVOX) tablet 100 mg, 100 mg, Oral, Daily, Daniela Siebers A   heparin injection 5,000 Units, 5,000 Units, Subcutaneous, Q8H, Hilty, Lisette Abu, MD, 5,000 Units at 04/19/23 0521   HYDROcodone-acetaminophen (NORCO) 7.5-325 MG per tablet 1 tablet, 1 tablet, Oral, Q6H PRN, [name], MD, 1 tablet at 04/19/23 0909   insulin aspart (novoLOG) injection 0-15 Units, 0-15 Units, Subcutaneous, TID WC, [name], MD, 2 Units at 04/19/23 0915   insulin aspart (novoLOG) injection 0-5 Units, 0-5 Units, Subcutaneous, QHS, [name], MD   insulin aspart (novoLOG) injection 4 Units, 4 Units, Subcutaneous, TID WC,  [name], MD, 4 Units at 04/19/23 0914   insulin glargine-yfgn [facility]) injection 10 Units, 10 Units, Subcutaneous, BID, Marinda Elk, MD, 10 Units at 04/19/23 0910   melatonin tablet 10 mg, 10 mg, Oral, QHS, [name], MD, 10 mg at 04/18/23 2104   metoprolol succinate (TOPROL-XL) 24 hr tablet 100 mg, 100 mg, Oral, Daily, [name], MD, 100 mg at 04/19/23 7829   nitroGLYCERIN (NITROSTAT) SL tablet 0.4 mg, 0.4 mg, Sublingual, Q5 Min x 3 PRN, [name], MD   omega-3 acid ethyl esters (LOVAZA) capsule 1 g, 1 g, Oral, BID, Stacee Earp A   ondansetron (ZOFRAN) injection 4 mg, 4 mg, Intravenous, Q6H PRN, [name], MD   pantoprazole (PROTONIX) EC tablet 40 mg, 40 mg, Oral, Daily, [name], MD, 40 mg at 04/19/23 0910   polyethylene glycol (MIRALAX / GLYCOLAX) packet 17 g, 17 g, Oral, Daily PRN, [name], MD   QUEtiapine (SEROQUEL) tablet 25 mg, 25 mg, Oral, QHS, [name], MD, 25 mg at 04/18/23 2104   sodium chloride flush (NS) 0.9 % injection 3 mL, 3 mL, Intravenous, Q12H, [name], MD, 3 mL at 04/18/23 2103   sodium chloride flush (NS) 0.9 % injection 3 mL, 3 mL, Intravenous, PRN, [name], MD   traZODone (DESYREL) tablet 150 mg, 150 mg, Oral, Mickel Crow, MD, 150 mg at 04/18/23 2104  Allergies: Allergies  Allergen Reactions   Iodinated Contrast Media Hives and Itching   Penicillins Other (See Comments)    Unknown reaction in childhood - patient reported hives Tolerated Augmentin challenge 04/16/23

## 2023-04-20 DIAGNOSIS — I214 Non-ST elevation (NSTEMI) myocardial infarction: Secondary | ICD-10-CM | POA: Diagnosis not present

## 2023-04-20 LAB — RAPID URINE DRUG SCREEN, HOSP PERFORMED
Amphetamines: NOT DETECTED
Barbiturates: NOT DETECTED
Benzodiazepines: NOT DETECTED
Cocaine: NOT DETECTED
Opiates: POSITIVE — AB
Tetrahydrocannabinol: NOT DETECTED

## 2023-04-20 LAB — GLUCOSE, CAPILLARY
Glucose-Capillary: 137 mg/dL — ABNORMAL HIGH (ref 70–99)
Glucose-Capillary: 135 mg/dL — ABNORMAL HIGH (ref 70–99)
Glucose-Capillary: 122 mg/dL — ABNORMAL HIGH (ref 70–99)
Glucose-Capillary: 139 mg/dL — ABNORMAL HIGH (ref 70–99)

## 2023-04-20 MED ORDER — STROKE: EARLY STAGES OF RECOVERY BOOK
Freq: Once | Status: DC
  Filled 2023-04-20: qty 1

## 2023-04-20 NOTE — Progress Notes (Signed)
Progress Note   Patient: Michael Rose BMW:[id number] DOB: 1951/03/14 DOA: 04/09/2023     11 DOS: the patient was seen and examined on 04/20/2023   Brief hospital course: 72 y.o. male past medical history significant for essential hypertension diabetes mellitus type 2 CAD status post PCI, BPH comes in with shortness of breath that started on 04/08/2023, he has been having nonbilious vomiting for about a week prior to admission at [facility] twelve-lead EKG shows sinus tachycardia with diffuse ST segment depression troponins doubled with every repeat last 1 was greater than 3000 chest x-ray showed vascular congestion with right pleural effusion, white count of 17 was transferred to [facility]. 2D echo was done that showed an EF of 40% with regional wall motion abnormality, small inferoapical area which appeared to be frankly dyskinetic.   Heart cath revealed severe multivessel disease with CT surgery consulted for consideration for cabg. 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 Cardiology consulted left heart cath on 04/12/2023, showed severe left main disease 80 to 90%. CT surgery was consulted and following. Deciding on options while below CVA is being addressed by Neurology -Per Cardiology, not CABG candidate at this point. Pt also not recommended for high-risk intervention. Heparin has been d/c'd -Pt now back on DAPT. Remains stable  Acute R paramedian pons CVA, Acute R cerebellar hemisphere CVA, Acute R post temporal lobe and R occipital lobe CVA -new diagnosis this visit, MRI reviewed -Neurology had been following, now signed off -Pt was initially on heparin with ASA 81mg  and plavix DAPT. Heparin was later stopped by Cardiology per above -Neurology to f/u with [facility] in 4 weeks after d/c -CIR was initially planned, however  now plan for SNF, TOC following   Leukocytosis/watery bowel movement: Has remained afebrile No further diarrhea, Tmax was 100.2 respiratory panel was negative. Leukocytosis resolved.  Likely due to steroids   Essential hypertension: Continue metoprolol and Imdur.   Iron deficiency anemia: Will need follow-up with PCP as an outpatient. Patient not remember when was his last colonoscopy.   Hyperlipidemia Continue statins.   Type 2 diabetes mellitus without complication, without long-term current use of insulin (HCC): He received steroids single dose, which makes his blood glucose erratic. Increase long-acting insulin continue sliding scale CBGs before meals and at bedtime.    Obesity: Noted has been counseled.   Anxiety/depression: Continue trazodone and fluoxetine   Benign prostatic hyperplasia Cont home meds  Recent Suicidal ideation -See nursing documentation. Pt had voiced thoughts of wanting to die and harming himself -Appreciate input by Psychiatry.  -Psychiatry recommends fluvoxamine 100mg , trazadone 150mg  at bedtime, stopped seroquel, and to start fish oil -On re-eval, pt is now considered low risk. Suicide sitter has been d/c'd per Psychiatry  Sacral non-pressure wound present on admit Wound / Incision (Open or Dehisced) 04/14/23 Non-pressure wound Sacrum Red draining full thickness (Active)  Date First Assessed/Time First Assessed: 04/14/23 1003   Wound Type: Non-pressure wound  Location: Sacrum  Wound Description (Comments): Red draining full thickness  Present on Admission: Yes    Assessments 04/13/2023  8:26 PM 04/20/2023  8:30 AM  Dressing Type -- Foam - Lift dressing to assess site every shift  Dressing Status -- Clean, Dry, Intact  Dressing Change Frequency -- Twice a day  Site / Wound Assessment -- Clean;Dry  Peri-wound Assessment -- Intact  Wound  Length (cm) 0.3 cm --  Wound Width (cm) 0.3 cm --  Wound Depth (cm) 2 cm --  Wound Volume (cm^3) 0.18 cm^3  --  Wound Surface Area (cm^2) 0.09 cm^2 --  Closure -- None  Drainage Amount -- None     No associated orders.      Subjective:Feeling weaker than normal. Still somewhat hopeful to be able to regain strength  Physical Exam: Vitals:   04/20/23 0000 04/20/23 0448 04/20/23 0729 04/20/23 1148  BP:  112/76 114/72 139/84  Pulse:  87 88 88  Resp:  16 17 16   Temp:  98.3 F (36.8 C) 97.8 F (36.6 C) 97.8 F (36.6 C)  TempSrc:  Oral Oral Oral  SpO2: 96% 98% 95% 97%  Weight:      Height:       General exam: Conversant, in no acute distress Respiratory system: normal chest rise, clear, no audible wheezing Cardiovascular system: regular rhythm, s1-s2 Gastrointestinal system: Nondistended, nontender, pos BS Central nervous system: No seizures, no tremors Extremities: No cyanosis, no joint deformities Skin: No rashes, no pallor Psychiatry: Affect normal // no auditory hallucinations   Data Reviewed:  There are no new results to review at this time.  Family Communication: pt in room, family not at bedside  Disposition: Status is: Inpatient Remains inpatient appropriate because: severity of illness  Planned Discharge Destination: Skilled nursing facility     Author: Rickey Barbara, MD 04/20/2023 4:20 PM  For on call review [email or web address].

## 2023-04-20 NOTE — Progress Notes (Signed)
Michael Rose in pharmacy to make him aware both pyxis are out of the Norco 7.5-325 mg for pts PRN pain. Awaiting medication for pt.

## 2023-04-20 NOTE — Progress Notes (Signed)
Mobility Specialist Progress Note:   04/20/23 1500  Mobility  Activity Transferred from chair to bed  Level of Assistance +2 (takes two people)  Assistive Device Stedy  Activity Response Tolerated well  Mobility Referral Yes  $Mobility charge 1 Mobility  Mobility Specialist Start Time (ACUTE ONLY) 1455  Mobility Specialist Stop Time (ACUTE ONLY) 1507  Mobility Specialist Time Calculation (min) (ACUTE ONLY) 12 min    Pt received in chair, needing to get back to bed. ModA+2 to stand in stedy and transfer to bed. Asymptomatic w/ no complaints throughout. Pt left in bed with call bell and all needs met. Family present.  D'Vante Earlene Plater Mobility Specialist Please contact via Special educational needs teacher or Rehab office at [phone]

## 2023-04-20 NOTE — Progress Notes (Signed)
Mobility Specialist Progress Note:   04/20/23 1100  Mobility  Activity Transferred from bed to chair  Level of Assistance +2 (takes two people)  Assistive Device Stedy  Activity Response Tolerated well  Mobility Referral Yes  $Mobility charge 1 Mobility  Mobility Specialist Start Time (ACUTE ONLY) 1057  Mobility Specialist Stop Time (ACUTE ONLY) 1116  Mobility Specialist Time Calculation (min) (ACUTE ONLY) 19 min    Pt received in bed, agreeable to mobility. Tends to place limitations on himself and needs a lot of encouragement. Stood in stedy w/ modA+2 while Charity fundraiser assisted in [employer]. C/o of R leg trying to cramp. Pt left in chair with call bell and all needs met.  D'Vante Earlene Plater Mobility Specialist Please contact via Special educational needs teacher or Rehab office at [phone]

## 2023-04-20 NOTE — TOC Progression Note (Addendum)
Transition of Care [facility]) - Progression Note    Patient Details  Name: Michael Rose MRN: [id number] Date of Birth: July 16, 1950  Transition of Care [facility]) CM/SW Contact  Delilah Shan, LCSWA Phone Number: 04/20/2023, 12:58 PM  Clinical Narrative:      CSW LVM with [facility]. CSW awaiting call back to start insurance authorization for SNF and PTAR. CSW received consult for patient. CSW spoke with patient at bedside. CSW offered patient psychiatry and counseling resources. Patient accepted. All questions answered. No further questions reported at this time. CSW will continue to follow.  Update- CSW received call from [facility]. CSW started insurance authorization for SNF and PTAR. Insurance authorization currently pending for SNF and PTAR. CSW will continue to follow.   Expected Discharge Plan: Skilled Nursing Facility Barriers to Discharge: Continued Medical Work up  Expected Discharge Plan and Services In-house Referral: Clinical Social Work   Post Acute Care Choice: Skilled Nursing Facility Living arrangements for the past 2 months: Skilled Nursing Facility                                       Social Determinants of Health (SDOH) Interventions SDOH Screenings   Food Insecurity: No Food Insecurity (04/10/2023)  Housing: Low Risk  (04/10/2023)  Transportation Needs: No Transportation Needs (04/10/2023)  Utilities: Not At Risk (04/10/2023)  Financial Resource Strain: High Risk (01/19/2022)  Physical Activity: Insufficiently Active (07/21/2022)   Received from [facility], [facility]  Social Connections: Socially Integrated (07/21/2022)   Received from [facility], [facility]  Stress: No Stress Concern Present (07/21/2022)   Received from [facility], [facility]  Tobacco Use: Low Risk  (04/10/2023)  Health Literacy: Low Risk  (07/21/2022)   Received from [facility], [facility]    Readmission Risk Interventions     No data to display

## 2023-04-20 NOTE — Consult Note (Signed)
Michael Rose Psychiatry Consult Evaluation  Service Date: April 20, 2023 LOS:  LOS: 11 days    Primary Psychiatric Diagnoses  Demoralization 2.  MDD/recurrent/severe 3.  R/o bipolar d/o  Assessment  Michael Rose is a 72 y.o. 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 Current plan is to maximize medical management. He carries the psychiatric diagnoses of ?depression vs bipolar d/o and has a past medical history of difficulty hearing, arthritis, GERD, DMII, HLD, HTN. Psychiatry was consulted for Suicidal ideation with plan Pt told RN he did not want to live, planned to shoot himself, and that he had a firearm he would use to kill himself with  by Dr. Rhona Leavens.    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 There is some question of hx bipolarity (from wife and EMR) however pt is a poor historian, wife cannot answer any questions about mania, and this dx appears to have been made in pt's 50s or 60s while depressed and suicidal without pt ever being hospitalized psychiatrically; further complicating picture is a couple of encounters (records not available) for OCD and remote hx drug abuse. Only mood stabilizer I could find (aided greatly by NT) was a prescription for rexulti that sounds like it was for steroid induced psychosis and not continued long-term Unclear if we will ever really get a full picture of premorbid psych history, however pt with no s/s of mania on exam 11/18 and appears to have been on fluvoxamine as sole outpt therapy based on review of records He was not compliant with medications prior to admission as evidenced by collateral from pharmacy. On initial examination, patient reflected on the many stressors since  hospitalization but was able to outline several reasons to live as below; assented to treatment plan below. Notably, both pt and wife endorse that there is no real intent behind suicidal statements and that he has lost his sense of identity/purpose with physical morbidity - feel maximizing independence (as in CIR) will do the most for this pt's overall mental health. He does not have access to guns mentioned in nursing notes from 11/17; only has a BB gun.  Please see plan below for detailed recommendations.  Notably, fluvoxamine is a potent 1A2 inhibitor.  On followup exam 11/19, he was much more alert when seen in AM. He continues to deny SI. Plan to [employer].   Diagnoses:  Active Hospital problems: Principal Problem:   NSTEMI (non-ST elevated myocardial infarction) (HCC) Active Problems:   Mixed hyperlipidemia   MORBID OBESITY   Essential hypertension, benign   CORONARY ATHEROSCLEROSIS NATIVE CORONARY ARTERY   Type 2 diabetes mellitus without complication, without long-term current use of insulin (HCC)   Benign prostatic hyperplasia   Anxiety and depression   Leukocytosis     Plan   ## Psychiatric Medication Recommendations:  - Continue fluvoxamine 100 in setting of recent outpt noncompliance (depression)  - continue trazadone 150 mg at bedtime (sleep) - continue fish oil supplementation (MDD adjunctive tx) d/w primary OK with bleeding risk 11/18  - STOP quetiapine 25 mg at bedtime  (orthostasis)  ## Medical Decision Making Capacity:  --- not formally assessed  ## Further Work-up:  -- Labs for common medical causes decreased energy/low mood such as TSH, folate, b12 While pt  on Qtc prolonging medications, please monitor & replete K+ to 4 and Mg2+ to 2   -- most recent EKG on 11/10 had QtC of 504 - widened QRS and relatively high HR likely falsely elevating this. With HR correction alone (fredericia) qtc is 470 -- Pertinent labwork reviewed earlier this admission includes:  anemia, hypokalemia, hypoalbuminemia (ie generally medically ill)  ## Disposition:  -- likely SNF (too orthostatic for CIR 11/18) -- needs outpt referral back to [facility] services - d/w SW 11/19  ## Behavioral / Environmental:  --  Utilize compassion and acknowledge the patient's experiences while setting clear and realistic expectations for care.    ## Safety and Observation Level:  - Based on my clinical evaluation, I estimate the patient to be at low risk of self harm in the current setting - At this time, we recommend a routine level of observation. This decision is based on my review of the chart including patient's history and current presentation, interview of the patient, mental status examination, and consideration of suicide risk including evaluating suicidal ideation, plan, intent, suicidal or self-harm behaviors, risk factors, and protective factors. This judgment is based on our ability to directly address suicide risk, implement suicide prevention strategies and develop a safety plan while the patient is in the clinical setting. Please contact our team if there is a concern that risk level has changed. -- as always, will reassess this decision daily and will especially consider downgrading sitter rec if possible to facilitate admission to CIR.   Suicide risk assessment  Patient has following modifiable risk factors for suicide: active suicidal ideation and lack of access to outpatient mental health resources, which we are addressing by starting evidence based adjunctive tx for depression (fish oil), investigating for comorbid causes of MDD, and referral to outpt mental health services closer to dc.   Patient has following non-modifiable or demographic risk factors for suicide: male gender, divorce x1 (married to current wife for 27 years), new physical morbidity  Patient has the following protective factors against suicide: Supportive family, Cultural, spiritual, or religious beliefs  that discourage suicide, and no history of suicide attempts   Thank you for this consult request. Recommendations have been communicated to the primary team.  We will continue to follow at this time.   Adan Baehr A Cordarrius Coad  Psychiatric and Social History   Relevant Aspects of Hospital Course:  Admitted on 04/09/2023 for MI. They were found to have posterior circulation stroke. Current recommendation is to go to SNF as has been too orthostatic for CIR. NO suicidal statements since initial statements 11/177.   Patient Report:  Patient seen in AM.  He is much more alert than when seen in the same time yesterday.   He is feeling "alright" today and much more optimisic about the future. Listed many of the activities he enjoys - watching TV, spending time with grandkids, etc - none of which require 100% return to prior level of mobility. Plans to ask for recliner for Christmas. Has difficulty staying motivated in PT because it is "hard" but understands alternative is to be bedbound. Continues to deny SI - states "I was mad and just saying stuff". Confirms BB gun removed. No AH/VH since seeing bugs several days ago. Slept pretty well - fell asleep quickly, woke up around 2 in pain. Hasn't been out of bed so can't judge ortho stasis and if it responded to med changes yesterday  Psych ROS:  Depression: No SI, fatigue improved. Hopeful for future.  Anxiety: Worsened by being in the hospital Mania (lifetime and current): Endorsed 1?period of up for 4-5 days without change in mood, behavior, etc.  Psychosis: (lifetime and current): Today endorsed single episode of seeing bugs on wall earlier this hospitalization.   Collateral information:  11/18: Had brief meeting with wife who showed up towards end of evaluation.  She provides that she believes patient is "bipolar" but unable to provide any information about manic sx. He has never acted on suicidal ideations (although has experienced priorly around time  of bipolar dx) and does not have any access to weapons beyond a BB gun. She has little concern that he will act on these thoughts and her main goal is to make sure he gets stronger as she believes being bedbound and totally dependent on others is main drive of depressive episode. Clarifies that he has not used coke, other drugs, in several  years.   Later in day she contacted pharmacy (notified by NT Sterling)- pt had last filled luvox in September and had returned his Oct pills due to a desire to be off med and no mood stabilizers noted.   Later in day went back to talk to pt/wife, discussing reduction in fluvoxamine and plan over next few days. Apparently used to f/u at [facility] in 2022.    Psychiatric History:  Information collected from pt, EMR, wife  Prev Dx/Sx: ???bipolar d/o, depression, OCD Current Psych Provider: [name], MD (IM doc) Home Meds (current): fluvoxamine 200 mg Previous Med Trials: rexulti (when on prednisone), trazadone. On remeron several months ago. It sounds like rexulti stopped intentionally when prednisone stopped.  Therapy: pt states yes, duration/type unknown  Prior ECT: unknown Prior Psych Hospitalization: no  Prior Self Harm: no Prior Violence: no  Family Psych History: anxiety Family Hx suicide: no  Social History:  Educational Hx: HS Occupational Hx: Holiday representative, now retired English as a second language teacher Situation: with wife of 27 years Spiritual Hx: quite religious Access to weapons: BB gun only - removed as of 11/19  Substance History All substance/EtoH remote per wife (pt denied all)   Exam Findings   Psychiatric Specialty Exam:  Presentation  General Appearance: -- (Has paid a little more attn to appearance)  Eye Contact:Good  Speech:Garbled  Speech Volume:Normal  Handedness:No data recorded  Mood and Affect  Mood:-- ("Alright")  Affect:Congruent (increased range)   Thought Process  Thought Processes:Coherent; Goal Directed  Descriptions of  Associations:Intact  Orientation:Full (Time, Place and Person)  Thought Content:-- (devoid of delusions, paranoia)  Hallucinations:Hallucinations: None  Ideas of Reference:None  Suicidal Thoughts:Suicidal Thoughts: No  Homicidal Thoughts:Homicidal Thoughts: No   Sensorium  Memory:Immediate Fair; Recent Good; Remote Fair  Judgment:Fair  Insight:Fair   Executive Functions  Concentration:Fair  Attention Span:Good  Recall:Fair  Fund of Knowledge:Fair  Language:Fair   Psychomotor Activity  Psychomotor Activity:Psychomotor Activity: Normal   Assets  Assets:Communication Skills; Desire for Improvement; Social Support   Sleep  Sleep:Sleep: Fair    Physical Exam: Vital signs:  Temp:  [97.4 F (36.3 C)-98.5 F (36.9 C)] 97.8 F (36.6 C) (11/19 0729) Pulse Rate:  [79-90] 88 (11/19 0729) Resp:  [16-20] 17 (11/19 0729) BP: (112-148)/(65-91) 114/72 (11/19 0729) SpO2:  [95 %-98 %] 95 % (11/19 0729) Physical Exam Constitutional:      Comments: Ill appearing   Pulmonary:     Effort: Pulmonary effort is normal.  Neurological:     Mental Status: He is oriented to person, place, and time.     Blood pressure 114/72, pulse  88, temperature 97.8 F (36.6 C), temperature source Oral, resp. rate 17, height 5\' 11"  (1.803 m), weight 90 kg, SpO2 95%. Body mass index is 27.67 kg/m.   Other History   These have been pulled in through the EMR, reviewed, and updated if appropriate.   Family History:  The patient's family history includes Heart disease in his father and mother.  Medical History: Past Medical History:  Diagnosis Date   Allergic reaction to contrast dye    Bipolar affective disorder (HCC)    Coronary atherosclerosis of native coronary artery    BMS to RCA 2008, [facility], Dr. 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     Surgical History: Past Surgical History:  Procedure Laterality Date   CHOLECYSTECTOMY  2013   JOINT REPLACEMENT     LEFT HEART CATH AND CORONARY ANGIOGRAPHY N/A 04/12/2023   Procedure: LEFT HEART CATH AND CORONARY ANGIOGRAPHY;  Surgeon: [name], MD;  Location: [facility] INVASIVE CV LAB;  Service: Cardiovascular;  Laterality: N/A;   ORIF FEMUR FRACTURE Right 11/20/2022   Procedure: OPEN REDUCTION INTERNAL FIXATION (ORIF) DISTAL FEMUR FRACTURE;  Surgeon: Roby Lofts, MD;  Location: [facility] OR;  Service: Orthopedics;  Laterality: Right;   TOTAL KNEE ARTHROPLASTY     Left   TOTAL KNEE ARTHROPLASTY Right 10/21/2012   Dr Madelon Lips   TOTAL KNEE ARTHROPLASTY Right 10/21/2012   Procedure: TOTAL KNEE ARTHROPLASTY;  Surgeon: Thera Flake., MD;  Location: [facility] OR;  Service: Orthopedics;  Laterality: Right;   VASECTOMY      Medications:   Current Facility-Administered Medications:    acetaminophen (TYLENOL) tablet 650 mg, 650 mg, Oral, Q4H PRN, [name], MD, 650 mg at 04/20/23 0842   alum & mag hydroxide-simeth (MAALOX/MYLANTA) 200-200-20 MG/5ML suspension 15 mL, 15 mL, Oral, Q6H PRN, Jerald Kief, MD, 15 mL at 04/19/23 0156   amoxicillin-clavulanate (AUGMENTIN) 875-125 MG per tablet 1 tablet, 1 tablet, Oral, Q12H, Meuth, Brooke A, PA-C, 1 tablet at 04/20/23 7829   aspirin EC tablet 81 mg, 81 mg, Oral, Daily, [name], MD, 81 mg at 04/20/23 0842   atorvastatin (LIPITOR) tablet 80 mg, 80 mg, Oral, Daily, Marcelino Duster, [place], 80 mg at 04/20/23 5621   clopidogrel (PLAVIX) tablet 75 mg, 75 mg, Oral, Daily, Hilty, Lisette Abu, MD, 75 mg at 04/20/23 3086   diclofenac Sodium (VOLTAREN) 1 % topical gel 2 g, 2 g, Topical, QID, [name], MD, 2 g at 04/20/23 0848   diphenhydrAMINE (BENADRYL) injection 25 mg, 25 mg, Intravenous, Once PRN, Meuth, Brooke A, PA-C   doxycycline (VIBRA-TABS) tablet 100 mg,  100 mg, Oral, Q12H, Meuth, Brooke A, PA-C, 100 mg at 04/20/23 0842   EPINEPHrine (EPI-PEN) injection 0.3 mg, 0.3 mg, Intramuscular, Once PRN, Meuth, Brooke A, PA-C   feeding supplement (GLUCERNA SHAKE) (GLUCERNA SHAKE) liquid 237 mL, 237 mL, Oral, TID BM, [name], MD, 237 mL at 04/20/23 0848   fluvoxaMINE (LUVOX) tablet 100 mg, 100 mg, Oral, Daily, Kambri Dismore A, 100 mg at 04/20/23 0842   heparin injection 5,000 Units, 5,000 Units, Subcutaneous, Q8H, Hilty, Lisette Abu, MD, 5,000 Units at 04/20/23 0444   HYDROcodone-acetaminophen (NORCO) 7.5-325 MG per tablet 1 tablet, 1 tablet, Oral,  Q6H PRN, [name], MD, 1 tablet at 04/20/23 0444   insulin aspart (novoLOG) injection 0-15 Units, 0-15 Units, Subcutaneous, TID WC, [name], MD, 2 Units at 04/20/23 0843   insulin aspart (novoLOG) injection 0-5 Units, 0-5 Units, Subcutaneous, QHS, [name], MD   insulin aspart (novoLOG) injection 4 Units, 4 Units, Subcutaneous, TID WC, [name], MD, 4 Units at 04/20/23 0843   insulin glargine-yfgn [facility]) injection 10 Units, 10 Units, Subcutaneous, BID, Marinda Elk, MD, 10 Units at 04/20/23 0843   melatonin tablet 10 mg, 10 mg, Oral, QHS, [name], MD, 10 mg at 04/19/23 2106   metoprolol succinate (TOPROL-XL) 24 hr tablet 100 mg, 100 mg, Oral, Daily, [name], MD, 100 mg at 04/20/23 0843   nitroGLYCERIN (NITROSTAT) SL tablet 0.4 mg, 0.4 mg, Sublingual, Q5 Min x 3 PRN, [name], MD   omega-3 acid ethyl esters (LOVAZA) capsule 1 g, 1 g, Oral, BID, Neiko Trivedi A, 1 g at 04/20/23 0842   ondansetron (ZOFRAN) injection 4 mg, 4 mg, Intravenous, Q6H PRN, [name], MD   pantoprazole (PROTONIX) EC tablet 40 mg, 40 mg, Oral, Daily, [name], MD, 40 mg at 04/20/23 0842   polyethylene glycol (MIRALAX / GLYCOLAX) packet 17 g, 17 g, Oral, Daily PRN, [name], MD   sodium chloride flush (NS) 0.9 % injection 3 mL, 3 mL,  Intravenous, Q12H, [name], MD, 3 mL at 04/20/23 0848   sodium chloride flush (NS) 0.9 % injection 3 mL, 3 mL, Intravenous, PRN, [name], MD   traZODone (DESYREL) tablet 150 mg, 150 mg, Oral, Mickel Crow, MD, 150 mg at 04/19/23 2106  Allergies: Allergies  Allergen Reactions   Iodinated Contrast Media Hives and Itching   Penicillins Other (See Comments)    Unknown reaction in childhood - patient reported hives Tolerated Augmentin challenge 04/16/23

## 2023-04-20 NOTE — Plan of Care (Signed)
Problem: Education: Goal: Knowledge of General Education information will improve Description: Including pain rating scale, medication(s)/side effects and non-pharmacologic comfort measures Outcome: Progressing   Problem: Health Behavior/Discharge Planning: Goal: Ability to manage health-related needs will improve Outcome: Progressing   Problem: Clinical Measurements: Goal: Ability to maintain clinical measurements within normal limits will improve Outcome: Progressing Goal: Will remain free from infection Outcome: Progressing Goal: Diagnostic test results will improve Outcome: Progressing Goal: Respiratory complications will improve Outcome: Progressing Goal: Cardiovascular complication will be avoided Outcome: Progressing   Problem: Activity: Goal: Risk for activity intolerance will decrease Outcome: Progressing   Problem: Nutrition: Goal: Adequate nutrition will be maintained Outcome: Progressing   Problem: Coping: Goal: Level of anxiety will decrease Outcome: Progressing   Problem: Elimination: Goal: Will not experience complications related to bowel motility Outcome: Progressing Goal: Will not experience complications related to urinary retention Outcome: Progressing   Problem: Pain Management: Goal: General experience of comfort will improve Outcome: Progressing   Problem: Safety: Goal: Ability to remain free from injury will improve Outcome: Progressing   Problem: Skin Integrity: Goal: Risk for impaired skin integrity will decrease Outcome: Progressing   Problem: Education: Goal: Understanding of cardiac disease, CV risk reduction, and recovery process will improve Outcome: Progressing Goal: Individualized Educational Video(s) Outcome: Progressing   Problem: Activity: Goal: Ability to tolerate increased activity will improve Outcome: Progressing   Problem: Cardiac: Goal: Ability to achieve and maintain adequate cardiovascular perfusion will  improve Outcome: Progressing   Problem: Health Behavior/Discharge Planning: Goal: Ability to safely manage health-related needs after discharge will improve Outcome: Progressing   Problem: Education: Goal: Ability to describe self-care measures that may prevent or decrease complications (Diabetes Survival Skills Education) will improve Outcome: Progressing Goal: Individualized Educational Video(s) Outcome: Progressing   Problem: Coping: Goal: Ability to adjust to condition or change in health will improve Outcome: Progressing   Problem: Fluid Volume: Goal: Ability to maintain a balanced intake and output will improve Outcome: Progressing   Problem: Health Behavior/Discharge Planning: Goal: Ability to identify and utilize available resources and services will improve Outcome: Progressing Goal: Ability to manage health-related needs will improve Outcome: Progressing   Problem: Metabolic: Goal: Ability to maintain appropriate glucose levels will improve Outcome: Progressing   Problem: Nutritional: Goal: Maintenance of adequate nutrition will improve Outcome: Progressing Goal: Progress toward achieving an optimal weight will improve Outcome: Progressing   Problem: Skin Integrity: Goal: Risk for impaired skin integrity will decrease Outcome: Progressing   Problem: Tissue Perfusion: Goal: Adequacy of tissue perfusion will improve Outcome: Progressing   Problem: Education: Goal: Understanding of CV disease, CV risk reduction, and recovery process will improve Outcome: Progressing Goal: Individualized Educational Video(s) Outcome: Progressing   Problem: Activity: Goal: Ability to return to baseline activity level will improve Outcome: Progressing   Problem: Cardiovascular: Goal: Ability to achieve and maintain adequate cardiovascular perfusion will improve Outcome: Progressing Goal: Vascular access site(s) Level 0-1 will be maintained Outcome: Progressing    Problem: Health Behavior/Discharge Planning: Goal: Ability to safely manage health-related needs after discharge will improve Outcome: Progressing   Problem: Education: Goal: Knowledge of disease or condition will improve Outcome: Progressing Goal: Knowledge of secondary prevention will improve (MUST DOCUMENT ALL) Outcome: Progressing Goal: Knowledge of patient specific risk factors will improve Loraine Leriche N/A or DELETE if not current risk factor) Outcome: Progressing   Problem: Ischemic Stroke/TIA Tissue Perfusion: Goal: Complications of ischemic stroke/TIA will be minimized Outcome: Progressing   Problem: Coping: Goal: Will verbalize positive feelings about self Outcome: Progressing  Goal: Will identify appropriate support needs Outcome: Progressing   Problem: Health Behavior/Discharge Planning: Goal: Ability to manage health-related needs will improve Outcome: Progressing Goal: Goals will be collaboratively established with patient/family Outcome: Progressing

## 2023-04-20 NOTE — Plan of Care (Signed)
  Problem: Education: Goal: Knowledge of General Education information will improve Description: Including pain rating scale, medication(s)/side effects and non-pharmacologic comfort measures Outcome: Progressing   Problem: Health Behavior/Discharge Planning: Goal: Ability to manage health-related needs will improve Outcome: Progressing   Problem: Clinical Measurements: Goal: Ability to maintain clinical measurements within normal limits will improve Outcome: Progressing Goal: Will remain free from infection Outcome: Progressing Goal: Diagnostic test results will improve Outcome: Progressing Goal: Respiratory complications will improve Outcome: Progressing Goal: Cardiovascular complication will be avoided Outcome: Progressing   Problem: Activity: Goal: Risk for activity intolerance will decrease Outcome: Progressing   Problem: Nutrition: Goal: Adequate nutrition will be maintained Outcome: Progressing   Problem: Coping: Goal: Level of anxiety will decrease Outcome: Progressing   Problem: Elimination: Goal: Will not experience complications related to bowel motility Outcome: Progressing Goal: Will not experience complications related to urinary retention Outcome: Progressing   Problem: Pain Management: Goal: General experience of comfort will improve Outcome: Progressing   Problem: Safety: Goal: Ability to remain free from injury will improve Outcome: Progressing   Problem: Skin Integrity: Goal: Risk for impaired skin integrity will decrease Outcome: Progressing   Problem: Education: Goal: Understanding of cardiac disease, CV risk reduction, and recovery process will improve Outcome: Progressing Goal: Individualized Educational Video(s) Outcome: Progressing   Problem: Activity: Goal: Ability to tolerate increased activity will improve Outcome: Progressing   Problem: Cardiac: Goal: Ability to achieve and maintain adequate cardiovascular perfusion will  improve Outcome: Progressing   Problem: Health Behavior/Discharge Planning: Goal: Ability to safely manage health-related needs after discharge will improve Outcome: Progressing   Problem: Education: Goal: Ability to describe self-care measures that may prevent or decrease complications (Diabetes Survival Skills Education) will improve Outcome: Progressing Goal: Individualized Educational Video(s) Outcome: Progressing   Problem: Coping: Goal: Ability to adjust to condition or change in health will improve Outcome: Progressing   Problem: Fluid Volume: Goal: Ability to maintain a balanced intake and output will improve Outcome: Progressing   Problem: Health Behavior/Discharge Planning: Goal: Ability to identify and utilize available resources and services will improve Outcome: Progressing Goal: Ability to manage health-related needs will improve Outcome: Progressing   Problem: Metabolic: Goal: Ability to maintain appropriate glucose levels will improve Outcome: Progressing   Problem: Nutritional: Goal: Maintenance of adequate nutrition will improve Outcome: Progressing Goal: Progress toward achieving an optimal weight will improve Outcome: Progressing   Problem: Skin Integrity: Goal: Risk for impaired skin integrity will decrease Outcome: Progressing   Problem: Tissue Perfusion: Goal: Adequacy of tissue perfusion will improve Outcome: Progressing   Problem: Education: Goal: Understanding of CV disease, CV risk reduction, and recovery process will improve Outcome: Progressing Goal: Individualized Educational Video(s) Outcome: Progressing   Problem: Activity: Goal: Ability to return to baseline activity level will improve Outcome: Progressing   Problem: Cardiovascular: Goal: Ability to achieve and maintain adequate cardiovascular perfusion will improve Outcome: Progressing Goal: Vascular access site(s) Level 0-1 will be maintained Outcome: Progressing    Problem: Health Behavior/Discharge Planning: Goal: Ability to safely manage health-related needs after discharge will improve Outcome: Progressing

## 2023-04-21 ENCOUNTER — Inpatient Hospital Stay (HOSPITAL_COMMUNITY): Payer: PPO

## 2023-04-21 DIAGNOSIS — I214 Non-ST elevation (NSTEMI) myocardial infarction: Secondary | ICD-10-CM | POA: Diagnosis not present

## 2023-04-21 LAB — CBC
HCT: 39.8 % (ref 39.0–52.0)
Hemoglobin: 12.7 g/dL — ABNORMAL LOW (ref 13.0–17.0)
MCH: 25.1 pg — ABNORMAL LOW (ref 26.0–34.0)
MCHC: 31.9 g/dL (ref 30.0–36.0)
MCV: 78.8 fL — ABNORMAL LOW (ref 80.0–100.0)
Platelets: 261 10*3/uL (ref 150–400)
RBC: 5.05 MIL/uL (ref 4.22–5.81)
RDW: 17.4 % — ABNORMAL HIGH (ref 11.5–15.5)
WBC: 9.2 10*3/uL (ref 4.0–10.5)
nRBC: 0 % (ref 0.0–0.2)

## 2023-04-21 LAB — COMPREHENSIVE METABOLIC PANEL
ALT: 23 U/L (ref 0–44)
AST: 18 U/L (ref 15–41)
Albumin: 2.6 g/dL — ABNORMAL LOW (ref 3.5–5.0)
Alkaline Phosphatase: 73 U/L (ref 38–126)
Anion gap: 7 (ref 5–15)
BUN: 13 mg/dL (ref 8–23)
CO2: 22 mmol/L (ref 22–32)
Calcium: 8.8 mg/dL — ABNORMAL LOW (ref 8.9–10.3)
Chloride: 109 mmol/L (ref 98–111)
Creatinine, Ser: 0.7 mg/dL (ref 0.61–1.24)
GFR, Estimated: >60 mL/min (ref >60)
Glucose, Bld: 138 mg/dL — ABNORMAL HIGH (ref 70–99)
Potassium: 4.1 mmol/L (ref 3.5–5.1)
Sodium: 138 mmol/L (ref 135–145)
Total Bilirubin: 1.1 mg/dL (ref <1.2)
Total Protein: 6.1 g/dL — ABNORMAL LOW (ref 6.5–8.1)

## 2023-04-21 LAB — GLUCOSE, CAPILLARY
Glucose-Capillary: 137 mg/dL — ABNORMAL HIGH (ref 70–99)
Glucose-Capillary: 127 mg/dL — ABNORMAL HIGH (ref 70–99)
Glucose-Capillary: 124 mg/dL — ABNORMAL HIGH (ref 70–99)
Glucose-Capillary: 199 mg/dL — ABNORMAL HIGH (ref 70–99)

## 2023-04-21 NOTE — Progress Notes (Signed)
Physical Therapy Treatment Patient Details Name: Michael Rose MRN: [id number] DOB: 1950/06/17 Today's Date: 04/21/2023   History of Present Illness Pt is 72 year old presented to [facility] on  04/09/23 from [facility] for likely viral gastroenteritis and encephalopathy in setting on profound hypokalemia. Pt with elevated troponin and unclear if NSTEMI vs demand ischemia. Cardiology consulted and underwent cardiac cath on 11/11. On 04/13/23 pt developed new onset of Lt facial droop, Lt UE ataxia, and slurred speech. 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    PT Comments  Pt admitted with above diagnosis. Pt was able to stand in [place] x 2 attempts today.  BP stable therefore was able to get to chair although pt was dizzy.  Pt self limiting.  Attempt to engage pt in incr activity and exercise with pt declining "too much activity."Will continue to progress pt as able.  Pt currently with functional limitations due to the deficits listed below (see PT Problem List). Pt will benefit from acute skilled PT to increase their independence and safety with mobility to allow discharge.       If plan is discharge home, recommend the following: Two people to help with walking and/or transfers;Two people to help with bathing/dressing/bathroom;Assistance with cooking/housework;Assist for transportation;Supervision due to cognitive status;Help with stairs or ramp for entrance;Assistance with feeding   Can travel by private vehicle     No  Equipment Recommendations  None recommended by PT    Recommendations for Other Services       Precautions / Restrictions Precautions Precautions: Fall Precaution Comments: very anxious, L knee buckles Restrictions Weight Bearing Restrictions: No     Mobility  Bed Mobility Overal  bed mobility: Needs Assistance Bed Mobility: Supine to Sit, Sidelying to Sit Rolling: Used rails, Min assist Sidelying to sit: Contact guard assist       General bed mobility comments: Light contact assist to lift trunk to EOB but good effort on pt part with bedrails.    Transfers Overall transfer level: Needs assistance Equipment used: Ambulation equipment used Transfers: Sit to/from Stand Sit to Stand: Mod assist, +2 safety/equipment, +2 physical assistance           General transfer comment: to stand in [place], poor upright posturing of trunk, impulsive with movements.  Difficulty standing fully upright.  BP stable to day therefore transferred to chair in [place].  Recommend nursing get pt up with [place] or [place] 2 x daily. Transfer via Lift Equipment: Stedy  Ambulation/Gait                   Stairs             Wheelchair Mobility     Tilt Bed    Modified Rankin (Stroke Patients Only) Modified Rankin (Stroke Patients Only) Pre-Morbid Rankin Score: Moderately severe disability Modified Rankin: Severe disability     Balance Overall balance assessment: Needs assistance Sitting-balance support: Feet supported, Bilateral upper extremity supported Sitting balance-Leahy Scale: Fair Sitting balance - Comments: sitting EOB with UE support   Standing balance support: Bilateral upper extremity supported, During functional activity, Reliant on assistive device for balance Standing balance-Leahy Scale: Poor Standing balance comment: Pt reliant on [place]edy and assist to maintain balance  Cognition Arousal: Alert Behavior During Therapy: Anxious, Restless Overall Cognitive Status: Difficult to assess Area of Impairment: Attention, Safety/judgement, Awareness, Problem solving, Following commands                   Current Attention Level: Focused   Following Commands: Follows one step commands consistently, Follows  one step commands with increased time Safety/Judgement: Decreased awareness of safety, Decreased awareness of deficits Awareness: Emergent, Intellectual Problem Solving: Slow processing, Decreased initiation, Difficulty sequencing, Requires verbal cues General Comments: impulsive with movements, very anxious and limited by this, max encouragement to participate. cues for attention, sequencing and engagement needed.        Exercises General Exercises - Lower Extremity Ankle Circles/Pumps: AROM, Both, 10 reps, Supine Long Arc Quad: AROM, Both, 10 reps, Seated Hip Flexion/Marching: AROM, Both, 10 reps, Seated    General Comments        Pertinent Vitals/Pain Pain Assessment Pain Assessment: Faces Faces Pain Scale: Hurts whole lot Pain Location: buttock at sacral wound Pain Descriptors / Indicators: Grimacing, Guarding, Sore Pain Intervention(s): Limited activity within patient's tolerance, Monitored during session, Repositioned    Home Living                          Prior Function            PT Goals (current goals can now be found in the care plan section) Acute Rehab PT Goals Patient Stated Goal: get better Progress towards PT goals: Progressing toward goals    Frequency    Min 1X/week      PT Plan      Co-evaluation              AM-PAC PT "6 Clicks" Mobility   Outcome Measure  Help needed turning from your back to your side while in a flat bed without using bedrails?: A Little Help needed moving from lying on your back to sitting on the side of a flat bed without using bedrails?: A Lot Help needed moving to and from a bed to a chair (including a wheelchair)?: Total Help needed standing up from a chair using your arms (e.g., wheelchair or bedside chair)?: A Lot Help needed to walk in hospital room?: Total Help needed climbing 3-5 steps with a railing? : Total 6 Click Score: 10    End of Session Equipment Utilized During Treatment: Gait  belt Activity Tolerance: Patient limited by fatigue (anxiety) Patient left: with call bell/phone within reach;in chair;with chair alarm set Nurse Communication: Mobility status;Need for lift equipment PT Visit Diagnosis: Unsteadiness on feet (R26.81);Other abnormalities of gait and mobility (R26.89);Repeated falls (R29.6);Muscle weakness (generalized) (M62.81);Pain;Hemiplegia and hemiparesis Hemiplegia - Right/Left: Left Hemiplegia - dominant/non-dominant: Non-dominant Hemiplegia - caused by: Cerebral infarction Pain - Right/Left: Left Pain - part of body: Ankle and joints of foot;Leg     Time: 0160-1093 PT Time Calculation (min) (ACUTE ONLY): 16 min  Charges:    $Therapeutic Activity: 8-22 mins PT General Charges $$ ACUTE PT VISIT: 1 Visit                     Royston Bekele M,PT Acute Rehab Services [phone]    Bevelyn Buckles 04/21/2023, 2:15 PM

## 2023-04-21 NOTE — Plan of Care (Signed)
  Problem: Education: Goal: Knowledge of General Education information will improve Description: Including pain rating scale, medication(s)/side effects and non-pharmacologic comfort measures Outcome: Progressing   Problem: Health Behavior/Discharge Planning: Goal: Ability to manage health-related needs will improve Outcome: Progressing   Problem: Clinical Measurements: Goal: Ability to maintain clinical measurements within normal limits will improve Outcome: Progressing Goal: Will remain free from infection Outcome: Progressing Goal: Diagnostic test results will improve Outcome: Progressing Goal: Respiratory complications will improve Outcome: Progressing Goal: Cardiovascular complication will be avoided Outcome: Progressing   Problem: Activity: Goal: Risk for activity intolerance will decrease Outcome: Progressing   Problem: Nutrition: Goal: Adequate nutrition will be maintained Outcome: Progressing   Problem: Coping: Goal: Level of anxiety will decrease Outcome: Progressing   Problem: Elimination: Goal: Will not experience complications related to bowel motility Outcome: Progressing Goal: Will not experience complications related to urinary retention Outcome: Progressing   Problem: Pain Management: Goal: General experience of comfort will improve Outcome: Progressing   Problem: Safety: Goal: Ability to remain free from injury will improve Outcome: Progressing   Problem: Skin Integrity: Goal: Risk for impaired skin integrity will decrease Outcome: Progressing   Problem: Education: Goal: Understanding of cardiac disease, CV risk reduction, and recovery process will improve Outcome: Progressing Goal: Individualized Educational Video(s) Outcome: Progressing   Problem: Activity: Goal: Ability to tolerate increased activity will improve Outcome: Progressing   Problem: Cardiac: Goal: Ability to achieve and maintain adequate cardiovascular perfusion will  improve Outcome: Progressing   Problem: Health Behavior/Discharge Planning: Goal: Ability to safely manage health-related needs after discharge will improve Outcome: Progressing   Problem: Education: Goal: Ability to describe self-care measures that may prevent or decrease complications (Diabetes Survival Skills Education) will improve Outcome: Progressing Goal: Individualized Educational Video(s) Outcome: Progressing   Problem: Coping: Goal: Ability to adjust to condition or change in health will improve Outcome: Progressing   Problem: Fluid Volume: Goal: Ability to maintain a balanced intake and output will improve Outcome: Progressing   Problem: Health Behavior/Discharge Planning: Goal: Ability to identify and utilize available resources and services will improve Outcome: Progressing Goal: Ability to manage health-related needs will improve Outcome: Progressing   Problem: Metabolic: Goal: Ability to maintain appropriate glucose levels will improve Outcome: Progressing   Problem: Nutritional: Goal: Maintenance of adequate nutrition will improve Outcome: Progressing Goal: Progress toward achieving an optimal weight will improve Outcome: Progressing   Problem: Skin Integrity: Goal: Risk for impaired skin integrity will decrease Outcome: Progressing   Problem: Tissue Perfusion: Goal: Adequacy of tissue perfusion will improve Outcome: Progressing   Problem: Education: Goal: Understanding of CV disease, CV risk reduction, and recovery process will improve Outcome: Progressing Goal: Individualized Educational Video(s) Outcome: Progressing   Problem: Activity: Goal: Ability to return to baseline activity level will improve Outcome: Progressing   Problem: Cardiovascular: Goal: Ability to achieve and maintain adequate cardiovascular perfusion will improve Outcome: Progressing Goal: Vascular access site(s) Level 0-1 will be maintained Outcome: Progressing    Problem: Health Behavior/Discharge Planning: Goal: Ability to safely manage health-related needs after discharge will improve Outcome: Progressing

## 2023-04-21 NOTE — Progress Notes (Signed)
9 Days Post-Op   Subjective/Chief Complaint: Still having some soreness at buttock wound but overall stable   Objective: Vital signs in last 24 hours: Temp:  [97.5 F (36.4 C)-98 F (36.7 C)] 98 F (36.7 C) (11/20 0748) Pulse Rate:  [80-93] 93 (11/20 0748) Resp:  [16-18] 16 (11/20 0748) BP: (117-139)/(70-93) 139/93 (11/20 0748) SpO2:  [95 %-99 %] 96 % (11/20 0748) Last BM Date : 04/20/23  Intake/Output from previous day: 11/19 0701 - 11/20 0700 In: 120 [P.O.:120] Out: 5300 [Urine:5300] Intake/Output this shift: No intake/output data recorded.  Skin with < 1 cm opening with some very thin purulent drainage when expressed, some tunneling inferior and to right side, no real cellulitis, fluctuance, induration   Lab Results:  Recent Labs    04/19/23 0732 04/21/23 0541  WBC 10.9* 9.2  HGB 13.0 12.7*  HCT 42.0 39.8  PLT 279 261   BMET Recent Labs    04/21/23 0541  NA 138  K 4.1  CL 109  CO2 22  GLUCOSE 138*  BUN 13  CREATININE 0.70  CALCIUM 8.8*   PT/INR No results for input(s): "LABPROT", "INR" in the last 72 hours. ABG No results for input(s): "PHART", "HCO3" in the last 72 hours.  Invalid input(s): "PCO2", "PO2"  Studies/Results: No results found.  Anti-infectives: Anti-infectives (From admission, onward)    Start     Dose/Rate Route Frequency Ordered Stop   04/16/23 1400  ceFEPIme (MAXIPIME) 2 g in sodium chloride 0.9 % 100 mL IVPB  Status:  Discontinued        2 g 200 mL/hr over 30 Minutes Intravenous Every 8 hours 04/16/23 0955 04/16/23 1140   04/16/23 1300  amoxicillin-clavulanate (AUGMENTIN) 875-125 MG per tablet 1 tablet        1 tablet Oral Every 12 hours 04/16/23 1202     04/16/23 1300  doxycycline (VIBRA-TABS) tablet 100 mg        100 mg Oral Every 12 hours 04/16/23 1202     04/16/23 1100  metroNIDAZOLE (FLAGYL) tablet 500 mg  Status:  Discontinued        500 mg Oral Every 12 hours 04/16/23 1008 04/16/23 1140   04/16/23 1045   metroNIDAZOLE (FLAGYL) IVPB 500 mg  Status:  Discontinued        500 mg 100 mL/hr over 60 Minutes Intravenous Every 12 hours 04/16/23 0955 04/16/23 1008       Assessment/Plan:  Sacral wound with abscess - Ongoing purulent drainage which appears thinner than previously described. No significant overlying cellulitis, fluctuance or induration. Continues to decompress well. Do not recommend further procedural drainage at this time. Continue wound care and pressure off loading. Continue abx.    ID - doxy/augmentin 11/15>> VTE - heparin subq, plavix FEN - CM diet Foley - none   NSTEMI CAD, multivessel disease, hx PCI 2010 Acute stroke HTN DM HLD  Eric Form, [facility] Surgery 04/21/2023, 8:55 AM Please see Amion for pager number during day hours 7:00am-4:30pm

## 2023-04-21 NOTE — Plan of Care (Signed)

## 2023-04-21 NOTE — TOC Progression Note (Signed)
Transition of Care [facility]) - Progression Note    Patient Details  Name: Michael Rose MRN: [id number] Date of Birth: [birth year]  [facility] CM/SW Contact  Delilah Shan, LCSWA Phone Number: 04/21/2023, 4:32 PM  Clinical Narrative:     CSW called patients insurance to follow up on patients insurance authorization for SNF and PTAR. Patients insurance currently pending for SNF and PTAR. CSW updated patients spouse Michael Rose. CSW will continue to follow.   Expected Discharge Plan: Skilled Nursing Facility Barriers to Discharge: Continued Medical Work up  Expected Discharge Plan and Services In-house Referral: Clinical Social Work   Post Acute Care Choice: Skilled Nursing Facility Living arrangements for the past 2 months: Skilled Nursing Facility                                       Social Determinants of Health (SDOH) Interventions SDOH Screenings   Food Insecurity: No Food Insecurity (04/10/2023)  Housing: Low Risk  (04/10/2023)  Transportation Needs: No Transportation Needs (04/10/2023)  Utilities: Not At Risk (04/10/2023)  Financial Resource Strain: High Risk (01/19/2022)  Physical Activity: Insufficiently Active (07/21/2022)   Received from [facility], [facility]  Social Connections: Socially Integrated (07/21/2022)   Received from [facility], [facility]  Stress: No Stress Concern Present (07/21/2022)   Received from [facility], [facility]  Tobacco Use: Low Risk  (04/10/2023)  Health Literacy: Low Risk  (07/21/2022)   Received from [facility], [facility]    Readmission Risk Interventions     No data to display

## 2023-04-21 NOTE — Consult Note (Signed)
Michael Rose Psychiatry Consult Evaluation  Service Date: April 21, 2023 LOS:  LOS: 12 days    Primary Psychiatric Diagnoses  Demoralization 2.  MDD/recurrent/severe 3.  R/o bipolar d/o  Assessment  Michael Rose is a 72 y.o. 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 Current plan is to maximize medical management. He carries the psychiatric diagnoses of ?depression vs bipolar d/o and has a past medical history of difficulty hearing, arthritis, GERD, DMII, HLD, HTN. Psychiatry was consulted for Suicidal ideation with plan Pt told RN he did not want to live, planned to shoot himself, and that he had a firearm he would use to kill himself with  by Dr. Rhona Leavens.    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 There is some question of hx bipolarity (from wife and EMR) however pt is a poor historian, wife cannot answer any questions about mania, and this dx appears to have been made in pt's 50s or 60s while depressed and suicidal without pt ever being hospitalized psychiatrically; further complicating picture is a couple of encounters (records not available) for OCD and remote hx drug abuse. Only mood stabilizer I could find (aided greatly by NT) was a prescription for rexulti that sounds like it was for steroid induced psychosis and not continued long-term Unclear if we will ever really get a full picture of premorbid psych history, however pt with no s/s of mania on exam 11/18 and appears to have been on fluvoxamine as sole outpt therapy based on review of records He was not compliant with medications prior to admission as evidenced by collateral from pharmacy. On initial examination, patient reflected on the many stressors since  hospitalization but was able to outline several reasons to live as below; assented to treatment plan below. Notably, both pt and wife endorse that there is no real intent behind suicidal statements and that he has lost his sense of identity/purpose with physical morbidity - feel maximizing independence (as in CIR) will do the most for this pt's overall mental health. He does not have access to guns mentioned in nursing notes from 11/17; only has a BB gun.  Please see plan below for detailed recommendations.  Notably, fluvoxamine is a potent 1A2 inhibitor.  On serial f/u exams, has continued to deny SI. Thankfully, orthostasis is significantly improved - will hold off on med changes for now. Likely due to stopping seroquel > dec fluvoxamine. No overt s/sx mania.   Diagnoses:  Active Hospital problems: Principal Problem:   NSTEMI (non-ST elevated myocardial infarction) (HCC) Active Problems:   Mixed hyperlipidemia   MORBID OBESITY   Essential hypertension, benign   CORONARY ATHEROSCLEROSIS NATIVE CORONARY ARTERY   Type 2 diabetes mellitus without complication, without long-term current use of insulin (HCC)   Benign prostatic hyperplasia   Anxiety and depression   Leukocytosis     Plan   ## Psychiatric Medication Recommendations:  - Continue fluvoxamine 100 in setting of recent outpt noncompliance (depression)  - continue trazadone 150 mg at bedtime (sleep) - continue fish oil supplementation (MDD adjunctive tx) d/w primary OK with bleeding risk 11/18  - STOP quetiapine 25 mg at bedtime  (orthostasis)  ## Medical Decision Making Capacity:  --- not formally assessed  ## Further Work-up:  -- Labs for  common medical causes decreased energy/low mood such as TSH, folate, b12 While pt on Qtc prolonging medications, please monitor & replete K+ to 4 and Mg2+ to 2   -- most recent EKG on 11/10 had QtC of 504 - widened QRS and relatively high HR likely falsely elevating this. With HR  correction alone (fredericia) qtc is 470 -- Pertinent labwork reviewed earlier this admission includes: anemia, hypokalemia, hypoalbuminemia (ie generally medically ill)  ## Disposition:  -- likely SNF (too orthostatic for CIR 11/18) - orthostasis improving.  -- needs outpt referral back to [facility] services - d/w SW 11/19  ## Behavioral / Environmental:  --  Utilize compassion and acknowledge the patient's experiences while setting clear and realistic expectations for care.    ## Safety and Observation Level:  - Based on my clinical evaluation, I estimate the patient to be at low risk of self harm in the current setting - At this time, we recommend a routine level of observation. This decision is based on my review of the chart including patient's history and current presentation, interview of the patient, mental status examination, and consideration of suicide risk including evaluating suicidal ideation, plan, intent, suicidal or self-harm behaviors, risk factors, and protective factors. This judgment is based on our ability to directly address suicide risk, implement suicide prevention strategies and develop a safety plan while the patient is in the clinical setting. Please contact our team if there is a concern that risk level has changed. -- as always, will reassess this decision daily and will especially consider downgrading sitter rec if possible to facilitate admission to CIR.   Suicide risk assessment  Patient has following modifiable risk factors for suicide: active suicidal ideation and lack of access to outpatient mental health resources, which we are addressing by starting evidence based adjunctive tx for depression (fish oil), investigating for comorbid causes of MDD, and referral to outpt mental health services closer to dc.   Patient has following non-modifiable or demographic risk factors for suicide: male gender, divorce x1 (married to current wife for 27 years), new physical  morbidity  Patient has the following protective factors against suicide: Supportive family, Cultural, spiritual, or religious beliefs that discourage suicide, and no history of suicide attempts   Thank you for this consult request. Recommendations have been communicated to the primary team.  We will sign off at this time.   Prabhjot Maddux A Albie Bazin  Psychiatric and Social History   Relevant Aspects of Hospital Course:  Admitted on 04/09/2023 for MI. They were found to have posterior circulation stroke. Current recommendation is to go to SNF as has been too orthostatic for CIR. NO suicidal statements since initial statements 11/177.   Patient Report:  Patient seen in afternoon. Remains much more alert.  Discussed improvements over past few days - was able to get up with significantly reduced dizziness with PT. Discussed things he is looking forward to (similar) and things that give him meaning. No SI - "I have God and wouldn't be here without him" although acknowledges danger of guns in impulsive states. Agreeable to mental health f/u, voices understanding of med tx plan. NO HI, AH/VH.   Psych ROS:  Depression: No SI, fatigue improved. Hopeful for future. Improved.  Anxiety: Worsened by being in the hospital Mania (lifetime and current): Endorsed 1?period of up for 4-5 days without change in mood, behavior, etc.  Psychosis: (lifetime and current): Today endorsed single episode of seeing bugs on wall earlier this hospitalization.   Collateral information:  11/18: Had brief meeting with wife who showed up towards end of evaluation.  She provides that she believes patient is "bipolar" but unable to provide any information about manic sx. He has never acted on suicidal ideations (although has experienced priorly around time of bipolar dx) and does not have any access to weapons beyond a BB gun. She has little concern that he will act on these thoughts and her main goal is to make sure he gets  stronger as she believes being bedbound and totally dependent on others is main drive of depressive episode. Clarifies that he has not used coke, other drugs, in several  years.   Later in day she contacted pharmacy (notified by NT Sterling)- pt had last filled luvox in September and had returned his Oct pills due to a desire to be off med and no mood stabilizers noted.   Later in day went back to talk to pt/wife, discussing reduction in fluvoxamine and plan over next few days. Apparently used to f/u at [facility] in 2022.    Psychiatric History:  Information collected from pt, EMR, wife  Prev Dx/Sx: ???bipolar d/o, depression, OCD Current Psych Provider: [name], MD (IM doc) Home Meds (current): fluvoxamine 200 mg Previous Med Trials: rexulti (when on prednisone), trazadone. On remeron several months ago. It sounds like rexulti stopped intentionally when prednisone stopped.  Therapy: pt states yes, duration/type unknown  Prior ECT: unknown Prior Psych Hospitalization: no  Prior Self Harm: no Prior Violence: no  Family Psych History: anxiety Family Hx suicide: no  Social History:  Educational Hx: HS Occupational Hx: Holiday representative, now retired English as a second language teacher Situation: with wife of 27 years Spiritual Hx: quite religious Access to weapons: BB gun only - removed as of 11/19  Substance History All substance/EtoH remote per wife (pt denied all)   Exam Findings   Psychiatric Specialty Exam:  Presentation  General Appearance: Appropriate for Environment  Eye Contact:Good  Speech:-- (mildly dysarthric, much easier to understand)  Speech Volume:Normal  Handedness:No data recorded  Mood and Affect  Mood:-- (OK)  Affect:Congruent; Full Range   Thought Process  Thought Processes:Coherent; Goal Directed  Descriptions of Associations:Intact  Orientation:Full (Time, Place and Person)  Thought Content:-- (devoid of delusions/paranoia)  Hallucinations:Hallucinations:  None  Ideas of Reference:None  Suicidal Thoughts:Suicidal Thoughts: No  Homicidal Thoughts:Homicidal Thoughts: No   Sensorium  Memory:Immediate Fair; Recent Good; Remote Fair  Judgment:Fair  Insight:Fair   Executive Functions  Concentration:Fair  Attention Span:Good  Recall:Fair  Fund of Knowledge:Fair  Language:Fair   Psychomotor Activity  Psychomotor Activity:Psychomotor Activity: Normal   Assets  Assets:Communication Skills; Desire for Improvement   Sleep  Sleep:Sleep: Fair    Physical Exam: Vital signs:  Temp:  [97.5 F (36.4 C)-98.1 F (36.7 C)] 98 F (36.7 C) (11/20 1625) Pulse Rate:  [80-98] 91 (11/20 1625) Resp:  [16-18] 16 (11/20 1625) BP: (117-139)/(70-93) 127/73 (11/20 1625) SpO2:  [95 %-99 %] 98 % (11/20 1625) Physical Exam Constitutional:      Comments: Ill appearing   Pulmonary:     Effort: Pulmonary effort is normal.  Neurological:     Mental Status: He is oriented to person, place, and time.     Blood pressure 127/73, pulse 91, temperature 98 F (36.7 C), temperature source Oral, resp. rate 16, height 5\' 11"  (1.803 m), weight 90 kg, SpO2 98%. Body mass index is 27.67 kg/m.   Other History   These have been pulled in through the EMR, reviewed, and updated if appropriate.  Family History:  The patient's family history includes Heart disease in his father and mother.  Medical History: Past Medical History:  Diagnosis Date   Allergic reaction to contrast dye    Bipolar affective disorder (HCC)    Coronary atherosclerosis of native coronary artery    BMS to RCA 2008, [facility], Dr. 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     Surgical History: Past Surgical History:  Procedure Laterality Date    CHOLECYSTECTOMY  2013   JOINT REPLACEMENT     LEFT HEART CATH AND CORONARY ANGIOGRAPHY N/A 04/12/2023   Procedure: LEFT HEART CATH AND CORONARY ANGIOGRAPHY;  Surgeon: [name], MD;  Location: [facility] INVASIVE CV LAB;  Service: Cardiovascular;  Laterality: N/A;   ORIF FEMUR FRACTURE Right 11/20/2022   Procedure: OPEN REDUCTION INTERNAL FIXATION (ORIF) DISTAL FEMUR FRACTURE;  Surgeon: Roby Lofts, MD;  Location: [facility] OR;  Service: Orthopedics;  Laterality: Right;   TOTAL KNEE ARTHROPLASTY     Left   TOTAL KNEE ARTHROPLASTY Right 10/21/2012   Dr Madelon Lips   TOTAL KNEE ARTHROPLASTY Right 10/21/2012   Procedure: TOTAL KNEE ARTHROPLASTY;  Surgeon: Thera Flake., MD;  Location: [facility] OR;  Service: Orthopedics;  Laterality: Right;   VASECTOMY      Medications:   Current Facility-Administered Medications:    acetaminophen (TYLENOL) tablet 650 mg, 650 mg, Oral, Q4H PRN, [name], MD, 650 mg at 04/21/23 1609   alum & mag hydroxide-simeth (MAALOX/MYLANTA) 200-200-20 MG/5ML suspension 15 mL, 15 mL, Oral, Q6H PRN, Jerald Kief, MD, 15 mL at 04/19/23 0156   amoxicillin-clavulanate (AUGMENTIN) 875-125 MG per tablet 1 tablet, 1 tablet, Oral, Q12H, Meuth, Brooke A, PA-C, 1 tablet at 04/21/23 1023   aspirin EC tablet 81 mg, 81 mg, Oral, Daily, [name], MD, 81 mg at 04/21/23 1023   atorvastatin (LIPITOR) tablet 80 mg, 80 mg, Oral, Daily, Marcelino Duster, [place], 80 mg at 04/21/23 1023   clopidogrel (PLAVIX) tablet 75 mg, 75 mg, Oral, Daily, Hilty, Lisette Abu, MD, 75 mg at 04/21/23 1023   diclofenac Sodium (VOLTAREN) 1 % topical gel 2 g, 2 g, Topical, QID, [name], MD, 2 g at 04/21/23 1337   diphenhydrAMINE (BENADRYL) injection 25 mg, 25 mg, Intravenous, Once PRN, Meuth, Brooke A, PA-C   doxycycline (VIBRA-TABS) tablet 100 mg, 100 mg, Oral, Q12H, Meuth, Brooke A, PA-C, 100 mg at 04/21/23 1023   feeding supplement (GLUCERNA SHAKE) (GLUCERNA SHAKE) liquid 237 mL, 237 mL, Oral, TID BM,  [name], MD, 237 mL at 04/21/23 1337   fluvoxaMINE (LUVOX) tablet 100 mg, 100 mg, Oral, Daily, Jemya Depierro A, 100 mg at 04/21/23 1023   heparin injection 5,000 Units, 5,000 Units, Subcutaneous, Q8H, Hilty, Lisette Abu, MD, 5,000 Units at 04/21/23 1340   HYDROcodone-acetaminophen (NORCO) 7.5-325 MG per tablet 1 tablet, 1 tablet, Oral, Q6H PRN, [name], MD, 1 tablet at 04/21/23 1340   insulin aspart (novoLOG) injection 0-15 Units, 0-15 Units, Subcutaneous, TID WC, [name], MD, 2 Units at 04/21/23 1651   insulin aspart (novoLOG) injection 0-5 Units, 0-5 Units, Subcutaneous, QHS, [name], MD   insulin aspart (novoLOG) injection 4 Units, 4 Units, Subcutaneous, TID WC, [name], MD, 4 Units at  04/21/23 1651   insulin glargine-yfgn (SEMGLEE) injection 10 Units, 10 Units, Subcutaneous, BID, Marinda Elk, MD, 10 Units at 04/21/23 1024   melatonin tablet 10 mg, 10 mg, Oral, QHS, [name], MD, 10 mg at 04/20/23 2146   metoprolol succinate (TOPROL-XL) 24 hr tablet 100 mg, 100 mg, Oral, Daily, [name], MD, 100 mg at 04/21/23 1023   nitroGLYCERIN (NITROSTAT) SL tablet 0.4 mg, 0.4 mg, Sublingual, Q5 Min x 3 PRN, [name], MD   omega-3 acid ethyl esters (LOVAZA) capsule 1 g, 1 g, Oral, BID, Maliyah Willets A, 1 g at 04/21/23 1023   ondansetron (ZOFRAN) injection 4 mg, 4 mg, Intravenous, Q6H PRN, [name], MD   pantoprazole (PROTONIX) EC tablet 40 mg, 40 mg, Oral, Daily, [name], MD, 40 mg at 04/21/23 1023   polyethylene glycol (MIRALAX / GLYCOLAX) packet 17 g, 17 g, Oral, Daily PRN, [name], MD   traZODone (DESYREL) tablet 150 mg, 150 mg, Oral, Mickel Crow, MD, 150 mg at 04/20/23 2146  Allergies: Allergies  Allergen Reactions   Iodinated Contrast Media Hives and Itching

## 2023-04-21 NOTE — Progress Notes (Signed)
AUTHUR LAUGHEAD  ONG:[id number] DOB: Oct 25, 1950 DOA: 04/09/2023 PCP: [name], MD    Brief Narrative:  72 year old with a history of HTN, DM2, CAD status post PCI, and BPH who presented to [facility] with a week of nonbilious vomiting and shortness of breath.  While there he was found to have diffuse ST segment depression with markedly elevated troponins and was therefore transferred to [facility].    At [facility] TTE noted an EF of 40% with an inferior apical area which appeared to be dyskinetic.  Cardiac cath revealed multivessel disease with recommendation for CABG.  During this hospitalization the patient was found to have suffered a posterior circulation stroke and therefore is not currently a candidate for CABG.  Goals of Care:   Code Status: Full Code   DVT prophylaxis: heparin injection 5,000 Units Start: 04/16/23 1400   Interim Hx: Alert and conversant.  Resting comfortably in bed.  Complains of pain in his right wrist, and states that he fell on this prior to his initial hospitalization.  Denies chest pain shortness of breath fevers or chills.  Assessment & Plan:  NSTEMI Left heart cath 04/12/2023 noted severe left main disease with recommendation for CABG -unfortunately with his subsequent CVA he is not currently a CABG candidate -continue DAPT  Acute right paramedian pons, right cerebellar, right occipital lobe, and right posterior temporal lobe CVAs Newly diagnosed this visit -care as per Neurology -to follow-up with [facility] in 4 weeks  HTN Blood pressure currently controlled  Iron deficiency anemia Patient cannot recall his last colonoscopy -will need outpatient follow-up  HLD Continue statin  DM2 with CAD CBG well-controlled  Overweight - Body mass index is 27.67 kg/m.  BPH Continue usual home medications  Recent suicidal ideation During his admission the patient voices thoughts of wanting to die and harming himself -psychiatry has evaluated  with recommendation for fluvoxamine and trazodone with discontinuation of Seroquel -patient subsequently felt to be low risk  Sacral wound present on admission Has been evaluated by general surgery who feels the wound is currently improving -plan is to complete 15 days of antibiotic coverage  Family Communication: No family present at time of exam Disposition: Patient will need SNF for rehab stay   Objective: Blood pressure (!) 139/93, pulse 93, temperature 98 F (36.7 C), temperature source Oral, resp. rate 16, height 5\' 11"  (1.803 m), weight 90 kg, SpO2 96%.  Intake/Output Summary (Last 24 hours) at 04/21/2023 0919 Last data filed at 04/21/2023 0550 Gross per 24 hour  Intake --  Output 5300 ml  Net -5300 ml   Filed Weights   04/10/23 0533 04/19/23 0551  Weight: 97.4 kg 90 kg    Examination: General: No acute respiratory distress Lungs: Clear to auscultation bilaterally without wheezes or crackles Cardiovascular: Regular rate and rhythm without murmur gallop or rub normal S1 and S2 Abdomen: Nontender, nondistended, soft, bowel sounds positive, no rebound, no ascites, no appreciable mass Extremities: No significant cyanosis, clubbing, or edema bilateral lower extremities  CBC: Recent Labs  Lab 04/18/23 0446 04/19/23 0732 04/21/23 0541  WBC 11.6* 10.9* 9.2  HGB 12.7* 13.0 12.7*  HCT 38.3* 42.0 39.8  MCV 76.4* 78.7* 78.8*  PLT 239 279 261   Basic Metabolic Panel: Recent Labs  Lab 04/16/23 0534 04/17/23 0124 04/18/23 0446 04/21/23 0541  NA 136 138 138 138  K 3.4* 4.1 4.3 4.1  CL 106 109 107 109  CO2 21* 22 22 22   GLUCOSE 187* 146* 132*  138*  BUN 15 14 15 13   CREATININE 0.80 0.74 0.73 0.70  CALCIUM 8.2* 8.5* 8.7* 8.8*  MG 1.9  --   --   --    GFR: Estimated Creatinine Clearance: 88.9 mL/min (by C-G formula based on SCr of 0.7 mg/dL).   Scheduled Meds:   stroke: early stages of recovery book   Does not apply Once   amoxicillin-clavulanate  1 tablet Oral  Q12H   aspirin EC  81 mg Oral Daily   atorvastatin  80 mg Oral Daily   clopidogrel  75 mg Oral Daily   diclofenac Sodium  2 g Topical QID   doxycycline  100 mg Oral Q12H   feeding supplement (GLUCERNA SHAKE)  237 mL Oral TID BM   fluvoxaMINE  100 mg Oral Daily   heparin injection (subcutaneous)  5,000 Units Subcutaneous Q8H   insulin aspart  0-15 Units Subcutaneous TID WC   insulin aspart  0-5 Units Subcutaneous QHS   insulin aspart  4 Units Subcutaneous TID WC   insulin glargine-yfgn  10 Units Subcutaneous BID   melatonin  10 mg Oral QHS   metoprolol succinate  100 mg Oral Daily   omega-3 acid ethyl esters  1 g Oral BID   pantoprazole  40 mg Oral Daily   sodium chloride flush  3 mL Intravenous Q12H   traZODone  150 mg Oral QHS     LOS: 12 days   Lonia Blood, MD [facility] Office  [phone] Pager - Text Page per Loretha Stapler  If 7PM-7AM, please contact night-coverage per Amion 04/21/2023, 9:19 AM

## 2023-04-21 NOTE — Progress Notes (Signed)
Mobility Specialist Progress Note:    04/21/23 1529  Mobility  Activity Transferred from chair to bed  Level of Assistance +2 (takes two people) Audiological scientist)  Assistive Device Stedy  Activity Response Tolerated fair  Mobility Referral Yes  $Mobility charge 1 Mobility  Mobility Specialist Start Time (ACUTE ONLY) 1315  Mobility Specialist Stop Time (ACUTE ONLY) 1330  Mobility Specialist Time Calculation (min) (ACUTE ONLY) 15 min   Pt received in chair requesting to get back to bed. Needed MinA +2 to stand and transfer to bed via stedy. Was slightly impulsive. No c/o throughout. Situated in bed w/ call bell and personal belongings in bed w/ all needs met.  Thompson Grayer Mobility Specialist  Please contact vis Secure Chat or  Rehab Office [phone]

## 2023-04-22 DIAGNOSIS — R079 Chest pain, unspecified: Secondary | ICD-10-CM | POA: Diagnosis not present

## 2023-04-22 DIAGNOSIS — R41841 Cognitive communication deficit: Secondary | ICD-10-CM | POA: Diagnosis not present

## 2023-04-22 DIAGNOSIS — F419 Anxiety disorder, unspecified: Secondary | ICD-10-CM | POA: Diagnosis not present

## 2023-04-22 DIAGNOSIS — I25119 Atherosclerotic heart disease of native coronary artery with unspecified angina pectoris: Secondary | ICD-10-CM | POA: Diagnosis not present

## 2023-04-22 DIAGNOSIS — F32A Depression, unspecified: Secondary | ICD-10-CM | POA: Diagnosis not present

## 2023-04-22 DIAGNOSIS — M6281 Muscle weakness (generalized): Secondary | ICD-10-CM | POA: Diagnosis not present

## 2023-04-22 DIAGNOSIS — R531 Weakness: Secondary | ICD-10-CM | POA: Diagnosis not present

## 2023-04-22 DIAGNOSIS — E782 Mixed hyperlipidemia: Secondary | ICD-10-CM | POA: Diagnosis not present

## 2023-04-22 DIAGNOSIS — L89154 Pressure ulcer of sacral region, stage 4: Secondary | ICD-10-CM | POA: Diagnosis not present

## 2023-04-22 DIAGNOSIS — I2510 Atherosclerotic heart disease of native coronary artery without angina pectoris: Secondary | ICD-10-CM | POA: Diagnosis not present

## 2023-04-22 DIAGNOSIS — I639 Cerebral infarction, unspecified: Secondary | ICD-10-CM | POA: Diagnosis not present

## 2023-04-22 DIAGNOSIS — I214 Non-ST elevation (NSTEMI) myocardial infarction: Secondary | ICD-10-CM | POA: Diagnosis not present

## 2023-04-22 DIAGNOSIS — M533 Sacrococcygeal disorders, not elsewhere classified: Secondary | ICD-10-CM | POA: Diagnosis not present

## 2023-04-22 DIAGNOSIS — I5020 Unspecified systolic (congestive) heart failure: Secondary | ICD-10-CM | POA: Diagnosis not present

## 2023-04-22 DIAGNOSIS — E785 Hyperlipidemia, unspecified: Secondary | ICD-10-CM | POA: Diagnosis not present

## 2023-04-22 DIAGNOSIS — Z7401 Bed confinement status: Secondary | ICD-10-CM | POA: Diagnosis not present

## 2023-04-22 DIAGNOSIS — Z0389 Encounter for observation for other suspected diseases and conditions ruled out: Secondary | ICD-10-CM | POA: Diagnosis not present

## 2023-04-22 DIAGNOSIS — E119 Type 2 diabetes mellitus without complications: Secondary | ICD-10-CM | POA: Diagnosis not present

## 2023-04-22 DIAGNOSIS — R278 Other lack of coordination: Secondary | ICD-10-CM | POA: Diagnosis not present

## 2023-04-22 DIAGNOSIS — R1312 Dysphagia, oropharyngeal phase: Secondary | ICD-10-CM | POA: Diagnosis not present

## 2023-04-22 LAB — BASIC METABOLIC PANEL
Anion gap: 10 (ref 5–15)
BUN: 16 mg/dL (ref 8–23)
CO2: 21 mmol/L — ABNORMAL LOW (ref 22–32)
Calcium: 8.9 mg/dL (ref 8.9–10.3)
Chloride: 105 mmol/L (ref 98–111)
Creatinine, Ser: 0.74 mg/dL (ref 0.61–1.24)
GFR, Estimated: >60 mL/min (ref >60)
Glucose, Bld: 155 mg/dL — ABNORMAL HIGH (ref 70–99)
Potassium: 4.4 mmol/L (ref 3.5–5.1)
Sodium: 136 mmol/L (ref 135–145)

## 2023-04-22 LAB — URIC ACID: Uric Acid, Serum: 4.2 mg/dL (ref 3.7–8.6)

## 2023-04-22 LAB — GLUCOSE, CAPILLARY
Glucose-Capillary: 150 mg/dL — ABNORMAL HIGH (ref 70–99)
Glucose-Capillary: 175 mg/dL — ABNORMAL HIGH (ref 70–99)

## 2023-04-22 MED ORDER — DOXYCYCLINE HYCLATE 100 MG PO TABS: 100.0000 mg | ORAL_TABLET | Freq: Two times a day (BID) | ORAL | Status: AC

## 2023-04-22 MED ORDER — MELATONIN 10 MG PO TABS: 10.0000 mg | ORAL_TABLET | Freq: Every day | ORAL | Status: DC

## 2023-04-22 MED ORDER — ACETAMINOPHEN 325 MG PO TABS: 650.0000 mg | ORAL_TABLET | ORAL | Status: DC | PRN

## 2023-04-22 MED ORDER — HYDROCODONE-ACETAMINOPHEN 7.5-325 MG PO TABS: 1.0000 | ORAL_TABLET | Freq: Four times a day (QID) | ORAL | 0 refills | Status: DC | PRN

## 2023-04-22 MED ORDER — TRAZODONE HCL 150 MG PO TABS: 150.0000 mg | ORAL_TABLET | Freq: Every day | ORAL | Status: DC

## 2023-04-22 MED ORDER — METOPROLOL SUCCINATE ER 100 MG PO TB24: 100.0000 mg | ORAL_TABLET | Freq: Every day | ORAL | Status: DC

## 2023-04-22 MED ORDER — DICLOFENAC SODIUM 1 % EX GEL: 2.0000 g | Freq: Four times a day (QID) | CUTANEOUS | Status: DC

## 2023-04-22 MED ORDER — ATORVASTATIN CALCIUM 80 MG PO TABS: 80.0000 mg | ORAL_TABLET | Freq: Every day | ORAL | Status: DC

## 2023-04-22 MED ORDER — INSULIN GLARGINE-YFGN 100 UNIT/ML ~~LOC~~ SOLN: 10.0000 [IU] | Freq: Two times a day (BID) | SUBCUTANEOUS | Status: DC

## 2023-04-22 MED ORDER — CLOPIDOGREL BISULFATE 75 MG PO TABS: 75.0000 mg | ORAL_TABLET | Freq: Every day | ORAL | Status: DC

## 2023-04-22 MED ORDER — OMEGA-3-ACID ETHYL ESTERS 1 G PO CAPS: 1.0000 g | ORAL_CAPSULE | Freq: Two times a day (BID) | ORAL | Status: DC

## 2023-04-22 MED ORDER — ALUM & MAG HYDROXIDE-SIMETH 200-200-20 MG/5ML PO SUSP: 15.0000 mL | Freq: Four times a day (QID) | ORAL | Status: DC | PRN

## 2023-04-22 MED ORDER — FLUVOXAMINE MALEATE 100 MG PO TABS: 100.0000 mg | ORAL_TABLET | Freq: Every day | ORAL | Status: DC

## 2023-04-22 MED ORDER — POLYETHYLENE GLYCOL 3350 17 G PO PACK: 17.0000 g | PACK | Freq: Every day | ORAL | Status: DC | PRN

## 2023-04-22 MED ORDER — AMOXICILLIN-POT CLAVULANATE 875-125 MG PO TABS: 1.0000 | ORAL_TABLET | Freq: Two times a day (BID) | ORAL | Status: AC

## 2023-04-22 MED ORDER — NITROGLYCERIN 0.4 MG SL SUBL: 0.4000 mg | SUBLINGUAL_TABLET | SUBLINGUAL | Status: DC | PRN

## 2023-04-22 MED ORDER — FLEET ENEMA RE ENEM
1.0000 | ENEMA | Freq: Every day | RECTAL | Status: DC | PRN
  Administered 2023-04-22: 1 via RECTAL
  Filled 2023-04-22: qty 1

## 2023-04-22 MED ORDER — ONDANSETRON HCL 4 MG/2ML IJ SOLN: 4.0000 mg | Freq: Four times a day (QID) | INTRAMUSCULAR | Status: DC | PRN

## 2023-04-22 NOTE — TOC Transition Note (Signed)
Transition of Care [facility]) - CM/SW Discharge Note   Patient Details  Name: Michael Rose MRN: [id number] Date of Birth: 02/08/1951  Transition of Care [facility]) CM/SW Contact:  Delilah Shan, LCSWA Phone Number: 04/22/2023, 1:04 PM   Clinical Narrative:     Patient will DC to: [facility]  Anticipated DC date: 04/22/2023  Family notified: Kathie Rhodes  Transport by: Sharin Mons  ?  Per MD patient ready for DC to [facility] . RN, patient, patient's family, and facility notified of DC. Discharge Summary sent to facility. RN given number for report tele# [phone] RM# [phone]. DC packet on chart. Ambulance transport requested for patient.  CSW signing off.   Final next level of care: Skilled Nursing Facility Barriers to Discharge: No Barriers Identified   Patient Goals and CMS Choice CMS Medicare.gov Compare Post Acute Care list provided to:: Patient Choice offered to / list presented to : Patient, Spouse  Discharge Placement                Patient chooses bed at:  [facility]) Patient to be transferred to facility by: [facility] Name of family member notified: Kathie Rhodes Patient and family notified of of transfer: 04/22/23  Discharge Plan and Services Additional resources added to the After Visit Summary for   In-house Referral: Clinical Social Work   Post Acute Care Choice: Skilled Nursing Facility                               Social Determinants of Health (SDOH) Interventions SDOH Screenings   Food Insecurity: No Food Insecurity (04/10/2023)  Housing: Low Risk  (04/10/2023)  Transportation Needs: No Transportation Needs (04/10/2023)  Utilities: Not At Risk (04/10/2023)  Financial Resource Strain: High Risk (01/19/2022)  Physical Activity: Insufficiently Active (07/21/2022)   Received from [facility], [facility]  Social Connections: Socially Integrated (07/21/2022)   Received from [facility], [facility]  Stress: No Stress Concern Present (07/21/2022)   Received  from [facility], [facility]  Tobacco Use: Low Risk  (04/10/2023)  Health Literacy: Low Risk  (07/21/2022)   Received from [facility], [facility]     Readmission Risk Interventions     No data to display

## 2023-04-22 NOTE — Discharge Summary (Signed)
DISCHARGE SUMMARY  Michael Rose  MR#: [id number]  DOB:[birth year]  Date of Admission: 04/09/2023 Date of Discharge: 04/22/2023  Attending Physician:Zimir Kittleson Silvestre Gunner, MD  Patient's NWG:NFAO, Beatrix Fetters, MD  Disposition: D/C to SNF for rehab stay   Follow-up Appts:  Contact information for follow-up providers     [facility]. Schedule an appointment as soon as possible for a visit in 1 month(s).   Specialty: Neurology Why: stroke clinic Contact information: [address] [place] [address] [phone]             Contact information for after-discharge care     Destination     [facility] .   Service: Skilled Nursing Contact information: [address] [place] [address] [phone]                     Tests Needing Follow-up: -when medically stable an outpatient screening colonoscopy to evaluate his anemia is indicated  -routine monitoring of CBG is suggested  -follow up exam of his gluteal cleft/sacral wound will be required   Discharge Diagnoses: NSTEMI Acute right paramedian pons, right cerebellar, right occipital lobe, and right posterior temporal lobe CVAs HTN Iron deficiency anemia HLD DM2 with CAD Overweight - Body mass index is 27.67 kg/m. BPH Transient suicidal ideation Sacral wound present on admission  Wound Care: Loosely pack sacral wound with single piece of gauze, wound tunnels distally. Apply foam pad over top. Change twice daily.    Initial presentation: 72 year old with a history of HTN, DM2, CAD status post PCI, and BPH who presented to [facility] with a week of nonbilious vomiting and shortness of breath.  While there he was found to have diffuse ST segment depression with markedly elevated troponins and was therefore transferred to [facility].     At [facility] TTE noted an EF of 40% with a dyskinetic inferior  apical area. Cardiac cath revealed multivessel disease with recommendation for CABG.  During this hospitalization the patient was found to have suffered a posterior circulation stroke and therefore is not currently a candidate for CABG.  Hospital Course:  NSTEMI Left heart cath 04/12/2023 noted severe left main disease with recommendation for CABG - unfortunately with his subsequent CVA he is not currently a CABG candidate - continue DAPT with outpt Cardiology follow up    Acute right paramedian pons, right cerebellar, right occipital lobe, and right posterior temporal lobe CVAs Newly diagnosed this visit - scattered posterior circulation infarcts in an embolic pattern could be related to cardiac cath procedure versus large vessel disease in the setting of hypotension - care as per Neurology - to follow-up with [facility] in 4 weeks   HTN Blood pressure currently controlled   Iron deficiency anemia Patient cannot recall his last colonoscopy - will need outpatient follow-up   HLD Continue statin - LDL 51   DM2 with CAD CBG well-controlled - A1c 5.9   Overweight - Body mass index is 27.67 kg/m.   BPH Continue usual home medications   Recent suicidal ideation During his admission the patient voiced thoughts of wanting to die - psychiatry evaluated with recommendation for fluvoxamine and trazodone with discontinuation of Seroquel - patient subsequently felt to be low risk, and his mood significantly improved    Sacral wound present on admission Has been evaluated by general surgery who feels the wound is currently improving -plan is to complete 15 days of antibiotic coverage  and ongoing wound care     Allergies as of 04/22/2023       Reactions   Iodinated Contrast Media Hives, Itching        Medication List     STOP taking these medications    furosemide 40 MG tablet Commonly known as: LASIX   IBU 800 MG tablet Generic drug: ibuprofen   isosorbide mononitrate 30 MG 24 hr  tablet Commonly known as: IMDUR   Jardiance 25 MG Tabs tablet Generic drug: empagliflozin   levocetirizine 5 MG tablet Commonly known as: XYZAL   methocarbamol 500 MG tablet Commonly known as: ROBAXIN   NovoLIN N 100 UNIT/ML injection Generic drug: insulin NPH Human   Ozempic (1 MG/DOSE) 4 MG/3ML Sopn Generic drug: Semaglutide (1 MG/DOSE)   Tresiba 100 UNIT/ML Soln Generic drug: Insulin Degludec       TAKE these medications    acetaminophen 325 MG tablet Commonly known as: TYLENOL Take 2 tablets (650 mg total) by mouth every 4 (four) hours as needed for headache, mild pain (pain score 1-3) or fever.   alum & mag hydroxide-simeth 200-200-20 MG/5ML suspension Commonly known as: MAALOX/MYLANTA Take 15 mLs by mouth every 6 (six) hours as needed for indigestion or heartburn.   amoxicillin-clavulanate 875-125 MG tablet Commonly known as: AUGMENTIN Take 1 tablet by mouth every 12 (twelve) hours for 8 days.   aspirin 81 MG tablet Take 81 mg by mouth daily.   atorvastatin 80 MG tablet Commonly known as: LIPITOR Take 1 tablet (80 mg total) by mouth daily. Start taking on: April 23, 2023 What changed:  medication strength how much to take   clopidogrel 75 MG tablet Commonly known as: PLAVIX Take 1 tablet (75 mg total) by mouth daily. Start taking on: April 23, 2023   diclofenac Sodium 1 % Gel Commonly known as: VOLTAREN Apply 2 g topically 4 (four) times daily.   doxycycline 100 MG tablet Commonly known as: VIBRA-TABS Take 1 tablet (100 mg total) by mouth every 12 (twelve) hours for 8 days.   fluvoxaMINE 100 MG tablet Commonly known as: LUVOX Take 1 tablet (100 mg total) by mouth daily. Start taking on: April 23, 2023 What changed: how much to take   HYDROcodone-acetaminophen 7.5-325 MG tablet Commonly known as: NORCO Take 1 tablet by mouth every 6 (six) hours as needed for severe pain (pain score 7-10). What changed: reasons to take this    insulin glargine-yfgn 100 UNIT/ML injection Commonly known as: SEMGLEE Inject 0.1 mLs (10 Units total) into the skin 2 (two) times daily.   Melatonin 10 MG Tabs Take 10 mg by mouth at bedtime.   metoprolol succinate 100 MG 24 hr tablet Commonly known as: TOPROL-XL Take 1 tablet (100 mg total) by mouth daily. Take with or immediately following a meal. Start taking on: April 23, 2023 What changed:  medication strength how much to take how to take this when to take this additional instructions   nitroGLYCERIN 0.4 MG SL tablet Commonly known as: NITROSTAT Place 1 tablet (0.4 mg total) under the tongue every 5 (five) minutes x 3 doses as needed for chest pain.   omega-3 acid ethyl esters 1 g capsule Commonly known as: LOVAZA Take 1 capsule (1 g total) by mouth 2 (two) times daily.   ondansetron 4 MG/2ML Soln injection Commonly known as: ZOFRAN Inject 2 mLs (4 mg total) into the vein every 6 (six) hours as needed for nausea.   pantoprazole 40 MG tablet Commonly known as:  PROTONIX Take 40 mg by mouth daily.   polyethylene glycol 17 g packet Commonly known as: MIRALAX / GLYCOLAX Take 17 g by mouth daily as needed for mild constipation, moderate constipation or severe constipation.   traZODone 150 MG tablet Commonly known as: DESYREL Take 1 tablet (150 mg total) by mouth at bedtime.        Day of Discharge BP 135/80   Pulse 92   Temp 97.9 F (36.6 C) (Oral)   Resp 18   Ht 5\' 11"  (1.803 m)   Wt 90 kg   SpO2 98%   BMI 27.67 kg/m   Physical Exam: General: No acute respiratory distress Lungs: Clear to auscultation bilaterally without wheezes or crackles Cardiovascular: Regular rate and rhythm without murmur gallop or rub normal S1 and S2 Abdomen: Nontender, nondistended, soft, bowel sounds positive, no rebound, no ascites, no appreciable mass Extremities: No significant cyanosis, clubbing, or edema bilateral lower extremities  Basic Metabolic Panel: Recent  Labs  Lab 04/16/23 0534 04/17/23 0124 04/18/23 0446 04/21/23 0541 04/22/23 0407  NA 136 138 138 138 136  K 3.4* 4.1 4.3 4.1 4.4  CL 106 109 107 109 105  CO2 21* 22 22 22  21*  GLUCOSE 187* 146* 132* 138* 155*  BUN 15 14 15 13 16   CREATININE 0.80 0.74 0.73 0.70 0.74  CALCIUM 8.2* 8.5* 8.7* 8.8* 8.9  MG 1.9  --   --   --   --     CBC: Recent Labs  Lab 04/16/23 0534 04/17/23 0124 04/18/23 0446 04/19/23 0732 04/21/23 0541  WBC 10.1 12.4* 11.6* 10.9* 9.2  HGB 12.1* 12.1* 12.7* 13.0 12.7*  HCT 38.6* 37.8* 38.3* 42.0 39.8  MCV 77.7* 76.5* 76.4* 78.7* 78.8*  PLT 248 245 239 279 261    Time spent in discharge (includes decision making & examination of pt): 35 minutes  04/22/2023, 12:39 PM   Lonia Blood, MD [facility] Office  [phone]

## 2023-04-22 NOTE — Progress Notes (Signed)
RN spoke with Christin at [facility] on report for pt

## 2023-04-22 NOTE — TOC Progression Note (Signed)
Transition of Care [facility]) - Progression Note    Patient Details  Name: Michael Rose MRN: [id number] Date of Birth: February 23, 1951  Transition of Care [facility] CM/SW Contact  Delilah Shan, LCSWA Phone Number: 04/22/2023, 10:12 AM  Clinical Narrative:     Patients insurance authorization has been approved for SNF and PTAR. SNF auth approval number # [id number] and PTAR approval # [id number]. Patient has SNF bed at [facility]. CSW informed MD. CSW will continue to follow.   Expected Discharge Plan: Skilled Nursing Facility Barriers to Discharge: Continued Medical Work up  Expected Discharge Plan and Services In-house Referral: Clinical Social Work   Post Acute Care Choice: Skilled Nursing Facility Living arrangements for the past 2 months: Skilled Nursing Facility                                       Social Determinants of Health (SDOH) Interventions SDOH Screenings   Food Insecurity: No Food Insecurity (04/10/2023)  Housing: Low Risk  (04/10/2023)  Transportation Needs: No Transportation Needs (04/10/2023)  Utilities: Not At Risk (04/10/2023)  Financial Resource Strain: High Risk (01/19/2022)  Physical Activity: Insufficiently Active (07/21/2022)   [facility] from [facility], [facility]  Social Connections: Socially Integrated (07/21/2022)   Received from [facility], [facility]  Stress: No Stress Concern Present (07/21/2022)   Received from [facility], [facility]  Tobacco Use: Low Risk  (04/10/2023)  Health Literacy: Low Risk  (07/21/2022)   Received from [facility], [facility]    Readmission Risk Interventions     No data to display

## 2023-04-28 DIAGNOSIS — I639 Cerebral infarction, unspecified: Secondary | ICD-10-CM | POA: Diagnosis not present

## 2023-04-28 DIAGNOSIS — L89154 Pressure ulcer of sacral region, stage 4: Secondary | ICD-10-CM | POA: Diagnosis not present

## 2023-04-28 DIAGNOSIS — E119 Type 2 diabetes mellitus without complications: Secondary | ICD-10-CM | POA: Diagnosis not present

## 2023-05-05 DIAGNOSIS — E119 Type 2 diabetes mellitus without complications: Secondary | ICD-10-CM | POA: Diagnosis not present

## 2023-05-05 DIAGNOSIS — I639 Cerebral infarction, unspecified: Secondary | ICD-10-CM | POA: Diagnosis not present

## 2023-05-05 DIAGNOSIS — L89154 Pressure ulcer of sacral region, stage 4: Secondary | ICD-10-CM | POA: Diagnosis not present

## 2023-05-12 DIAGNOSIS — L89154 Pressure ulcer of sacral region, stage 4: Secondary | ICD-10-CM | POA: Diagnosis not present

## 2023-05-12 DIAGNOSIS — I639 Cerebral infarction, unspecified: Secondary | ICD-10-CM | POA: Diagnosis not present

## 2023-05-12 DIAGNOSIS — E119 Type 2 diabetes mellitus without complications: Secondary | ICD-10-CM | POA: Diagnosis not present

## 2023-05-19 ENCOUNTER — Encounter: Payer: Self-pay | Admitting: Cardiology

## 2023-05-19 ENCOUNTER — Ambulatory Visit: Payer: PPO | Attending: Cardiology | Admitting: Cardiology

## 2023-05-19 VITALS — BP 126/70 | HR 68 | Wt 198.0 lb

## 2023-05-19 DIAGNOSIS — E782 Mixed hyperlipidemia: Secondary | ICD-10-CM

## 2023-05-19 DIAGNOSIS — I25119 Atherosclerotic heart disease of native coronary artery with unspecified angina pectoris: Secondary | ICD-10-CM | POA: Diagnosis not present

## 2023-05-19 DIAGNOSIS — I5020 Unspecified systolic (congestive) heart failure: Secondary | ICD-10-CM

## 2023-05-19 NOTE — Progress Notes (Signed)
Cardiology Office Note  Date: 05/19/2023   ID: Michael Rose, DOB Jun 03, 1950, MRN [id number]  History of Present Illness: Michael Rose is a 72 y.o. male presenting for a posthospital follow-up.  I have not seen him since 2018.  I reviewed extensive records.  He was recently hospitalized with NSTEMI associated with LM/multivessel CAD, newly documented cardiomyopathy, also evidence of acute stroke (right paramedian pons, right cerebellar, right occipital lobe, and right posterior temporal lobe).  CABG was recommended however he was not felt to be a good candidate for surgery by TCTS in light of acute stroke and also concurrent sacral decubitus.  It was not felt that high risk PCI was indicated as well per review with the interventional team.  Medical therapy was recommended.  He is here today with his wife.  Currently in rehabilitation.  He is in a wheelchair today, complains of constipation and also having a lot of pain associated with his sacral decubitus.  He is on oxycodone.  I reviewed his cardiac regimen which includes aspirin, Plavix, Lipitor, Toprol-XL, Lovaza, and as needed nitroglycerin.  Vital signs are stable today.  Today we discussed the findings of his recent cardiac testing and plan for medical therapy at this point.  Surgical risk would be quite high.  Treatment course will largely depend on how he does with rehabilitation.  Physical Exam: VS:  BP 126/70   Pulse 68   Wt 198 lb (89.8 kg)   SpO2 98%   BMI 27.62 kg/m , BMI Body mass index is 27.62 kg/m.  Wt Readings from Last 3 Encounters:  05/19/23 198 lb (89.8 kg)  04/19/23 198 lb 6.6 oz (90 kg)  11/20/22 228 lb 13.4 oz (103.8 kg)    General: Patient appears comfortable at rest.  In wheelchair. HEENT: Conjunctiva and lids normal. Neck: Supple, no elevated JVP or carotid bruits. Lungs: Clear to auscultation, nonlabored breathing at rest. Cardiac: Regular rate and rhythm, no S3 or significant systolic murmur, no  pericardial rub. Abdomen: Bowel sounds present. Extremities: No pitting edema.  ECG:  An ECG dated 04/11/2023 was personally reviewed today and demonstrated:  Sinus rhythm with old inferior infarct pattern and diffuse ST depression.  Labwork: 04/16/2023: Magnesium 1.9 04/19/2023: TSH 2.473 04/21/2023: ALT 23; AST 18; Hemoglobin 12.7; Platelets 261 04/22/2023: BUN 16; Creatinine, Ser 0.74; Potassium 4.4; Sodium 136     Component Value Date/Time   CHOL 111 04/10/2023 0702   TRIG 94 04/10/2023 0702   HDL 41 04/10/2023 0702   CHOLHDL 2.7 04/10/2023 0702   VLDL 19 04/10/2023 0702   LDLCALC 51 04/10/2023 0702   Other Studies Reviewed Today:  Echocardiogram 04/10/2023:  1. No thrombus is seen (Definity contrast was used). Left ventricular  ejection fraction, by estimation, is 40 to 45%. The left ventricle has  mildly decreased function. The left ventricle demonstrates regional wall  motion abnormalities (see scoring  diagram/findings for description). Left ventricular diastolic parameters  are consistent with Grade I diastolic dysfunction (impaired relaxation). A  very small area of the inferoapical "cap" appears to be frankly  dyskinetic.   2. Right ventricular systolic function is normal. The right ventricular  size is normal. Tricuspid regurgitation signal is inadequate for assessing  PA pressure.   3. Left atrial size was moderately dilated.   4. The mitral valve is normal in structure. Mild to moderate mitral valve  regurgitation.   5. The aortic valve is tricuspid. There is mild calcification of the  aortic valve. There  is mild thickening of the aortic valve. Aortic valve  regurgitation is not visualized. Aortic valve sclerosis/calcification is  present, without any evidence of  aortic stenosis.   6. The inferior vena cava is dilated in size with <50% respiratory  variability, suggesting right atrial pressure of 15 mmHg.   Cardiac catheterization 04/12/2023: Severe left  main disease with diffuse 80-90% calcified stenosis Severe mid-LAD stenosis of 80% Critical ostial RCA stenosis with a 95-99% ulcerated plaque Nonobstructive LCx stenosis Mild-moderate segmental LV dysfunction with severely elevated LVEDP  Carotid Dopplers 04/14/2023: Summary:  Right Carotid: Velocities in the right ICA are consistent with a 1-39%  stenosis.   Left Carotid: The extracranial vessels were near-normal with only minimal  wall               thickening or plaque.   Vertebrals:  Bilateral vertebral arteries demonstrate antegrade flow.  Subclavians: Normal flow hemodynamics were seen in bilateral subclavian               arteries.   Assessment and Plan:  1.  CAD status post BMS to the RCA in 2008 with recently documented severe left main/multivessel disease at cardiac catheterization in November in the setting of NSTEMI.  CABG recommended as optimal revascularization strategy, however he was not felt to be a good surgical candidate by TCTS in light of acute stroke during hospitalization and also sacral decubitus.  High risk PCI was also not felt to be favorable by the interventional team.  Medical therapy recommended.  Fortunately, no angina at current level of activity.  He is undergoing rehabilitation at this time.  Currently on aspirin, Plavix, Toprol-XL, Lovaza, Lipitor, and as needed nitroglycerin.  Remains at relatively high risk for recurrent cardiac events, however not an optimal candidate for revascularization at this time.  2.  HFmrEF with ischemic cardiomyopathy, LVEF 40 to 45%.  Currently on Toprol XL.  Blood pressure was not aggressively lowered in light of acute stroke during his recent hospital stay.  Would hold off on addition of ARB/ARNI at this point.  Not a good candidate for SGLT2 inhibitor given increased infection risk.  Continue to follow.  3.  Nonrheumatic mitral regurgitation, mild to moderate.  4.  Mixed hyperlipidemia, recent LDL 51.  Continue  Lipitor.  5.  Type 2 diabetes mellitus, followed by PCP.  Disposition:  Follow up  6 weeks.  Signed, Jonelle Sidle, M.D., F.A.C.C. [facility] at [facility]

## 2023-05-19 NOTE — Patient Instructions (Signed)
Medication Instructions:  Your physician recommends that you continue on your current medications as directed. Please refer to the Current Medication list given to you today.   Labwork: None  Testing/Procedures: None  Follow-Up: Your physician recommends that you schedule a follow-up appointment in: 6 weeks  Any Other Special Instructions Will Be Listed Below (If Applicable).  Thank you for choosing [facility]!      If you need a refill on your cardiac medications before your next appointment, please call your pharmacy.

## 2023-05-20 NOTE — Progress Notes (Deleted)
[facility] [address] [place]. [place] [address] [phone]       HOSPITAL FOLLOW UP NOTE  [name] Date of Birth:  1951-04-22 Medical Record Number:  [id number]   Reason for Referral:  hospital stroke follow up    SUBJECTIVE:   CHIEF COMPLAINT:  No chief complaint on file.   HPI:   Michael Rose is a 72 y.o. who  has a past medical history of Allergic reaction to contrast dye, Bipolar affective disorder (HCC), Coronary atherosclerosis of native coronary artery, Diabetes mellitus without complication (HCC), GERD (gastroesophageal reflux disease), Hard of hearing, Hyperlipidemia, Hypertension, Myocardial infarction (HCC), Osteoarthritis, and Ringing in right ear.  Patient presented to [facility] on 04/08/2023 with vomiting and shortness of breath and elevated troponin. He was transferred to [facility]. Status post cardiac cath on 04/12/2023 found to have severe CAD. 1 day after cath, patient found to have slurred speech, left upper extremity ataxia, left facial droop. No tPA given due to outside window. LDL 51 on atorvastatin 80mg . DAPT with asa 81mg  and Plavix. Heparin per cardiology. Suicidal ideations expressed in hospital. Seroquel switched to fluvoxamine and trazodone. Gluteal cleft sacral wound present on admission. ABX x 15 days and wound care provided. Personally reviewed hospitalization pertinent progress notes, lab work and imaging. He was discharged to [facility] 04/22/2023. Evaluated by Dr Michael Rose.   Since discharge to rehab,   He continues atorvastatin 80mg , aspirin 81mg  and Plavix 81mg  daily. BP well managed on metoprolol XL 100mg  daily.   Wound?   SI? Psychiatry?   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  Small focus of acute infarction within the right cerebellar hemisphere.  2 small foci of acute infarction affecting the right posterior temporal lobe and right occipital  lobe. 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 Carotid Doppler unremarkable 2D Echo EF 40 to 45%   A1C Lab Results  Component Value Date   HGBA1C 6.4 (H) 04/16/2023    Lipid Panel     Component Value Date/Time   CHOL 111 04/10/2023 0702   TRIG 94 04/10/2023 0702   HDL 41 04/10/2023 0702   CHOLHDL 2.7 04/10/2023 0702   VLDL 19 04/10/2023 0702   LDLCALC 51 04/10/2023 0702      ROS:   14 system review of systems performed and negative with exception of those listed in HPI  PMH:  Past Medical History:  Diagnosis Date   Allergic reaction to contrast dye    Bipolar affective disorder (HCC)    Coronary atherosclerosis of native coronary artery    BMS to RCA 2008, [facility], Dr. Katrinka Rose   Diabetes mellitus without complication (HCC)    GERD (gastroesophageal reflux disease)    Hard of hearing    Right ear   Hyperlipidemia    Hypertension    Myocardial infarction (HCC)    IMI 2008   Osteoarthritis    Ringing in right ear     PSH:  Past Surgical History:  Procedure Laterality Date   CHOLECYSTECTOMY  2013   JOINT REPLACEMENT     LEFT HEART CATH AND CORONARY ANGIOGRAPHY N/A 04/12/2023   Procedure: LEFT HEART CATH AND CORONARY ANGIOGRAPHY;  Surgeon: Michael Bollman, MD;  Location: [facility] INVASIVE CV LAB;  Service: Cardiovascular;  Laterality: N/A;   ORIF FEMUR FRACTURE Right 11/20/2022   Procedure: OPEN REDUCTION INTERNAL FIXATION (ORIF) DISTAL FEMUR FRACTURE;  Surgeon: Michael Lofts, MD;  Location: [facility] OR;  Service: Orthopedics;  Laterality: Right;   TOTAL KNEE ARTHROPLASTY     Left   TOTAL KNEE ARTHROPLASTY Right 10/21/2012   Dr Michael Rose   TOTAL KNEE ARTHROPLASTY Right 10/21/2012   Procedure: TOTAL KNEE ARTHROPLASTY;  Surgeon: Michael Rose., MD;  Location: [facility] OR;  Service: Orthopedics;  Laterality: Right;   VASECTOMY      Social History:  Social History   Socioeconomic History   Marital status: Married    Spouse name: Not on file   Number of  children: Not on file   Years of education: Not on file   Highest education level: Not on file  Occupational History   Occupation: disabled due to arthritis    Employer: DISABLED  Tobacco Use   Smoking status: Never   Smokeless tobacco: Never  Vaping Use   Vaping status: Never Used  Substance and Sexual Activity   Alcohol use: No    Alcohol/week: 0.0 standard drinks of alcohol   Drug use: No   Sexual activity: Not on file  Other Topics Concern   Not on file  Social History Narrative   Married   Social Drivers of Health   Financial Resource Strain: High Risk (01/19/2022)   Overall Financial Resource Strain (CARDIA)    Difficulty of Paying Living Expenses: Hard  Food Insecurity: No Food Insecurity (04/10/2023)   Hunger Vital Sign    Worried About Running Out of Food in the Last Year: Never true    Ran Out of Food in the Last Year: Never true  Transportation Needs: No Transportation Needs (04/10/2023)   PRAPARE - Administrator, Civil Service (Medical): No    Lack of Transportation (Non-Medical): No  Physical Activity: Insufficiently Active (07/21/2022)   Received from [facility], [facility]   Exercise Vital Sign    Days of Exercise per Week: 4 days    Minutes of Exercise per Session: 30 min  Stress: No Stress Concern Present (07/21/2022)   Received from [facility], [facility] - Occupational Stress Questionnaire    Feeling of Stress : Only a little  Social Connections: Socially Integrated (07/21/2022)   [facility], [facility]   Social Connection and Isolation Panel [NHANES]    Frequency of Communication with Friends and Family: More than three times a week    Frequency of Social Gatherings with Friends and Family: More than three times a week    Attends Religious Services: More than 4 times per year    Active Member of [employer] or Organizations: Yes    Attends Hospital doctor: More than 4 times per year    Marital Status: Married  Catering manager Violence: Not At Risk (04/10/2023)   Humiliation, Afraid, Rape, and Kick questionnaire    Fear of Current or Ex-Partner: No    Emotionally Abused: No    Physically Abused: No    Sexually Abused: No    Family History:  Family History  Problem Relation Age of Onset   Heart disease Mother    Heart disease Father     Medications:   Current Outpatient Medications on File Prior to Visit  Medication Sig Dispense Refill   acetaminophen (TYLENOL) 325 MG tablet Take 2 tablets (650 mg total) by mouth every 4 (four) hours as needed for headache, mild pain (pain score 1-3) or fever.     alum &  mag hydroxide-simeth (MAALOX/MYLANTA) 200-200-20 MG/5ML suspension Take 15 mLs by mouth every 6 (six) hours as needed for indigestion or heartburn.     aspirin 81 MG tablet Take 81 mg by mouth daily.     atorvastatin (LIPITOR) 80 MG tablet Take 1 tablet (80 mg total) by mouth daily.     clonazePAM (KLONOPIN) 0.5 MG tablet Take 0.5 mg by mouth daily.     clopidogrel (PLAVIX) 75 MG tablet Take 1 tablet (75 mg total) by mouth daily.     diclofenac Sodium (VOLTAREN) 1 % GEL Apply 2 g topically 4 (four) times daily.     fluvoxaMINE (LUVOX) 100 MG tablet Take 1 tablet (100 mg total) by mouth daily.     HYDROcodone-acetaminophen (NORCO) 7.5-325 MG tablet Take 1 tablet by mouth every 6 (six) hours as needed for severe pain (pain score 7-10). 20 tablet 0   insulin glargine-yfgn (SEMGLEE) 100 UNIT/ML injection Inject 0.1 mLs (10 Units total) into the skin 2 (two) times daily.     melatonin 10 MG TABS Take 10 mg by mouth at bedtime.     metoprolol succinate (TOPROL-XL) 100 MG 24 hr tablet Take 1 tablet (100 mg total) by mouth daily. Take with or immediately following a meal.     nitroGLYCERIN (NITROSTAT) 0.4 MG SL tablet Place 1 tablet (0.4 mg total) under the tongue every 5 (five) minutes x 3 doses as needed for chest pain.     omega-3  acid ethyl esters (LOVAZA) 1 g capsule Take 1 capsule (1 g total) by mouth 2 (two) times daily.     ondansetron (ZOFRAN) 4 MG/2ML SOLN injection Inject 2 mLs (4 mg total) into the vein every 6 (six) hours as needed for nausea.     oxyCODONE (OXY IR/ROXICODONE) 5 MG immediate release tablet Take 5 mg by mouth 2 (two) times daily as needed.     oxyCODONE-acetaminophen (PERCOCET) 10-325 MG tablet Take 1 tablet by mouth every 4 (four) hours as needed for pain.     pantoprazole (PROTONIX) 40 MG tablet Take 40 mg by mouth daily.     polyethylene glycol (MIRALAX / GLYCOLAX) 17 g packet Take 17 g by mouth daily as needed for mild constipation, moderate constipation or severe constipation.     traZODone (DESYREL) 150 MG tablet Take 1 tablet (150 mg total) by mouth at bedtime.     No current facility-administered medications on file prior to visit.    Allergies:   Allergies  Allergen Reactions   Iodinated Contrast Media Hives and Itching   Penicillins Other (See Comments)    Unknown reaction in childhood - patient reported hives Tolerated Augmentin challenge 04/16/23      OBJECTIVE:  Physical Exam  There were no vitals filed for this visit. There is no height or weight on file to calculate BMI. No results found.      No data to display           General: well developed, well nourished, seated, in no evident distress Head: head normocephalic and atraumatic.   Neck: supple with no carotid or supraclavicular bruits Cardiovascular: regular rate and rhythm, no murmurs Musculoskeletal: no deformity Skin:  no rash/petichiae Vascular:  Normal pulses all extremities   Neurologic Exam Mental Status: Awake and fully alert.  Fluent speech and language.  Oriented to place and time. Recent and remote memory intact. Attention span, concentration and fund of knowledge appropriate. Mood and affect appropriate.  Cranial Nerves: Fundoscopic exam reveals sharp disc margins.  Pupils equal, briskly  reactive to light. Extraocular movements full without nystagmus. Visual fields full to confrontation. Hearing intact. Facial sensation intact. Face, tongue, palate moves normally and symmetrically.  Motor: Normal bulk and tone. Normal strength in all tested extremity muscles Sensory.: intact to touch , pinprick , position and vibratory sensation.  Coordination: Rapid alternating movements normal in all extremities. Finger-to-nose and heel-to-shin performed accurately bilaterally. Gait and Station: Arises from chair without difficulty. Stance is normal. Gait demonstrates normal stride length and balance with ***. Tandem walk and heel toe ***.  Reflexes: 1+ and symmetric.    NIHSS  *** Modified Rankin  ***    ASSESSMENT: Michael Rose is a 72 y.o. year old male presenting to ER 11/ with shob. Elevated troponin. Severe CAD found s/p cath. One day after cath found to have slurred speech, left upper ext ataxia and left facial droop. Vascular risk factors include previous stroke, HLD, CAD/ MI s/p PCI.     PLAN:  Stroke: Scattered post circulation infarcts, embolic pattern, could be related to cardiac cath procedure versus large vessel disease in the setting of procedure/hypotension: Residual deficit: ***. Continue aspirin 81 mg daily and clopidogrel 75 mg daily  and atorvastatin 80mg  daily for secondary stroke prevention.  Discussed secondary stroke prevention measures and importance of close PCP follow up for aggressive stroke risk factor management. I have gone over the pathophysiology of stroke, warning signs and symptoms, risk factors and their management in some detail with instructions to go to the closest emergency room for symptoms of concern. HTN: BP goal <130/90.  Stable on metoprolol per PCP HLD: LDL goal <70. Recent LDL 51. Continue atorvastatin 80mg  per PCP. DMII: A1c goal<7.0. Recent A1c 5.9. Controlled on insulin. Continue to monitor per PCP.  Anemia: need colonoscopy once medically  stable for eval  SI/hx depression: continue close follow up with PCP.    Follow up in *** or call earlier if needed   CC:  [facility] provider: Dr. Pearlean Brownie PCP: [name], MD    I spent *** minutes of face-to-face and non-face-to-face time with patient.  This included previsit chart review including review of recent hospitalization, lab review, study review, order entry, electronic health record documentation, patient education regarding recent stroke including etiology, secondary stroke prevention measures and importance of managing stroke risk factors, residual deficits and typical recovery time and answered all other questions to patient satisfaction   Shawnie Dapper, [facility]  [facility] [address] [place], [place] [address]  Phone [phone] Fax [phone] Note: This document was prepared with digital dictation and possible smart phrase technology. Any transcriptional errors that result from this process are unintentional.

## 2023-05-24 ENCOUNTER — Inpatient Hospital Stay: Payer: PPO | Admitting: Family Medicine

## 2023-05-24 DIAGNOSIS — I6389 Other cerebral infarction: Secondary | ICD-10-CM

## 2023-06-09 ENCOUNTER — Telehealth: Payer: Self-pay | Admitting: Family Medicine

## 2023-06-09 NOTE — Telephone Encounter (Signed)
 Pt's wife canceled appt due to pt passing away

## 2023-06-29 ENCOUNTER — Ambulatory Visit: Payer: PPO | Admitting: Nurse Practitioner

## 2023-06-30 ENCOUNTER — Inpatient Hospital Stay: Payer: PPO | Admitting: Family Medicine

## 2023-07-03 DEATH — deceased
